# Patient Record
Sex: Female | Born: 1935 | Race: White | Hispanic: No | Marital: Married | Smoking: Never smoker
Health system: Southern US, Community
[De-identification: ages and names within clinical notes are randomized; demographics above are authoritative.]

## PROBLEM LIST (undated history)

## (undated) DIAGNOSIS — K219 Gastro-esophageal reflux disease without esophagitis: Secondary | ICD-10-CM

## (undated) DIAGNOSIS — F0390 Unspecified dementia without behavioral disturbance: Secondary | ICD-10-CM

## (undated) DIAGNOSIS — E079 Disorder of thyroid, unspecified: Secondary | ICD-10-CM

## (undated) DIAGNOSIS — I712 Thoracic aortic aneurysm, without rupture, unspecified: Secondary | ICD-10-CM

## (undated) DIAGNOSIS — I639 Cerebral infarction, unspecified: Secondary | ICD-10-CM

## (undated) DIAGNOSIS — E785 Hyperlipidemia, unspecified: Secondary | ICD-10-CM

## (undated) DIAGNOSIS — I10 Essential (primary) hypertension: Secondary | ICD-10-CM

## (undated) DIAGNOSIS — I4891 Unspecified atrial fibrillation: Secondary | ICD-10-CM

## (undated) DIAGNOSIS — I2510 Atherosclerotic heart disease of native coronary artery without angina pectoris: Secondary | ICD-10-CM

## (undated) DIAGNOSIS — M8580 Other specified disorders of bone density and structure, unspecified site: Secondary | ICD-10-CM

## (undated) DIAGNOSIS — D649 Anemia, unspecified: Secondary | ICD-10-CM

## (undated) HISTORY — DX: Gastro-esophageal reflux disease without esophagitis: K21.9

## (undated) HISTORY — PX: CHOLECYSTECTOMY: SHX55

## (undated) HISTORY — DX: Thoracic aortic aneurysm, without rupture, unspecified: I71.20

## (undated) HISTORY — DX: Cerebral infarction, unspecified: I63.9

## (undated) HISTORY — DX: Other specified disorders of bone density and structure, unspecified site: M85.80

## (undated) HISTORY — DX: Thoracic aortic aneurysm, without rupture: I71.2

## (undated) HISTORY — DX: Disorder of thyroid, unspecified: E07.9

## (undated) HISTORY — DX: Unspecified atrial fibrillation: I48.91

## (undated) HISTORY — DX: Anemia, unspecified: D64.9

## (undated) HISTORY — PX: THORACIC AORTIC ANEURYSM REPAIR: SHX799

## (undated) HISTORY — DX: Essential (primary) hypertension: I10

## (undated) HISTORY — DX: Hyperlipidemia, unspecified: E78.5

## (undated) HISTORY — DX: Atherosclerotic heart disease of native coronary artery without angina pectoris: I25.10

---
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
## 1969-08-03 HISTORY — PX: TUBAL LIGATION: SHX77

## 1977-08-03 HISTORY — PX: ELBOW SURGERY: SHX618

## 1997-10-16 ENCOUNTER — Inpatient Hospital Stay (HOSPITAL_COMMUNITY): Admission: AD | Admit: 1997-10-16 | Discharge: 1997-10-16 | Payer: Self-pay | Admitting: *Deleted

## 1997-11-16 ENCOUNTER — Other Ambulatory Visit: Admission: RE | Admit: 1997-11-16 | Discharge: 1997-11-16 | Payer: Self-pay | Admitting: *Deleted

## 1997-11-19 ENCOUNTER — Other Ambulatory Visit: Admission: RE | Admit: 1997-11-19 | Discharge: 1997-11-19 | Payer: Self-pay | Admitting: *Deleted

## 1997-12-17 ENCOUNTER — Other Ambulatory Visit: Admission: RE | Admit: 1997-12-17 | Discharge: 1997-12-17 | Payer: Self-pay | Admitting: *Deleted

## 1998-01-16 ENCOUNTER — Other Ambulatory Visit: Admission: RE | Admit: 1998-01-16 | Discharge: 1998-01-16 | Payer: Self-pay | Admitting: *Deleted

## 1998-01-29 ENCOUNTER — Other Ambulatory Visit: Admission: RE | Admit: 1998-01-29 | Discharge: 1998-01-29 | Payer: Self-pay | Admitting: *Deleted

## 1999-02-17 ENCOUNTER — Encounter: Payer: Self-pay | Admitting: *Deleted

## 1999-02-17 ENCOUNTER — Emergency Department (HOSPITAL_COMMUNITY): Admission: EM | Admit: 1999-02-17 | Discharge: 1999-02-17 | Payer: Self-pay | Admitting: Emergency Medicine

## 1999-04-16 ENCOUNTER — Other Ambulatory Visit: Admission: RE | Admit: 1999-04-16 | Discharge: 1999-04-16 | Payer: Self-pay | Admitting: *Deleted

## 2000-04-16 ENCOUNTER — Other Ambulatory Visit: Admission: RE | Admit: 2000-04-16 | Discharge: 2000-04-16 | Payer: Self-pay | Admitting: *Deleted

## 2001-10-03 ENCOUNTER — Other Ambulatory Visit: Admission: RE | Admit: 2001-10-03 | Discharge: 2001-10-03 | Payer: Self-pay | Admitting: *Deleted

## 2002-11-01 ENCOUNTER — Other Ambulatory Visit: Admission: RE | Admit: 2002-11-01 | Discharge: 2002-11-01 | Payer: Self-pay | Admitting: *Deleted

## 2003-02-23 ENCOUNTER — Ambulatory Visit (HOSPITAL_COMMUNITY): Admission: RE | Admit: 2003-02-23 | Discharge: 2003-02-23 | Payer: Self-pay | Admitting: Gastroenterology

## 2003-02-23 ENCOUNTER — Encounter (INDEPENDENT_AMBULATORY_CARE_PROVIDER_SITE_OTHER): Payer: Self-pay | Admitting: Specialist

## 2003-05-17 ENCOUNTER — Encounter: Payer: Self-pay | Admitting: Otolaryngology

## 2003-05-17 ENCOUNTER — Ambulatory Visit (HOSPITAL_COMMUNITY): Admission: RE | Admit: 2003-05-17 | Discharge: 2003-05-17 | Payer: Self-pay | Admitting: Otolaryngology

## 2003-05-30 ENCOUNTER — Ambulatory Visit (HOSPITAL_COMMUNITY): Admission: RE | Admit: 2003-05-30 | Discharge: 2003-05-30 | Payer: Self-pay | Admitting: Otolaryngology

## 2003-05-30 ENCOUNTER — Encounter (INDEPENDENT_AMBULATORY_CARE_PROVIDER_SITE_OTHER): Payer: Self-pay | Admitting: *Deleted

## 2005-03-31 ENCOUNTER — Other Ambulatory Visit: Admission: RE | Admit: 2005-03-31 | Discharge: 2005-03-31 | Payer: Self-pay | Admitting: *Deleted

## 2006-05-10 ENCOUNTER — Other Ambulatory Visit: Admission: RE | Admit: 2006-05-10 | Discharge: 2006-05-10 | Payer: Self-pay | Admitting: Obstetrics and Gynecology

## 2007-08-02 ENCOUNTER — Encounter: Payer: Self-pay | Admitting: Family Medicine

## 2008-08-04 LAB — HM COLONOSCOPY: HM Colonoscopy: NORMAL

## 2008-08-06 ENCOUNTER — Encounter: Payer: Self-pay | Admitting: Family Medicine

## 2008-08-13 ENCOUNTER — Encounter: Payer: Self-pay | Admitting: Family Medicine

## 2009-08-04 LAB — HM MAMMOGRAPHY: HM Mammogram: NORMAL

## 2009-08-07 ENCOUNTER — Encounter: Payer: Self-pay | Admitting: Family Medicine

## 2009-08-20 ENCOUNTER — Ambulatory Visit: Payer: Self-pay | Admitting: Family Medicine

## 2009-08-20 DIAGNOSIS — M899 Disorder of bone, unspecified: Secondary | ICD-10-CM | POA: Insufficient documentation

## 2009-08-20 DIAGNOSIS — E119 Type 2 diabetes mellitus without complications: Secondary | ICD-10-CM

## 2009-08-20 DIAGNOSIS — E785 Hyperlipidemia, unspecified: Secondary | ICD-10-CM

## 2009-08-20 DIAGNOSIS — M949 Disorder of cartilage, unspecified: Secondary | ICD-10-CM

## 2009-08-20 DIAGNOSIS — K219 Gastro-esophageal reflux disease without esophagitis: Secondary | ICD-10-CM | POA: Insufficient documentation

## 2009-08-20 DIAGNOSIS — E039 Hypothyroidism, unspecified: Secondary | ICD-10-CM

## 2009-08-20 DIAGNOSIS — I10 Essential (primary) hypertension: Secondary | ICD-10-CM

## 2009-08-20 LAB — CONVERTED CEMR LAB: Cholesterol, target level: 200 mg/dL

## 2009-08-21 ENCOUNTER — Telehealth: Payer: Self-pay | Admitting: Family Medicine

## 2009-08-21 ENCOUNTER — Ambulatory Visit: Payer: Self-pay | Admitting: Family Medicine

## 2009-08-21 LAB — CONVERTED CEMR LAB
AST: 26 units/L (ref 0–37)
Alkaline Phosphatase: 40 units/L (ref 39–117)
BUN: 20 mg/dL (ref 6–23)
CO2: 30 meq/L (ref 19–32)
Chloride: 104 meq/L (ref 96–112)
Creatinine, Ser: 0.9 mg/dL (ref 0.4–1.2)
Direct LDL: 103.6 mg/dL
Potassium: 4 meq/L (ref 3.5–5.1)
TSH: 1.29 microintl units/mL (ref 0.35–5.50)
Total Bilirubin: 0.8 mg/dL (ref 0.3–1.2)
Total CHOL/HDL Ratio: 6

## 2009-09-16 ENCOUNTER — Telehealth: Payer: Self-pay | Admitting: Family Medicine

## 2010-03-20 ENCOUNTER — Inpatient Hospital Stay (HOSPITAL_COMMUNITY): Admission: EM | Admit: 2010-03-20 | Discharge: 2010-03-23 | Payer: Self-pay | Admitting: Emergency Medicine

## 2010-03-20 ENCOUNTER — Encounter (INDEPENDENT_AMBULATORY_CARE_PROVIDER_SITE_OTHER): Payer: Self-pay | Admitting: Cardiovascular Disease

## 2010-03-21 ENCOUNTER — Telehealth: Payer: Self-pay | Admitting: Family Medicine

## 2010-04-17 ENCOUNTER — Encounter (HOSPITAL_COMMUNITY): Admission: RE | Admit: 2010-04-17 | Discharge: 2010-05-02 | Payer: Self-pay | Admitting: Cardiovascular Disease

## 2010-04-18 ENCOUNTER — Telehealth: Payer: Self-pay | Admitting: Family Medicine

## 2010-05-01 ENCOUNTER — Ambulatory Visit: Payer: Self-pay | Admitting: Family Medicine

## 2010-05-01 DIAGNOSIS — Z951 Presence of aortocoronary bypass graft: Secondary | ICD-10-CM

## 2010-05-01 LAB — CONVERTED CEMR LAB: Blood Glucose, Fingerstick: 166

## 2010-05-05 ENCOUNTER — Telehealth: Payer: Self-pay | Admitting: Family Medicine

## 2010-05-20 ENCOUNTER — Ambulatory Visit: Payer: Self-pay | Admitting: Surgery

## 2010-06-04 ENCOUNTER — Encounter: Payer: Self-pay | Admitting: Family Medicine

## 2010-06-11 ENCOUNTER — Encounter: Payer: Self-pay | Admitting: Family Medicine

## 2010-06-17 ENCOUNTER — Inpatient Hospital Stay (HOSPITAL_COMMUNITY)
Admission: EM | Admit: 2010-06-17 | Discharge: 2010-07-15 | Payer: Self-pay | Source: Home / Self Care | Attending: Surgery | Admitting: Surgery

## 2010-06-18 ENCOUNTER — Ambulatory Visit: Payer: Self-pay | Admitting: Surgery

## 2010-06-18 HISTORY — PX: CORONARY ARTERY BYPASS GRAFT: SHX141

## 2010-06-18 HISTORY — PX: CARDIAC CATHETERIZATION: SHX172

## 2010-06-19 ENCOUNTER — Encounter: Payer: Self-pay | Admitting: Surgery

## 2010-06-27 ENCOUNTER — Encounter: Payer: Self-pay | Admitting: Cardiothoracic Surgery

## 2010-07-04 DIAGNOSIS — F329 Major depressive disorder, single episode, unspecified: Secondary | ICD-10-CM

## 2010-07-18 ENCOUNTER — Inpatient Hospital Stay (HOSPITAL_COMMUNITY)
Admission: EM | Admit: 2010-07-18 | Discharge: 2010-08-01 | Payer: Self-pay | Source: Home / Self Care | Attending: Internal Medicine | Admitting: Internal Medicine

## 2010-07-24 ENCOUNTER — Encounter: Payer: Self-pay | Admitting: Pulmonary Disease

## 2010-07-29 ENCOUNTER — Ambulatory Visit: Payer: Self-pay | Admitting: Surgery

## 2010-08-05 ENCOUNTER — Emergency Department (HOSPITAL_COMMUNITY)
Admission: EM | Admit: 2010-08-05 | Discharge: 2010-08-05 | Payer: Self-pay | Source: Home / Self Care | Admitting: Emergency Medicine

## 2010-08-06 ENCOUNTER — Inpatient Hospital Stay (HOSPITAL_COMMUNITY)
Admission: EM | Admit: 2010-08-06 | Discharge: 2010-08-11 | Disposition: A | Discharge status: Other Acute Inpatient Hospital | Payer: Self-pay | Source: Home / Self Care | Attending: Internal Medicine | Admitting: Internal Medicine

## 2010-08-06 LAB — COMPREHENSIVE METABOLIC PANEL
ALT: 8 U/L (ref 0–35)
AST: 18 U/L (ref 0–37)
Albumin: 2.2 g/dL — ABNORMAL LOW (ref 3.5–5.2)
Alkaline Phosphatase: 79 U/L (ref 39–117)
BUN: 20 mg/dL (ref 6–23)
CO2: 25 mEq/L (ref 19–32)
Calcium: 8.5 mg/dL (ref 8.4–10.5)
Chloride: 101 mEq/L (ref 96–112)
Creatinine, Ser: 1.53 mg/dL — ABNORMAL HIGH (ref 0.4–1.2)
GFR calc Af Amer: 40 mL/min — ABNORMAL LOW (ref >60)
GFR calc non Af Amer: 33 mL/min — ABNORMAL LOW (ref >60)
Glucose, Bld: 62 mg/dL — ABNORMAL LOW (ref 70–99)
Potassium: 4.3 mEq/L (ref 3.5–5.1)
Sodium: 135 mEq/L (ref 135–145)
Total Bilirubin: 0.5 mg/dL (ref 0.3–1.2)
Total Protein: 7.1 g/dL (ref 6.0–8.3)

## 2010-08-06 LAB — DIFFERENTIAL
Basophils Absolute: 0 10*3/uL (ref 0.0–0.1)
Basophils Relative: 0 % (ref 0–1)
Eosinophils Absolute: 0.3 10*3/uL (ref 0.0–0.7)
Eosinophils Relative: 2 % (ref 0–5)
Lymphocytes Relative: 12 % (ref 12–46)
Lymphs Abs: 1.8 10*3/uL (ref 0.7–4.0)
Monocytes Absolute: 1.9 10*3/uL — ABNORMAL HIGH (ref 0.1–1.0)
Monocytes Relative: 12 % (ref 3–12)
Neutro Abs: 11.7 10*3/uL — ABNORMAL HIGH (ref 1.7–7.7)
Neutrophils Relative %: 75 % (ref 43–77)

## 2010-08-06 LAB — URINALYSIS, ROUTINE W REFLEX MICROSCOPIC
Bilirubin Urine: NEGATIVE
Hemoglobin, Urine: NEGATIVE
Ketones, ur: NEGATIVE mg/dL
Nitrite: NEGATIVE
Protein, ur: NEGATIVE mg/dL
Specific Gravity, Urine: 1.013 (ref 1.005–1.030)
Urine Glucose, Fasting: NEGATIVE mg/dL
Urobilinogen, UA: 0.2 mg/dL (ref 0.0–1.0)
pH: 5.5 (ref 5.0–8.0)

## 2010-08-06 LAB — CBC
HCT: 25.2 % — ABNORMAL LOW (ref 36.0–46.0)
HCT: 28.1 % — ABNORMAL LOW (ref 36.0–46.0)
Hemoglobin: 7.8 g/dL — ABNORMAL LOW (ref 12.0–15.0)
Hemoglobin: 9.2 g/dL — ABNORMAL LOW (ref 12.0–15.0)
MCH: 25.7 pg — ABNORMAL LOW (ref 26.0–34.0)
MCH: 27 pg (ref 26.0–34.0)
MCHC: 31 g/dL (ref 30.0–36.0)
MCHC: 32.7 g/dL (ref 30.0–36.0)
MCV: 82.9 fL (ref 78.0–100.0)
MCV: 82.4 fL (ref 78.0–100.0)
Platelets: 465 10*3/uL — ABNORMAL HIGH (ref 150–400)
Platelets: 363 10*3/uL (ref 150–400)
RBC: 3.04 MIL/uL — ABNORMAL LOW (ref 3.87–5.11)
RBC: 3.41 MIL/uL — ABNORMAL LOW (ref 3.87–5.11)
RDW: 18.7 % — ABNORMAL HIGH (ref 11.5–15.5)
RDW: 17.7 % — ABNORMAL HIGH (ref 11.5–15.5)
WBC: 15.7 10*3/uL — ABNORMAL HIGH (ref 4.0–10.5)
WBC: 10 10*3/uL (ref 4.0–10.5)

## 2010-08-06 LAB — GLUCOSE, CAPILLARY
Glucose-Capillary: 103 mg/dL — ABNORMAL HIGH (ref 70–99)
Glucose-Capillary: 43 mg/dL — CL (ref 70–99)
Glucose-Capillary: 95 mg/dL (ref 70–99)
Glucose-Capillary: 63 mg/dL — ABNORMAL LOW (ref 70–99)
Glucose-Capillary: 64 mg/dL — ABNORMAL LOW (ref 70–99)
Glucose-Capillary: 110 mg/dL — ABNORMAL HIGH (ref 70–99)
Glucose-Capillary: 69 mg/dL — ABNORMAL LOW (ref 70–99)
Glucose-Capillary: 82 mg/dL (ref 70–99)
Glucose-Capillary: 94 mg/dL (ref 70–99)
Glucose-Capillary: 104 mg/dL — ABNORMAL HIGH (ref 70–99)
Glucose-Capillary: 143 mg/dL — ABNORMAL HIGH (ref 70–99)

## 2010-08-06 LAB — ABO/RH: ABO/RH(D): A POS

## 2010-08-06 LAB — LIPASE, BLOOD: Lipase: 56 U/L (ref 11–59)

## 2010-08-06 LAB — URINE MICROSCOPIC-ADD ON

## 2010-08-07 ENCOUNTER — Ambulatory Visit (HOSPITAL_COMMUNITY): Admission: RE | Admit: 2010-08-07 | Payer: Self-pay | Source: Home / Self Care | Admitting: Internal Medicine

## 2010-08-07 LAB — COMPREHENSIVE METABOLIC PANEL
ALT: 10 U/L (ref 0–35)
AST: 16 U/L (ref 0–37)
Albumin: 2.1 g/dL — ABNORMAL LOW (ref 3.5–5.2)
Alkaline Phosphatase: 76 U/L (ref 39–117)
BUN: 23 mg/dL (ref 6–23)
CO2: 22 mEq/L (ref 19–32)
Calcium: 8.5 mg/dL (ref 8.4–10.5)
Chloride: 101 mEq/L (ref 96–112)
Creatinine, Ser: 1.59 mg/dL — ABNORMAL HIGH (ref 0.4–1.2)
GFR calc Af Amer: 39 mL/min — ABNORMAL LOW (ref >60)
GFR calc non Af Amer: 32 mL/min — ABNORMAL LOW (ref >60)
Glucose, Bld: 382 mg/dL — ABNORMAL HIGH (ref 70–99)
Potassium: 5.1 mEq/L (ref 3.5–5.1)
Sodium: 132 mEq/L — ABNORMAL LOW (ref 135–145)
Total Bilirubin: 0.7 mg/dL (ref 0.3–1.2)
Total Protein: 6.9 g/dL (ref 6.0–8.3)

## 2010-08-07 LAB — GLUCOSE, CAPILLARY
Glucose-Capillary: 322 mg/dL — ABNORMAL HIGH (ref 70–99)
Glucose-Capillary: 438 mg/dL — ABNORMAL HIGH (ref 70–99)
Glucose-Capillary: 402 mg/dL — ABNORMAL HIGH (ref 70–99)
Glucose-Capillary: 504 mg/dL — ABNORMAL HIGH (ref 70–99)
Glucose-Capillary: 533 mg/dL — ABNORMAL HIGH (ref 70–99)

## 2010-08-07 LAB — CROSSMATCH
ABO/RH(D): A POS
Antibody Screen: NEGATIVE
Unit division: 0
Unit division: 0

## 2010-08-07 LAB — CBC
HCT: 29.8 % — ABNORMAL LOW (ref 36.0–46.0)
Hemoglobin: 9.4 g/dL — ABNORMAL LOW (ref 12.0–15.0)
MCH: 26.2 pg (ref 26.0–34.0)
MCHC: 31.5 g/dL (ref 30.0–36.0)
MCV: 83 fL (ref 78.0–100.0)
Platelets: 394 10*3/uL (ref 150–400)
RBC: 3.59 MIL/uL — ABNORMAL LOW (ref 3.87–5.11)
RDW: 17.8 % — ABNORMAL HIGH (ref 11.5–15.5)
WBC: 7.7 10*3/uL (ref 4.0–10.5)

## 2010-08-07 LAB — DIFFERENTIAL
Basophils Absolute: 0 10*3/uL (ref 0.0–0.1)
Basophils Relative: 0 % (ref 0–1)
Eosinophils Absolute: 0 10*3/uL (ref 0.0–0.7)
Eosinophils Relative: 0 % (ref 0–5)
Lymphocytes Relative: 7 % — ABNORMAL LOW (ref 12–46)
Lymphs Abs: 0.5 10*3/uL — ABNORMAL LOW (ref 0.7–4.0)
Monocytes Absolute: 0.3 10*3/uL (ref 0.1–1.0)
Monocytes Relative: 4 % (ref 3–12)
Neutro Abs: 6.9 10*3/uL (ref 1.7–7.7)
Neutrophils Relative %: 90 % — ABNORMAL HIGH (ref 43–77)

## 2010-08-07 LAB — CORTISOL: Cortisol, Plasma: 59.9 ug/dL

## 2010-08-08 LAB — CBC
HCT: 29.4 % — ABNORMAL LOW (ref 36.0–46.0)
Hemoglobin: 9.4 g/dL — ABNORMAL LOW (ref 12.0–15.0)
MCH: 26.8 pg (ref 26.0–34.0)
MCHC: 32 g/dL (ref 30.0–36.0)
MCV: 83.8 fL (ref 78.0–100.0)
Platelets: 427 10*3/uL — ABNORMAL HIGH (ref 150–400)
RBC: 3.51 MIL/uL — ABNORMAL LOW (ref 3.87–5.11)
RDW: 17.4 % — ABNORMAL HIGH (ref 11.5–15.5)
WBC: 14.4 10*3/uL — ABNORMAL HIGH (ref 4.0–10.5)

## 2010-08-08 LAB — COMPREHENSIVE METABOLIC PANEL
ALT: 9 U/L (ref 0–35)
AST: 16 U/L (ref 0–37)
Albumin: 2.2 g/dL — ABNORMAL LOW (ref 3.5–5.2)
Alkaline Phosphatase: 63 U/L (ref 39–117)
BUN: 30 mg/dL — ABNORMAL HIGH (ref 6–23)
CO2: 25 mEq/L (ref 19–32)
Calcium: 9 mg/dL (ref 8.4–10.5)
Chloride: 100 mEq/L (ref 96–112)
Creatinine, Ser: 1.62 mg/dL — ABNORMAL HIGH (ref 0.4–1.2)
GFR calc Af Amer: 38 mL/min — ABNORMAL LOW (ref >60)
GFR calc non Af Amer: 31 mL/min — ABNORMAL LOW (ref >60)
Glucose, Bld: 259 mg/dL — ABNORMAL HIGH (ref 70–99)
Potassium: 5.2 mEq/L — ABNORMAL HIGH (ref 3.5–5.1)
Sodium: 133 mEq/L — ABNORMAL LOW (ref 135–145)
Total Bilirubin: 0.3 mg/dL (ref 0.3–1.2)
Total Protein: 7 g/dL (ref 6.0–8.3)

## 2010-08-08 LAB — GLUCOSE, CAPILLARY
Glucose-Capillary: 151 mg/dL — ABNORMAL HIGH (ref 70–99)
Glucose-Capillary: 173 mg/dL — ABNORMAL HIGH (ref 70–99)
Glucose-Capillary: 354 mg/dL — ABNORMAL HIGH (ref 70–99)

## 2010-08-18 LAB — BASIC METABOLIC PANEL
BUN: 43 mg/dL — ABNORMAL HIGH (ref 6–23)
BUN: 38 mg/dL — ABNORMAL HIGH (ref 6–23)
BUN: 32 mg/dL — ABNORMAL HIGH (ref 6–23)
CO2: 27 mEq/L (ref 19–32)
CO2: 28 mEq/L (ref 19–32)
CO2: 27 mEq/L (ref 19–32)
Calcium: 9 mg/dL (ref 8.4–10.5)
Calcium: 9.2 mg/dL (ref 8.4–10.5)
Calcium: 9 mg/dL (ref 8.4–10.5)
Chloride: 100 mEq/L (ref 96–112)
Chloride: 102 mEq/L (ref 96–112)
Chloride: 101 mEq/L (ref 96–112)
Creatinine, Ser: 1.95 mg/dL — ABNORMAL HIGH (ref 0.4–1.2)
Creatinine, Ser: 1.69 mg/dL — ABNORMAL HIGH (ref 0.4–1.2)
Creatinine, Ser: 1.53 mg/dL — ABNORMAL HIGH (ref 0.4–1.2)
GFR calc Af Amer: 31 mL/min — ABNORMAL LOW (ref >60)
GFR calc Af Amer: 36 mL/min — ABNORMAL LOW (ref >60)
GFR calc Af Amer: 40 mL/min — ABNORMAL LOW (ref >60)
GFR calc non Af Amer: 25 mL/min — ABNORMAL LOW (ref >60)
GFR calc non Af Amer: 30 mL/min — ABNORMAL LOW (ref >60)
GFR calc non Af Amer: 33 mL/min — ABNORMAL LOW (ref >60)
Glucose, Bld: 93 mg/dL (ref 70–99)
Glucose, Bld: 98 mg/dL (ref 70–99)
Glucose, Bld: 97 mg/dL (ref 70–99)
Potassium: 5.1 mEq/L (ref 3.5–5.1)
Potassium: 4.1 mEq/L (ref 3.5–5.1)
Potassium: 3.9 mEq/L (ref 3.5–5.1)
Sodium: 137 mEq/L (ref 135–145)
Sodium: 139 mEq/L (ref 135–145)
Sodium: 138 mEq/L (ref 135–145)

## 2010-08-18 LAB — CBC
HCT: 30.7 % — ABNORMAL LOW (ref 36.0–46.0)
HCT: 31.4 % — ABNORMAL LOW (ref 36.0–46.0)
HCT: 30.9 % — ABNORMAL LOW (ref 36.0–46.0)
Hemoglobin: 9.4 g/dL — ABNORMAL LOW (ref 12.0–15.0)
Hemoglobin: 9.8 g/dL — ABNORMAL LOW (ref 12.0–15.0)
Hemoglobin: 9.6 g/dL — ABNORMAL LOW (ref 12.0–15.0)
MCH: 25.7 pg — ABNORMAL LOW (ref 26.0–34.0)
MCH: 26.4 pg (ref 26.0–34.0)
MCH: 25.9 pg — ABNORMAL LOW (ref 26.0–34.0)
MCHC: 30.6 g/dL (ref 30.0–36.0)
MCHC: 31.2 g/dL (ref 30.0–36.0)
MCHC: 31.1 g/dL (ref 30.0–36.0)
MCV: 83.9 fL (ref 78.0–100.0)
MCV: 84.6 fL (ref 78.0–100.0)
MCV: 83.5 fL (ref 78.0–100.0)
Platelets: 429 10*3/uL — ABNORMAL HIGH (ref 150–400)
Platelets: 406 10*3/uL — ABNORMAL HIGH (ref 150–400)
Platelets: 388 10*3/uL (ref 150–400)
RBC: 3.66 MIL/uL — ABNORMAL LOW (ref 3.87–5.11)
RBC: 3.71 MIL/uL — ABNORMAL LOW (ref 3.87–5.11)
RBC: 3.7 MIL/uL — ABNORMAL LOW (ref 3.87–5.11)
RDW: 17.9 % — ABNORMAL HIGH (ref 11.5–15.5)
RDW: 17.9 % — ABNORMAL HIGH (ref 11.5–15.5)
RDW: 17.9 % — ABNORMAL HIGH (ref 11.5–15.5)
WBC: 12.2 10*3/uL — ABNORMAL HIGH (ref 4.0–10.5)
WBC: 10.7 10*3/uL — ABNORMAL HIGH (ref 4.0–10.5)
WBC: 8.2 10*3/uL (ref 4.0–10.5)

## 2010-08-18 LAB — GLUCOSE, CAPILLARY
Glucose-Capillary: 158 mg/dL — ABNORMAL HIGH (ref 70–99)
Glucose-Capillary: 92 mg/dL (ref 70–99)
Glucose-Capillary: 158 mg/dL — ABNORMAL HIGH (ref 70–99)
Glucose-Capillary: 123 mg/dL — ABNORMAL HIGH (ref 70–99)
Glucose-Capillary: 93 mg/dL (ref 70–99)
Glucose-Capillary: 170 mg/dL — ABNORMAL HIGH (ref 70–99)
Glucose-Capillary: 140 mg/dL — ABNORMAL HIGH (ref 70–99)
Glucose-Capillary: 103 mg/dL — ABNORMAL HIGH (ref 70–99)
Glucose-Capillary: 217 mg/dL — ABNORMAL HIGH (ref 70–99)

## 2010-08-18 LAB — CULTURE, BLOOD (ROUTINE X 2)
Culture  Setup Time: 201201050128
Culture  Setup Time: 201201050127
Culture: NO GROWTH
Culture: NO GROWTH

## 2010-08-18 LAB — LIPASE, BLOOD: Lipase: 81 U/L — ABNORMAL HIGH (ref 11–59)

## 2010-08-20 ENCOUNTER — Ambulatory Visit
Admission: RE | Admit: 2010-08-20 | Discharge: 2010-08-20 | Payer: Self-pay | Source: Home / Self Care | Attending: Internal Medicine | Admitting: Internal Medicine

## 2010-08-20 DIAGNOSIS — J90 Pleural effusion, not elsewhere classified: Secondary | ICD-10-CM | POA: Insufficient documentation

## 2010-08-21 ENCOUNTER — Encounter: Payer: Self-pay | Admitting: Internal Medicine

## 2010-08-22 ENCOUNTER — Encounter: Payer: Self-pay | Admitting: Family Medicine

## 2010-08-24 ENCOUNTER — Encounter: Payer: Self-pay | Admitting: Emergency Medicine

## 2010-08-24 ENCOUNTER — Encounter: Payer: Self-pay | Admitting: Surgery

## 2010-08-28 LAB — URINE CULTURE
Colony Count: NO GROWTH
Culture  Setup Time: 201201041059
Culture: NO GROWTH

## 2010-08-28 LAB — SAMPLE TO BLOOD BANK

## 2010-08-29 NOTE — H&P (Signed)
NAMELALITA, EBEL                ACCOUNT NO.:  [id number]  MEDICAL RECORD NO.:  [id number]          PATIENT TYPE:  OBV  LOCATION:  0102                         FACILITY:  [facility]  PHYSICIAN:  Massie Maroon, MD        DATE OF BIRTH:  March 03, 1936  DATE OF ADMISSION:  08/06/2010 DATE OF DISCHARGE:                             HISTORY & PHYSICAL   CARDIOLOGIST:  Dr. Nanetta Batty, [facility] and Dr. Rexanne Mano.  CHIEF COMPLAINT:  I am hypoglycemic.  HISTORY OF PRESENT ILLNESS:  This is a 75 year old female with a history of diabetes apparently was noted to be hypoglycemic 2 days ago at that point in time Amaryl was discontinued.  It was thought to be secondary to Amaryl and Avelox drug interaction by the ED.  She apparently yesterday also had hypoglycemia which prompted her to return to the ED. She is now being admitted for hypoglycemia.  The patient also noted that she had some right flank pain or discomfort as well, but denies any fever or chills, nausea, vomiting, cough, chest pain, or shortness of breath.  She was noted to have a fungal UTI and imaging is still pending to rule out pyelonephritis.  The patient will be admitted for hypoglycemia and right flank pain as stated above.  PAST MEDICAL HISTORY: 1. Right lower lobe pneumonia, right-sided exudative pleural effusion,     diarrhea, Clostridium difficile negative, CABG x3 vessel, and CAD     status post stent to the LAD. 2. Recent history of acute cholecystitis with cholecystectomy. 3. Aortic sclerosis. 4. Atrial fibrillation. 5. Diabetes type 2. 6. Hypertension. 7. History of acute renal failure due to nephrotoxic effects of     vancomycin. 8. Protein calorie malnutrition. 9. Severe deconditioning. 10.Iron deficiency anemia. 11.Hypothyroidism. 12.History of Bell's palsy. 13.History of colitis.  PAST SURGICAL HISTORY:  On February 23, 2003, EGD minimal hiatal hernia with a Schatzki's ring, on February 23, 2003, colonoscopy one polyp, inflammatory polyp, and a polypoid fragment of benign colonic mucosa with reactive lymphoid aggregate on March 30, 2010, left main normal, LAD had 95% stenosis after the first small to medium sized diagonal branch and the left circumflex had an 80% focal stenosis in the AV groove just past the second obtuse marginal branch, right coronary artery dominant with a low anterior takeoff and free of significant disease.  The patient was stented with a 2.0 x 18 mini-vision bare metal stent in the proximal LAD.  On June 18, 2010, cardiac catheterization with 95% in-stent restenosis of the LAD stent.  On June 20, 2010, CABG x3 using LIMA to LAD with SVG to diagonal branch of the LAD and a second obtuse marginal branch of the left circumflex coronary artery.  Resection and grafting of ascending aortic aneurysm using a 28 mm supracoronary Hemashield tube graft utilizing deep hypothermic circulatory arrest and on June 29, 2010, cholecystostomy.  SOCIAL HISTORY:  The patient is a retired Engineer, civil (consulting).  She does not smoke or drink.  FAMILY HISTORY:  Positive for coronary artery disease.  Her father had CABG x2.  Brother died at age 43  with a heart attack.  There is no history of aneurysm in the family.  SOCIAL HISTORY:  She has two adult children, both nurses.  The daughter is a Publishing rights manager and the son is a Engineer, civil (consulting).  ALLERGIES: 1. IODINE. 2. NEURONTIN. 3. SHELLFISH.  MEDICATIONS: 1. Enteric-coated aspirin 81 mg p.o. daily. 2. Zofran 4 mg p.o. q.6 h. p.r.n. nausea. 3. Tylenol 650 mg p.o. q.6 h. p.r.n. 4. Xanax 0.5 mg p.o. q.h.s. 5. Atorvastatin 20 mg p.o. daily. 6. Carvedilol 12.5 mg p.o. b.i.d. 7. Valsartan 160 mg p.o. daily. 8. Fish oil 1000 mg p.o. daily. 9. Folic acid 1 mg p.o. daily. 10.Furosemide 40 mg p.o. daily. 11.Potassium chloride 20 mEq p.o. daily. 12.Lexapro 5 mg p.o. daily. 13.Multivitamin one p.o. daily. 14.Sublingual nitroglycerin  0.4 mg p.o. every 5 minute p.r.n. chest     pain. 15.Nu-Iron 150 mg p.o. daily. 16.Protonix 40 mg p.o. daily. 17.Levothyroxine 112 mcg one half p.o. daily. 18.Tramadol 50 mg one to two p.o. q.4 h. p.r.n. pain. 19.Fenofibrate 145 mg p.o. daily. 20.Vitamin C 500 mg p.o. b.i.d. 21.Vitamin D 2000 international units p.o. daily.  PHYSICAL EXAMINATION:  VITAL SIGNS:  Temperature 97.7, pulse 79, blood pressure 113/68, and pulse oximetry is 95% on room air. HEENT:  Anicteric. NECK:  No JVD. HEART:  Regular rate and rhythm.  S1 and S2. LUNGS:  No crackles.  No wheezes. ABDOMEN:  Soft, nontender, and nondistended.  Positive bowel sounds. EXTREMITIES:  No cyanosis, clubbing, or edema. SKIN:  No CVA tenderness, but she has had (Dilaudid) lymph nodes.  No adenopathy. NEUROLOGIC EXAM:  Nonfocal.  LABORATORY DATA:  Lipase 56, sodium 135, potassium 4.3, BUN 20, creatinine 1.53, AST 18, and ALT 8.  WBC 15.7, hemoglobin 7.8, and platelet count 465.  Urinalysis shows WBCs too numerous to count, many yeast.  ASSESSMENT AND PLAN: 1. Urinary tract infection/? pyelonephritis:  Treated with Diflucan     200 mg IV daily.  CT scan of the abdomen and pelvis is still     pending as ordered by the emergency room. 2. Hypoglycemia:  This is probably due to drug interaction between the     Amaryl and Avelox.  The patient will be placed on a D5 one half     normal saline at 30 mL per hour and we will titrate this up or if     it is needed to prevent her from becoming hypoglycemic. 3. Anemia:  Typing and cross 2 units packed red blood cells.     Premedicate with Tylenol and Benadryl and we will transfuse each     unit over 3 hours with Lasix 20 mg IV between units. 4. Hypothyroidism.  Continue Synthroid. 5. Coronary artery disease.  Continue Coreg, Diovan, Lipitor, and     fenofibrate. 6. Depression:  Continue Lexapro.     Massie Maroon, MD     JYK/MEDQ  D:  08/06/2010  T:  08/06/2010  Job:   [id number]  cc:   Evelena Peat, M.D.  Electronically Signed by Pearson Grippe MD on 08/29/2010 11:58:51 PM

## 2010-09-04 ENCOUNTER — Ambulatory Visit (HOSPITAL_COMMUNITY)
Admission: RE | Admit: 2010-09-04 | Discharge: 2010-09-04 | Disposition: A | Discharge status: Home / Self Care | Payer: Medicare Other | Source: Ambulatory Visit | Attending: General Surgery | Admitting: General Surgery

## 2010-09-04 ENCOUNTER — Encounter (HOSPITAL_COMMUNITY): Payer: Medicare Other

## 2010-09-04 ENCOUNTER — Other Ambulatory Visit (HOSPITAL_COMMUNITY): Payer: Self-pay | Admitting: General Surgery

## 2010-09-04 DIAGNOSIS — Z951 Presence of aortocoronary bypass graft: Secondary | ICD-10-CM | POA: Insufficient documentation

## 2010-09-04 DIAGNOSIS — I517 Cardiomegaly: Secondary | ICD-10-CM | POA: Insufficient documentation

## 2010-09-04 DIAGNOSIS — K8010 Calculus of gallbladder with chronic cholecystitis without obstruction: Secondary | ICD-10-CM | POA: Insufficient documentation

## 2010-09-04 DIAGNOSIS — K819 Cholecystitis, unspecified: Secondary | ICD-10-CM

## 2010-09-04 DIAGNOSIS — Z01812 Encounter for preprocedural laboratory examination: Secondary | ICD-10-CM | POA: Insufficient documentation

## 2010-09-04 DIAGNOSIS — Z01818 Encounter for other preprocedural examination: Secondary | ICD-10-CM | POA: Insufficient documentation

## 2010-09-04 LAB — DIFFERENTIAL
Basophils Absolute: 0 10*3/uL (ref 0.0–0.1)
Eosinophils Absolute: 0.2 10*3/uL (ref 0.0–0.7)
Eosinophils Relative: 3 % (ref 0–5)
Lymphocytes Relative: 24 % (ref 12–46)
Monocytes Absolute: 0.9 10*3/uL (ref 0.1–1.0)

## 2010-09-04 LAB — COMPREHENSIVE METABOLIC PANEL
ALT: 14 U/L (ref 0–35)
AST: 21 U/L (ref 0–37)
Alkaline Phosphatase: 136 U/L — ABNORMAL HIGH (ref 39–117)
CO2: 26 mEq/L (ref 19–32)
Chloride: 99 mEq/L (ref 96–112)
GFR calc Af Amer: >60 mL/min (ref >60)
GFR calc non Af Amer: 59 mL/min — ABNORMAL LOW (ref >60)
Glucose, Bld: 184 mg/dL — ABNORMAL HIGH (ref 70–99)
Potassium: 4.6 mEq/L (ref 3.5–5.1)
Sodium: 133 mEq/L — ABNORMAL LOW (ref 135–145)
Total Bilirubin: 0.5 mg/dL (ref 0.3–1.2)

## 2010-09-04 LAB — CBC
HCT: 32.1 % — ABNORMAL LOW (ref 36.0–46.0)
MCHC: 33 g/dL (ref 30.0–36.0)
MCV: 82.7 fL (ref 78.0–100.0)
Platelets: 294 10*3/uL (ref 150–400)
RDW: 18.9 % — ABNORMAL HIGH (ref 11.5–15.5)
WBC: 6.3 10*3/uL (ref 4.0–10.5)

## 2010-09-04 LAB — URINALYSIS, ROUTINE W REFLEX MICROSCOPIC
Bilirubin Urine: NEGATIVE
Hgb urine dipstick: NEGATIVE
Nitrite: NEGATIVE
Protein, ur: NEGATIVE mg/dL
Urobilinogen, UA: 0.2 mg/dL (ref 0.0–1.0)

## 2010-09-04 NOTE — Assessment & Plan Note (Signed)
Summary: FUP PER NANCY//CCM   Vital Signs:  Patient profile:   75 year old female Menstrual status:  postmenopausal Weight:      191 pounds Temp:     98.0 degrees F oral BP sitting:   120 / 60  (left arm) Cuff size:   regular  Vitals Entered By: Sid Falcon LPN (May 01, 2010 2:38 PM) CBG Result 166   History of Present Illness: Patient seen for hospital followup. Patient was admitted in August with some chest discomfort, nausea, and vomiting. She had non-ST elevation MI with occlusion of left anterior descending. Underwent stenting of this vessel. Patient has some mild left ventricular dysfunction with ejection fraction 45%. Patient also noted to have 4.9 cm ascending thoracic aortic aneurysm with no evidence for dissection. Blood pressures controlled. A1c 7.3%. Thyroid normal range.  Patient had multiple medication changes made. These included addition of Lipitor, reduction of Cardizem from 360 to 180 mg. Addition of Plavix 75 mg daily. Discontinued Lasix and addition of HCTZ 25 mg daily. Patient also added Coreg 6.25 mg b.i.d. She is tolerating changes without difficulty.   Patient needs flu vaccine. Also no reported pneumonia vaccine several years, possibly before age 67.  Patient in cardiac rehab and that is going very well.  Has lost some weight and has made positive dietary changes.  Allergies: 1)  ! Iodine (Iodine) 2)  ! * Shell Fish 3)  Neurontin (Gabapentin)  Past History:  Past Surgical History: Last updated: 08/20/2009 Tubal ligation, 1971 Tonsillectomy, 1945 fracture left elbow 1979  Family History: Last updated: 08/20/2009 Family History Hypertension, mother Family History of Arthritis Heart disease, father Stroke, father Vanessa [place] osteoprosis, mother Alzheimers, father  Social History: Last updated: 08/20/2009 Retired Married Never Smoked Alcohol use-no Regular exercise-no  Risk Factors: Exercise: no (08/20/2009)  Risk Factors: Smoking  Status: never (08/20/2009)  Past Medical History: Diabetes GERD Hypertension Colon polyps hypothyroid CAD Non ST elev MI 8/11 with LAD stenting    Mild LV impairment EF 45% reomote hx A Fib PMH-FH-SH reviewed for relevance  Review of Systems  The patient denies anorexia, fever, chest pain, syncope, dyspnea on exertion, peripheral edema, prolonged cough, headaches, hemoptysis, abdominal pain, melena, hematochezia, severe indigestion/heartburn, and muscle weakness.    Physical Exam  General:  Well-developed,well-nourished,in no acute distress; alert,appropriate and cooperative throughout examination Neck:  No deformities, masses, or tenderness noted. Lungs:  Normal respiratory effort, chest expands symmetrically. Lungs are clear to auscultation, no crackles or wheezes. Heart:  normal rate, regular rhythm, and no gallop.   Extremities:  No clubbing, cyanosis, edema, or deformity noted with normal full range of motion of all joints.   Psych:  normally interactive, good eye contact, not anxious appearing, and not depressed appearing.     Impression & Recommendations:  Problem # 1:  CAD (ICD-414.00) pt has scheduled follow up with cardiology. The following medications were removed from the medication list:    Furosemide 40 Mg Tabs (Furosemide) ..... Once daily Her updated medication list for this problem includes:    Cardizem La 180 Mg Xr24h-tab (Diltiazem hcl coated beads) ..... One by mouth once daily    Coreg 6.25 Mg Tabs (Carvedilol) ..... One by mouth two times a day    Diovan 320 Mg Tabs (Valsartan) ..... Once daily    Hydrochlorothiazide 25 Mg Tabs (Hydrochlorothiazide) ..... One by mouth once daily    Plavix 75 Mg Tabs (Clopidogrel bisulfate)  Problem # 2:  HYPOTHYROIDISM (ICD-244.9) recent level normal in hosp. Her  updated medication list for this problem includes:    Synthroid 112 Mcg Tabs (Levothyroxine sodium) .Marland Kitchen... 1/2 tab daily  brand only per dr  burchette  Problem # 3:  DYSLIPIDEMIA (ICD-272.4) pt thinks she has scheduled appt to get f/u lipids and  she will confirm. Her updated medication list for this problem includes:    Tricor 145 Mg Tabs (Fenofibrate) ..... Once daily    Lipitor 10 Mg Tabs (Atorvastatin calcium) ..... One by mouth once daily  Problem # 4:  DIAB W/O COMP TYPE II/UNS NOT STATED UNCNTRL (ICD-250.00) Assessment: Unchanged  Her updated medication list for this problem includes:    Metformin Hcl 1000 Mg Tabs (Metformin hcl) .Marland Kitchen..Marland Kitchen Two times a day    Glimepiride 2 Mg Tabs (Glimepiride) ..... Once daily    Diovan 320 Mg Tabs (Valsartan) ..... Once daily  Problem # 5:  HYPERTENSION (ICD-401.9) excellent control today. The following medications were removed from the medication list:    Furosemide 40 Mg Tabs (Furosemide) ..... Once daily Her updated medication list for this problem includes:    Cardizem La 180 Mg Xr24h-tab (Diltiazem hcl coated beads) ..... One by mouth once daily    Coreg 6.25 Mg Tabs (Carvedilol) ..... One by mouth two times a day    Diovan 320 Mg Tabs (Valsartan) ..... Once daily    Hydrochlorothiazide 25 Mg Tabs (Hydrochlorothiazide) ..... One by mouth once daily  Complete Medication List: 1)  Sertraline Hcl 50 Mg Tabs (Sertraline hcl) .... Once daily 2)  Metformin Hcl 1000 Mg Tabs (Metformin hcl) .... Two times a day 3)  Glimepiride 2 Mg Tabs (Glimepiride) .... Once daily 4)  Tricor 145 Mg Tabs (Fenofibrate) .... Once daily 5)  Cardizem La 180 Mg Xr24h-tab (Diltiazem hcl coated beads) .... One by mouth once daily 6)  Coreg 6.25 Mg Tabs (Carvedilol) .... One by mouth two times a day 7)  Synthroid 112 Mcg Tabs (Levothyroxine sodium) .... 1/2 tab daily  brand only per dr burchette 8)  Potassium Chloride Cr 10 Meq Cr-caps (Potassium chloride) .... Once daily 9)  Diovan 320 Mg Tabs (Valsartan) .... Once daily 10)  Lipitor 10 Mg Tabs (Atorvastatin calcium) .... One by mouth once daily 11)   Hydrochlorothiazide 25 Mg Tabs (Hydrochlorothiazide) .... One by mouth once daily 12)  Plavix 75 Mg Tabs (Clopidogrel bisulfate)  Other Orders: Capillary Blood Glucose/CBG (16109) Pneumococcal Vaccine (60454) Admin 1st Vaccine (09811) Flu Vaccine 59yrs + MEDICARE PATIENTS (B1478) Administration Flu vaccine - MCR (G9562)  Patient Instructions: 1)  Take glimepiride at suppertime and and discontinue altogether if you continue to have repeated blood sugars below 65 range 2)  Make sure labs have been scheduled for lipid and hepatic panel within the next 2-3 weeks 3)  Please schedule a follow-up appointment in 3 months .      Immunizations Administered:  Pneumonia Vaccine:    Vaccine Type: Pneumovax    Site: right deltoid    Mfr: Merck    Dose: 0.5 ml    Route: IM    Given by: Sid Falcon LPN    Exp. Date: 10/20/2011    Lot #: [id number]    VIS given: 07/08/09 version given May 01, 2010.      Flu Vaccine Consent Questions     Do you have a history of severe allergic reactions to this vaccine? no    Any prior history of allergic reactions to egg and/or gelatin? no    Do you have a sensitivity to the  preservative Thimersol? no    Do you have a past history of Guillan-Barre Syndrome? no    Do you currently have an acute febrile illness? no    Have you ever had a severe reaction to latex? no    Vaccine information given and explained to patient? yes    Are you currently pregnant? no    Lot Number:[id number]   Exp Date:01/31/2011   Site Given  Left Deltoid IMlu

## 2010-09-04 NOTE — Progress Notes (Signed)
Summary: fyi  Phone Note Call from Patient Call back at Home Phone [phone]   Caller: Patient Call For: Evelena Peat MD Summary of Call: fyi pt stated liver and lipid panel has been order by another doc Initial call taken by: Heron Sabins,  May 05, 2010 10:47 AM

## 2010-09-04 NOTE — Progress Notes (Signed)
Summary: Synthroid vs Levothyroid concerns  Phone Note Call from Patient Call back at Work Phone [phone]   Caller: Patient---live call Call For: Lindsay Peat MD Reason for Call: Talk to Nurse Summary of Call: On Synthroid....has ? about about dosage. Wants [name] to return call. Initial call taken by: Warnell Forester,  September 16, 2009 10:01 AM  Follow-up for Phone Call        Pt received levothyroid from [facility], she has always received Brand Synthroid.  She is apprehensive to try the Levothyroid, not as effective, side effects, etc. Follow-up by: Sid Falcon LPN,  September 16, 2009 12:26 PM  Additional Follow-up for Phone Call Additional follow up Details #1::        OK to switch back to Synthroid. Additional Follow-up by: Lindsay Peat MD,  September 16, 2009 12:32 PM    Additional Follow-up for Phone Call Additional follow up Details #2::    Rx sent to [facility], brand only, pt informed Follow-up by: Sid Falcon LPN,  September 16, 2009 1:13 PM  New/Updated Medications: SYNTHROID 112 MCG TABS (LEVOTHYROXINE SODIUM) 1/2 tab daily  Brand only per Dr [name] Never Prescriptions: SYNTHROID 112 MCG TABS (LEVOTHYROXINE SODIUM) 1/2 tab daily  Brand only per Dr [name] Never  #45 x 3   Entered by:   Sid Falcon LPN   Authorized by:   Lindsay Peat MD   Signed by:   Sid Falcon LPN on 33/29/5188   Method used:   Electronically to        [facility]* (mail-order)             ,          Ph: [phone]       Fax: [phone]   RxID:   [id number]

## 2010-09-04 NOTE — Progress Notes (Signed)
Summary: [facility] pharmacy is [facility], all meds filled X 1 year  Phone Note Call from Patient   Caller: Patient Call For: Evelena Peat MD Summary of Call: VM to inform us her mail order for meds is [facility].  Added to pt EMR Initial call taken by: Sid Falcon LPN,  August 21, 2009 9:29 AM  Follow-up for Phone Call        Noted.  Refill all meds for 1 year. Follow-up by: Evelena Peat MD,  August 21, 2009 12:05 PM  Additional Follow-up for Phone Call Additional follow up Details #1::        Pt informed Additional Follow-up by: Sid Falcon LPN,  August 21, 2009 1:12 PM    Prescriptions: DIOVAN 320 MG TABS (VALSARTAN) once daily  #0 x 3   Entered by:   Sid Falcon LPN   Authorized by:   Evelena Peat MD   Signed by:   Sid Falcon LPN on 78/29/5621   Method used:   Electronically to        [facility]* (mail-order)             ,          Ph: [phone]       Fax: [phone]   RxID:   [id number] POTASSIUM CHLORIDE CR 10 MEQ CR-CAPS (POTASSIUM CHLORIDE) once daily  #90 x 3   Entered by:   Sid Falcon LPN   Authorized by:   Evelena Peat MD   Signed by:   Sid Falcon LPN on 47/42/5956   Method used:   Electronically to        [facility]* (mail-order)             ,          Ph: [phone]       Fax: [phone]   RxID:   [id number] SYNTHROID 112 MCG TABS (LEVOTHYROXINE SODIUM) 1/2 tab daily  #45 x 3   Entered by:   Sid Falcon LPN   Authorized by:   Evelena Peat MD   Signed by:   Sid Falcon LPN on 20/25/4270   Method used:   Electronically to        [facility]* (mail-order)             ,          Ph: [phone]       Fax: [phone]   RxID:   [id number] DIGOXIN 0.125 MG TABS (DIGOXIN) once daily  #90 x 3   Entered by:   Sid Falcon LPN   Authorized by:   Evelena Peat MD   Signed by:   Sid Falcon LPN on 09/38/1829   Method used:   Electronically to        [facility]* (mail-order)         ,          Ph: [phone]       Fax: [phone]   RxID:   [id number] FUROSEMIDE 40 MG TABS (FUROSEMIDE) once daily  #90 x 3   Entered by:   Sid Falcon LPN   Authorized by:   Evelena Peat MD   Signed by:   Sid Falcon LPN on 43/15/4008   Method used:   Electronically to        [facility]* (mail-order)             ,          Ph: [phone]  Fax: [phone]   RxID:   [id number] DILTIAZEM HCL ER BEADS 360 MG XR24H-CAP (DILTIAZEM HCL ER BEADS) once daily  #90 x 3   Entered by:   Sid Falcon LPN   Authorized by:   Evelena Peat MD   Signed by:   Sid Falcon LPN on 30/86/5784   Method used:   Electronically to        [facility]* (mail-order)             ,          Ph: [phone]       Fax: [phone]   RxID:   [id number] TRICOR 145 MG TABS (FENOFIBRATE) once daily  #90 x 3   Entered by:   Sid Falcon LPN   Authorized by:   Evelena Peat MD   Signed by:   Sid Falcon LPN on 38/75/6433   Method used:   Electronically to        [facility]* (mail-order)             ,          Ph: [phone]       Fax: [phone]   RxID:   [id number] GLIMEPIRIDE 2 MG TABS (GLIMEPIRIDE) once daily  #90 x 3   Entered by:   Sid Falcon LPN   Authorized by:   Evelena Peat MD   Signed by:   Sid Falcon LPN on 62/37/6283   Method used:   Electronically to        [facility]* (mail-order)             ,          Ph: [phone]       Fax: [phone]   RxID:   [id number] METFORMIN HCL 1000 MG TABS (METFORMIN HCL) two times a day  #180 x 3   Entered by:   Sid Falcon LPN   Authorized by:   Evelena Peat MD   Signed by:   Sid Falcon LPN on 93/71/6967   Method used:   Electronically to        [facility]* (mail-order)             ,          Ph: [phone]       Fax: [phone]   RxID:   [id number] SERTRALINE HCL 50 MG TABS (SERTRALINE HCL) once daily  #90 x 3   Entered by:   Sid Falcon LPN    Authorized by:   Evelena Peat MD   Signed by:   Sid Falcon LPN on 67/61/9509   Method used:   Electronically to        [facility]* (mail-order)             ,          Ph: [phone]       Fax: [phone]   RxID:   [id number]

## 2010-09-04 NOTE — Assessment & Plan Note (Signed)
Summary: hospital follow up per Katie//sh   Visit Type:  Hospital Follow-up for right pleura effusion Primary Provider/Referring Provider:  Dr. Doran Durand or Dr Evelena Peat - PMD, Dr. Glenna Fellows - CCS, Dr. Erlene Quan [facility], Dr Wayland Salinas - CVTS, Dr Marchelle Gearing - Pulmonary, Dr Elvera Lennox Buccini [facility]  CC:  HFU. Pt denies any breathing problems. Sh eis attendign rehab at [facility]. Marland Kitchen  History of Present Illness: August 20, 2010: This is a 75 year old never smoker who has been in and out of hospital x 3 since mid nov 2011. Initially underwent CABG 06/18/2010 but post op course was prolonged. She developed acute cholecystitis at that time and needed drain with plans for cholecystectomy later in Jan 2012. and she was admited for total 1 month and discharged 07/16/2010.  But readmitted 2 days later on 07/18/2010 for failure to thrive and nausea. Evaluation showed moderate-large sized  Rt pleural effusion. Thoracentesis 12/23 was bloody and revealed idiopathic exudate with total 1L removed (cytol reactive mesothelial cells, protein 4.3, ldh 190). This iswhen we were involved. Post thora CT 07/05/2010 suggested RLL pna as potential  etiology with residual rt effusion now largely subpulmpnic. Marland KitchenReview of serial imaging also showed that she actually developed this effusion newly in the aftermath of CABG on 06/18/2010. Therefore, this was considered as potential etiology as well  Subseuqently discharged 08/01/2010 to SNF_rehab but readdmitted 08/06/2010 - 08/11/2010 for hypoglycemia. CT abdomen 08/07/2010 still shows persistence of rt subpulmnic effusion.  Currently still in SNF-rehab. Son and she state she is doing real well. Gaining stregnth, and appetite. Walking with walker now. Feeling stronger. No confusion.Denies nausea, vomit, diarrhea, dyspnea, chest pain, wheeze. She is looking forward to her cholecystectomy. Main issue at [facility] is followup for right pleural effusion.   NOte: I personally and  indiependently reviwed images and dc summary in order to elicit the above summary  Preventive Screening-Counseling & Management  Alcohol-Tobacco     Smoking Status: never  Caffeine-Diet-Exercise     Does Patient Exercise: no  Allergies: 1)  ! Iodine (Iodine) 2)  ! * Shell Fish 3)  Neurontin (Gabapentin)  Past History:  Past medical, surgical, family and social histories (including risk factors) reviewed, and no changes noted (except as noted below).  Past Medical History: Reviewed history from 05/01/2010 and no changes required. Diabetes GERD Hypertension Colon polyps hypothyroid CAD Non ST elev MI 8/11 with LAD stenting    Mild LV impairment EF 45% reomote hx A Fib  Past Surgical History: Reviewed history from 08/20/2009 and no changes required. Tubal ligation, 1971 Tonsillectomy, 1945 fracture left elbow 1979  Past Pulmonary History:  Pulmonary History: ADMIT 08/08/2010-08/11/2010 under [facility]  1. Severe persistent hypoglycemia due to sulfonylurea - resolved.   2. Severe hyperglycemia due to prednisone prepped for a contrast study       - resolved.   3. Coronary artery disease status post coronary artery bypass       grafting, myocardial infarction, and stent to the left anterior       descending, November 2011.   4. Acute cholecystitis with cholecystostomy drain placed on June 29, 2010 - plans for cholecystectomy in late January as per Dr.       Jamse Mead notes.   5. Anasarca with ascites - the patient has refused paracentesis.   6. Recent mild right lower lobe pneumonia with a mild parapneumonic       effusion.   7.  Mild elevation lipase without signs of acute pancreatitis.   8. Chronic nausea, most likely due to combination of the acute       cholecystitis and ascites - controlled with Zofran.   9. Severe protein caloric malnutrition, discharge albumin of 2.4.   10.Anemia of chronic illness with admission hemoglobin of 7.8, status       post  transfusion of 2 units of packed red blood cells with the       discharge hemoglobin being 9.6 and without any signs of active       gastrointestinal bleeding.   11.Chronic kidney disease stage 3 of unclear etiology, query diabetic       nephropathy versus related to medications - discharge creatinine       for the patient being at 1.5.   12.Hypertension.   13.Hypervolemic hyponatremia on admission, resolved with diuresis.   14.Acute-on-chronic diastolic congestive heart failure and volume       overload, improved by the time of the discharge with weight going       down from 90 kg to 84 kg.   15.History of paroxysmal atrial fibrillation.   16.Hypothyroidism.      Pulmonary History-continued: DISCHARGE DIAGNOSES for ADMIT 07/18/2010 - 08/01/2010  1. Right lower lobe pneumonia.   2. Right-sided exudative pleural effusion.   3. Diarrhea, resolved; C. difficile negative.   4. Recent coronary artery bypass graft x3.   5. Thoracic aortic aneurysm repair with graft.   6. Recent history of acute cholecystitis with cholecystectomy tube.   7. Grade 2 diastolic dysfunction.   8. Sclerosis of aortic valve with aortic murmur.   9. Diabetes.   10.Hypertension.   11.Atrial fibrillation.   12.Acute renal failure due to nephrotoxic effects of vancomycin.   13.Protein calorie malnutrition.   14.Severe deconditioning.   15.Iron deficiency anemia.   16.Hypothyroidism.   17.History of Bell's palsy.   18.History of colitis.   DISCHARGE DIAGNOSIS 06/17/2010 - 07/16/2010  - s/p CABG 06/18/2010  - complicated post op course a) post op A fib, b) anemia needing PRBC, c) confusion, d) acute cholecystitis - treated with drain, e) deconditioning, f) depression  Family History: Reviewed history from 08/20/2009 and no changes required. Family History Hypertension, mother Family History of Arthritis Heart disease, father Stroke, father Vanessa Parshall osteoprosis, mother Alzheimers, father  Social  History: Reviewed history from 08/20/2009 and no changes required. 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, son is a Charity fundraiser Married Never Smoked Alcohol use-no Regular exercise-no Pt is living at [facility], she is doing PT/OT at [facility]  Review of Systems  The patient denies shortness of breath with activity, shortness of breath at rest, productive cough, non-productive cough, coughing up blood, chest pain, irregular heartbeats, acid heartburn, indigestion, loss of appetite, weight change, abdominal pain, difficulty swallowing, sore throat, tooth/dental problems, headaches, nasal congestion/difficulty breathing through nose, sneezing, itching, ear ache, anxiety, depression, hand/feet swelling, joint stiffness or pain, rash, change in color of mucus, and fever.    Vital Signs:  Patient profile:   75 year old female Menstrual status:  postmenopausal Height:      67.5 inches Weight:      179.50 pounds BMI:     27.80 O2 Sat:      95 % on Room air Temp:     97.4 degrees F oral Pulse rate:   73 / minute BP sitting:   142 / 76  (right arm) Cuff size:   regular  Vitals Entered By: Victorino Dike  Yancey Flemings CMA (August 20, 2010 2:08 PM)  O2 Flow:  Room air  Physical Exam  General:  looks incredibly better than when I saw her in hospitaal 3 weeks ago. Almost could not recognize her.  Head:  normocephalic and atraumatic Eyes:  PERRLA/EOM intact; conjunctiva and sclera clear Ears:  TMs intact and clear with normal canals Nose:  no deformity, discharge, inflammation, or lesions Mouth:  no deformity or lesions Neck:  no masses, thyromegaly, or abnormal cervical nodes Chest Wall:  scar of cabg + Lungs:  clear bilaterally to auscultation and percussion no evidence clinically of right pleural effusion except in theright  base there is some dullness Heart:  regular rate and rhythm, S1, S2 without murmurs, rubs, gallops, or clicks Abdomen:  soft rt upper quadrant drain + normal bowel sounds  Msk:  no  deformity or scoliosis noted with normal posture Pulses:  pulses normal Extremities:  no clubbing, cyanosis, edema, or deformity noted Neurologic:  CN II-XII grossly intact with normal reflexes, coordination, muscle strength and tone Skin:  intact without lesions or rashes Cervical Nodes:  no significant adenopathy Axillary Nodes:  no significant adenopathy Psych:  alert and cooperative; normal mood and affect; normal attention span and concentration   MISC. Report  Procedure date:  08/12/2010  Findings:      creay 1.39gm% Bili 0.3mg %  CXR  Procedure date:  08/20/2010  Findings:      small-moderate Rt pleural effusion. Better since 07/24/2010 but similar to early jan 2012  Comments:      independently reviewed  Impression & Recommendations:  Problem # 1:  PLEURAL EFFUSION, RIGHT (ICD-511.9) Assessment Unchanged As discussed in HPI etiologyfor this persisttent exudate (persistent since mid nov 2011 following CABG) is RLL pna, rt cholecystitis (doubt) or CABG (likely). Due to multiple admits, deconditioning, multiple procedure she DOES NOT want to go through repeat diagnostic thoracentesis or medical thoracoscopy at this point in time. She prefers serial monitoring. I am comfortable iwt this approach because tehe odds of malignancy are low. ROV 6-8 weeks with CXR Orders: T-2 View CXR (71020TC) Est. Patient Level IV (36644)  Problem # 2:  PRE-OPERATIVE RESPIRATORY EXAMINATION (ICD-V72.82) Assessment: New  She wishes to know her pulmnary risk for cholecystectomy.   Given her improvement in nutrition and functional status I would rate her current risk as low-moderate. Positive risk factors are recent prolonged illness, nutrition status (although improving), functional status (although imprving) and renal inssuffiency and recent cardiac issus. Howeve,r I do not see any contraindication. I will forward this note to Dr Johna Sheriff her surgeon  Orders: Est. Patient Level IV  [phone])  Patient Instructions: 1)  glad you are better  2)  the right pleural effusion is present in smaller amount 3)  I am ok to monitor this as opposed to draining fluid now 4)  OK from my perspective to have gall bladder surgery 5)  I will see you in 6-8 weeks 6)  Call usor come sooner if you are sick in hospital or any new problems 7)  Good luck 8)  cxr at followup

## 2010-09-04 NOTE — Letter (Signed)
Summary: Mammogram Results Letter/[facility]  Mammogram Results Letter/[facility]   Imported By: Maryln Gottron 08/22/2009 12:28:25  _____________________________________________________________________  External Attachment:    Type:   Image     Comment:   External Document

## 2010-09-04 NOTE — Assessment & Plan Note (Signed)
Summary: to be est at [facility]/med review/njr   Vital Signs:  Patient profile:   75 year old female Menstrual status:  postmenopausal Height:      67.50 inches Weight:      193 pounds BMI:     29.89 Temp:     98.4 degrees F oral Pulse rate:   96 / minute Pulse rhythm:   regular Resp:     12 per minute BP sitting:   142 / 80  (left arm) Cuff size:   regular  Vitals Entered By: Sid Falcon LPN (August 20, 2009 12:04 PM)  Nutrition Counseling: Patient's BMI is greater than 25 and therefore counseled on weight management options.  History of Present Illness: Here to establish.  Multiple medical problems including:  TYPE 2 DIABETES.  No recent A1C.  Regular eye exams.  CBGs stable and controlled per patient.  No log of readings today.  No hx retinopathy, nepropathy, or signif neuropathy.  GERD.  Symptoms stable. No dysphagia.  HYPERTENSION.  Compliant with meds.  HYPOTHYROID.  Compliant with med.  No symptoms of hypothyroid.  DYSLIPIDEMIA.  No hx CAD.  Recent mammogram normal.  Repeat colonoscopy last year per patient.  Hypertension History:      She denies headache, chest pain, palpitations, dyspnea with exertion, orthopnea, PND, peripheral edema, visual symptoms, neurologic problems, syncope, and side effects from treatment.  She notes no problems with any antihypertensive medication side effects.        Positive major cardiovascular risk factors include female age 58 years old or older, diabetes, hyperlipidemia, and hypertension.  Negative major cardiovascular risk factors include negative family history for ischemic heart disease and non-tobacco-user status.        Further assessment for target organ damage reveals no history of ASHD, cardiac end-organ damage (CHF/LVH), stroke/TIA, peripheral vascular disease, renal insufficiency, or hypertensive retinopathy.    Lipid Management History:      Positive NCEP/ATP III risk factors include female age 65 years old or older,  diabetes, and hypertension.  Negative NCEP/ATP III risk factors include no history of early menopause without estrogen hormone replacement, no family history for ischemic heart disease, non-tobacco-user status, no ASHD (atherosclerotic heart disease), no prior stroke/TIA, no peripheral vascular disease, and no history of aortic aneurysm.      Preventive Screening-Counseling & Management  Alcohol-Tobacco     Smoking Status: never  Caffeine-Diet-Exercise     Does Patient Exercise: no  Allergies (verified): 1)  ! Iodine (Iodine) 2)  ! * Shell Fish 3)  Neurontin (Gabapentin)  Past History:  Past Medical History: Diabetes GERD Hypertension Colon polyps hypothyroid Hay fever allergies Heart murmur Heart arrhythmia  Past Surgical History: Tubal ligation, 1971 Tonsillectomy, 1945 fracture left elbow 1979  Family History: Family History Hypertension, mother Family History of Arthritis Heart disease, father Stroke, father Serere osteoprosis, mother Alzheimers, father  Social History: Retired Married Never Smoked Alcohol use-no Regular exercise-no Smoking Status:  never Does Patient Exercise:  no  Review of Systems  The patient denies anorexia, fever, weight loss, weight gain, vision loss, decreased hearing, chest pain, syncope, dyspnea on exertion, peripheral edema, prolonged cough, headaches, hemoptysis, abdominal pain, melena, hematochezia, severe indigestion/heartburn, hematuria, incontinence, muscle weakness, suspicious skin lesions, depression, enlarged lymph nodes, and breast masses.    Physical Exam  General:  Well-developed,well-nourished,in no acute distress; alert,appropriate and cooperative throughout examination Ears:  External ear exam shows no significant lesions or deformities.  Otoscopic examination reveals clear canals, tympanic membranes are intact bilaterally without  bulging, retraction, inflammation or discharge. Hearing is grossly normal  bilaterally. Mouth:  Oral mucosa and oropharynx without lesions or exudates.  Teeth in good repair. Neck:  No deformities, masses, or tenderness noted. Lungs:  Normal respiratory effort, chest expands symmetrically. Lungs are clear to auscultation, no crackles or wheezes. Heart:  normal rate and regular rhythm.   Extremities:  no edema.  Diabetes Management Exam:    Foot Exam (with socks and/or shoes not present):       Sensory-Pinprick/Light touch:          Left medial foot (L-4): normal          Left dorsal foot (L-5): normal          Left lateral foot (S-1): normal          Right medial foot (L-4): normal          Right dorsal foot (L-5): normal          Right lateral foot (S-1): normal       Sensory-Monofilament:          Left foot: normal          Right foot: normal       Inspection:          Left foot: normal          Right foot: normal       Nails:          Left foot: normal          Right foot: normal    Eye Exam:       Eye Exam done elsewhere   Impression & Recommendations:  Problem # 1:  HYPERTENSION (ICD-401.9)  Her updated medication list for this problem includes:    Diltiazem Hcl Er Beads 360 Mg Xr24h-cap (Diltiazem hcl er beads) ..... Once daily    Furosemide 40 Mg Tabs (Furosemide) ..... Once daily    Diovan 320 Mg Tabs (Valsartan) ..... Once daily  Problem # 2:  HYPOTHYROIDISM (ICD-244.9)  Her updated medication list for this problem includes:    Synthroid 112 Mcg Tabs (Levothyroxine sodium) .Marland Kitchen... 1/2 tab daily  Problem # 3:  DYSLIPIDEMIA (ICD-272.4)  Her updated medication list for this problem includes:    Tricor 145 Mg Tabs (Fenofibrate) ..... Once daily  Problem # 4:  DIAB W/O COMP TYPE II/UNS NOT STATED UNCNTRL (ICD-250.00)  Her updated medication list for this problem includes:    Metformin Hcl 1000 Mg Tabs (Metformin hcl) .Marland Kitchen..Marland Kitchen Two times a day    Glimepiride 2 Mg Tabs (Glimepiride) ..... Once daily    Diovan 320 Mg Tabs (Valsartan) .....  Once daily  Problem # 5:  GERD (ICD-530.81)  Her updated medication list for this problem includes:    Ranitidine Hcl 75 Mg Tabs (Ranitidine hcl) ..... Once daily as needed  Problem # 6:  OSTEOPENIA (ICD-733.90)  Complete Medication List: 1)  Sertraline Hcl 50 Mg Tabs (Sertraline hcl) .... Once daily 2)  Metformin Hcl 1000 Mg Tabs (Metformin hcl) .... Two times a day 3)  Glimepiride 2 Mg Tabs (Glimepiride) .... Once daily 4)  Tricor 145 Mg Tabs (Fenofibrate) .... Once daily 5)  Diltiazem Hcl Er Beads 360 Mg Xr24h-cap (Diltiazem hcl er beads) .... Once daily 6)  Furosemide 40 Mg Tabs (Furosemide) .... Once daily 7)  Digoxin 0.125 Mg Tabs (Digoxin) .... Once daily 8)  Synthroid 112 Mcg Tabs (Levothyroxine sodium) .... 1/2 tab daily 9)  Potassium Chloride Cr 10 Meq Cr-caps (  Potassium chloride) .... Once daily 10)  Ranitidine Hcl 75 Mg Tabs (Ranitidine hcl) .... Once daily as needed 11)  Diovan 320 Mg Tabs (Valsartan) .... Once daily  Hypertension Assessment/Plan:      The patient's hypertensive risk group is category C: Target organ damage and/or diabetes.  Today's blood pressure is 142/80.    Lipid Assessment/Plan:      Based on NCEP/ATP III, the patient's risk factor category is "history of diabetes".  The patient's lipid goals are as follows: Total cholesterol goal is 200; LDL cholesterol goal is 100; HDL cholesterol goal is 40; Triglyceride goal is 150.    Patient Instructions: 1)  Schedule the following labs: 2)  Lipid panel  272.4 3)  Hepatic panel  272.4 4)  TSH  244.9 5)  BMP  401.9 6)  A1C  250.00 7)  Vit D level  733.90 8)  Check your blood sugars regularly. If your readings are usually above:  or below 70 you should contact our office.  9)  It is important that your diabetic A1c level is checked every 3 months.  10)  See your eye doctor yearly to check for diabetic eye damage. 11)  Check your feet each night  for sore areas, calluses or signs of infection.    Preventive Care Screening  Mammogram:    Date:  08/03/2009    Results:  normal   Colonoscopy:    Date:  08/03/2008    Results:  normal

## 2010-09-04 NOTE — Progress Notes (Signed)
Summary: vaccine recommendations  Phone Note Call from Patient   Caller: daughter,karen highfill,[phone] Summary of Call: Need your recommendations about updating 1) pneumonia vaccine, not sure when she had it & new recommendations from [facility] for the higher valent that covers more strains, 2) Tdap to get pertussis part - she doesn't remember, and needs flu shot.  Descending aortic aneurysm that she is in cardiac rehab for.  Dr. Allyson Sabal wants Dr. [name] Never to handle vaccines.  No symptoms of anything at this time.   Initial call taken by: Rudy Jew, RN,  April 18, 2010 12:27 PM  Follow-up for Phone Call        I recommend she get flu vaccine, Pneumovax repeat, and Tdap. Follow-up by: Evelena Peat MD,  April 18, 2010 12:47 PM  Additional Follow-up for Phone Call Additional follow up Details #1::        Notified daughter and they will call back for appt. Additional Follow-up by: Lynann Beaver CMA,  April 18, 2010 3:42 PM

## 2010-09-04 NOTE — Consult Note (Signed)
Summary: [place]  [place]   Imported By: Sherian Rein 07/31/2010 10:28:26  _____________________________________________________________________  External Attachment:    Type:   Image     Comment:   External Document

## 2010-09-04 NOTE — Progress Notes (Signed)
Summary: Pt had a heart attack yesterday. Admitted to [name]  Phone Note Call from Patient Call back at [phone] Lindsay Nguyen   Caller: Daughter - Clydie Braun Summary of Call: Pts daughter called and said that her mother had a heart attack yesterday and is in [facility] Room # 2915. She is ICU and is going to Cardiac Cath Lab today. Dr. Allyson Sabal with [facility], was attending physcian.   Pt is needing to get current med list sent to hospital, per hospital req.      Initial call taken by: Lucy Antigua,  March 21, 2010 10:03 AM  Follow-up for Phone Call        We need to make sure hospital has current med list. Follow-up by: Evelena Peat MD,  March 21, 2010 1:05 PM  Additional Follow-up for Phone Call Additional follow up Details #1::        Meds faxed to [phone], daughter informed Additional Follow-up by: Sid Falcon LPN,  March 21, 2010 2:16 PM

## 2010-09-08 ENCOUNTER — Other Ambulatory Visit: Payer: Self-pay | Admitting: General Surgery

## 2010-09-08 ENCOUNTER — Ambulatory Visit (HOSPITAL_COMMUNITY)
Admission: RE | Admit: 2010-09-08 | Discharge: 2010-09-09 | Disposition: A | Discharge status: Home / Self Care | Payer: Medicare Other | Source: Ambulatory Visit | Attending: General Surgery | Admitting: General Surgery

## 2010-09-08 ENCOUNTER — Ambulatory Visit (HOSPITAL_COMMUNITY): Payer: Medicare Other

## 2010-09-08 DIAGNOSIS — Z8673 Personal history of transient ischemic attack (TIA), and cerebral infarction without residual deficits: Secondary | ICD-10-CM | POA: Insufficient documentation

## 2010-09-08 DIAGNOSIS — K8010 Calculus of gallbladder with chronic cholecystitis without obstruction: Secondary | ICD-10-CM | POA: Insufficient documentation

## 2010-09-08 DIAGNOSIS — Z951 Presence of aortocoronary bypass graft: Secondary | ICD-10-CM | POA: Insufficient documentation

## 2010-09-08 DIAGNOSIS — E119 Type 2 diabetes mellitus without complications: Secondary | ICD-10-CM | POA: Insufficient documentation

## 2010-09-08 DIAGNOSIS — I2510 Atherosclerotic heart disease of native coronary artery without angina pectoris: Secondary | ICD-10-CM | POA: Insufficient documentation

## 2010-09-08 LAB — GLUCOSE, CAPILLARY: Glucose-Capillary: 146 mg/dL — ABNORMAL HIGH (ref 70–99)

## 2010-09-09 LAB — GLUCOSE, CAPILLARY
Glucose-Capillary: 149 mg/dL — ABNORMAL HIGH (ref 70–99)
Glucose-Capillary: 151 mg/dL — ABNORMAL HIGH (ref 70–99)

## 2010-09-10 NOTE — Letter (Signed)
[facility]: [facility]   Imported By: Maryln Gottron 09/04/2010 14:53:59  _____________________________________________________________________  External Attachment:    Type:   Image     Comment:   External Document

## 2010-09-22 ENCOUNTER — Other Ambulatory Visit: Payer: Self-pay | Admitting: Surgery

## 2010-09-22 DIAGNOSIS — I2510 Atherosclerotic heart disease of native coronary artery without angina pectoris: Secondary | ICD-10-CM

## 2010-09-23 ENCOUNTER — Ambulatory Visit
Admission: RE | Admit: 2010-09-23 | Discharge: 2010-09-23 | Disposition: A | Discharge status: Home / Self Care | Payer: Medicare Other | Source: Ambulatory Visit | Attending: Surgery | Admitting: Surgery

## 2010-09-23 ENCOUNTER — Encounter (INDEPENDENT_AMBULATORY_CARE_PROVIDER_SITE_OTHER): Payer: Medicare Other | Admitting: Surgery

## 2010-09-23 DIAGNOSIS — I2510 Atherosclerotic heart disease of native coronary artery without angina pectoris: Secondary | ICD-10-CM

## 2010-09-23 DIAGNOSIS — I712 Thoracic aortic aneurysm, without rupture: Secondary | ICD-10-CM

## 2010-09-24 NOTE — Assessment & Plan Note (Signed)
OFFICE VISIT  Nguyen, Lindsay P DOB:  1936-05-27                                        September 23, 2010 CHART #:  [id number]  The patient returned to my office today for initial postoperative followup status post coronary artery bypass graft surgery x3 and resection grafting of an ascending aortic aneurysm with a supracoronary Hemashield tube graft on June 18, 2010.  Her postoperative course was complicated by preoperative debilitation and development of acute cholecystitis and biliary pancreatitis postoperatively.  She required insertion of a percutaneous cholecystostomy tube for this and had some recurrent episodes of pancreatitis after diet was restarted.  This resulted in a prolonged postoperative course with few weeks of parenteral nutrition.  She gradually improved enough that she could be discharged to a skilled nursing facility but then subsequently required readmission for pneumonia.  She gradually recovered from all of this and underwent laparoscopic cholecystectomy by Dr. Johna Sheriff earlier this month.  She said she is now feeling fairly well.  Her appetite is slowly improving, but she said food still does not taste like she remembers it. She has been putting on a little bit of weight.  She has been walking without any chest pain or shortness of breath and feels much better from a cardiac standpoint.  Her only complaint now is of some abdominal bloating at times.  PHYSICAL EXAMINATION:  VITAL SIGNS:  Today, blood pressure 170/86, pulse is 88 and regular, respiratory rate is 20 and unlabored.  Oxygen saturation on room air is 96%.  GENERAL:  She looks much brighter than she did during her hospitalization.  She is conversant and smiling. CARDIAC:  Regular rate and rhythm with normal S1 and S2.  There is no murmur, rub, or gallop.  LUNGS:  Clear.  CHEST:  Incision is well healed and the sternum is stable.  ABDOMEN:  Active bowel sounds.  Abdomen  is soft and nontender.  The cholecystectomy incisions are healing well. EXTREMITIES:  There is mild bilateral ankle edema.  She is wearing TED stockings.  Her medication list was reviewed and no changes were made.  Follow up chest x-ray today shows a small residual right pleural effusion with basilar atelectasis.  This HAS decreased in size compared to chest x-ray in early February.  IMPRESSION:  Overall, I think the patient is finally making a good recovery following her long complicated course.  I told her and her family that I would expect it to take her several more months to recover from this.  I told her she can return to driving a car which she feels comfortable with that and was free to increase her activity.  There is really no restriction on her activity or lifting at this time.  She will continue to follow up with Dr. Allyson Sabal as well as Dr. [name] Never for medical care and will contact me if she develops any problems with her chest incision.  Evelene Croon, M.D. Electronically Signed  BB/MEDQ  D:  09/23/2010  T:  09/24/2010  Job:  [id number]  cc:   Nanetta Batty, M.D. Evelena Peat, M.D.

## 2010-09-29 NOTE — Op Note (Signed)
NAMESHAKEVIA, SARRIS                ACCOUNT NO.:  [id number]  MEDICAL RECORD NO.:  [id number]           PATIENT TYPE:  O  LOCATION:  4738                         FACILITY:  [facility]  PHYSICIAN:  Sharlet Salina T. Taryn Nave, M.D.DATE OF BIRTH:  1936-01-22  DATE OF PROCEDURE:  09/08/2010 DATE OF DISCHARGE:                              OPERATIVE REPORT   PREOPERATIVE DIAGNOSIS:  Cholelithiasis, cholecystitis.  POSTOPERATIVE DIAGNOSIS:  Cholelithiasis, cholecystitis.  SURGICAL PROCEDURES:  Laparoscopic cholecystectomy with intraoperative cholangiogram.  SURGEON:  Sharlet Salina T. Zarea Diesing, MD  ASSISTANT:  Anselm Pancoast. Zachery Dakins, MD  ANESTHESIA:  General.  BRIEF HISTORY:  Baudelia Schroepfer is a 75 year old female who is postoperative from coronary bypass grafting and proximal aortic replacement developed acute cholecystitis in association with gallstones.  She had percutaneous drainage at that time which was about 8 weeks ago.  She has progressed nicely.  She has had some intermittent mild pancreatitis felt to passing small stones.  We now recommended proceeding with laparoscopic cholecystectomy with cholangiogram.  Ashby Dawes of the procedure, indications, risks of bleeding, infection, bile leak, bile duct injury, anesthetic complications, possible need for open procedure were discussed and understood.  She is now brought to the operating room for this procedure.  DESCRIPTION OF OPERATION:  The patient was brought to the operating room, placed in supine position on the operating table and general endotracheal anesthesia was induced.  The abdomen was widely sterilely prepped and draped.  PAS were in place.  Correct patient and procedure were verified.  She had received preoperative IV antibiotics.  Local anesthesia was used to infiltrate the trocar sites.  A 1-cm incision was made at the umbilicus.  Dissection was carried down to the midline fascia which was incised for 1 cm.  The peritoneum was entered  under direct vision.  Through a mattress suture of 0 Vicryl, the Hasson trocar was placed and pneumoperitoneum established.  Under direct vision, an 11- mm trocar was placed in subxiphoid.  There were omental adhesions up in the right upper quadrant to the catheter insertion site.  A single 5-mm port was placed laterally in the right upper quadrant for retraction. The omentum was then dissected away from the anterior abdominal wall exposing the catheter.  The catheter was then cut outside the skin and then was retracted from the gallbladder intact and removed.  A second 5- mm trocar was then placed, the fundus elevated over the liver, and the infundibulum exposed and retracted inferolaterally.  The gallbladder was thickened and somewhat edematous, but there was no severe inflammation or fibrosis and no severe adhesions.  Peritoneum anterior and posterior to Calot triangle was incised and fibrofatty tissue was stripped off the neck of the gallbladder toward the porta hepatis.  The Calot triangle in the distal gallbladder was thoroughly dissected.  The cystic artery was clearly identified at Calot triangle coursing up on the gallbladder wall and it was divided between two proximal and one distal clip.  The distal gallbladder and cystic duct were further dissected.  The cystic duct- gallbladder junction dissected 360 degrees and the cystic duct dissected out over about a centimeter.  When the  anatomy was clear, the cystic duct was clipped at the gallbladder junction and operative cholangiogram obtained through the cystic duct.  This showed good filling with normal common bile duct and intrahepatic ducts with free flow into the duodenum and no filling defects.  Following this, cholangiocath was removed and the cystic duct was triply clipped proximally and divided.  The gallbladder was then dissected free from its bed using hook cautery, placed in an Endocatch bag and removed through the  umbilicus.  The right upper quadrant was thoroughly irrigated and hemostasis assured.  There was no evidence of any bile leak from the percutaneous site in the liver.  There was no bleeding.  No trocar injury.  Following this, all trocars were removed and CO2 evacuated.  The mattress suture was secured to the umbilicus and two additional simple sutures placed.  Skin incisions were closed with subcuticular Monocryl and Dermabond.  The drain site was dressed with Neosporin and sterile gauze.  Sponge, needle, and instrument counts correct.  The patient taken to recovery in good condition.     Lorne Skeens. Bryony Kaman, M.D.     Tory Emerald  D:  09/08/2010  T:  09/08/2010  Job:  [id number]  Electronically Signed by Glenna Fellows M.D. on 09/29/2010 03:12:30 PM

## 2010-10-01 ENCOUNTER — Ambulatory Visit (INDEPENDENT_AMBULATORY_CARE_PROVIDER_SITE_OTHER): Payer: Medicare Other | Admitting: Internal Medicine

## 2010-10-01 ENCOUNTER — Encounter: Payer: Self-pay | Admitting: Internal Medicine

## 2010-10-01 ENCOUNTER — Telehealth: Payer: Self-pay | Admitting: *Deleted

## 2010-10-01 DIAGNOSIS — J90 Pleural effusion, not elsewhere classified: Secondary | ICD-10-CM

## 2010-10-01 NOTE — Telephone Encounter (Signed)
Needs appt for a certain day.

## 2010-10-03 ENCOUNTER — Encounter: Payer: Self-pay | Admitting: Family Medicine

## 2010-10-03 ENCOUNTER — Ambulatory Visit (INDEPENDENT_AMBULATORY_CARE_PROVIDER_SITE_OTHER): Payer: Medicare Other | Admitting: Family Medicine

## 2010-10-03 DIAGNOSIS — E119 Type 2 diabetes mellitus without complications: Secondary | ICD-10-CM

## 2010-10-03 DIAGNOSIS — I10 Essential (primary) hypertension: Secondary | ICD-10-CM

## 2010-10-03 DIAGNOSIS — I2510 Atherosclerotic heart disease of native coronary artery without angina pectoris: Secondary | ICD-10-CM

## 2010-10-03 DIAGNOSIS — D649 Anemia, unspecified: Secondary | ICD-10-CM

## 2010-10-03 LAB — CBC WITH DIFFERENTIAL/PLATELET
Eosinophils Relative: 4.3 % (ref 0.0–5.0)
HCT: 35.3 % — ABNORMAL LOW (ref 36.0–46.0)
Lymphs Abs: 1.9 10*3/uL (ref 0.7–4.0)
MCV: 86 fl (ref 78.0–100.0)
Monocytes Absolute: 0.7 10*3/uL (ref 0.1–1.0)
Platelets: 250 10*3/uL (ref 150.0–400.0)
WBC: 8.3 10*3/uL (ref 4.5–10.5)

## 2010-10-03 LAB — BASIC METABOLIC PANEL
BUN: 21 mg/dL (ref 6–23)
CO2: 29 mEq/L (ref 19–32)
Chloride: 100 mEq/L (ref 96–112)
Creatinine, Ser: 0.8 mg/dL (ref 0.4–1.2)
Glucose, Bld: 180 mg/dL — ABNORMAL HIGH (ref 70–99)

## 2010-10-03 MED ORDER — AMLODIPINE BESYLATE 5 MG PO TABS: 5.0000 mg | ORAL_TABLET | Freq: Every day | ORAL | Status: DC

## 2010-10-03 NOTE — Patient Instructions (Signed)
Follow up immediately for any recurrent diarrhea, abdominal pain, or fever.

## 2010-10-03 NOTE — Progress Notes (Signed)
  Subjective:    Patient ID: Lindsay Nguyen, female    DOB: 12/24/1935, 75 y.o.   MRN: [id number]  HPI  Patient seen for followup. Complicated past medical history with multiple hospital admissions since last fall. She underwent bypass graft June 18, 2010. Prolonged hospital course. Developed acute cholecystitis requiring drainage with plans for later cholecystectomy. Also developed large right pleural effusion and right lower lobe pneumonia. Significant failure to thrive and nausea. Patient also had recurrent pancreatitis during this time period and, in all , she had 3 different admissions and skilled nursing facility rehabilitation. Eventually discharged home and has been gaining strength since then.   Type 2 diabetes. At one point hypoglycemic and Amaryl discontinued. Metformin reduce from 2000 mg daily 500 mg daily. Recent blood sugars fasting around 180. No symptoms of hyperglycemia.  Hypertension recently poor control with consistent systolic 160s diastolics 70s. Patient currently on Coreg,  Lasix, and Diovan   Review of Systems  Constitutional: Positive for fatigue. Negative for fever and chills.  HENT: Negative for congestion, sore throat and trouble swallowing.   Eyes: Negative for visual disturbance.  Respiratory: Negative for cough and wheezing.   Cardiovascular: Negative for chest pain, palpitations and leg swelling.  Gastrointestinal: Negative for abdominal distention.  Genitourinary: Negative for dysuria and hematuria.  Neurological: Negative for seizures, syncope and headaches.  Psychiatric/Behavioral: Negative for dysphoric mood.       Objective:   Physical Exam   Patient is alert and nontoxic in appearance Oropharynx is moist and clear PERRL Neck supple no adenopathy Chest slightly diminished breath sounds at right base compared to left otherwise clear Heart regular rhythm and rate  Extremities support hose in place.  No significant pitting edema appreciated SKIN  no rash       Assessment & Plan:   #1 type 2 diabetes poorly controlled by recent home readings #2 hypertension suboptimally controlled #3 history of CAD with recent bypass  #4 hypothyroidism  #5 dyslipidemia  #6 history of acute cholecystitis   Plan check labs of CBC , basic metabolic panel , and hemoglobin A1c. Titrate metformin if her creatinine is stable. And amlodipine 5 mg daily and review possible side effects. Reassess blood pressure 3 weeks

## 2010-10-05 ENCOUNTER — Encounter: Payer: Self-pay | Admitting: Family Medicine

## 2010-10-06 ENCOUNTER — Telehealth: Payer: Self-pay | Admitting: *Deleted

## 2010-10-06 ENCOUNTER — Encounter (HOSPITAL_COMMUNITY): Payer: Medicare Other | Attending: Cardiovascular Disease

## 2010-10-06 ENCOUNTER — Other Ambulatory Visit: Payer: Self-pay

## 2010-10-06 DIAGNOSIS — I712 Thoracic aortic aneurysm, without rupture, unspecified: Secondary | ICD-10-CM | POA: Insufficient documentation

## 2010-10-06 DIAGNOSIS — I2510 Atherosclerotic heart disease of native coronary artery without angina pectoris: Secondary | ICD-10-CM | POA: Insufficient documentation

## 2010-10-06 DIAGNOSIS — Z951 Presence of aortocoronary bypass graft: Secondary | ICD-10-CM | POA: Insufficient documentation

## 2010-10-06 DIAGNOSIS — Z9861 Coronary angioplasty status: Secondary | ICD-10-CM | POA: Insufficient documentation

## 2010-10-06 DIAGNOSIS — Z5189 Encounter for other specified aftercare: Secondary | ICD-10-CM | POA: Insufficient documentation

## 2010-10-06 DIAGNOSIS — I10 Essential (primary) hypertension: Secondary | ICD-10-CM | POA: Insufficient documentation

## 2010-10-06 DIAGNOSIS — Z91013 Allergy to seafood: Secondary | ICD-10-CM | POA: Insufficient documentation

## 2010-10-06 DIAGNOSIS — E785 Hyperlipidemia, unspecified: Secondary | ICD-10-CM | POA: Insufficient documentation

## 2010-10-06 DIAGNOSIS — E119 Type 2 diabetes mellitus without complications: Secondary | ICD-10-CM | POA: Insufficient documentation

## 2010-10-06 DIAGNOSIS — E039 Hypothyroidism, unspecified: Secondary | ICD-10-CM | POA: Insufficient documentation

## 2010-10-06 DIAGNOSIS — I4891 Unspecified atrial fibrillation: Secondary | ICD-10-CM | POA: Insufficient documentation

## 2010-10-06 DIAGNOSIS — I252 Old myocardial infarction: Secondary | ICD-10-CM | POA: Insufficient documentation

## 2010-10-06 DIAGNOSIS — Z7902 Long term (current) use of antithrombotics/antiplatelets: Secondary | ICD-10-CM | POA: Insufficient documentation

## 2010-10-06 LAB — GLUCOSE, CAPILLARY: Glucose-Capillary: 354 mg/dL — ABNORMAL HIGH (ref 70–99)

## 2010-10-06 NOTE — Progress Notes (Signed)
Quick Note:  Barbie Haggis called requesting labs, she is a designated person to receive medical info. She was informed Metformin increase to BID, repeat A1C in 3 months on personally identified VM ______

## 2010-10-06 NOTE — Telephone Encounter (Signed)
Pt has been using her husbands handicap card in car.  This weekend they parked in a handicap parking area with out card in sight, so they got a ticket. Joycelyn Man, daughter in law asking for pt personal handicap card and a letter explaining she was handicapped at the time of event, date it in February sometime should be fine.  Clydie Braun will pick-up letter and handicap card

## 2010-10-08 ENCOUNTER — Other Ambulatory Visit: Payer: Self-pay

## 2010-10-08 ENCOUNTER — Encounter (HOSPITAL_COMMUNITY): Payer: Medicare Other

## 2010-10-09 NOTE — Assessment & Plan Note (Signed)
Summary: 6-8 week return//[facility]hh   Visit Type:  Follow-up Primary Provider/Referring Provider:  Dr. Doran Durand or Dr Evelena Peat - PMD, Dr. Glenna Fellows - CCS, Dr. Erlene Quan [facility], Dr Wayland Salinas - CVTS, Dr Marchelle Gearing - Pulmonary, Dr Elvera Lennox Buccini [facility]  CC:  Pt here for 6 week follow-up.  History of Present Illness: August 20, 2010: This is a 75 year old never smoker who has been in and out of hospital x 3 since mid nov 2011. Initially underwent CABG 06/18/2010 but post op course was prolonged. She developed acute cholecystitis at that time and needed drain with plans for cholecystectomy later in Jan 2012. and she was admited for total 1 month and discharged 07/16/2010.  But readmitted 2 days later on 07/18/2010 for failure to thrive and nausea. Evaluation showed moderate-large sized  Rt pleural effusion. Thoracentesis 12/23 was bloody and revealed idiopathic exudate with total 1L removed (cytol reactive mesothelial cells, protein 4.3, ldh 190). This iswhen we were involved. Post thora CT 07/05/2010 suggested RLL pna as potential  etiology with residual rt effusion now largely subpulmpnic. Marland KitchenReview of serial imaging also showed that she actually developed this effusion newly in the aftermath of CABG on 06/18/2010. Therefore, this was considered as potential etiology as well  Subseuqently discharged 08/01/2010 to SNF_rehab but readdmitted 08/06/2010 - 08/11/2010 for hypoglycemia. CT abdomen 08/07/2010 still shows persistence of rt subpulmnic effusion.  Currently still in SNF-rehab. Son and she state she is doing real well. Gaining stregnth, and appetite. Walking with walker now. Feeling stronger. No confusion.Denies nausea, vomit, diarrhea, dyspnea, chest pain, wheeze. She is looking forward to her cholecystectomy. Main issue at [facility] is followup for right pleural effusion.  REC: OK FOR CHOLE, ROV 6 WEEKS WITH CXR  October 01, 2010: followup Rt pleural effusion. IN interim, had uneventful  cholecystectomy on 09/08/2010. She is now home. Going to stat cardiac rehab. ECOG has imprvoed to 1-2. Getting slowly fit. Doing ADLs. Looking at driving againg. Saw Dr. Laneta Simmers on 2/21 and cleard from followup. CXR 09/23/2010 shows persistence / small improvement of small rt pleural effusion. She denies dyspnea, cough, fever, sputum, weight loss, edema though.    Preventive Screening-Counseling & Management  Alcohol-Tobacco     Smoking Status: never  Caffeine-Diet-Exercise     Does Patient Exercise: no  Current Medications (verified): 1)  Lexapro 5 Mg Tabs (Escitalopram Oxalate) .... Take 1 Tablet By Mouth Once A Day 2)  Metformin Hcl 1000 Mg Tabs (Metformin Hcl) .... Two Times A Day 3)  Cardizem La 180 Mg Xr24h-Tab (Diltiazem Hcl Coated Beads) .... One By Mouth Once Daily 4)  Coreg 12.5 Mg Tabs (Carvedilol) .... Take 1 Tablet By Mouth Two Times A Day 5)  Synthroid 112 Mcg Tabs (Levothyroxine Sodium) .... 1/2 Tab Daily  Brand Only Per Dr [name] Never 6)  Klor-Con M20 20 Meq Cr-Tabs (Potassium Chloride Crys Cr) .... Take 1 Tablet By Mouth Once A Day 7)  Diovan 320 Mg Tabs (Valsartan) .... Once Daily 8)  Crestor 10 Mg Tabs (Rosuvastatin Calcium) .... Take 1 Tablet By Mouth Once A Day 9)  Furosemide 40 Mg Tabs (Furosemide) .... Take 1 Tablet By Mouth Once A Day 10)  Aspir-Low 81 Mg Tbec (Aspirin) .... Take 1 Tablet By Mouth Once A Day 11)  Nu-Iron 150 Mg Caps (Polysaccharide Iron Complex) .... Take 1 Tablet By Mouth Once A Day 12)  Nexium 20 Mg Cpdr (Esomeprazole Magnesium) .... Take 1 Tablet By Mouth Once A Day 13)  Vitamin D3 2000 Unit Caps (Cholecalciferol) .... Take 1 Tablet By Mouth Once A Day 14)  Tramadol Hcl 50 Mg Tabs (Tramadol Hcl) .... One By Mouth Every 6 Hours As Needed 15)  Folic Acid 1 Mg Tabs (Folic Acid) .... Take 1 Tablet By Mouth Once A Day 16)  Xanax 0.5 Mg Tabs (Alprazolam) .... Take 1 Tab By Mouth At Bedtime 17)  Vicodin 5-500 Mg Tabs (Hydrocodone-Acetaminophen) .... One By  Mouth Every 6 Hours As Needed  Allergies (verified): 1)  ! Iodine (Iodine) 2)  ! * Shell Fish 3)  Neurontin (Gabapentin)  Past History:  Past medical, surgical, family and social histories (including risk factors) reviewed, and no changes noted (except as noted below).  Past Medical History: Reviewed history from 05/01/2010 and no changes required. Diabetes GERD Hypertension Colon polyps hypothyroid CAD Non ST elev MI 8/11 with LAD stenting    Mild LV impairment EF 45% reomote hx A Fib  Past Surgical History: Reviewed history from 08/20/2009 and no changes required. Tubal ligation, 1971 Tonsillectomy, 1945 fracture left elbow 1979  Past Pulmonary History:  Pulmonary History: ADMIT 08/08/2010-08/11/2010 under [facility]  1. Severe persistent hypoglycemia due to sulfonylurea - resolved.   2. Severe hyperglycemia due to prednisone prepped for a contrast study       - resolved.   3. Coronary artery disease status post coronary artery bypass       grafting, myocardial infarction, and stent to the left anterior       descending, November 2011.   4. Acute cholecystitis with cholecystostomy drain placed on June 29, 2010 - plans for cholecystectomy in late January as per Dr.       Jamse Mead notes.   5. Anasarca with ascites - the patient has refused paracentesis.   6. Recent mild right lower lobe pneumonia with a mild parapneumonic       effusion.   7. Mild elevation lipase without signs of acute pancreatitis.   8. Chronic nausea, most likely due to combination of the acute       cholecystitis and ascites - controlled with Zofran.   9. Severe protein caloric malnutrition, discharge albumin of 2.4.   10.Anemia of chronic illness with admission hemoglobin of 7.8, status       post transfusion of 2 units of packed red blood cells with the       discharge hemoglobin being 9.6 and without any signs of active       gastrointestinal bleeding.   11.Chronic kidney disease stage 3  of unclear etiology, query diabetic       nephropathy versus related to medications - discharge creatinine       for the patient being at 1.5.   12.Hypertension.   13.Hypervolemic hyponatremia on admission, resolved with diuresis.   14.Acute-on-chronic diastolic congestive heart failure and volume       overload, improved by the time of the discharge with weight going       down from 90 kg to 84 kg.   15.History of paroxysmal atrial fibrillation.   16.Hypothyroidism.      Pulmonary History-continued: DISCHARGE DIAGNOSES for ADMIT 07/18/2010 - 08/01/2010  1. Right lower lobe pneumonia.   2. Right-sided exudative pleural effusion.   3. Diarrhea, resolved; C. difficile negative.   4. Recent coronary artery bypass graft x3.   5. Thoracic aortic aneurysm repair with graft.   6. Recent history of acute cholecystitis with cholecystectomy tube.   7. Grade  2 diastolic dysfunction.   8. Sclerosis of aortic valve with aortic murmur.   9. Diabetes.   10.Hypertension.   11.Atrial fibrillation.   12.Acute renal failure due to nephrotoxic effects of vancomycin.   13.Protein calorie malnutrition.   14.Severe deconditioning.   15.Iron deficiency anemia.   16.Hypothyroidism.   17.History of Bell's palsy.   18.History of colitis.   DISCHARGE DIAGNOSIS 06/17/2010 - 07/16/2010  - s/p CABG 06/18/2010  - complicated post op course a) post op A fib, b) anemia needing PRBC, c) confusion, d) acute cholecystitis - treated with drain, e) deconditioning, f) depression  Family History: Reviewed history from 08/20/2009 and no changes required. Family History Hypertension, mother Family History of Arthritis Heart disease, father Stroke, father Vanessa  osteoprosis, mother Alzheimers, father  Social History: Reviewed history from 08/20/2010 and no changes required. 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, son is a Charity fundraiser Married Never Smoked Alcohol use-no Regular exercise-no Pt is living at [facility], she is  doing PT/OT at [facility]  Review of Systems  The patient denies anorexia, fever, weight loss, weight gain, vision loss, decreased hearing, hoarseness, chest pain, syncope, dyspnea on exertion, peripheral edema, prolonged cough, headaches, hemoptysis, abdominal pain, melena, hematochezia, severe indigestion/heartburn, hematuria, incontinence, genital sores, muscle weakness, suspicious skin lesions, transient blindness, difficulty walking, depression, unusual weight change, abnormal bleeding, enlarged lymph nodes, angioedema, breast masses, and testicular masses.    Vital Signs:  Patient profile:   75 year old female Menstrual status:  postmenopausal Height:      67.5 inches Weight:      176 pounds BMI:     27.26 O2 Sat:      97 % on Room air Temp:     97.3 degrees F oral Pulse rate:   69 / minute BP sitting:   152 / 78  (right arm) Cuff size:   regular  Vitals Entered By: Carron Curie CMA (October 01, 2010 2:42 PM)  O2 Flow:  Room air CC: Pt here for 6 week follow-up Comments Medications reviewed with patient Yancey Flemings CMA  October 01, 2010 2:42 PM Daytime phone number verified with patient.    Physical Exam  General:  normal appearance and healthy appearing.   Head:  normocephalic and atraumatic Eyes:  PERRLA/EOM intact; conjunctiva and sclera clear Ears:  TMs intact and clear with normal canals Nose:  no deformity, discharge, inflammation, or lesions Mouth:  no deformity or lesions Neck:  no masses, thyromegaly, or abnormal cervical nodes Chest Wall:  scar of cabg + Lungs:  clear bilaterally to auscultation and percussion no evidence clinically of right pleural effusion except in theright  base there is some dullness Heart:  regular rate and rhythm, S1, S2 without murmurs, rubs, gallops, or clicks Abdomen:  soft normal bowel sounds   Msk:  no deformity or scoliosis noted with normal posture Pulses:  pulses normal Extremities:  no clubbing, cyanosis, edema, or  deformity noted Neurologic:  CN II-XII grossly intact with normal reflexes, coordination, muscle strength and tone Skin:  intact without lesions or rashes Cervical Nodes:  no significant adenopathy Axillary Nodes:  no significant adenopathy Psych:  alert and cooperative; normal mood and affect; normal attention span and concentration   CXR  Procedure date:  09/23/2010  Findings:      small rt pleural effusion  Comments:      independently reviewed  Impression & Recommendations:  Problem # 1:  PLEURAL EFFUSION, RIGHT (ICD-511.9) Assessment Unchanged  this small residual effusion persists. SUspect  it is post CABG. Doubt malignancy. Very reassuring overall. Explained that likely will resolve over next 9 months and if it does not still wont impair ADLs but occ can turn malignant or get secondary infection. She prefers conservative approach. Will see her in 9 months with cxr.   Orders: Est. Patient Level III (36144)  Medications Added to Medication List This Visit: 1)  Lexapro 5 Mg Tabs (Escitalopram oxalate) .... Take 1 tablet by mouth once a day 2)  Coreg 12.5 Mg Tabs (Carvedilol) .... Take 1 tablet by mouth two times a day 3)  Klor-con M20 20 Meq Cr-tabs (Potassium chloride crys cr) .... Take 1 tablet by mouth once a day 4)  Crestor 10 Mg Tabs (Rosuvastatin calcium) .... Take 1 tablet by mouth once a day 5)  Furosemide 40 Mg Tabs (Furosemide) .... Take 1 tablet by mouth once a day 6)  Aspir-low 81 Mg Tbec (Aspirin) .... Take 1 tablet by mouth once a day 7)  Nu-iron 150 Mg Caps (Polysaccharide iron complex) .... Take 1 tablet by mouth once a day 8)  Nexium 20 Mg Cpdr (Esomeprazole magnesium) .... Take 1 tablet by mouth once a day 9)  Vitamin D3 2000 Unit Caps (Cholecalciferol) .... Take 1 tablet by mouth once a day 10)  Tramadol Hcl 50 Mg Tabs (Tramadol hcl) .... One by mouth every 6 hours as needed 11)  Folic Acid 1 Mg Tabs (Folic acid) .... Take 1 tablet by mouth once a day 12)   Xanax 0.5 Mg Tabs (Alprazolam) .... Take 1 tab by mouth at bedtime 13)  Vicodin 5-500 Mg Tabs (Hydrocodone-acetaminophen) .... One by mouth every 6 hours as needed  Patient Instructions: 1)  glad you are doing better 2)  return in 9 months or come sooner if there are problems 3)  focus on getting fit and enjoying life

## 2010-10-09 NOTE — Letter (Signed)
Summary: [facility]   Imported By: Sherian Rein 10/02/2010 09:09:22  _____________________________________________________________________  External Attachment:    Type:   Image     Comment:   External Document

## 2010-10-10 ENCOUNTER — Other Ambulatory Visit: Payer: Self-pay

## 2010-10-10 ENCOUNTER — Encounter (HOSPITAL_COMMUNITY): Payer: Medicare Other

## 2010-10-10 LAB — GLUCOSE, CAPILLARY: Glucose-Capillary: 186 mg/dL — ABNORMAL HIGH (ref 70–99)

## 2010-10-13 ENCOUNTER — Other Ambulatory Visit: Payer: Self-pay | Admitting: Family Medicine

## 2010-10-13 ENCOUNTER — Other Ambulatory Visit: Payer: Self-pay

## 2010-10-13 ENCOUNTER — Encounter (HOSPITAL_COMMUNITY): Payer: Medicare Other

## 2010-10-13 ENCOUNTER — Ambulatory Visit (HOSPITAL_COMMUNITY): Payer: Medicare Other

## 2010-10-13 LAB — GLUCOSE, SEROUS FLUID: Glucose, Fluid: 130 mg/dL

## 2010-10-13 LAB — GLUCOSE, CAPILLARY
Glucose-Capillary: 105 mg/dL — ABNORMAL HIGH (ref 70–99)
Glucose-Capillary: 111 mg/dL — ABNORMAL HIGH (ref 70–99)
Glucose-Capillary: 112 mg/dL — ABNORMAL HIGH (ref 70–99)
Glucose-Capillary: 114 mg/dL — ABNORMAL HIGH (ref 70–99)
Glucose-Capillary: 114 mg/dL — ABNORMAL HIGH (ref 70–99)
Glucose-Capillary: 116 mg/dL — ABNORMAL HIGH (ref 70–99)
Glucose-Capillary: 121 mg/dL — ABNORMAL HIGH (ref 70–99)
Glucose-Capillary: 122 mg/dL — ABNORMAL HIGH (ref 70–99)
Glucose-Capillary: 123 mg/dL — ABNORMAL HIGH (ref 70–99)
Glucose-Capillary: 124 mg/dL — ABNORMAL HIGH (ref 70–99)
Glucose-Capillary: 125 mg/dL — ABNORMAL HIGH (ref 70–99)
Glucose-Capillary: 126 mg/dL — ABNORMAL HIGH (ref 70–99)
Glucose-Capillary: 127 mg/dL — ABNORMAL HIGH (ref 70–99)
Glucose-Capillary: 130 mg/dL — ABNORMAL HIGH (ref 70–99)
Glucose-Capillary: 131 mg/dL — ABNORMAL HIGH (ref 70–99)
Glucose-Capillary: 132 mg/dL — ABNORMAL HIGH (ref 70–99)
Glucose-Capillary: 132 mg/dL — ABNORMAL HIGH (ref 70–99)
Glucose-Capillary: 134 mg/dL — ABNORMAL HIGH (ref 70–99)
Glucose-Capillary: 136 mg/dL — ABNORMAL HIGH (ref 70–99)
Glucose-Capillary: 139 mg/dL — ABNORMAL HIGH (ref 70–99)
Glucose-Capillary: 140 mg/dL — ABNORMAL HIGH (ref 70–99)
Glucose-Capillary: 147 mg/dL — ABNORMAL HIGH (ref 70–99)
Glucose-Capillary: 159 mg/dL — ABNORMAL HIGH (ref 70–99)
Glucose-Capillary: 160 mg/dL — ABNORMAL HIGH (ref 70–99)
Glucose-Capillary: 175 mg/dL — ABNORMAL HIGH (ref 70–99)
Glucose-Capillary: 185 mg/dL — ABNORMAL HIGH (ref 70–99)
Glucose-Capillary: 75 mg/dL (ref 70–99)

## 2010-10-13 LAB — CBC
HCT: 26.2 % — ABNORMAL LOW (ref 36.0–46.0)
HCT: 26.3 % — ABNORMAL LOW (ref 36.0–46.0)
HCT: 26.7 % — ABNORMAL LOW (ref 36.0–46.0)
HCT: 26.7 % — ABNORMAL LOW (ref 36.0–46.0)
HCT: 26.8 % — ABNORMAL LOW (ref 36.0–46.0)
HCT: 27.8 % — ABNORMAL LOW (ref 36.0–46.0)
HCT: 27.9 % — ABNORMAL LOW (ref 36.0–46.0)
HCT: 28.9 % — ABNORMAL LOW (ref 36.0–46.0)
Hemoglobin: 8.3 g/dL — ABNORMAL LOW (ref 12.0–15.0)
Hemoglobin: 8.3 g/dL — ABNORMAL LOW (ref 12.0–15.0)
Hemoglobin: 8.4 g/dL — ABNORMAL LOW (ref 12.0–15.0)
Hemoglobin: 8.5 g/dL — ABNORMAL LOW (ref 12.0–15.0)
Hemoglobin: 8.7 g/dL — ABNORMAL LOW (ref 12.0–15.0)
MCH: 26.4 pg (ref 26.0–34.0)
MCH: 26.9 pg (ref 26.0–34.0)
MCH: 27.1 pg (ref 26.0–34.0)
MCH: 27.5 pg (ref 26.0–34.0)
MCHC: 30.8 g/dL (ref 30.0–36.0)
MCHC: 30.9 g/dL (ref 30.0–36.0)
MCHC: 31 g/dL (ref 30.0–36.0)
MCHC: 31.3 g/dL (ref 30.0–36.0)
MCHC: 31.6 g/dL (ref 30.0–36.0)
MCHC: 31.7 g/dL (ref 30.0–36.0)
MCHC: 31.7 g/dL (ref 30.0–36.0)
MCHC: 32.6 g/dL (ref 30.0–36.0)
MCV: 82.5 fL (ref 78.0–100.0)
MCV: 84 fL (ref 78.0–100.0)
MCV: 85.5 fL (ref 78.0–100.0)
MCV: 86.1 fL (ref 78.0–100.0)
MCV: 86.3 fL (ref 78.0–100.0)
MCV: 86.5 fL (ref 78.0–100.0)
MCV: 87 fL (ref 78.0–100.0)
Platelets: 360 10*3/uL (ref 150–400)
Platelets: 432 10*3/uL — ABNORMAL HIGH (ref 150–400)
Platelets: 443 10*3/uL — ABNORMAL HIGH (ref 150–400)
Platelets: 445 10*3/uL — ABNORMAL HIGH (ref 150–400)
Platelets: 481 10*3/uL — ABNORMAL HIGH (ref 150–400)
Platelets: 483 10*3/uL — ABNORMAL HIGH (ref 150–400)
Platelets: 484 10*3/uL — ABNORMAL HIGH (ref 150–400)
RBC: 2.99 MIL/uL — ABNORMAL LOW (ref 3.87–5.11)
RBC: 3.12 MIL/uL — ABNORMAL LOW (ref 3.87–5.11)
RBC: 3.16 MIL/uL — ABNORMAL LOW (ref 3.87–5.11)
RBC: 3.19 MIL/uL — ABNORMAL LOW (ref 3.87–5.11)
RBC: 3.32 MIL/uL — ABNORMAL LOW (ref 3.87–5.11)
RBC: 3.33 MIL/uL — ABNORMAL LOW (ref 3.87–5.11)
RBC: 3.37 MIL/uL — ABNORMAL LOW (ref 3.87–5.11)
RDW: 17 % — ABNORMAL HIGH (ref 11.5–15.5)
RDW: 17.1 % — ABNORMAL HIGH (ref 11.5–15.5)
RDW: 17.3 % — ABNORMAL HIGH (ref 11.5–15.5)
RDW: 17.5 % — ABNORMAL HIGH (ref 11.5–15.5)
RDW: 17.6 % — ABNORMAL HIGH (ref 11.5–15.5)
RDW: 17.7 % — ABNORMAL HIGH (ref 11.5–15.5)
RDW: 17.7 % — ABNORMAL HIGH (ref 11.5–15.5)
RDW: 17.9 % — ABNORMAL HIGH (ref 11.5–15.5)
RDW: 18.1 % — ABNORMAL HIGH (ref 11.5–15.5)
WBC: 10.4 10*3/uL (ref 4.0–10.5)
WBC: 10.7 10*3/uL — ABNORMAL HIGH (ref 4.0–10.5)
WBC: 6.9 10*3/uL (ref 4.0–10.5)
WBC: 6.9 10*3/uL (ref 4.0–10.5)
WBC: 7.1 10*3/uL (ref 4.0–10.5)
WBC: 7.9 10*3/uL (ref 4.0–10.5)

## 2010-10-13 LAB — COMPREHENSIVE METABOLIC PANEL
ALT: 10 U/L (ref 0–35)
ALT: 10 U/L (ref 0–35)
ALT: 12 U/L (ref 0–35)
ALT: 8 U/L (ref 0–35)
ALT: 9 U/L (ref 0–35)
AST: 11 U/L (ref 0–37)
AST: 13 U/L (ref 0–37)
AST: 14 U/L (ref 0–37)
AST: 18 U/L (ref 0–37)
Albumin: 1.9 g/dL — ABNORMAL LOW (ref 3.5–5.2)
Albumin: 2 g/dL — ABNORMAL LOW (ref 3.5–5.2)
Albumin: 2.1 g/dL — ABNORMAL LOW (ref 3.5–5.2)
Albumin: 2.1 g/dL — ABNORMAL LOW (ref 3.5–5.2)
Alkaline Phosphatase: 103 U/L (ref 39–117)
Alkaline Phosphatase: 124 U/L — ABNORMAL HIGH (ref 39–117)
Alkaline Phosphatase: 87 U/L (ref 39–117)
BUN: 10 mg/dL (ref 6–23)
BUN: 13 mg/dL (ref 6–23)
CO2: 23 mEq/L (ref 19–32)
Calcium: 8.6 mg/dL (ref 8.4–10.5)
Calcium: 8.8 mg/dL (ref 8.4–10.5)
Chloride: 103 mEq/L (ref 96–112)
Chloride: 106 mEq/L (ref 96–112)
Chloride: 107 mEq/L (ref 96–112)
GFR calc Af Amer: 36 mL/min — ABNORMAL LOW (ref >60)
GFR calc Af Amer: 38 mL/min — ABNORMAL LOW (ref >60)
GFR calc Af Amer: 57 mL/min — ABNORMAL LOW (ref >60)
GFR calc Af Amer: >60 mL/min (ref >60)
GFR calc non Af Amer: 30 mL/min — ABNORMAL LOW (ref >60)
Glucose, Bld: 116 mg/dL — ABNORMAL HIGH (ref 70–99)
Glucose, Bld: 135 mg/dL — ABNORMAL HIGH (ref 70–99)
Potassium: 3.9 mEq/L (ref 3.5–5.1)
Potassium: 3.9 mEq/L (ref 3.5–5.1)
Potassium: 4 mEq/L (ref 3.5–5.1)
Potassium: 4.1 mEq/L (ref 3.5–5.1)
Sodium: 135 mEq/L (ref 135–145)
Sodium: 136 mEq/L (ref 135–145)
Sodium: 137 mEq/L (ref 135–145)
Sodium: 138 mEq/L (ref 135–145)
Sodium: 138 mEq/L (ref 135–145)
Total Bilirubin: 0.5 mg/dL (ref 0.3–1.2)
Total Bilirubin: 0.5 mg/dL (ref 0.3–1.2)
Total Protein: 6.7 g/dL (ref 6.0–8.3)
Total Protein: 6.8 g/dL (ref 6.0–8.3)
Total Protein: 7 g/dL (ref 6.0–8.3)
Total Protein: 7 g/dL (ref 6.0–8.3)

## 2010-10-13 LAB — HEMOGLOBIN A1C: Hgb A1c MFr Bld: 6.1 % — ABNORMAL HIGH (ref <5.7)

## 2010-10-13 LAB — DIFFERENTIAL
Basophils Absolute: 0 10*3/uL (ref 0.0–0.1)
Basophils Absolute: 0 10*3/uL (ref 0.0–0.1)
Basophils Absolute: 0.1 10*3/uL (ref 0.0–0.1)
Basophils Relative: 0 % (ref 0–1)
Basophils Relative: 0 % (ref 0–1)
Basophils Relative: 0 % (ref 0–1)
Basophils Relative: 1 % (ref 0–1)
Eosinophils Absolute: 0.4 10*3/uL (ref 0.0–0.7)
Eosinophils Absolute: 0.4 10*3/uL (ref 0.0–0.7)
Eosinophils Absolute: 0.4 10*3/uL (ref 0.0–0.7)
Eosinophils Relative: 0 % (ref 0–5)
Eosinophils Relative: 6 % — ABNORMAL HIGH (ref 0–5)
Eosinophils Relative: 6 % — ABNORMAL HIGH (ref 0–5)
Lymphocytes Relative: 11 % — ABNORMAL LOW (ref 12–46)
Lymphocytes Relative: 16 % (ref 12–46)
Lymphocytes Relative: 9 % — ABNORMAL LOW (ref 12–46)
Lymphs Abs: 0.9 10*3/uL (ref 0.7–4.0)
Lymphs Abs: 1.3 10*3/uL (ref 0.7–4.0)
Monocytes Absolute: 0.9 10*3/uL (ref 0.1–1.0)
Monocytes Absolute: 1 10*3/uL (ref 0.1–1.0)
Monocytes Absolute: 1.2 10*3/uL — ABNORMAL HIGH (ref 0.1–1.0)
Monocytes Absolute: 1.3 10*3/uL — ABNORMAL HIGH (ref 0.1–1.0)
Monocytes Absolute: 1.3 10*3/uL — ABNORMAL HIGH (ref 0.1–1.0)
Monocytes Relative: 10 % (ref 3–12)
Monocytes Relative: 12 % (ref 3–12)
Monocytes Relative: 16 % — ABNORMAL HIGH (ref 3–12)
Monocytes Relative: 17 % — ABNORMAL HIGH (ref 3–12)
Monocytes Relative: 19 % — ABNORMAL HIGH (ref 3–12)
Neutro Abs: 4.2 10*3/uL (ref 1.7–7.7)
Neutro Abs: 4.9 10*3/uL (ref 1.7–7.7)
Neutro Abs: 4.9 10*3/uL (ref 1.7–7.7)
Neutro Abs: 5.2 10*3/uL (ref 1.7–7.7)
Neutrophils Relative %: 66 % (ref 43–77)
Neutrophils Relative %: 67 % (ref 43–77)

## 2010-10-13 LAB — BRAIN NATRIURETIC PEPTIDE: Pro B Natriuretic peptide (BNP): 431 pg/mL — ABNORMAL HIGH (ref 0.0–100.0)

## 2010-10-13 LAB — MRSA PCR SCREENING: MRSA by PCR: NEGATIVE

## 2010-10-13 LAB — CULTURE, BLOOD (ROUTINE X 2)
Culture  Setup Time: 201112170338
Culture: NO GROWTH
Culture: NO GROWTH

## 2010-10-13 LAB — BASIC METABOLIC PANEL
BUN: 10 mg/dL (ref 6–23)
BUN: 14 mg/dL (ref 6–23)
BUN: 14 mg/dL (ref 6–23)
BUN: 15 mg/dL (ref 6–23)
BUN: 16 mg/dL (ref 6–23)
CO2: 21 mEq/L (ref 19–32)
CO2: 22 mEq/L (ref 19–32)
CO2: 22 mEq/L (ref 19–32)
CO2: 23 mEq/L (ref 19–32)
CO2: 23 mEq/L (ref 19–32)
Calcium: 8.2 mg/dL — ABNORMAL LOW (ref 8.4–10.5)
Calcium: 8.4 mg/dL (ref 8.4–10.5)
Calcium: 8.5 mg/dL (ref 8.4–10.5)
Calcium: 8.6 mg/dL (ref 8.4–10.5)
Chloride: 100 mEq/L (ref 96–112)
Chloride: 105 mEq/L (ref 96–112)
Chloride: 106 mEq/L (ref 96–112)
Chloride: 107 mEq/L (ref 96–112)
Creatinine, Ser: 0.6 mg/dL (ref 0.4–1.2)
Creatinine, Ser: 1.5 mg/dL — ABNORMAL HIGH (ref 0.4–1.2)
Creatinine, Ser: 1.76 mg/dL — ABNORMAL HIGH (ref 0.4–1.2)
GFR calc Af Amer: 34 mL/min — ABNORMAL LOW (ref >60)
GFR calc Af Amer: 38 mL/min — ABNORMAL LOW (ref >60)
GFR calc Af Amer: 41 mL/min — ABNORMAL LOW (ref >60)
GFR calc Af Amer: >60 mL/min (ref >60)
GFR calc non Af Amer: 28 mL/min — ABNORMAL LOW (ref >60)
GFR calc non Af Amer: 31 mL/min — ABNORMAL LOW (ref >60)
GFR calc non Af Amer: 31 mL/min — ABNORMAL LOW (ref >60)
GFR calc non Af Amer: >60 mL/min (ref >60)
Glucose, Bld: 100 mg/dL — ABNORMAL HIGH (ref 70–99)
Glucose, Bld: 141 mg/dL — ABNORMAL HIGH (ref 70–99)
Glucose, Bld: 144 mg/dL — ABNORMAL HIGH (ref 70–99)
Glucose, Bld: 163 mg/dL — ABNORMAL HIGH (ref 70–99)
Glucose, Bld: 194 mg/dL — ABNORMAL HIGH (ref 70–99)
Glucose, Bld: 72 mg/dL (ref 70–99)
Potassium: 3.5 mEq/L (ref 3.5–5.1)
Potassium: 4 mEq/L (ref 3.5–5.1)
Potassium: 4.2 mEq/L (ref 3.5–5.1)
Sodium: 131 mEq/L — ABNORMAL LOW (ref 135–145)
Sodium: 135 mEq/L (ref 135–145)
Sodium: 137 mEq/L (ref 135–145)

## 2010-10-13 LAB — URINE CULTURE
Culture  Setup Time: 201112170056
Culture: NO GROWTH

## 2010-10-13 LAB — ALBUMIN, FLUID (OTHER): Albumin, Fluid: 1.7 g/dL

## 2010-10-13 LAB — VITAMIN B12: Vitamin B-12: 210 pg/mL — ABNORMAL LOW (ref 211–911)

## 2010-10-13 LAB — QUANTIFERON TB GOLD ASSAY (BLOOD)
Quantiferon Nil Value: 0.02 IU/mL
TB Antigen Minus Nil Value: 0 IU/mL

## 2010-10-13 LAB — LACTATE DEHYDROGENASE, ISOENZYMES
LD1/LD2 Ratio: 0.69
LDH 4: 8 % (ref 3–12)
LDH Isoenzymes, Total: 148 U/L (ref 120–250)

## 2010-10-13 LAB — BODY FLUID CELL COUNT WITH DIFFERENTIAL: Eos, Fluid: 2 %

## 2010-10-13 LAB — PROTEIN, TOTAL: Total Protein: 6.9 g/dL (ref 6.0–8.3)

## 2010-10-13 LAB — IRON AND TIBC

## 2010-10-13 LAB — HEPATIC FUNCTION PANEL
AST: 17 U/L (ref 0–37)
Albumin: 2.1 g/dL — ABNORMAL LOW (ref 3.5–5.2)
Alkaline Phosphatase: 78 U/L (ref 39–117)
Bilirubin, Direct: 0.1 mg/dL (ref 0.0–0.3)
Indirect Bilirubin: 0.5 mg/dL (ref 0.3–0.9)
Total Bilirubin: 0.3 mg/dL (ref 0.3–1.2)
Total Protein: 7.1 g/dL (ref 6.0–8.3)

## 2010-10-13 LAB — CLOSTRIDIUM DIFFICILE BY PCR: Toxigenic C. Difficile by PCR: NEGATIVE

## 2010-10-13 LAB — BODY FLUID CULTURE

## 2010-10-13 LAB — URINALYSIS, ROUTINE W REFLEX MICROSCOPIC
Bilirubin Urine: NEGATIVE
Nitrite: NEGATIVE
Specific Gravity, Urine: 1.023 (ref 1.005–1.030)
pH: 5.5 (ref 5.0–8.0)

## 2010-10-13 LAB — LACTIC ACID, PLASMA: Lactic Acid, Venous: 0.8 mmol/L (ref 0.5–2.2)

## 2010-10-13 LAB — LACTATE DEHYDROGENASE, PLEURAL OR PERITONEAL FLUID

## 2010-10-13 LAB — PATHOLOGIST SMEAR REVIEW

## 2010-10-13 LAB — PROCALCITONIN: Procalcitonin: <0.1 ng/mL

## 2010-10-13 LAB — AMYLASE: Amylase: 68 U/L (ref 0–105)

## 2010-10-13 LAB — URINE MICROSCOPIC-ADD ON

## 2010-10-13 LAB — LIPASE, BLOOD: Lipase: 59 U/L (ref 11–59)

## 2010-10-13 LAB — FOLATE: Folate: >20 ng/mL

## 2010-10-13 LAB — MAGNESIUM: Magnesium: 1.6 mg/dL (ref 1.5–2.5)

## 2010-10-13 NOTE — Telephone Encounter (Signed)
Note written for patient.

## 2010-10-13 NOTE — Telephone Encounter (Signed)
Per Dr [name] Never, OK to write letter to [facility] for pt to have a letter, daughter will pick-up

## 2010-10-14 LAB — BASIC METABOLIC PANEL
BUN: 14 mg/dL (ref 6–23)
BUN: 17 mg/dL (ref 6–23)
BUN: 19 mg/dL (ref 6–23)
BUN: 20 mg/dL (ref 6–23)
BUN: 20 mg/dL (ref 6–23)
BUN: 22 mg/dL (ref 6–23)
BUN: 27 mg/dL — ABNORMAL HIGH (ref 6–23)
BUN: 33 mg/dL — ABNORMAL HIGH (ref 6–23)
BUN: 36 mg/dL — ABNORMAL HIGH (ref 6–23)
BUN: 39 mg/dL — ABNORMAL HIGH (ref 6–23)
BUN: 40 mg/dL — ABNORMAL HIGH (ref 6–23)
CO2: 28 mEq/L (ref 19–32)
CO2: 23 mEq/L (ref 19–32)
CO2: 24 mEq/L (ref 19–32)
CO2: 24 mEq/L (ref 19–32)
CO2: 25 mEq/L (ref 19–32)
CO2: 26 mEq/L (ref 19–32)
CO2: 26 mEq/L (ref 19–32)
CO2: 27 mEq/L (ref 19–32)
CO2: 28 mEq/L (ref 19–32)
CO2: 30 mEq/L (ref 19–32)
Calcium: 8.7 mg/dL (ref 8.4–10.5)
Calcium: 8.7 mg/dL (ref 8.4–10.5)
Calcium: 7.6 mg/dL — ABNORMAL LOW (ref 8.4–10.5)
Calcium: 7.6 mg/dL — ABNORMAL LOW (ref 8.4–10.5)
Calcium: 7.7 mg/dL — ABNORMAL LOW (ref 8.4–10.5)
Calcium: 8 mg/dL — ABNORMAL LOW (ref 8.4–10.5)
Calcium: 8 mg/dL — ABNORMAL LOW (ref 8.4–10.5)
Calcium: 8.1 mg/dL — ABNORMAL LOW (ref 8.4–10.5)
Calcium: 8.6 mg/dL (ref 8.4–10.5)
Calcium: 8.6 mg/dL (ref 8.4–10.5)
Calcium: 8.7 mg/dL (ref 8.4–10.5)
Calcium: 9.4 mg/dL (ref 8.4–10.5)
Chloride: 101 mEq/L (ref 96–112)
Chloride: 103 mEq/L (ref 96–112)
Chloride: 103 mEq/L (ref 96–112)
Chloride: 110 mEq/L (ref 96–112)
Chloride: 98 mEq/L (ref 96–112)
Creatinine, Ser: 0.68 mg/dL (ref 0.4–1.2)
Creatinine, Ser: 0.66 mg/dL (ref 0.4–1.2)
Creatinine, Ser: 0.74 mg/dL (ref 0.4–1.2)
Creatinine, Ser: 0.82 mg/dL (ref 0.4–1.2)
Creatinine, Ser: 0.9 mg/dL (ref 0.4–1.2)
Creatinine, Ser: 0.93 mg/dL (ref 0.4–1.2)
Creatinine, Ser: 1.11 mg/dL (ref 0.4–1.2)
Creatinine, Ser: 1.15 mg/dL (ref 0.4–1.2)
Creatinine, Ser: 1.22 mg/dL — ABNORMAL HIGH (ref 0.4–1.2)
Creatinine, Ser: 1.53 mg/dL — ABNORMAL HIGH (ref 0.4–1.2)
GFR calc Af Amer: 50 mL/min — ABNORMAL LOW (ref >60)
GFR calc Af Amer: 56 mL/min — ABNORMAL LOW (ref >60)
GFR calc Af Amer: 58 mL/min — ABNORMAL LOW (ref >60)
GFR calc Af Amer: >60 mL/min (ref >60)
GFR calc Af Amer: >60 mL/min (ref >60)
GFR calc Af Amer: >60 mL/min (ref >60)
GFR calc Af Amer: >60 mL/min (ref >60)
GFR calc Af Amer: >60 mL/min (ref >60)
GFR calc non Af Amer: >60 mL/min (ref >60)
GFR calc non Af Amer: >60 mL/min (ref >60)
GFR calc non Af Amer: 33 mL/min — ABNORMAL LOW (ref >60)
GFR calc non Af Amer: 41 mL/min — ABNORMAL LOW (ref >60)
GFR calc non Af Amer: 43 mL/min — ABNORMAL LOW (ref >60)
GFR calc non Af Amer: 46 mL/min — ABNORMAL LOW (ref >60)
GFR calc non Af Amer: 48 mL/min — ABNORMAL LOW (ref >60)
GFR calc non Af Amer: >60 mL/min (ref >60)
GFR calc non Af Amer: >60 mL/min (ref >60)
GFR calc non Af Amer: >60 mL/min (ref >60)
GFR calc non Af Amer: >60 mL/min (ref >60)
GFR calc non Af Amer: >60 mL/min (ref >60)
GFR calc non Af Amer: >60 mL/min (ref >60)
Glucose, Bld: 53 mg/dL — ABNORMAL LOW (ref 70–99)
Glucose, Bld: 103 mg/dL — ABNORMAL HIGH (ref 70–99)
Glucose, Bld: 105 mg/dL — ABNORMAL HIGH (ref 70–99)
Glucose, Bld: 130 mg/dL — ABNORMAL HIGH (ref 70–99)
Glucose, Bld: 134 mg/dL — ABNORMAL HIGH (ref 70–99)
Glucose, Bld: 169 mg/dL — ABNORMAL HIGH (ref 70–99)
Glucose, Bld: 178 mg/dL — ABNORMAL HIGH (ref 70–99)
Glucose, Bld: 395 mg/dL — ABNORMAL HIGH (ref 70–99)
Glucose, Bld: 74 mg/dL (ref 70–99)
Glucose, Bld: 78 mg/dL (ref 70–99)
Glucose, Bld: 98 mg/dL (ref 70–99)
Potassium: 3.7 mEq/L (ref 3.5–5.1)
Potassium: 3.7 mEq/L (ref 3.5–5.1)
Potassium: 3.9 mEq/L (ref 3.5–5.1)
Potassium: 4 mEq/L (ref 3.5–5.1)
Potassium: 4.2 mEq/L (ref 3.5–5.1)
Potassium: 4.3 mEq/L (ref 3.5–5.1)
Potassium: 4.3 mEq/L (ref 3.5–5.1)
Potassium: 4.4 mEq/L (ref 3.5–5.1)
Potassium: 4.4 mEq/L (ref 3.5–5.1)
Sodium: 136 mEq/L (ref 135–145)
Sodium: 130 mEq/L — ABNORMAL LOW (ref 135–145)
Sodium: 134 mEq/L — ABNORMAL LOW (ref 135–145)
Sodium: 135 mEq/L (ref 135–145)
Sodium: 136 mEq/L (ref 135–145)
Sodium: 137 mEq/L (ref 135–145)
Sodium: 137 mEq/L (ref 135–145)
Sodium: 137 mEq/L (ref 135–145)
Sodium: 137 mEq/L (ref 135–145)
Sodium: 138 mEq/L (ref 135–145)
Sodium: 138 mEq/L (ref 135–145)

## 2010-10-14 LAB — CBC
HCT: 24 % — ABNORMAL LOW (ref 36.0–46.0)
HCT: 24.2 % — ABNORMAL LOW (ref 36.0–46.0)
HCT: 26.6 % — ABNORMAL LOW (ref 36.0–46.0)
HCT: 27.2 % — ABNORMAL LOW (ref 36.0–46.0)
HCT: 22.2 % — ABNORMAL LOW (ref 36.0–46.0)
HCT: 23.7 % — ABNORMAL LOW (ref 36.0–46.0)
HCT: 23.7 % — ABNORMAL LOW (ref 36.0–46.0)
HCT: 23.9 % — ABNORMAL LOW (ref 36.0–46.0)
HCT: 24 % — ABNORMAL LOW (ref 36.0–46.0)
HCT: 24 % — ABNORMAL LOW (ref 36.0–46.0)
HCT: 24.3 % — ABNORMAL LOW (ref 36.0–46.0)
HCT: 25.7 % — ABNORMAL LOW (ref 36.0–46.0)
HCT: 25.8 % — ABNORMAL LOW (ref 36.0–46.0)
HCT: 27.9 % — ABNORMAL LOW (ref 36.0–46.0)
HCT: 36.8 % (ref 36.0–46.0)
Hemoglobin: 7.4 g/dL — ABNORMAL LOW (ref 12.0–15.0)
Hemoglobin: 7.7 g/dL — ABNORMAL LOW (ref 12.0–15.0)
Hemoglobin: 7.8 g/dL — ABNORMAL LOW (ref 12.0–15.0)
Hemoglobin: 8.4 g/dL — ABNORMAL LOW (ref 12.0–15.0)
Hemoglobin: 8.4 g/dL — ABNORMAL LOW (ref 12.0–15.0)
Hemoglobin: 8.6 g/dL — ABNORMAL LOW (ref 12.0–15.0)
Hemoglobin: 12.6 g/dL (ref 12.0–15.0)
Hemoglobin: 13.1 g/dL (ref 12.0–15.0)
Hemoglobin: 7.2 g/dL — ABNORMAL LOW (ref 12.0–15.0)
Hemoglobin: 7.9 g/dL — ABNORMAL LOW (ref 12.0–15.0)
Hemoglobin: 8 g/dL — ABNORMAL LOW (ref 12.0–15.0)
Hemoglobin: 8 g/dL — ABNORMAL LOW (ref 12.0–15.0)
Hemoglobin: 8.1 g/dL — ABNORMAL LOW (ref 12.0–15.0)
Hemoglobin: 8.1 g/dL — ABNORMAL LOW (ref 12.0–15.0)
Hemoglobin: 8.1 g/dL — ABNORMAL LOW (ref 12.0–15.0)
Hemoglobin: 8.3 g/dL — ABNORMAL LOW (ref 12.0–15.0)
Hemoglobin: 8.4 g/dL — ABNORMAL LOW (ref 12.0–15.0)
Hemoglobin: 8.5 g/dL — ABNORMAL LOW (ref 12.0–15.0)
Hemoglobin: 9.2 g/dL — ABNORMAL LOW (ref 12.0–15.0)
Hemoglobin: 9.7 g/dL — ABNORMAL LOW (ref 12.0–15.0)
MCH: 27.9 pg (ref 26.0–34.0)
MCH: 27.9 pg (ref 26.0–34.0)
MCH: 28 pg (ref 26.0–34.0)
MCH: 29.1 pg (ref 26.0–34.0)
MCH: 29.3 pg (ref 26.0–34.0)
MCH: 29.6 pg (ref 26.0–34.0)
MCH: 30.2 pg (ref 26.0–34.0)
MCH: 30.4 pg (ref 26.0–34.0)
MCH: 30.4 pg (ref 26.0–34.0)
MCH: 30.4 pg (ref 26.0–34.0)
MCH: 30.5 pg (ref 26.0–34.0)
MCH: 30.7 pg (ref 26.0–34.0)
MCH: 31.2 pg (ref 26.0–34.0)
MCH: 31.4 pg (ref 26.0–34.0)
MCH: 31.4 pg (ref 26.0–34.0)
MCH: 31.7 pg (ref 26.0–34.0)
MCH: 31.8 pg (ref 26.0–34.0)
MCH: 32 pg (ref 26.0–34.0)
MCH: 32.2 pg (ref 26.0–34.0)
MCH: 32.6 pg (ref 26.0–34.0)
MCHC: 31.8 g/dL (ref 30.0–36.0)
MCHC: 31.9 g/dL (ref 30.0–36.0)
MCHC: 32.1 g/dL (ref 30.0–36.0)
MCHC: 32.1 g/dL (ref 30.0–36.0)
MCHC: 32.2 g/dL (ref 30.0–36.0)
MCHC: 32.4 g/dL (ref 30.0–36.0)
MCHC: 32.7 g/dL (ref 30.0–36.0)
MCHC: 33.3 g/dL (ref 30.0–36.0)
MCHC: 33.6 g/dL (ref 30.0–36.0)
MCHC: 33.8 g/dL (ref 30.0–36.0)
MCHC: 33.8 g/dL (ref 30.0–36.0)
MCHC: 33.9 g/dL (ref 30.0–36.0)
MCHC: 33.9 g/dL (ref 30.0–36.0)
MCHC: 33.9 g/dL (ref 30.0–36.0)
MCHC: 34.3 g/dL (ref 30.0–36.0)
MCHC: 34.8 g/dL (ref 30.0–36.0)
MCV: 86.6 fL (ref 78.0–100.0)
MCV: 87.5 fL (ref 78.0–100.0)
MCV: 87.7 fL (ref 78.0–100.0)
MCV: 91.7 fL (ref 78.0–100.0)
MCV: 92.3 fL (ref 78.0–100.0)
MCV: 92.1 fL (ref 78.0–100.0)
MCV: 92.1 fL (ref 78.0–100.0)
MCV: 92.2 fL (ref 78.0–100.0)
MCV: 92.3 fL (ref 78.0–100.0)
MCV: 92.9 fL (ref 78.0–100.0)
MCV: 93.1 fL (ref 78.0–100.0)
MCV: 93.7 fL (ref 78.0–100.0)
MCV: 94.8 fL (ref 78.0–100.0)
MCV: 94.9 fL (ref 78.0–100.0)
Platelets: 373 10*3/uL (ref 150–400)
Platelets: 431 10*3/uL — ABNORMAL HIGH (ref 150–400)
Platelets: 445 10*3/uL — ABNORMAL HIGH (ref 150–400)
Platelets: 455 10*3/uL — ABNORMAL HIGH (ref 150–400)
Platelets: 463 10*3/uL — ABNORMAL HIGH (ref 150–400)
Platelets: 501 10*3/uL — ABNORMAL HIGH (ref 150–400)
Platelets: 129 10*3/uL — ABNORMAL LOW (ref 150–400)
Platelets: 145 10*3/uL — ABNORMAL LOW (ref 150–400)
Platelets: 168 10*3/uL (ref 150–400)
Platelets: 213 10*3/uL (ref 150–400)
Platelets: 239 10*3/uL (ref 150–400)
Platelets: 253 10*3/uL (ref 150–400)
Platelets: 257 10*3/uL (ref 150–400)
Platelets: 372 10*3/uL (ref 150–400)
Platelets: 388 10*3/uL (ref 150–400)
Platelets: 411 10*3/uL — ABNORMAL HIGH (ref 150–400)
Platelets: 415 10*3/uL — ABNORMAL HIGH (ref 150–400)
Platelets: 501 10*3/uL — ABNORMAL HIGH (ref 150–400)
RBC: 2.66 MIL/uL — ABNORMAL LOW (ref 3.87–5.11)
RBC: 2.72 MIL/uL — ABNORMAL LOW (ref 3.87–5.11)
RBC: 2.76 MIL/uL — ABNORMAL LOW (ref 3.87–5.11)
RBC: 2.89 MIL/uL — ABNORMAL LOW (ref 3.87–5.11)
RBC: 2.37 MIL/uL — ABNORMAL LOW (ref 3.87–5.11)
RBC: 2.55 MIL/uL — ABNORMAL LOW (ref 3.87–5.11)
RBC: 2.55 MIL/uL — ABNORMAL LOW (ref 3.87–5.11)
RBC: 2.57 MIL/uL — ABNORMAL LOW (ref 3.87–5.11)
RBC: 2.6 MIL/uL — ABNORMAL LOW (ref 3.87–5.11)
RBC: 2.63 MIL/uL — ABNORMAL LOW (ref 3.87–5.11)
RBC: 2.72 MIL/uL — ABNORMAL LOW (ref 3.87–5.11)
RBC: 2.76 MIL/uL — ABNORMAL LOW (ref 3.87–5.11)
RBC: 2.87 MIL/uL — ABNORMAL LOW (ref 3.87–5.11)
RBC: 2.9 MIL/uL — ABNORMAL LOW (ref 3.87–5.11)
RBC: 3.06 MIL/uL — ABNORMAL LOW (ref 3.87–5.11)
RBC: 3.87 MIL/uL (ref 3.87–5.11)
RBC: 4.07 MIL/uL (ref 3.87–5.11)
RDW: 16.2 % — ABNORMAL HIGH (ref 11.5–15.5)
RDW: 16.2 % — ABNORMAL HIGH (ref 11.5–15.5)
RDW: 11.7 % (ref 11.5–15.5)
RDW: 11.8 % (ref 11.5–15.5)
RDW: 11.8 % (ref 11.5–15.5)
RDW: 13.4 % (ref 11.5–15.5)
RDW: 14.3 % (ref 11.5–15.5)
RDW: 14.5 % (ref 11.5–15.5)
RDW: 14.8 % (ref 11.5–15.5)
RDW: 14.9 % (ref 11.5–15.5)
RDW: 15 % (ref 11.5–15.5)
RDW: 15 % (ref 11.5–15.5)
RDW: 15.1 % (ref 11.5–15.5)
RDW: 15.2 % (ref 11.5–15.5)
RDW: 15.3 % (ref 11.5–15.5)
RDW: 15.8 % — ABNORMAL HIGH (ref 11.5–15.5)
WBC: 11.2 10*3/uL — ABNORMAL HIGH (ref 4.0–10.5)
WBC: 8.5 10*3/uL (ref 4.0–10.5)
WBC: 9.5 10*3/uL (ref 4.0–10.5)
WBC: 9.7 10*3/uL (ref 4.0–10.5)
WBC: 10.5 10*3/uL (ref 4.0–10.5)
WBC: 10.6 10*3/uL — ABNORMAL HIGH (ref 4.0–10.5)
WBC: 11.3 10*3/uL — ABNORMAL HIGH (ref 4.0–10.5)
WBC: 12.7 10*3/uL — ABNORMAL HIGH (ref 4.0–10.5)
WBC: 14.4 10*3/uL — ABNORMAL HIGH (ref 4.0–10.5)
WBC: 16.2 10*3/uL — ABNORMAL HIGH (ref 4.0–10.5)
WBC: 17.5 10*3/uL — ABNORMAL HIGH (ref 4.0–10.5)
WBC: 17.6 10*3/uL — ABNORMAL HIGH (ref 4.0–10.5)
WBC: 17.7 10*3/uL — ABNORMAL HIGH (ref 4.0–10.5)
WBC: 20.5 10*3/uL — ABNORMAL HIGH (ref 4.0–10.5)
WBC: 21.6 10*3/uL — ABNORMAL HIGH (ref 4.0–10.5)
WBC: 23 10*3/uL — ABNORMAL HIGH (ref 4.0–10.5)

## 2010-10-14 LAB — GLUCOSE, CAPILLARY
Glucose-Capillary: 108 mg/dL — ABNORMAL HIGH (ref 70–99)
Glucose-Capillary: 110 mg/dL — ABNORMAL HIGH (ref 70–99)
Glucose-Capillary: 116 mg/dL — ABNORMAL HIGH (ref 70–99)
Glucose-Capillary: 118 mg/dL — ABNORMAL HIGH (ref 70–99)
Glucose-Capillary: 126 mg/dL — ABNORMAL HIGH (ref 70–99)
Glucose-Capillary: 127 mg/dL — ABNORMAL HIGH (ref 70–99)
Glucose-Capillary: 137 mg/dL — ABNORMAL HIGH (ref 70–99)
Glucose-Capillary: 138 mg/dL — ABNORMAL HIGH (ref 70–99)
Glucose-Capillary: 138 mg/dL — ABNORMAL HIGH (ref 70–99)
Glucose-Capillary: 144 mg/dL — ABNORMAL HIGH (ref 70–99)
Glucose-Capillary: 147 mg/dL — ABNORMAL HIGH (ref 70–99)
Glucose-Capillary: 150 mg/dL — ABNORMAL HIGH (ref 70–99)
Glucose-Capillary: 151 mg/dL — ABNORMAL HIGH (ref 70–99)
Glucose-Capillary: 155 mg/dL — ABNORMAL HIGH (ref 70–99)
Glucose-Capillary: 157 mg/dL — ABNORMAL HIGH (ref 70–99)
Glucose-Capillary: 157 mg/dL — ABNORMAL HIGH (ref 70–99)
Glucose-Capillary: 166 mg/dL — ABNORMAL HIGH (ref 70–99)
Glucose-Capillary: 167 mg/dL — ABNORMAL HIGH (ref 70–99)
Glucose-Capillary: 171 mg/dL — ABNORMAL HIGH (ref 70–99)
Glucose-Capillary: 174 mg/dL — ABNORMAL HIGH (ref 70–99)
Glucose-Capillary: 176 mg/dL — ABNORMAL HIGH (ref 70–99)
Glucose-Capillary: 178 mg/dL — ABNORMAL HIGH (ref 70–99)
Glucose-Capillary: 178 mg/dL — ABNORMAL HIGH (ref 70–99)
Glucose-Capillary: 195 mg/dL — ABNORMAL HIGH (ref 70–99)
Glucose-Capillary: 264 mg/dL — ABNORMAL HIGH (ref 70–99)
Glucose-Capillary: 71 mg/dL (ref 70–99)
Glucose-Capillary: 100 mg/dL — ABNORMAL HIGH (ref 70–99)
Glucose-Capillary: 101 mg/dL — ABNORMAL HIGH (ref 70–99)
Glucose-Capillary: 104 mg/dL — ABNORMAL HIGH (ref 70–99)
Glucose-Capillary: 105 mg/dL — ABNORMAL HIGH (ref 70–99)
Glucose-Capillary: 105 mg/dL — ABNORMAL HIGH (ref 70–99)
Glucose-Capillary: 109 mg/dL — ABNORMAL HIGH (ref 70–99)
Glucose-Capillary: 109 mg/dL — ABNORMAL HIGH (ref 70–99)
Glucose-Capillary: 111 mg/dL — ABNORMAL HIGH (ref 70–99)
Glucose-Capillary: 112 mg/dL — ABNORMAL HIGH (ref 70–99)
Glucose-Capillary: 116 mg/dL — ABNORMAL HIGH (ref 70–99)
Glucose-Capillary: 117 mg/dL — ABNORMAL HIGH (ref 70–99)
Glucose-Capillary: 118 mg/dL — ABNORMAL HIGH (ref 70–99)
Glucose-Capillary: 123 mg/dL — ABNORMAL HIGH (ref 70–99)
Glucose-Capillary: 124 mg/dL — ABNORMAL HIGH (ref 70–99)
Glucose-Capillary: 131 mg/dL — ABNORMAL HIGH (ref 70–99)
Glucose-Capillary: 138 mg/dL — ABNORMAL HIGH (ref 70–99)
Glucose-Capillary: 139 mg/dL — ABNORMAL HIGH (ref 70–99)
Glucose-Capillary: 140 mg/dL — ABNORMAL HIGH (ref 70–99)
Glucose-Capillary: 141 mg/dL — ABNORMAL HIGH (ref 70–99)
Glucose-Capillary: 144 mg/dL — ABNORMAL HIGH (ref 70–99)
Glucose-Capillary: 144 mg/dL — ABNORMAL HIGH (ref 70–99)
Glucose-Capillary: 147 mg/dL — ABNORMAL HIGH (ref 70–99)
Glucose-Capillary: 155 mg/dL — ABNORMAL HIGH (ref 70–99)
Glucose-Capillary: 161 mg/dL — ABNORMAL HIGH (ref 70–99)
Glucose-Capillary: 161 mg/dL — ABNORMAL HIGH (ref 70–99)
Glucose-Capillary: 168 mg/dL — ABNORMAL HIGH (ref 70–99)
Glucose-Capillary: 175 mg/dL — ABNORMAL HIGH (ref 70–99)
Glucose-Capillary: 178 mg/dL — ABNORMAL HIGH (ref 70–99)
Glucose-Capillary: 182 mg/dL — ABNORMAL HIGH (ref 70–99)
Glucose-Capillary: 186 mg/dL — ABNORMAL HIGH (ref 70–99)
Glucose-Capillary: 194 mg/dL — ABNORMAL HIGH (ref 70–99)
Glucose-Capillary: 209 mg/dL — ABNORMAL HIGH (ref 70–99)
Glucose-Capillary: 214 mg/dL — ABNORMAL HIGH (ref 70–99)
Glucose-Capillary: 218 mg/dL — ABNORMAL HIGH (ref 70–99)
Glucose-Capillary: 219 mg/dL — ABNORMAL HIGH (ref 70–99)
Glucose-Capillary: 290 mg/dL — ABNORMAL HIGH (ref 70–99)
Glucose-Capillary: 328 mg/dL — ABNORMAL HIGH (ref 70–99)
Glucose-Capillary: 328 mg/dL — ABNORMAL HIGH (ref 70–99)
Glucose-Capillary: 328 mg/dL — ABNORMAL HIGH (ref 70–99)
Glucose-Capillary: 372 mg/dL — ABNORMAL HIGH (ref 70–99)
Glucose-Capillary: 377 mg/dL — ABNORMAL HIGH (ref 70–99)
Glucose-Capillary: 67 mg/dL — ABNORMAL LOW (ref 70–99)
Glucose-Capillary: 74 mg/dL (ref 70–99)
Glucose-Capillary: 74 mg/dL (ref 70–99)
Glucose-Capillary: 77 mg/dL (ref 70–99)
Glucose-Capillary: 79 mg/dL (ref 70–99)
Glucose-Capillary: 85 mg/dL (ref 70–99)
Glucose-Capillary: 86 mg/dL (ref 70–99)
Glucose-Capillary: 86 mg/dL (ref 70–99)
Glucose-Capillary: 88 mg/dL (ref 70–99)
Glucose-Capillary: 93 mg/dL (ref 70–99)
Glucose-Capillary: 97 mg/dL (ref 70–99)
Glucose-Capillary: 99 mg/dL (ref 70–99)

## 2010-10-14 LAB — POCT I-STAT 4, (NA,K, GLUC, HGB,HCT)
Glucose, Bld: 180 mg/dL — ABNORMAL HIGH (ref 70–99)
Glucose, Bld: 288 mg/dL — ABNORMAL HIGH (ref 70–99)
HCT: 26 % — ABNORMAL LOW (ref 36.0–46.0)
HCT: 34 % — ABNORMAL LOW (ref 36.0–46.0)
Hemoglobin: 12.6 g/dL (ref 12.0–15.0)
Hemoglobin: 8.8 g/dL — ABNORMAL LOW (ref 12.0–15.0)
Hemoglobin: 9.2 g/dL — ABNORMAL LOW (ref 12.0–15.0)
Potassium: 2.8 mEq/L — ABNORMAL LOW (ref 3.5–5.1)
Potassium: 3.4 mEq/L — ABNORMAL LOW (ref 3.5–5.1)
Potassium: 3.7 mEq/L (ref 3.5–5.1)
Potassium: 3.8 mEq/L (ref 3.5–5.1)
Potassium: 4 mEq/L (ref 3.5–5.1)
Sodium: 138 mEq/L (ref 135–145)
Sodium: 138 mEq/L (ref 135–145)
Sodium: 138 mEq/L (ref 135–145)
Sodium: 141 mEq/L (ref 135–145)

## 2010-10-14 LAB — CROSSMATCH
ABO/RH(D): A POS
ABO/RH(D): A POS
Antibody Screen: NEGATIVE
Unit division: 0
Unit division: 0

## 2010-10-14 LAB — DIFFERENTIAL
Basophils Absolute: 0 10*3/uL (ref 0.0–0.1)
Basophils Absolute: 0 10*3/uL (ref 0.0–0.1)
Basophils Absolute: 0 10*3/uL (ref 0.0–0.1)
Basophils Relative: 0 % (ref 0–1)
Basophils Relative: 0 % (ref 0–1)
Basophils Relative: 1 % (ref 0–1)
Eosinophils Absolute: 0.2 10*3/uL (ref 0.0–0.7)
Eosinophils Absolute: 0.2 10*3/uL (ref 0.0–0.7)
Eosinophils Relative: 2 % (ref 0–5)
Eosinophils Relative: 3 % (ref 0–5)
Lymphocytes Relative: 12 % (ref 12–46)
Lymphocytes Relative: 9 % — ABNORMAL LOW (ref 12–46)
Monocytes Absolute: 1.4 10*3/uL — ABNORMAL HIGH (ref 0.1–1.0)
Monocytes Absolute: 1.7 10*3/uL — ABNORMAL HIGH (ref 0.1–1.0)
Monocytes Absolute: 1.2 10*3/uL — ABNORMAL HIGH (ref 0.1–1.0)
Monocytes Relative: 17 % — ABNORMAL HIGH (ref 3–12)
Neutro Abs: 6.4 10*3/uL (ref 1.7–7.7)
Neutro Abs: 6.5 10*3/uL (ref 1.7–7.7)
Neutro Abs: 9.6 10*3/uL — ABNORMAL HIGH (ref 1.7–7.7)
Neutrophils Relative %: 68 % (ref 43–77)
Neutrophils Relative %: 61 % (ref 43–77)

## 2010-10-14 LAB — HEPATIC FUNCTION PANEL
AST: 16 U/L (ref 0–37)
Albumin: 2.6 g/dL — ABNORMAL LOW (ref 3.5–5.2)
Alkaline Phosphatase: 71 U/L (ref 39–117)
Bilirubin, Direct: 0.2 mg/dL (ref 0.0–0.3)
Total Bilirubin: 0.6 mg/dL (ref 0.3–1.2)

## 2010-10-14 LAB — COMPREHENSIVE METABOLIC PANEL
ALT: 11 U/L (ref 0–35)
ALT: 13 U/L (ref 0–35)
ALT: <1 U/L (ref 0–35)
ALT: 22 U/L (ref 0–35)
ALT: 25 U/L (ref 0–35)
ALT: 48 U/L — ABNORMAL HIGH (ref 0–35)
AST: 13 U/L (ref 0–37)
AST: 14 U/L (ref 0–37)
AST: 22 U/L (ref 0–37)
AST: 24 U/L (ref 0–37)
AST: 35 U/L (ref 0–37)
Albumin: 2.2 g/dL — ABNORMAL LOW (ref 3.5–5.2)
Albumin: 2.2 g/dL — ABNORMAL LOW (ref 3.5–5.2)
Albumin: 2.5 g/dL — ABNORMAL LOW (ref 3.5–5.2)
Albumin: 2.5 g/dL — ABNORMAL LOW (ref 3.5–5.2)
Albumin: 2.6 g/dL — ABNORMAL LOW (ref 3.5–5.2)
Albumin: 3 g/dL — ABNORMAL LOW (ref 3.5–5.2)
Alkaline Phosphatase: 58 U/L (ref 39–117)
Alkaline Phosphatase: 66 U/L (ref 39–117)
Alkaline Phosphatase: 67 U/L (ref 39–117)
Alkaline Phosphatase: 64 U/L (ref 39–117)
Alkaline Phosphatase: 82 U/L (ref 39–117)
BUN: 13 mg/dL (ref 6–23)
BUN: 20 mg/dL (ref 6–23)
BUN: 22 mg/dL (ref 6–23)
BUN: 17 mg/dL (ref 6–23)
BUN: 18 mg/dL (ref 6–23)
BUN: 19 mg/dL (ref 6–23)
CO2: 27 mEq/L (ref 19–32)
CO2: 28 mEq/L (ref 19–32)
CO2: 25 mEq/L (ref 19–32)
CO2: 25 mEq/L (ref 19–32)
CO2: 27 mEq/L (ref 19–32)
Calcium: 8.2 mg/dL — ABNORMAL LOW (ref 8.4–10.5)
Calcium: 8.9 mg/dL (ref 8.4–10.5)
Chloride: 102 mEq/L (ref 96–112)
Chloride: 98 mEq/L (ref 96–112)
Chloride: 99 mEq/L (ref 96–112)
Chloride: 99 mEq/L (ref 96–112)
Chloride: 102 mEq/L (ref 96–112)
Chloride: 102 mEq/L (ref 96–112)
Chloride: 103 mEq/L (ref 96–112)
Chloride: 98 mEq/L (ref 96–112)
Creatinine, Ser: 0.56 mg/dL (ref 0.4–1.2)
Creatinine, Ser: 0.71 mg/dL (ref 0.4–1.2)
Creatinine, Ser: 0.71 mg/dL (ref 0.4–1.2)
Creatinine, Ser: 0.78 mg/dL (ref 0.4–1.2)
GFR calc Af Amer: >60 mL/min (ref >60)
GFR calc Af Amer: >60 mL/min (ref >60)
GFR calc Af Amer: >60 mL/min (ref >60)
GFR calc Af Amer: >60 mL/min (ref >60)
GFR calc Af Amer: >60 mL/min (ref >60)
GFR calc non Af Amer: >60 mL/min (ref >60)
GFR calc non Af Amer: >60 mL/min (ref >60)
GFR calc non Af Amer: >60 mL/min (ref >60)
GFR calc non Af Amer: >60 mL/min (ref >60)
GFR calc non Af Amer: >60 mL/min (ref >60)
Glucose, Bld: 107 mg/dL — ABNORMAL HIGH (ref 70–99)
Glucose, Bld: 128 mg/dL — ABNORMAL HIGH (ref 70–99)
Glucose, Bld: 146 mg/dL — ABNORMAL HIGH (ref 70–99)
Glucose, Bld: 155 mg/dL — ABNORMAL HIGH (ref 70–99)
Glucose, Bld: 147 mg/dL — ABNORMAL HIGH (ref 70–99)
Glucose, Bld: 214 mg/dL — ABNORMAL HIGH (ref 70–99)
Glucose, Bld: 95 mg/dL (ref 70–99)
Potassium: 3.6 mEq/L (ref 3.5–5.1)
Potassium: 3.7 mEq/L (ref 3.5–5.1)
Potassium: 3.7 mEq/L (ref 3.5–5.1)
Potassium: 4.2 mEq/L (ref 3.5–5.1)
Potassium: 4.5 mEq/L (ref 3.5–5.1)
Sodium: 133 mEq/L — ABNORMAL LOW (ref 135–145)
Sodium: 135 mEq/L (ref 135–145)
Sodium: 137 mEq/L (ref 135–145)
Sodium: 133 mEq/L — ABNORMAL LOW (ref 135–145)
Sodium: 135 mEq/L (ref 135–145)
Sodium: 139 mEq/L (ref 135–145)
Total Bilirubin: 0.5 mg/dL (ref 0.3–1.2)
Total Bilirubin: 0.5 mg/dL (ref 0.3–1.2)
Total Bilirubin: 0.5 mg/dL (ref 0.3–1.2)
Total Bilirubin: 0.6 mg/dL (ref 0.3–1.2)
Total Bilirubin: 0.7 mg/dL (ref 0.3–1.2)
Total Bilirubin: 0.4 mg/dL (ref 0.3–1.2)
Total Bilirubin: 0.5 mg/dL (ref 0.3–1.2)
Total Bilirubin: 0.5 mg/dL (ref 0.3–1.2)
Total Bilirubin: 0.7 mg/dL (ref 0.3–1.2)
Total Protein: 6.3 g/dL (ref 6.0–8.3)
Total Protein: 6.6 g/dL (ref 6.0–8.3)
Total Protein: 5.9 g/dL — ABNORMAL LOW (ref 6.0–8.3)
Total Protein: 6 g/dL (ref 6.0–8.3)
Total Protein: 6.5 g/dL (ref 6.0–8.3)

## 2010-10-14 LAB — PREPARE PLATELETS: Unit division: 0

## 2010-10-14 LAB — POCT I-STAT 3, ART BLOOD GAS (G3+)
Acid-base deficit: 1 mmol/L (ref 0.0–2.0)
Acid-base deficit: 2 mmol/L (ref 0.0–2.0)
Bicarbonate: 23 mEq/L (ref 20.0–24.0)
O2 Saturation: 100 %
Patient temperature: 37
TCO2: 23 mmol/L (ref 0–100)
TCO2: 26 mmol/L (ref 0–100)
pCO2 arterial: 33.7 mmHg — ABNORMAL LOW (ref 35.0–45.0)
pH, Arterial: 7.34 — ABNORMAL LOW (ref 7.350–7.400)
pH, Arterial: 7.414 — ABNORMAL HIGH (ref 7.350–7.400)
pO2, Arterial: 364 mmHg — ABNORMAL HIGH (ref 80.0–100.0)

## 2010-10-14 LAB — URINALYSIS, ROUTINE W REFLEX MICROSCOPIC
Bilirubin Urine: NEGATIVE
Glucose, UA: NEGATIVE mg/dL
Hgb urine dipstick: NEGATIVE
Ketones, ur: NEGATIVE mg/dL
Nitrite: NEGATIVE
Protein, ur: NEGATIVE mg/dL
Specific Gravity, Urine: 1.021 (ref 1.005–1.030)
Urobilinogen, UA: 1 mg/dL (ref 0.0–1.0)
pH: 7 (ref 5.0–8.0)

## 2010-10-14 LAB — URINE CULTURE
Colony Count: 15000
Colony Count: NO GROWTH
Culture  Setup Time: 201111251053
Culture  Setup Time: 201111280011
Culture: NO GROWTH
Special Requests: NEGATIVE

## 2010-10-14 LAB — PROTIME-INR
INR: 1.22 (ref 0.00–1.49)
INR: 1.11 (ref 0.00–1.49)
INR: 1.56 — ABNORMAL HIGH (ref 0.00–1.49)
Prothrombin Time: 15.6 seconds — ABNORMAL HIGH (ref 11.6–15.2)
Prothrombin Time: 13.9 seconds (ref 11.6–15.2)
Prothrombin Time: 18.9 seconds — ABNORMAL HIGH (ref 11.6–15.2)

## 2010-10-14 LAB — POCT I-STAT, CHEM 8
BUN: 24 mg/dL — ABNORMAL HIGH (ref 6–23)
Calcium, Ion: 1.07 mmol/L — ABNORMAL LOW (ref 1.12–1.32)
Chloride: 106 mEq/L (ref 96–112)
Creatinine, Ser: 1 mg/dL (ref 0.4–1.2)
TCO2: 20 mmol/L (ref 0–100)

## 2010-10-14 LAB — CHOLESTEROL, TOTAL
Cholesterol: 72 mg/dL (ref 0–200)
Cholesterol: 85 mg/dL (ref 0–200)

## 2010-10-14 LAB — AMYLASE
Amylase: 177 U/L — ABNORMAL HIGH (ref 0–105)
Amylase: 203 U/L — ABNORMAL HIGH (ref 0–105)
Amylase: 227 U/L — ABNORMAL HIGH (ref 0–105)
Amylase: 126 U/L — ABNORMAL HIGH (ref 0–105)
Amylase: 135 U/L — ABNORMAL HIGH (ref 0–105)

## 2010-10-14 LAB — URINE MICROSCOPIC-ADD ON

## 2010-10-14 LAB — ANAEROBIC CULTURE: Gram Stain: NONE SEEN

## 2010-10-14 LAB — LIPID PANEL
Cholesterol: 120 mg/dL (ref 0–200)
HDL: 34 mg/dL — ABNORMAL LOW (ref >39)
LDL Cholesterol: 66 mg/dL (ref 0–99)
Total CHOL/HDL Ratio: 3.5 RATIO

## 2010-10-14 LAB — HEMOGLOBIN A1C: Hgb A1c MFr Bld: 6.1 % — ABNORMAL HIGH (ref <5.7)

## 2010-10-14 LAB — CARDIAC PANEL(CRET KIN+CKTOT+MB+TROPI)
CK, MB: 0.7 ng/mL (ref 0.3–4.0)
Relative Index: INVALID (ref 0.0–2.5)
Total CK: 40 U/L (ref 7–177)
Total CK: 46 U/L (ref 7–177)
Troponin I: 0.03 ng/mL (ref 0.00–0.06)

## 2010-10-14 LAB — PHOSPHORUS
Phosphorus: 3.3 mg/dL (ref 2.3–4.6)
Phosphorus: 3.4 mg/dL (ref 2.3–4.6)
Phosphorus: 3.4 mg/dL (ref 2.3–4.6)

## 2010-10-14 LAB — BODY FLUID CULTURE
Culture: NO GROWTH
Gram Stain: NONE SEEN

## 2010-10-14 LAB — LIPASE, BLOOD
Lipase: 109 U/L — ABNORMAL HIGH (ref 11–59)
Lipase: 116 U/L — ABNORMAL HIGH (ref 11–59)
Lipase: 65 U/L — ABNORMAL HIGH (ref 11–59)
Lipase: 90 U/L — ABNORMAL HIGH (ref 11–59)

## 2010-10-14 LAB — PREPARE CRYOPRECIPITATE

## 2010-10-14 LAB — ABO/RH: ABO/RH(D): A POS

## 2010-10-14 LAB — TRIGLYCERIDES
Triglycerides: 69 mg/dL (ref <150)
Triglycerides: 122 mg/dL (ref <150)

## 2010-10-14 LAB — MAGNESIUM
Magnesium: 1.8 mg/dL (ref 1.5–2.5)
Magnesium: 1.9 mg/dL (ref 1.5–2.5)
Magnesium: 1.9 mg/dL (ref 1.5–2.5)
Magnesium: 1.8 mg/dL (ref 1.5–2.5)
Magnesium: 1.8 mg/dL (ref 1.5–2.5)
Magnesium: 1.9 mg/dL (ref 1.5–2.5)

## 2010-10-14 LAB — PREALBUMIN
Prealbumin: 13.1 mg/dL — ABNORMAL LOW (ref 18.0–45.0)
Prealbumin: 13.5 mg/dL — ABNORMAL LOW (ref 18.0–45.0)
Prealbumin: 9.8 mg/dL — ABNORMAL LOW (ref 18.0–45.0)

## 2010-10-14 LAB — PLATELET INHIBITION P2Y12: P2Y12 % Inhibition: 19 %

## 2010-10-14 LAB — PLATELET COUNT: Platelets: 135 10*3/uL — ABNORMAL LOW (ref 150–400)

## 2010-10-14 LAB — BRAIN NATRIURETIC PEPTIDE: Pro B Natriuretic peptide (BNP): 486 pg/mL — ABNORMAL HIGH (ref 0.0–100.0)

## 2010-10-14 LAB — D-DIMER, QUANTITATIVE: D-Dimer, Quant: 3.96 ug/mL-FEU — ABNORMAL HIGH (ref 0.00–0.48)

## 2010-10-14 LAB — HEMOGLOBIN AND HEMATOCRIT, BLOOD: HCT: 27.1 % — ABNORMAL LOW (ref 36.0–46.0)

## 2010-10-14 LAB — CK TOTAL AND CKMB (NOT AT ARMC): Relative Index: INVALID (ref 0.0–2.5)

## 2010-10-14 LAB — APTT: aPTT: 35 seconds (ref 24–37)

## 2010-10-14 LAB — CULTURE, ROUTINE-ABSCESS

## 2010-10-14 LAB — CREATININE, SERUM: GFR calc Af Amer: >60 mL/min (ref >60)

## 2010-10-15 ENCOUNTER — Other Ambulatory Visit: Payer: Self-pay

## 2010-10-15 ENCOUNTER — Encounter (HOSPITAL_COMMUNITY): Payer: Medicare Other

## 2010-10-15 ENCOUNTER — Ambulatory Visit (HOSPITAL_COMMUNITY): Payer: Medicare Other

## 2010-10-15 LAB — GLUCOSE, CAPILLARY
Glucose-Capillary: 214 mg/dL — ABNORMAL HIGH (ref 70–99)
Glucose-Capillary: 160 mg/dL — ABNORMAL HIGH (ref 70–99)

## 2010-10-16 ENCOUNTER — Other Ambulatory Visit: Payer: Self-pay | Admitting: Surgery

## 2010-10-16 DIAGNOSIS — I712 Thoracic aortic aneurysm, without rupture: Secondary | ICD-10-CM

## 2010-10-16 LAB — GLUCOSE, CAPILLARY
Glucose-Capillary: 141 mg/dL — ABNORMAL HIGH (ref 70–99)
Glucose-Capillary: 151 mg/dL — ABNORMAL HIGH (ref 70–99)
Glucose-Capillary: 61 mg/dL — ABNORMAL LOW (ref 70–99)
Glucose-Capillary: 80 mg/dL (ref 70–99)

## 2010-10-17 ENCOUNTER — Ambulatory Visit (HOSPITAL_COMMUNITY): Payer: Medicare Other

## 2010-10-17 ENCOUNTER — Encounter (HOSPITAL_COMMUNITY): Payer: Medicare Other

## 2010-10-17 LAB — DIFFERENTIAL
Basophils Absolute: 0 10*3/uL (ref 0.0–0.1)
Basophils Relative: 0 % (ref 0–1)
Lymphocytes Relative: 22 % (ref 12–46)
Monocytes Absolute: 1.1 10*3/uL — ABNORMAL HIGH (ref 0.1–1.0)
Monocytes Relative: 10 % (ref 3–12)
Neutro Abs: 8 10*3/uL — ABNORMAL HIGH (ref 1.7–7.7)
Neutrophils Relative %: 67 % (ref 43–77)

## 2010-10-17 LAB — CBC
HCT: 36.1 % (ref 36.0–46.0)
HCT: 37.5 % (ref 36.0–46.0)
HCT: 40.5 % (ref 36.0–46.0)
Hemoglobin: 12.7 g/dL (ref 12.0–15.0)
Hemoglobin: 14.8 g/dL (ref 12.0–15.0)
MCH: 33 pg (ref 26.0–34.0)
MCHC: 34.7 g/dL (ref 30.0–36.0)
MCHC: 35.2 g/dL (ref 30.0–36.0)
MCHC: 36.5 g/dL — ABNORMAL HIGH (ref 30.0–36.0)
MCV: 93.8 fL (ref 78.0–100.0)
MCV: 94.5 fL (ref 78.0–100.0)
Platelets: 209 10*3/uL (ref 150–400)
Platelets: 214 10*3/uL (ref 150–400)
Platelets: 217 10*3/uL (ref 150–400)
RBC: 3.85 MIL/uL — ABNORMAL LOW (ref 3.87–5.11)
RDW: 11.8 % (ref 11.5–15.5)
RDW: 11.9 % (ref 11.5–15.5)
RDW: 12 % (ref 11.5–15.5)
RDW: 12.1 % (ref 11.5–15.5)
WBC: 11.8 10*3/uL — ABNORMAL HIGH (ref 4.0–10.5)
WBC: 11.9 10*3/uL — ABNORMAL HIGH (ref 4.0–10.5)
WBC: 7.2 10*3/uL (ref 4.0–10.5)
WBC: 9.7 10*3/uL (ref 4.0–10.5)

## 2010-10-17 LAB — BASIC METABOLIC PANEL WITH GFR
BUN: 20 mg/dL (ref 6–23)
BUN: 22 mg/dL (ref 6–23)
CO2: 23 meq/L (ref 19–32)
CO2: 26 meq/L (ref 19–32)
Calcium: 8.8 mg/dL (ref 8.4–10.5)
Calcium: 9.1 mg/dL (ref 8.4–10.5)
Chloride: 102 meq/L (ref 96–112)
Chloride: 103 meq/L (ref 96–112)
Creatinine, Ser: 0.8 mg/dL (ref 0.4–1.2)
Creatinine, Ser: 0.89 mg/dL (ref 0.4–1.2)
GFR calc non Af Amer: >60 mL/min
GFR calc non Af Amer: >60 mL/min
Glucose, Bld: 279 mg/dL — ABNORMAL HIGH (ref 70–99)
Glucose, Bld: 367 mg/dL — ABNORMAL HIGH (ref 70–99)
Potassium: 3.9 meq/L (ref 3.5–5.1)
Potassium: 4 meq/L (ref 3.5–5.1)
Sodium: 137 meq/L (ref 135–145)
Sodium: 138 meq/L (ref 135–145)

## 2010-10-17 LAB — CK TOTAL AND CKMB (NOT AT ARMC)
CK, MB: 11.8 ng/mL (ref 0.3–4.0)
CK, MB: 12.3 ng/mL (ref 0.3–4.0)
Relative Index: 7.7 — ABNORMAL HIGH (ref 0.0–2.5)
Total CK: 147 U/L (ref 7–177)

## 2010-10-17 LAB — CARDIAC PANEL(CRET KIN+CKTOT+MB+TROPI)
CK, MB: 2.2 ng/mL (ref 0.3–4.0)
CK, MB: 2.5 ng/mL (ref 0.3–4.0)
CK, MB: 6.5 ng/mL (ref 0.3–4.0)
CK, MB: 8.9 ng/mL (ref 0.3–4.0)
Relative Index: 2.1 (ref 0.0–2.5)
Relative Index: 6.1 — ABNORMAL HIGH (ref 0.0–2.5)
Relative Index: INVALID (ref 0.0–2.5)
Total CK: 105 U/L (ref 7–177)
Total CK: 124 U/L (ref 7–177)
Total CK: 146 U/L (ref 7–177)
Total CK: 93 U/L (ref 7–177)
Troponin I: 3.35 ng/mL (ref 0.00–0.06)
Troponin I: 3.95 ng/mL (ref 0.00–0.06)

## 2010-10-17 LAB — GLUCOSE, CAPILLARY
Glucose-Capillary: 171 mg/dL — ABNORMAL HIGH (ref 70–99)
Glucose-Capillary: 193 mg/dL — ABNORMAL HIGH (ref 70–99)
Glucose-Capillary: 307 mg/dL — ABNORMAL HIGH (ref 70–99)
Glucose-Capillary: 359 mg/dL — ABNORMAL HIGH (ref 70–99)
Glucose-Capillary: 406 mg/dL — ABNORMAL HIGH (ref 70–99)
Glucose-Capillary: 413 mg/dL — ABNORMAL HIGH (ref 70–99)

## 2010-10-17 LAB — COMPREHENSIVE METABOLIC PANEL
ALT: 22 U/L (ref 0–35)
Alkaline Phosphatase: 42 U/L (ref 39–117)
CO2: 27 mEq/L (ref 19–32)
Calcium: 8.8 mg/dL (ref 8.4–10.5)
GFR calc non Af Amer: >60 mL/min (ref >60)
Glucose, Bld: 151 mg/dL — ABNORMAL HIGH (ref 70–99)
Sodium: 140 mEq/L (ref 135–145)

## 2010-10-17 LAB — PROTIME-INR: INR: 1.04 (ref 0.00–1.49)

## 2010-10-17 LAB — BASIC METABOLIC PANEL
Calcium: 9.1 mg/dL (ref 8.4–10.5)
GFR calc Af Amer: >60 mL/min (ref >60)
GFR calc non Af Amer: >60 mL/min (ref >60)
Glucose, Bld: 213 mg/dL — ABNORMAL HIGH (ref 70–99)
Potassium: 3.7 mEq/L (ref 3.5–5.1)
Sodium: 135 mEq/L (ref 135–145)

## 2010-10-17 LAB — RENAL FUNCTION PANEL
BUN: 13 mg/dL (ref 6–23)
CO2: 25 mEq/L (ref 19–32)
Chloride: 104 mEq/L (ref 96–112)
Creatinine, Ser: 0.72 mg/dL (ref 0.4–1.2)
Glucose, Bld: 174 mg/dL — ABNORMAL HIGH (ref 70–99)
Potassium: 3.8 mEq/L (ref 3.5–5.1)

## 2010-10-17 LAB — LIPID PANEL
Cholesterol: 159 mg/dL (ref 0–200)
Cholesterol: 163 mg/dL (ref 0–200)
HDL: 30 mg/dL — ABNORMAL LOW (ref >39)
LDL Cholesterol: 92 mg/dL (ref 0–99)
Triglycerides: 179 mg/dL — ABNORMAL HIGH (ref <150)
VLDL: 36 mg/dL (ref 0–40)

## 2010-10-17 LAB — POCT CARDIAC MARKERS: Troponin i, poc: 1.14 ng/mL (ref 0.00–0.09)

## 2010-10-17 LAB — HEMOGLOBIN A1C: Hgb A1c MFr Bld: 7.3 % — ABNORMAL HIGH (ref <5.7)

## 2010-10-17 LAB — MRSA PCR SCREENING: MRSA by PCR: NEGATIVE

## 2010-10-17 LAB — HEPARIN LEVEL (UNFRACTIONATED)

## 2010-10-20 ENCOUNTER — Other Ambulatory Visit: Payer: Self-pay

## 2010-10-20 ENCOUNTER — Ambulatory Visit (HOSPITAL_COMMUNITY): Payer: Medicare Other

## 2010-10-20 ENCOUNTER — Encounter (HOSPITAL_COMMUNITY): Payer: Medicare Other

## 2010-10-20 LAB — GLUCOSE, CAPILLARY
Glucose-Capillary: 175 mg/dL — ABNORMAL HIGH (ref 70–99)
Glucose-Capillary: 138 mg/dL — ABNORMAL HIGH (ref 70–99)

## 2010-10-22 ENCOUNTER — Ambulatory Visit (HOSPITAL_COMMUNITY): Payer: Medicare Other

## 2010-10-22 ENCOUNTER — Encounter (HOSPITAL_COMMUNITY): Payer: Medicare Other

## 2010-10-24 ENCOUNTER — Ambulatory Visit (INDEPENDENT_AMBULATORY_CARE_PROVIDER_SITE_OTHER): Payer: Medicare Other | Admitting: Family Medicine

## 2010-10-24 ENCOUNTER — Encounter (HOSPITAL_COMMUNITY): Payer: Medicare Other

## 2010-10-24 ENCOUNTER — Ambulatory Visit (HOSPITAL_COMMUNITY): Payer: Medicare Other

## 2010-10-24 ENCOUNTER — Encounter: Payer: Self-pay | Admitting: Family Medicine

## 2010-10-24 DIAGNOSIS — E119 Type 2 diabetes mellitus without complications: Secondary | ICD-10-CM

## 2010-10-24 DIAGNOSIS — E785 Hyperlipidemia, unspecified: Secondary | ICD-10-CM

## 2010-10-24 DIAGNOSIS — M791 Myalgia, unspecified site: Secondary | ICD-10-CM

## 2010-10-24 DIAGNOSIS — IMO0001 Reserved for inherently not codable concepts without codable children: Secondary | ICD-10-CM

## 2010-10-24 DIAGNOSIS — I10 Essential (primary) hypertension: Secondary | ICD-10-CM

## 2010-10-24 MED ORDER — POLYSACCHARIDE IRON COMPLEX 150 MG PO CAPS: 150.0000 mg | ORAL_CAPSULE | Freq: Every day | ORAL | Status: DC

## 2010-10-24 MED ORDER — ESCITALOPRAM OXALATE 5 MG PO TABS: 5.0000 mg | ORAL_TABLET | Freq: Every day | ORAL | Status: DC

## 2010-10-24 MED ORDER — METFORMIN HCL 1000 MG PO TABS: ORAL_TABLET | ORAL | Status: DC

## 2010-10-24 MED ORDER — ROSUVASTATIN CALCIUM 10 MG PO TABS: 10.0000 mg | ORAL_TABLET | Freq: Every day | ORAL | Status: DC

## 2010-10-24 MED ORDER — CARVEDILOL 12.5 MG PO TABS: 12.5000 mg | ORAL_TABLET | Freq: Two times a day (BID) | ORAL | Status: DC

## 2010-10-24 MED ORDER — LEVOTHYROXINE SODIUM 112 MCG PO TABS: ORAL_TABLET | ORAL | Status: DC

## 2010-10-24 MED ORDER — VALSARTAN 160 MG PO TABS: 160.0000 mg | ORAL_TABLET | Freq: Every day | ORAL | Status: DC

## 2010-10-24 MED ORDER — AMLODIPINE BESYLATE 5 MG PO TABS: 5.0000 mg | ORAL_TABLET | Freq: Every day | ORAL | Status: DC

## 2010-10-24 MED ORDER — FUROSEMIDE 40 MG PO TABS: 40.0000 mg | ORAL_TABLET | Freq: Every day | ORAL | Status: DC

## 2010-10-24 MED ORDER — ESOMEPRAZOLE MAGNESIUM 20 MG PO CPDR: 20.0000 mg | DELAYED_RELEASE_CAPSULE | Freq: Every day | ORAL | Status: DC

## 2010-10-24 MED ORDER — ALPRAZOLAM 0.5 MG PO TABS: 0.5000 mg | ORAL_TABLET | Freq: Every evening | ORAL | Status: DC | PRN

## 2010-10-24 MED ORDER — POTASSIUM CHLORIDE CRYS ER 20 MEQ PO TBCR: 20.0000 meq | EXTENDED_RELEASE_TABLET | Freq: Every day | ORAL | Status: DC

## 2010-10-24 NOTE — Patient Instructions (Signed)
Increase metformin to 1000 mg in morning and 500 mg at night. Consider reducing Crestor to one three times weekly for the next month to see if muscle aches decrease.

## 2010-10-26 ENCOUNTER — Encounter: Payer: Self-pay | Admitting: Family Medicine

## 2010-10-26 DIAGNOSIS — M791 Myalgia, unspecified site: Secondary | ICD-10-CM | POA: Insufficient documentation

## 2010-10-26 NOTE — Progress Notes (Signed)
  Subjective:    Patient ID: Lindsay Nguyen, female    DOB: November 03, 1935, 75 y.o.   MRN: [id number]  HPI Patient here for medial follow up.  Multiple recent admissions and she continues to recover.  Type 2 diabetes.  Recent A1C up at 7.3%.  Recent fasting glucose up around 160-180 range.  No symptoms of hyperglycemia.  She is currently on metformin 500 mg po bid.  Hypertension poorly controlled.  Added amlodipine 5 mg last visit. No headaches or edema.  BP is improved some by home readings.  No orthostasis.  Compliant with all meds.  Recent myalgias, esp legs.  ?since starting Crestor.  No arthralgia.  Fatigue is actually improving somewhat.  No headaches.  No known history of impaired intolerance to statins.   Review of Systems  Constitutional: Positive for fatigue. Negative for fever and chills.  Respiratory: Negative for cough and wheezing.   Cardiovascular: Negative for chest pain, palpitations and leg swelling.  Gastrointestinal: Negative for nausea, vomiting, abdominal pain and abdominal distention.  Genitourinary: Negative for dysuria.  Musculoskeletal: Negative for gait problem.  Neurological: Negative for dizziness, syncope and headaches.  Psychiatric/Behavioral: Negative for confusion and dysphoric mood.       Objective:   Physical Exam  Constitutional: She is oriented to person, place, and time. She appears well-developed and well-nourished. No distress.  HENT:  Head: Normocephalic and atraumatic.  Right Ear: External ear normal.  Left Ear: External ear normal.  Mouth/Throat: No oropharyngeal exudate.  Eyes: EOM are normal. Pupils are equal, round, and reactive to light. Left eye exhibits no discharge.  Neck: Normal range of motion. Neck supple. No thyromegaly present.  Cardiovascular: Normal rate, regular rhythm and normal heart sounds.  Exam reveals no gallop.   Pulmonary/Chest: Effort normal and breath sounds normal. She has no wheezes. She has no rales.    Musculoskeletal: She exhibits no edema.  Lymphadenopathy:    She has no cervical adenopathy.  Neurological: She is alert and oriented to person, place, and time. No cranial nerve deficit.  Skin: No rash noted.  Psychiatric: She has a normal mood and affect.          Assessment & Plan:  #1  Type 2 diabetes.  Poor control.  Increase metformin to 1000 mg AM and 500 mg pm and A1C in 3 months. #2  Hypertension improved. #3  Anemia improved by recent labs. #4  Myalgias-?statin related. She will consider reducing her Crestor to 3 times weekly to see if any impact on myalgias and be in touch if none.

## 2010-10-27 ENCOUNTER — Encounter (HOSPITAL_COMMUNITY): Payer: Medicare Other

## 2010-10-27 ENCOUNTER — Other Ambulatory Visit: Payer: Self-pay | Admitting: Cardiovascular Disease

## 2010-10-27 ENCOUNTER — Other Ambulatory Visit: Payer: Self-pay | Admitting: *Deleted

## 2010-10-27 ENCOUNTER — Ambulatory Visit (HOSPITAL_COMMUNITY): Payer: Medicare Other

## 2010-10-27 NOTE — Telephone Encounter (Signed)
Pt requested all her meds be printed, signed and she will send to [facility].  12 med Rx given to pt per OV last week with Dr [name] Never

## 2010-10-29 ENCOUNTER — Other Ambulatory Visit: Payer: Self-pay | Admitting: Cardiovascular Disease

## 2010-10-29 ENCOUNTER — Encounter (HOSPITAL_COMMUNITY): Payer: Medicare Other

## 2010-10-29 ENCOUNTER — Ambulatory Visit (HOSPITAL_COMMUNITY): Payer: Medicare Other

## 2010-10-29 LAB — GLUCOSE, CAPILLARY: Glucose-Capillary: 129 mg/dL — ABNORMAL HIGH (ref 70–99)

## 2010-10-31 ENCOUNTER — Other Ambulatory Visit: Payer: Self-pay | Admitting: Cardiovascular Disease

## 2010-10-31 ENCOUNTER — Ambulatory Visit (HOSPITAL_COMMUNITY): Payer: Medicare Other

## 2010-10-31 ENCOUNTER — Encounter (HOSPITAL_COMMUNITY): Payer: Medicare Other

## 2010-11-03 ENCOUNTER — Other Ambulatory Visit: Payer: Self-pay | Admitting: Cardiovascular Disease

## 2010-11-03 ENCOUNTER — Encounter (HOSPITAL_COMMUNITY): Payer: Medicare Other | Attending: Cardiovascular Disease

## 2010-11-03 ENCOUNTER — Ambulatory Visit (HOSPITAL_COMMUNITY): Payer: Medicare Other

## 2010-11-03 DIAGNOSIS — I712 Thoracic aortic aneurysm, without rupture, unspecified: Secondary | ICD-10-CM | POA: Insufficient documentation

## 2010-11-03 DIAGNOSIS — I2510 Atherosclerotic heart disease of native coronary artery without angina pectoris: Secondary | ICD-10-CM | POA: Insufficient documentation

## 2010-11-03 DIAGNOSIS — Z951 Presence of aortocoronary bypass graft: Secondary | ICD-10-CM | POA: Insufficient documentation

## 2010-11-03 DIAGNOSIS — E039 Hypothyroidism, unspecified: Secondary | ICD-10-CM | POA: Insufficient documentation

## 2010-11-03 DIAGNOSIS — E119 Type 2 diabetes mellitus without complications: Secondary | ICD-10-CM | POA: Insufficient documentation

## 2010-11-03 DIAGNOSIS — I252 Old myocardial infarction: Secondary | ICD-10-CM | POA: Insufficient documentation

## 2010-11-03 DIAGNOSIS — Z91013 Allergy to seafood: Secondary | ICD-10-CM | POA: Insufficient documentation

## 2010-11-03 DIAGNOSIS — I10 Essential (primary) hypertension: Secondary | ICD-10-CM | POA: Insufficient documentation

## 2010-11-03 DIAGNOSIS — Z5189 Encounter for other specified aftercare: Secondary | ICD-10-CM | POA: Insufficient documentation

## 2010-11-03 DIAGNOSIS — Z7902 Long term (current) use of antithrombotics/antiplatelets: Secondary | ICD-10-CM | POA: Insufficient documentation

## 2010-11-03 DIAGNOSIS — E785 Hyperlipidemia, unspecified: Secondary | ICD-10-CM | POA: Insufficient documentation

## 2010-11-03 DIAGNOSIS — I4891 Unspecified atrial fibrillation: Secondary | ICD-10-CM | POA: Insufficient documentation

## 2010-11-03 DIAGNOSIS — Z9861 Coronary angioplasty status: Secondary | ICD-10-CM | POA: Insufficient documentation

## 2010-11-05 ENCOUNTER — Ambulatory Visit (HOSPITAL_COMMUNITY): Payer: Medicare Other

## 2010-11-05 ENCOUNTER — Other Ambulatory Visit: Payer: Self-pay | Admitting: Cardiovascular Disease

## 2010-11-05 ENCOUNTER — Encounter (HOSPITAL_COMMUNITY): Payer: Medicare Other

## 2010-11-07 ENCOUNTER — Encounter (HOSPITAL_COMMUNITY): Payer: Medicare Other

## 2010-11-07 ENCOUNTER — Ambulatory Visit (HOSPITAL_COMMUNITY): Payer: Medicare Other

## 2010-11-10 ENCOUNTER — Ambulatory Visit (HOSPITAL_COMMUNITY): Payer: Medicare Other

## 2010-11-10 ENCOUNTER — Encounter (HOSPITAL_COMMUNITY): Payer: Medicare Other

## 2010-11-12 ENCOUNTER — Ambulatory Visit (HOSPITAL_COMMUNITY): Payer: Medicare Other

## 2010-11-12 ENCOUNTER — Encounter (HOSPITAL_COMMUNITY): Payer: Medicare Other

## 2010-11-12 ENCOUNTER — Telehealth: Payer: Self-pay | Admitting: *Deleted

## 2010-11-12 NOTE — Telephone Encounter (Signed)
Fax received from pt [facility] [place], "Pt sent off an Rx for Alprazolam to [facility].  They [facility] they cannot fill Xanax so we need RX refills so we can order for pt.  I called pt to see if [facility] returned the original Rx, she was told they could not return the Rx because other prescriptions were on the same sheet.    Requesting permission to send new Rx to local pharmacy.

## 2010-11-12 NOTE — Telephone Encounter (Signed)
OK to fill at local pharmacy.

## 2010-11-13 MED ORDER — ALPRAZOLAM 0.5 MG PO TABS: 0.5000 mg | ORAL_TABLET | Freq: Every evening | ORAL | Status: DC | PRN

## 2010-11-13 NOTE — Telephone Encounter (Signed)
Rx called in 

## 2010-11-14 ENCOUNTER — Ambulatory Visit (HOSPITAL_COMMUNITY): Payer: Medicare Other

## 2010-11-14 ENCOUNTER — Encounter (HOSPITAL_COMMUNITY): Payer: Medicare Other

## 2010-11-16 ENCOUNTER — Other Ambulatory Visit: Payer: Medicare Other

## 2010-11-17 ENCOUNTER — Encounter (HOSPITAL_COMMUNITY): Payer: Medicare Other

## 2010-11-17 ENCOUNTER — Ambulatory Visit (HOSPITAL_COMMUNITY): Payer: Medicare Other

## 2010-11-17 ENCOUNTER — Encounter: Payer: Self-pay | Admitting: Family Medicine

## 2010-11-18 ENCOUNTER — Other Ambulatory Visit: Payer: Medicare Other

## 2010-11-18 ENCOUNTER — Encounter: Payer: Medicare Other | Admitting: Surgery

## 2010-11-19 ENCOUNTER — Ambulatory Visit (HOSPITAL_COMMUNITY): Payer: Medicare Other

## 2010-11-19 ENCOUNTER — Encounter (HOSPITAL_COMMUNITY): Payer: Medicare Other

## 2010-11-21 ENCOUNTER — Encounter (HOSPITAL_COMMUNITY): Payer: Medicare Other

## 2010-11-21 ENCOUNTER — Ambulatory Visit (HOSPITAL_COMMUNITY): Payer: Medicare Other

## 2010-11-21 ENCOUNTER — Encounter: Payer: Self-pay | Admitting: Family Medicine

## 2010-11-24 ENCOUNTER — Encounter (HOSPITAL_COMMUNITY): Payer: Medicare Other

## 2010-11-24 ENCOUNTER — Ambulatory Visit (HOSPITAL_COMMUNITY): Payer: Medicare Other

## 2010-11-26 ENCOUNTER — Encounter (HOSPITAL_COMMUNITY): Payer: Medicare Other

## 2010-11-26 ENCOUNTER — Ambulatory Visit (HOSPITAL_COMMUNITY): Payer: Medicare Other

## 2010-11-28 ENCOUNTER — Encounter (HOSPITAL_COMMUNITY): Payer: Medicare Other

## 2010-11-28 ENCOUNTER — Ambulatory Visit (HOSPITAL_COMMUNITY): Payer: Medicare Other

## 2010-12-01 ENCOUNTER — Ambulatory Visit (HOSPITAL_COMMUNITY): Payer: Medicare Other

## 2010-12-01 ENCOUNTER — Encounter (HOSPITAL_COMMUNITY): Payer: Medicare Other

## 2010-12-03 ENCOUNTER — Ambulatory Visit (HOSPITAL_COMMUNITY): Payer: Medicare Other

## 2010-12-03 ENCOUNTER — Encounter (HOSPITAL_COMMUNITY): Payer: Medicare Other | Attending: Cardiovascular Disease

## 2010-12-03 DIAGNOSIS — I4891 Unspecified atrial fibrillation: Secondary | ICD-10-CM | POA: Insufficient documentation

## 2010-12-03 DIAGNOSIS — Z91013 Allergy to seafood: Secondary | ICD-10-CM | POA: Insufficient documentation

## 2010-12-03 DIAGNOSIS — I712 Thoracic aortic aneurysm, without rupture, unspecified: Secondary | ICD-10-CM | POA: Insufficient documentation

## 2010-12-03 DIAGNOSIS — E039 Hypothyroidism, unspecified: Secondary | ICD-10-CM | POA: Insufficient documentation

## 2010-12-03 DIAGNOSIS — Z5189 Encounter for other specified aftercare: Secondary | ICD-10-CM | POA: Insufficient documentation

## 2010-12-03 DIAGNOSIS — I2510 Atherosclerotic heart disease of native coronary artery without angina pectoris: Secondary | ICD-10-CM | POA: Insufficient documentation

## 2010-12-03 DIAGNOSIS — Z951 Presence of aortocoronary bypass graft: Secondary | ICD-10-CM | POA: Insufficient documentation

## 2010-12-03 DIAGNOSIS — E119 Type 2 diabetes mellitus without complications: Secondary | ICD-10-CM | POA: Insufficient documentation

## 2010-12-03 DIAGNOSIS — E785 Hyperlipidemia, unspecified: Secondary | ICD-10-CM | POA: Insufficient documentation

## 2010-12-03 DIAGNOSIS — I10 Essential (primary) hypertension: Secondary | ICD-10-CM | POA: Insufficient documentation

## 2010-12-03 DIAGNOSIS — I252 Old myocardial infarction: Secondary | ICD-10-CM | POA: Insufficient documentation

## 2010-12-03 DIAGNOSIS — Z9861 Coronary angioplasty status: Secondary | ICD-10-CM | POA: Insufficient documentation

## 2010-12-03 DIAGNOSIS — Z7902 Long term (current) use of antithrombotics/antiplatelets: Secondary | ICD-10-CM | POA: Insufficient documentation

## 2010-12-04 ENCOUNTER — Encounter: Payer: Self-pay | Admitting: Family Medicine

## 2010-12-05 ENCOUNTER — Ambulatory Visit (HOSPITAL_COMMUNITY): Payer: Medicare Other

## 2010-12-05 ENCOUNTER — Encounter (HOSPITAL_COMMUNITY): Payer: Medicare Other

## 2010-12-08 ENCOUNTER — Ambulatory Visit (HOSPITAL_COMMUNITY): Payer: Medicare Other

## 2010-12-08 ENCOUNTER — Encounter (HOSPITAL_COMMUNITY): Payer: Medicare Other

## 2010-12-09 ENCOUNTER — Telehealth: Payer: Self-pay | Admitting: Family Medicine

## 2010-12-09 LAB — HM MAMMOGRAPHY

## 2010-12-09 NOTE — Telephone Encounter (Signed)
Pt needs order sent to breast center [address] for screening mammogram and bone density test dx osteopenia

## 2010-12-09 NOTE — Telephone Encounter (Signed)
Order signed for DEXA 

## 2010-12-10 ENCOUNTER — Encounter: Payer: Self-pay | Admitting: Family Medicine

## 2010-12-10 ENCOUNTER — Ambulatory Visit (HOSPITAL_COMMUNITY): Payer: Medicare Other

## 2010-12-10 ENCOUNTER — Encounter (HOSPITAL_COMMUNITY): Payer: Medicare Other

## 2010-12-11 ENCOUNTER — Encounter: Payer: Self-pay | Admitting: Family Medicine

## 2010-12-12 ENCOUNTER — Ambulatory Visit (HOSPITAL_COMMUNITY): Payer: Medicare Other

## 2010-12-12 ENCOUNTER — Encounter (HOSPITAL_COMMUNITY): Payer: Medicare Other

## 2010-12-15 ENCOUNTER — Encounter (HOSPITAL_COMMUNITY): Payer: Medicare Other

## 2010-12-15 ENCOUNTER — Ambulatory Visit (HOSPITAL_COMMUNITY): Payer: Medicare Other

## 2010-12-15 ENCOUNTER — Other Ambulatory Visit: Payer: Self-pay | Admitting: Family Medicine

## 2010-12-16 NOTE — Consult Note (Signed)
NEW PATIENT CONSULTATION   Lindsay Nguyen  DOB:  Feb 03, 1936                                        May 20, 2010  CHART #:  [id number]   REASON FOR CONSULTATION:  A 4.9-cm ascending aortic aneurysm.   CLINICAL HISTORY:  I was asked by Dr. Allyson Nguyen to evaluate Lindsay Nguyen for an  ascending aortic aneurysm.  She is a 75 year old woman with a history of  adult-onset diabetes, hypertension, and dyslipidemia who presented with  an anterolateral myocardial infarction on March 21, 2010.  Catheterization showed a 95% proximal LAD stenosis after a small first  diagonal branch.  She had diffuse disease in her mid and distal LAD with  about 50% stenosis.  There is also about 80% stenosis in the AV groove  portion of the left circumflex and no significant right coronary  disease.  Ejection fraction was 45% with anterior apical akinesis.  Her  aortic root was dilated.  She had a bare-metal stent placed in the  proximal LAD, decreasing the stenosis there to 0%.  She had an  uncomplicated postprocedure course.  She was discharged on Plavix.  She  subsequently underwent a CT angiogram of the chest, which showed a  fusiform ascending aortic aneurysm beginning at the level of the aortic  root with a maximum diameter of 4.9 centimeters.  His taper back down to  normal caliber at the level of the proximal aortic arch.  The descending  thoracic aorta appeared to be of normal caliber.  Her abdominal aorta  and renal arteries were noted to be normal at the time of  catheterization.   REVIEW OF SYSTEMS:  GENERAL:  She denies any fever or chills.  She has  had no recent weight changes.  EYES:  Negative.  ENT:  Negative.  ENDOCRINE:  She denies hypothyroidism.  She does have adult-onset  diabetes.  CARDIAC:  She denies any chest pain or pressure.  She denies having any  dyspnea.  She has had no PND or orthopnea.  She does have a history of  atrial fibrillation in the past.  RESPIRATORY:  She denies sputum production.  She has had nonproductive  cough.  GI:  She did have some nausea, vomiting and abdominal pain for a couple  days prior to presenting with her myocardial infarction and believes  that this may be an anginal equivalent.  She has had no new symptoms  since her percutaneous intervention.  She denies diarrhea and  constipation.  GU:  She denies dysuria and hematuria.  MUSCULOSKELETAL:  She denies arthralgias and myalgias.  VASCULAR:  She has had no claudication or phlebitis.  NEUROLOGIC:  She has had some headaches.  She denies dizziness and  syncope.  She has never had TIA or stroke.  PSYCHIATRIC:  Negative.  HEMATOLOGIC:  Negative.   ALLERGIES:  Iodine and Neurontin.   MEDICATIONS:  Lipitor 10 mg daily, TriCor 145 mg daily, Coreg 12.5 mg  b.i.d., Cardizem 180 mg daily, Plavix 75 mg daily, sublingual  nitroglycerin Nguyen.r.n., hydrochlorothiazide 25 mg daily, Diovan 320 mg  daily, Synthroid 112 mcg one-half daily, aspirin 81 mg 2 daily,  metformin 1000 mg b.i.d., glimepiride 2 mg daily, Zoloft 50 mg daily,  vitamin C 5 mg daily, vitamin D3 2000 units daily, Coenzyme Q10 100 100  mg daily, garlic 1000 mg  daily, fish oil 1000 mg b.i.d., multivitamin  daily, potassium chloride 10 mEq daily.   PAST MEDICAL HISTORY:  Significant for type 2 diabetes, dyslipidemia,  hypertension.  She has remote history of atrial fibrillation.  She has a  history of coronary artery disease status post non-ST-segment elevation  MI and stenting of her LAD.  She has a history of thyroid goiter.  She  has a history of Bell palsy.  She has a history of osteopenia.   FAMILY HISTORY:  Positive for cardiac disease.  Her father had coronary  artery disease and had 2 bypass surgeries.  Her brother died around 50  years old with coronary artery disease.  There is no history of aneurysm  disease or Marfan disease.   SOCIAL HISTORY:  She is married to her second husband who has  Alzheimer  disease.  She has 2 adult children who were here with her today, both  are nurses.  She is a retired Engineer, civil (consulting).  She has never smoked.  Denies  alcohol use.   PHYSICAL EXAMINATION:  Vital Signs:  Blood pressure is 128/68, pulse is  62 and regular, respiratory rate is 18 unlabored.  Oxygen saturation on  room air is 91%.  General:  She is a well-developed white female in no  distress.  HEENT:  Normocephalic and atraumatic.  Pupils are equal and  reactive to light and accommodation.  Extraocular muscles are intact.  Oropharynx is clear.  Neck:  Normal carotid pulses bilaterally.  There  are no bruits.  There is no adenopathy.  There is palpable thyroid  goiter more prominent on the right side.  Cardiac:  Regular rate and  rhythm with a grade 1/6 systolic murmur over aorta.  There is no  diastolic murmur.  Lungs:  Clear.  Abdomen:  Active bowel sounds.  Abdomen is soft, mildly obese and nontender.  There are no palpable  masses or organomegaly.  Extremities:  No peripheral edema.  Pedal  pulses are palpable bilaterally.  Skin is warm and dry.  Neurologic:  Alert and oriented x3.  Motor and sensory exams grossly normal.   IMPRESSION:  The patient has a 4.9-cm fusiform ascending aortic aneurysm  beginning at the level of aortic root extending up to the proximal arch.  It is unclear how long this has been present.  She has undergone recent  stenting of the proximal LAD for high-grade stenosis after a non-ST-  segment elevation MI.  I recommended that we follow up her aneurysm with  a MR angiogram in 6 months to reassess the size.  Given her significant  allergy to iodinated contrast, I think an MRI would be the best [place] to  follow this long-term.  I discuss the treatment of aneurysmal disease  with her and her children and told them that I would not recommend  surgical treatment unless, we saw progressive enlargement to 5.5 cm.  I  told them that her risk of rupture or dissection is  very low at the  current size, but is not 0, and I have recommended tight blood pressure  control, trying to maintain a blood pressure of 120/70 or less with use  of a beta-blocker.  I also told her that I thought it was fine for her  to resume cardiac rehab and there was no contraindication to exercise as  long as she is not performing any heavy lifting.  She is anxious to  return to physical activity.  Her children have said that  they may be  interested in having her treated at another institution.  I told them  that I would be happy to follow her here if they would like, and would  be happy to refer her somewhere else if they desire.  All of their  questions have been answered.  Dr. Allyson Nguyen is apparently planning on doing  an echocardiogram in November to assess her aortic valve.  She does have  slight murmur and certainly could have a bicuspid aortic valve.  I will  plan to see her back in 6 months and we will review the MR angiogram  with them at that time.   Evelene Croon, M.D.  Electronically Signed   BB/MEDQ  D:  05/20/2010  T:  05/21/2010  Job:  [id number]

## 2010-12-17 ENCOUNTER — Encounter (HOSPITAL_COMMUNITY): Payer: Medicare Other

## 2010-12-17 ENCOUNTER — Ambulatory Visit (HOSPITAL_COMMUNITY): Payer: Medicare Other

## 2010-12-19 ENCOUNTER — Encounter (HOSPITAL_COMMUNITY): Payer: Medicare Other

## 2010-12-19 ENCOUNTER — Telehealth: Payer: Self-pay | Admitting: *Deleted

## 2010-12-19 ENCOUNTER — Ambulatory Visit (HOSPITAL_COMMUNITY): Payer: Medicare Other

## 2010-12-19 NOTE — Op Note (Signed)
   NAME:  Lindsay Nguyen, Lindsay Nguyen                          ACCOUNT NO.:  [id number]   MEDICAL RECORD NO.:  [id number]                   PATIENT TYPE:  AMB   LOCATION:  ENDO                                 FACILITY:  [facility]   PHYSICIAN:  Bernette Redbird, M.D.                DATE OF BIRTH:  1936/04/27   DATE OF PROCEDURE:  02/23/2003  DATE OF DISCHARGE:                                 OPERATIVE REPORT   PROCEDURE:  Colonoscopy.   INDICATIONS:  Screening for colon cancer in a 75 year old female.   FINDINGS:  Diminutive polyp removed.   PROCEDURE:  The nature, purpose, and risks of the procedure had been  discussed with the patient, who provided written consent.  Sedation was  fentanyl 40 mcg and Versed 4 mg IV without arrhythmias or desaturation.   The Olympus adult video colonoscope was advanced easily to the terminal  ileum, which had a normal appearance, and pull-back was then performed.   A short distance from the cecum was a 2 mm erythematous sessile polyp  removed by cold biopsy technique.  No other polyps were seen during this  exam.  The quality of the prep was excellent, and it was felt that all areas  were well-seen.  There was no evidence of cancer, colitis, vascular  malformations, or diverticulosis, and retroflexion in the rectum as well as  reinspection in the rectosigmoid was unremarkable.  The patient tolerated  the procedure well, and there were no apparent complications.   IMPRESSION:  Solitary diminutive polyp in the proximal colon, otherwise  normal exam.   PLAN:  Await pathology results.                                               Bernette Redbird, M.D.    RB/MEDQ  D:  02/23/2003  T:  02/23/2003  Job:  [id number]   cc:   Evelena Peat, M.D.  [address]  [place]  [place] [address]  Fax: [phone]

## 2010-12-19 NOTE — Telephone Encounter (Signed)
Pt informed we have report mild osteopenia on VM

## 2010-12-19 NOTE — Op Note (Signed)
   NAME:  Lindsay Nguyen, Lindsay Nguyen                          ACCOUNT NO.:  [id number]   MEDICAL RECORD NO.:  [id number]                   PATIENT TYPE:  AMB   LOCATION:  ENDO                                 FACILITY:  [facility]   PHYSICIAN:  Bernette Redbird, M.D.                DATE OF BIRTH:  02/22/1936   DATE OF PROCEDURE:  02/23/2003  DATE OF DISCHARGE:                                 OPERATIVE REPORT   PROCEDURE:  Upper endoscopy.   INDICATIONS FOR PROCEDURE:  Hoarseness in a 75 year old female whose ENT  evaluation showed possible reflux laryngitis.   FINDINGS:  No overt markers of gastroesophageal reflux disease.   DESCRIPTION OF PROCEDURE:  The nature, purpose and risks of the procedure  have been reviewed with the patient and he provided written consent.  Sedation was Fentanyl 60 micrograms and Versed 6 mg IV without arrhythmias  or desaturation.   The Olympus video endoscope was passed under direct vision. The vocal cords  at this time looked virtually normal. There were perhaps some subtle  manifestations of inflammation such as some mild thickening of the posterior  commissure, but nothing really impressive.   The esophagus was readily entered and had normal mucosa without evidence of  reflux esophagitis, Barrett's esophagus, varices, infection or neoplasia.  There was a minimal 1 to 2-cm hiatal hernia which seemed to reduce  spontaneously during the course of the procedure as well as a very minimal  esophageal mucosal ring at the squamocolumnar junction.   The stomach  was unremarkable without any evidence of excessive residual nor  any evidence of gastritis, erosions, ulcers, polyps or masses, including a  retroflex view of the proximal stomach  which did appear to show a small  hiatal hernia from the inferior perspective with a diaphragmatic hiatus  measuring about 2 cm. The pylorus, duodenal bulb and 2nd duodenum looked  normal.   The scope was then removed from the patient. He  tolerated  the procedure  well without apparent complications.   IMPRESSION:  Essentially normal examination. Minimal hiatal hernia with  Schatzki's ring.    PLAN:  For now continue Nexium to see how it works for her hoarseness  symptoms. Proceed to screening colonoscopy.                                               Bernette Redbird, M.D.    RB/MEDQ  D:  02/23/2003  T:  02/23/2003  Job:  [id number]   cc:   Evelena Peat, M.D.  [address]  [place]  [place] [address]  Fax: [phone]

## 2010-12-22 ENCOUNTER — Ambulatory Visit (HOSPITAL_COMMUNITY): Payer: Medicare Other

## 2010-12-22 ENCOUNTER — Encounter (HOSPITAL_COMMUNITY): Payer: Medicare Other

## 2010-12-22 ENCOUNTER — Encounter: Payer: Self-pay | Admitting: Family Medicine

## 2010-12-24 ENCOUNTER — Ambulatory Visit (HOSPITAL_COMMUNITY): Payer: Medicare Other

## 2010-12-24 ENCOUNTER — Encounter (HOSPITAL_COMMUNITY): Payer: Medicare Other

## 2010-12-25 ENCOUNTER — Encounter: Payer: Self-pay | Admitting: Family Medicine

## 2010-12-26 ENCOUNTER — Ambulatory Visit (HOSPITAL_COMMUNITY): Payer: Medicare Other

## 2010-12-26 ENCOUNTER — Encounter (HOSPITAL_COMMUNITY): Payer: Medicare Other

## 2010-12-29 ENCOUNTER — Encounter (HOSPITAL_COMMUNITY): Payer: Medicare Other

## 2010-12-29 ENCOUNTER — Ambulatory Visit (HOSPITAL_COMMUNITY): Payer: Medicare Other

## 2010-12-31 ENCOUNTER — Ambulatory Visit (HOSPITAL_COMMUNITY): Payer: Medicare Other

## 2010-12-31 ENCOUNTER — Encounter (HOSPITAL_COMMUNITY): Payer: Medicare Other

## 2011-01-02 ENCOUNTER — Encounter (HOSPITAL_COMMUNITY): Payer: Medicare Other | Attending: Cardiovascular Disease

## 2011-01-02 ENCOUNTER — Ambulatory Visit (HOSPITAL_COMMUNITY): Payer: Medicare Other

## 2011-01-02 DIAGNOSIS — I2510 Atherosclerotic heart disease of native coronary artery without angina pectoris: Secondary | ICD-10-CM | POA: Insufficient documentation

## 2011-01-02 DIAGNOSIS — Z5189 Encounter for other specified aftercare: Secondary | ICD-10-CM | POA: Insufficient documentation

## 2011-01-02 DIAGNOSIS — E039 Hypothyroidism, unspecified: Secondary | ICD-10-CM | POA: Insufficient documentation

## 2011-01-02 DIAGNOSIS — Z7902 Long term (current) use of antithrombotics/antiplatelets: Secondary | ICD-10-CM | POA: Insufficient documentation

## 2011-01-02 DIAGNOSIS — I712 Thoracic aortic aneurysm, without rupture, unspecified: Secondary | ICD-10-CM | POA: Insufficient documentation

## 2011-01-02 DIAGNOSIS — I10 Essential (primary) hypertension: Secondary | ICD-10-CM | POA: Insufficient documentation

## 2011-01-02 DIAGNOSIS — E119 Type 2 diabetes mellitus without complications: Secondary | ICD-10-CM | POA: Insufficient documentation

## 2011-01-02 DIAGNOSIS — E785 Hyperlipidemia, unspecified: Secondary | ICD-10-CM | POA: Insufficient documentation

## 2011-01-02 DIAGNOSIS — I252 Old myocardial infarction: Secondary | ICD-10-CM | POA: Insufficient documentation

## 2011-01-02 DIAGNOSIS — Z9861 Coronary angioplasty status: Secondary | ICD-10-CM | POA: Insufficient documentation

## 2011-01-02 DIAGNOSIS — Z951 Presence of aortocoronary bypass graft: Secondary | ICD-10-CM | POA: Insufficient documentation

## 2011-01-02 DIAGNOSIS — Z91013 Allergy to seafood: Secondary | ICD-10-CM | POA: Insufficient documentation

## 2011-01-02 DIAGNOSIS — I4891 Unspecified atrial fibrillation: Secondary | ICD-10-CM | POA: Insufficient documentation

## 2011-01-05 ENCOUNTER — Ambulatory Visit (HOSPITAL_COMMUNITY): Payer: Medicare Other

## 2011-01-05 ENCOUNTER — Encounter (HOSPITAL_COMMUNITY): Payer: Medicare Other

## 2011-01-07 ENCOUNTER — Ambulatory Visit (HOSPITAL_COMMUNITY): Payer: Medicare Other

## 2011-01-07 ENCOUNTER — Encounter (HOSPITAL_COMMUNITY): Payer: Medicare Other

## 2011-01-09 ENCOUNTER — Encounter (HOSPITAL_COMMUNITY): Payer: Medicare Other

## 2011-01-09 ENCOUNTER — Ambulatory Visit (HOSPITAL_COMMUNITY): Payer: Medicare Other

## 2011-01-12 ENCOUNTER — Ambulatory Visit (HOSPITAL_COMMUNITY): Payer: Medicare Other

## 2011-01-14 ENCOUNTER — Ambulatory Visit (HOSPITAL_COMMUNITY): Payer: Medicare Other

## 2011-01-16 ENCOUNTER — Ambulatory Visit (HOSPITAL_COMMUNITY): Payer: Medicare Other

## 2011-01-19 ENCOUNTER — Encounter: Payer: Self-pay | Admitting: Family Medicine

## 2011-01-19 ENCOUNTER — Ambulatory Visit (INDEPENDENT_AMBULATORY_CARE_PROVIDER_SITE_OTHER): Payer: Medicare Other | Admitting: Family Medicine

## 2011-01-19 VITALS — BP 130/78 | Temp 98.3°F | Wt 184.0 lb

## 2011-01-19 DIAGNOSIS — E119 Type 2 diabetes mellitus without complications: Secondary | ICD-10-CM

## 2011-01-19 DIAGNOSIS — I10 Essential (primary) hypertension: Secondary | ICD-10-CM

## 2011-01-19 DIAGNOSIS — M949 Disorder of cartilage, unspecified: Secondary | ICD-10-CM

## 2011-01-19 DIAGNOSIS — N390 Urinary tract infection, site not specified: Secondary | ICD-10-CM

## 2011-01-19 LAB — HEMOGLOBIN A1C: Hgb A1c MFr Bld: 7.8 % — ABNORMAL HIGH (ref 4.6–6.5)

## 2011-01-19 MED ORDER — NITROFURANTOIN MONOHYD MACRO 100 MG PO CAPS: 100.0000 mg | ORAL_CAPSULE | Freq: Two times a day (BID) | ORAL | Status: DC

## 2011-01-19 MED ORDER — METFORMIN HCL 1000 MG PO TABS: 1000.0000 mg | ORAL_TABLET | Freq: Two times a day (BID) | ORAL | Status: DC

## 2011-01-19 NOTE — Progress Notes (Signed)
  Subjective:    Patient ID: Lindsay Nguyen, female    DOB: 07/28/1936, 75 y.o.   MRN: [id number]  HPI Patient has chronic problems including hypothyroidism, CAD, type 2 diabetes, hypertension, GERD, and osteopenia. Refer to prior notes. Multiple hospitalizations last fall. She has progressed nicely with  cardiopulmonary rehabilitation and feels much stronger. Getting around better and has improved appetite.  Type 2 diabetes. Fasting blood sugars run 100 130. No symptoms of hyperglycemia. Last A1c 7.3%. Currently metformin 1000 mg morning 500 mg in evening.  Recent treatment for possible UTI out-of-town. Urine culture grew out Escherichia coli. Sensitive to nitrofurantoin. Patient given only 3 days medication. Has seen improvement but not total resolution. No fever or chills.  Patient had myalgias with Crestor. Transition to Lipitor with resolution. Lipids assessed through cardiologist Patient denies recent chest pain  Recent DEXA scan results reviewed with patient. Osteopenia. Takes calcium and vitamin D.  No recent fall.  Review of Systems  Constitutional: Negative for fever, chills, appetite change and unexpected weight change.  Eyes: Negative for visual disturbance.  Cardiovascular: Negative for chest pain, palpitations and leg swelling.  Gastrointestinal: Negative for abdominal pain and blood in stool.  Genitourinary: Positive for dysuria. Negative for flank pain.  Musculoskeletal: Negative for myalgias.  Neurological: Negative for dizziness, syncope and headaches.  Hematological: Does not bruise/bleed easily.  Psychiatric/Behavioral: Negative for dysphoric mood.       Objective:   Physical Exam  Constitutional: She is oriented to person, place, and time. She appears well-developed and well-nourished. No distress.  HENT:  Right Ear: External ear normal.  Left Ear: External ear normal.  Mouth/Throat: Oropharynx is clear and moist.  Neck: Neck supple.  Cardiovascular: Normal  rate and regular rhythm.   Pulmonary/Chest: Effort normal and breath sounds normal. No respiratory distress. She has no wheezes. She has no rales.  Musculoskeletal: She exhibits no edema.  Lymphadenopathy:    She has no cervical adenopathy.  Neurological: She is alert and oriented to person, place, and time.          Assessment & Plan:  #1 type 2 diabetes. Reassess A1c.  Titrate metformin if indicated. #2 recent UTI. Expand treatment with nitrofurantoin to 7 days. #3 osteopenia. Continue calcium and vitamin D along with weight-bearing exercise. Repeat DEXA in 2 years

## 2011-01-19 NOTE — Progress Notes (Signed)
Quick Note:  Pt informed on home VM ______ 

## 2011-01-22 ENCOUNTER — Ambulatory Visit: Payer: Medicare Other | Admitting: Family Medicine

## 2011-01-22 ENCOUNTER — Encounter: Payer: Self-pay | Admitting: Family Medicine

## 2011-01-26 ENCOUNTER — Telehealth: Payer: Self-pay | Admitting: Family Medicine

## 2011-01-26 NOTE — Telephone Encounter (Signed)
I called pt, informed we have not heard from [facility] electronically of phone call, not sure how we can help???  Pt informed and we will wait to hear from [facility] of her if she receives any more information

## 2011-01-26 NOTE — Telephone Encounter (Signed)
Pt called and said that [facility] told pt that Metformin could not be filled until they speak with Dr [name] Never. Would not say why. Pt was told that if they didn't talk to doctor by Monday 01/26/11 that the order for script would be cancelled.

## 2011-02-02 ENCOUNTER — Telehealth: Payer: Self-pay | Admitting: Family Medicine

## 2011-02-02 MED ORDER — METFORMIN HCL 1000 MG PO TABS: 1000.0000 mg | ORAL_TABLET | Freq: Two times a day (BID) | ORAL | Status: DC

## 2011-02-02 NOTE — Telephone Encounter (Signed)
Rx for Metformin resent, we have not sent member ID because we do not have that information, she will need to provide that information

## 2011-02-02 NOTE — Telephone Encounter (Signed)
Pt called and said that [facility] would not fill pts Metformin script with pt address info. Pt is req that Dr [name] Never resend the script for metFORMIN (GLUCOPHAGE) 1000 MG tablet to [facility] mail order and to be sure to include pts address info, mem id, and dob.

## 2011-02-03 ENCOUNTER — Other Ambulatory Visit: Payer: Self-pay | Admitting: *Deleted

## 2011-04-04 ENCOUNTER — Encounter: Payer: Self-pay | Admitting: Family Medicine

## 2011-04-04 ENCOUNTER — Ambulatory Visit (INDEPENDENT_AMBULATORY_CARE_PROVIDER_SITE_OTHER): Payer: Medicare Other | Admitting: Family Medicine

## 2011-04-04 VITALS — BP 110/68 | HR 73 | Temp 98.6°F | Wt 186.0 lb

## 2011-04-04 DIAGNOSIS — R0600 Dyspnea, unspecified: Secondary | ICD-10-CM

## 2011-04-04 DIAGNOSIS — R0609 Other forms of dyspnea: Secondary | ICD-10-CM

## 2011-04-04 DIAGNOSIS — J180 Bronchopneumonia, unspecified organism: Secondary | ICD-10-CM | POA: Insufficient documentation

## 2011-04-04 MED ORDER — ALBUTEROL SULFATE (2.5 MG/3ML) 0.083% IN NEBU: 2.5000 mg | INHALATION_SOLUTION | Freq: Four times a day (QID) | RESPIRATORY_TRACT | Status: DC | PRN

## 2011-04-04 MED ORDER — PREDNISONE 20 MG PO TABS: ORAL_TABLET | ORAL | Status: DC

## 2011-04-04 MED ORDER — ALBUTEROL SULFATE (2.5 MG/3ML) 0.083% IN NEBU: 2.5000 mg | INHALATION_SOLUTION | RESPIRATORY_TRACT | Status: AC

## 2011-04-04 MED ORDER — CHLORPHENIRAMINE-HYDROCODONE 8-10 MG/5ML PO LQCR: 5.0000 mL | Freq: Two times a day (BID) | ORAL | Status: DC | PRN

## 2011-04-04 MED ORDER — LEVOFLOXACIN 500 MG PO TABS: 500.0000 mg | ORAL_TABLET | Freq: Every day | ORAL | Status: AC

## 2011-04-06 NOTE — Assessment & Plan Note (Addendum)
She felt improved with albuterol 2.5mg  neb in office today. Will start levaquin 500mg  qd, prednisone taper x 15d (60-40-20), home alb nebs q4h prn, tussionex prn.

## 2011-04-06 NOTE — Progress Notes (Signed)
OFFICE VISIT  04/06/2011   CC:  Chief Complaint  Patient presents with  . Wheezing  . Cough     HPI:    Patient is a 75 y.o. Caucasian female who presents for cough. Onset about 1 wk ago, nasal congestion, PND, some mild HA, scratchy throat, mild cough. Since then most of the upper resps sx's have improved, but cough is worsening, feels subjective fever.  Cough is nonproductive.  No SOB. Feeling very tired. Family member had albuterol nebs at home and she has been doing these a few times a day last couple of days and it does help. She has an MDI at home as well.  Past Medical History  Diagnosis Date  . Anemia   . Diabetes mellitus   . Hyperlipidemia   . Hypertension   . Thyroid disease     hypothyroid  . GERD (gastroesophageal reflux disease)   . Osteopenia     Past Surgical History  Procedure Date  . Tubal ligation 1971  . Tonsillectomy 1945  . Elbow surgery 1979    fracture left    Outpatient Prescriptions Prior to Visit  Medication Sig Dispense Refill  . ALPRAZolam (XANAX) 0.5 MG tablet Take 1 tablet (0.5 mg total) by mouth at bedtime as needed.  90 tablet  1  . amLODipine (NORVASC) 5 MG tablet Take 1 tablet (5 mg total) by mouth daily.  90 tablet  3  . Ascorbic Acid (VITAMIN C) 1000 MG tablet Take 1,000 mg by mouth daily.        Marland Kitchen aspirin 81 MG tablet Take 81 mg by mouth daily.        Marland Kitchen atorvastatin (LIPITOR) 20 MG tablet       . carvedilol (COREG) 12.5 MG tablet Take 1 tablet (12.5 mg total) by mouth 2 (two) times daily with a meal.  180 tablet  3  . Cholecalciferol (VITAMIN D3) 2000 UNITS TABS Take by mouth daily.        Marland Kitchen escitalopram (LEXAPRO) 5 MG tablet Take 1 tablet (5 mg total) by mouth daily.  90 tablet  3  . esomeprazole (NEXIUM) 20 MG capsule Take 1 capsule (20 mg total) by mouth daily before breakfast.  90 capsule  3  . folic acid (FOLVITE) 1 MG tablet Take 1 mg by mouth daily.        . furosemide (LASIX) 40 MG tablet TAKE 1 TABLET DAILY  90 tablet   2  . HYDROcodone-acetaminophen (VICODIN) 5-500 MG per tablet Take 1 tablet by mouth every 6 (six) hours as needed.        . iron polysaccharides (FERREX 150) 150 MG capsule Take 1 capsule (150 mg total) by mouth daily.  90 capsule  3  . levothyroxine (SYNTHROID, LEVOTHROID) 112 MCG tablet 1/2 tab daily, BRAND only per Dr [name] Never  45 tablet  3  . metFORMIN (GLUCOPHAGE) 1000 MG tablet Take 1,000 mg by mouth 2 (two) times daily with a meal.        . nitrofurantoin, macrocrystal-monohydrate, (MACROBID) 100 MG capsule Take 1 capsule (100 mg total) by mouth 2 (two) times daily.  8 capsule  0  . nitroGLYCERIN (NITROSTAT) 0.4 MG SL tablet Place 0.4 mg under the tongue every 5 (five) minutes as needed.        . potassium chloride SA (K-DUR,KLOR-CON) 20 MEQ tablet Take 1 tablet (20 mEq total) by mouth daily.  90 tablet  3  . rosuvastatin (CRESTOR) 10 MG tablet Take 1 tablet (  10 mg total) by mouth daily.  90 tablet  3  . valsartan (DIOVAN) 160 MG tablet Take 1 tablet (160 mg total) by mouth daily.  90 tablet  3    Allergies  Allergen Reactions  . Gabapentin     REACTION: hives  . Iodine     REACTION: throat swelling    ROS As per HPI  PE: Blood pressure 110/68, pulse 73, temperature 98.6 F (37 C), temperature source Oral, weight 186 lb (84.369 kg), SpO2 90.00%. VS: noted--normal. Gen: alert, NAD, NONTOXIC APPEARING. HEENT: eyes without injection, drainage, or swelling.  Ears: EACs clear, TMs with normal light reflex and landmarks.  Nose: Clear rhinorrhea, with some dried, crusty exudate adherent to mildly injected mucosa.  No purulent d/c.  No paranasal sinus TTP.  No facial swelling.  Throat and mouth without focal lesion.  No pharyngial swelling, erythema, or exudate.   Neck: supple, no LAD.   LUNGS: diffuse mild exp rhonchorous breath sounds, with insp crackles in left posterior lung field from base to half Kolker up, with nonlabored resps.   CV: RRR, no m/r/g. EXT: no c/c/e SKIN: no  rash    LABS:  none  IMPRESSION AND PLAN:  Bronchopneumonia She felt improved with albuterol 2.5mg  neb in office today. Will start levaquin 500mg  qd, prednisone taper x 15d (60-40-20), home alb nebs q4h prn, tussionex prn.      FOLLOW UP: Return in about 5 days (around 04/09/2011) for f/u bronchopneumonia.

## 2011-04-23 ENCOUNTER — Ambulatory Visit: Payer: Medicare Other | Admitting: Family Medicine

## 2011-05-01 ENCOUNTER — Other Ambulatory Visit: Payer: Self-pay | Admitting: *Deleted

## 2011-05-01 NOTE — Telephone Encounter (Signed)
Refill request Alprazolam 0.5, last filled 11-13-10, #90 with 1 refill.  Last OV 04/23/11

## 2011-05-01 NOTE — Telephone Encounter (Signed)
I spoke with pharmacist, pt has changed to another provider, they will correct their information

## 2011-05-01 NOTE — Telephone Encounter (Signed)
My understanding is that she changed practices. Has she establish with new primary care yet? If so, will need to get refilled through them.

## 2011-05-04 ENCOUNTER — Other Ambulatory Visit: Payer: Self-pay | Admitting: *Deleted

## 2011-05-04 NOTE — Telephone Encounter (Signed)
Faxed request received from pharmacy for refill.  Last seen by Dr. Milinda Cave at Saturday [facility].  Dr. [name] Never patient.

## 2011-05-04 NOTE — Telephone Encounter (Signed)
Received request last week. Did she transfer care?  She has signed request to transfer (by my recollection).  If not, may refill times 3.

## 2011-05-05 ENCOUNTER — Telehealth: Payer: Self-pay | Admitting: *Deleted

## 2011-05-05 NOTE — Telephone Encounter (Signed)
Left another message at pt home to clarify if she has transferred her PCP care

## 2011-05-05 NOTE — Telephone Encounter (Signed)
VM from Pt daughter called to confirm that her mother is seeing a physician in [place] (where her daughter moved).  She plans on still seeing Dr [name] Never when she is in town as she is going between both cities half and half.  The Aprazolam was filled by provider in [place].  I refused the refill request from [facility]. FYI

## 2011-05-07 NOTE — Telephone Encounter (Signed)
Daughter called back and informed us the other physician in [place] filled med.

## 2011-05-18 ENCOUNTER — Ambulatory Visit (INDEPENDENT_AMBULATORY_CARE_PROVIDER_SITE_OTHER)
Admission: RE | Admit: 2011-05-18 | Discharge: 2011-05-18 | Disposition: A | Discharge status: Home / Self Care | Payer: Medicare Other | Source: Ambulatory Visit | Attending: Internal Medicine | Admitting: Internal Medicine

## 2011-05-18 ENCOUNTER — Ambulatory Visit (INDEPENDENT_AMBULATORY_CARE_PROVIDER_SITE_OTHER): Payer: Medicare Other | Admitting: Internal Medicine

## 2011-05-18 ENCOUNTER — Encounter: Payer: Self-pay | Admitting: Internal Medicine

## 2011-05-18 VITALS — BP 110/72 | HR 74 | Temp 98.2°F | Ht 69.0 in | Wt 191.8 lb

## 2011-05-18 DIAGNOSIS — J90 Pleural effusion, not elsewhere classified: Secondary | ICD-10-CM

## 2011-05-18 DIAGNOSIS — J180 Bronchopneumonia, unspecified organism: Secondary | ICD-10-CM

## 2011-05-18 NOTE — Patient Instructions (Signed)
Please have cxr today If this looks normal, no further need for followup

## 2011-05-18 NOTE — Progress Notes (Signed)
Subjective:    Patient ID: Lindsay Nguyen, female    DOB: 05/14/1936, 75 y.o.   MRN: [id number]  HPI August 20, 2010: This is a 75 year old never smoker who has been in and out of hospital x 3 since mid nov 2011. Initially underwent CABG 06/18/2010 but post op course was prolonged. She developed acute cholecystitis at that time and needed drain with plans for cholecystectomy later in Jan 2012. and she was admited for total 1 month and discharged 07/16/2010.  But readmitted 2 days later on 07/18/2010 for failure to thrive and nausea. Evaluation showed moderate-large sized  Rt pleural effusion. Thoracentesis 12/23 was bloody and revealed idiopathic exudate with total 1L removed (cytol reactive mesothelial cells, protein 4.3, ldh 190). This iswhen we were involved. Post thora CT 07/05/2010 suggested RLL pna as potential  etiology with residual rt effusion now largely subpulmpnic. Marland KitchenReview of serial imaging also showed that she actually developed this effusion newly in the aftermath of CABG on 06/18/2010. Therefore, this was considered as potential etiology as well  Subseuqently discharged 08/01/2010 to SNF[facility]hab but readdmitted 08/06/2010 - 08/11/2010 for hypoglycemia. CT abdomen 08/07/2010 still shows persistence of rt subpulmnic effusion.  Currently still in SNF-rehab. Son and she state she is doing real well. Gaining stregnth, and appetite. Walking with walker now. Feeling stronger. No confusion.Denies nausea, vomit, diarrhea, dyspnea, chest pain, wheeze. She is looking forward to her cholecystectomy. Main issue at [facility] is followup for right pleural effusion.  REC: OK FOR CHOLE, ROV 6 WEEKS WITH CXR  October 01, 2010: followup Rt pleural effusion. IN interim, had uneventful cholecystectomy on 09/08/2010. She is now home. Going to stat cardiac rehab. ECOG has imprvoed to 1-2. Getting slowly fit. Doing ADLs. Looking at driving again. Saw Dr. Laneta Simmers on 2/21 and cleard from followup. CXR 09/23/2010 shows  persistence / small improvement of small rt pleural effusion. She denies dyspnea, cough, fever, sputum, weight loss, edema though.   REC: 1. Reassure 2. ROV 9 months with CXR  OV 05/18/11:  Followup for residual right pleural effusion (see above). Presents with son Kathlene November (ex RN for Dr Maple Hudson). Overall feels well. Perturbed she had "pneumonia x 2" back in July 2012. In May 2012 took 5 day nitrofurantoin for UTI. Then in early July 2012 flew to [place] with sister. 2-3 weeks later both her and sister developed URI, cough, sputum. CXR at [facility], [place] 02/24/11 was reportedly called as pneumonia (ct scan copy looks normal to me). Repeat cxr 7/31 is also clear. Took 1 course abx; did not get better. Then at fu given pred burst and 2nd abx. Spirometry at PMD office; reportedly normal. After that well. Has some associated edema around that time but resolved upoin dc of coreg and norvasc. No hemoptysis. No chest pain. Currently well and active. Of note, the right pleural effusion that was noted in feb 2012 appears resolved in July cxr done at [facility]. Updated: CXR today after she left: show resolution of pleural effusion  Social: Now moved to [place], [place] to be with dtr. New PMD is Dr Allena Katz in [place].  Fam hx: no change Past med: reviewed no change other than as in HPI   Review of Systems  Constitutional: Negative for fever and unexpected weight change.  HENT: Negative for ear pain, nosebleeds, congestion, sore throat, rhinorrhea, sneezing, trouble swallowing, dental problem, postnasal drip and sinus pressure.   Eyes: Negative for redness and itching.  Respiratory: Negative for cough, chest tightness, shortness of breath and  wheezing.   Cardiovascular: Negative for palpitations and leg swelling.  Gastrointestinal: Negative for nausea and vomiting.  Genitourinary: Negative for dysuria.  Musculoskeletal: Negative for joint swelling.  Skin: Negative for rash.  Neurological: Negative for headaches.    Hematological: Does not bruise/bleed easily.  Psychiatric/Behavioral: Negative for dysphoric mood. The patient is not nervous/anxious.        Objective:   Physical Exam  Vitals reviewed. Constitutional: She is oriented to person, place, and time. She appears well-developed and well-nourished. No distress.       overweight  HENT:  Head: Normocephalic and atraumatic.  Right Ear: External ear normal.  Left Ear: External ear normal.  Mouth/Throat: Oropharynx is clear and moist. No oropharyngeal exudate.  Eyes: Conjunctivae and EOM are normal. Pupils are equal, round, and reactive to light. Right eye exhibits no discharge. Left eye exhibits no discharge. No scleral icterus.  Neck: Normal range of motion. Neck supple. No JVD present. No tracheal deviation present. No thyromegaly present.  Cardiovascular: Normal rate, regular rhythm and intact distal pulses.  Exam reveals no gallop and no friction rub.   Murmur heard. Pulmonary/Chest: Effort normal and breath sounds normal. No respiratory distress. She has no wheezes. She has no rales. She exhibits no tenderness.  Abdominal: Soft. Bowel sounds are normal. She exhibits no distension and no mass. There is no tenderness. There is no rebound and no guarding.  Musculoskeletal: Normal range of motion. She exhibits no edema and no tenderness.  Lymphadenopathy:    She has no cervical adenopathy.  Neurological: She is alert and oriented to person, place, and time. She has normal reflexes. No cranial nerve deficit. She exhibits normal muscle tone. Coordination normal.  Skin: Skin is warm and dry. No rash noted. She is not diaphoretic. No erythema. No pallor.  Psychiatric: She has a normal mood and affect. Her behavior is normal. Judgment and thought content normal.         Assessment & Plan:

## 2011-05-18 NOTE — Assessment & Plan Note (Addendum)
Currently no evidence. Today CXR is clear. In retrospect, pneumonia was likely was result of travel and not nitrafurantoin  Plan reassure

## 2011-06-01 ENCOUNTER — Telehealth: Payer: Self-pay | Admitting: Internal Medicine

## 2011-06-01 IMAGING — CR DG CHEST 2V
2 series · 2 of 2 positions shown · non-contrast
Comparison: One-view chest x-ray yesterday post thoracentesis.  Two-
view chest x-ray earlier yesterday, 07/22/2010, and 06/27/2010.

CLINICAL DATA: Follow up right pleural effusion post thoracentesis.

CHEST - 2 VIEW 07/25/2010:

[w chest pa]
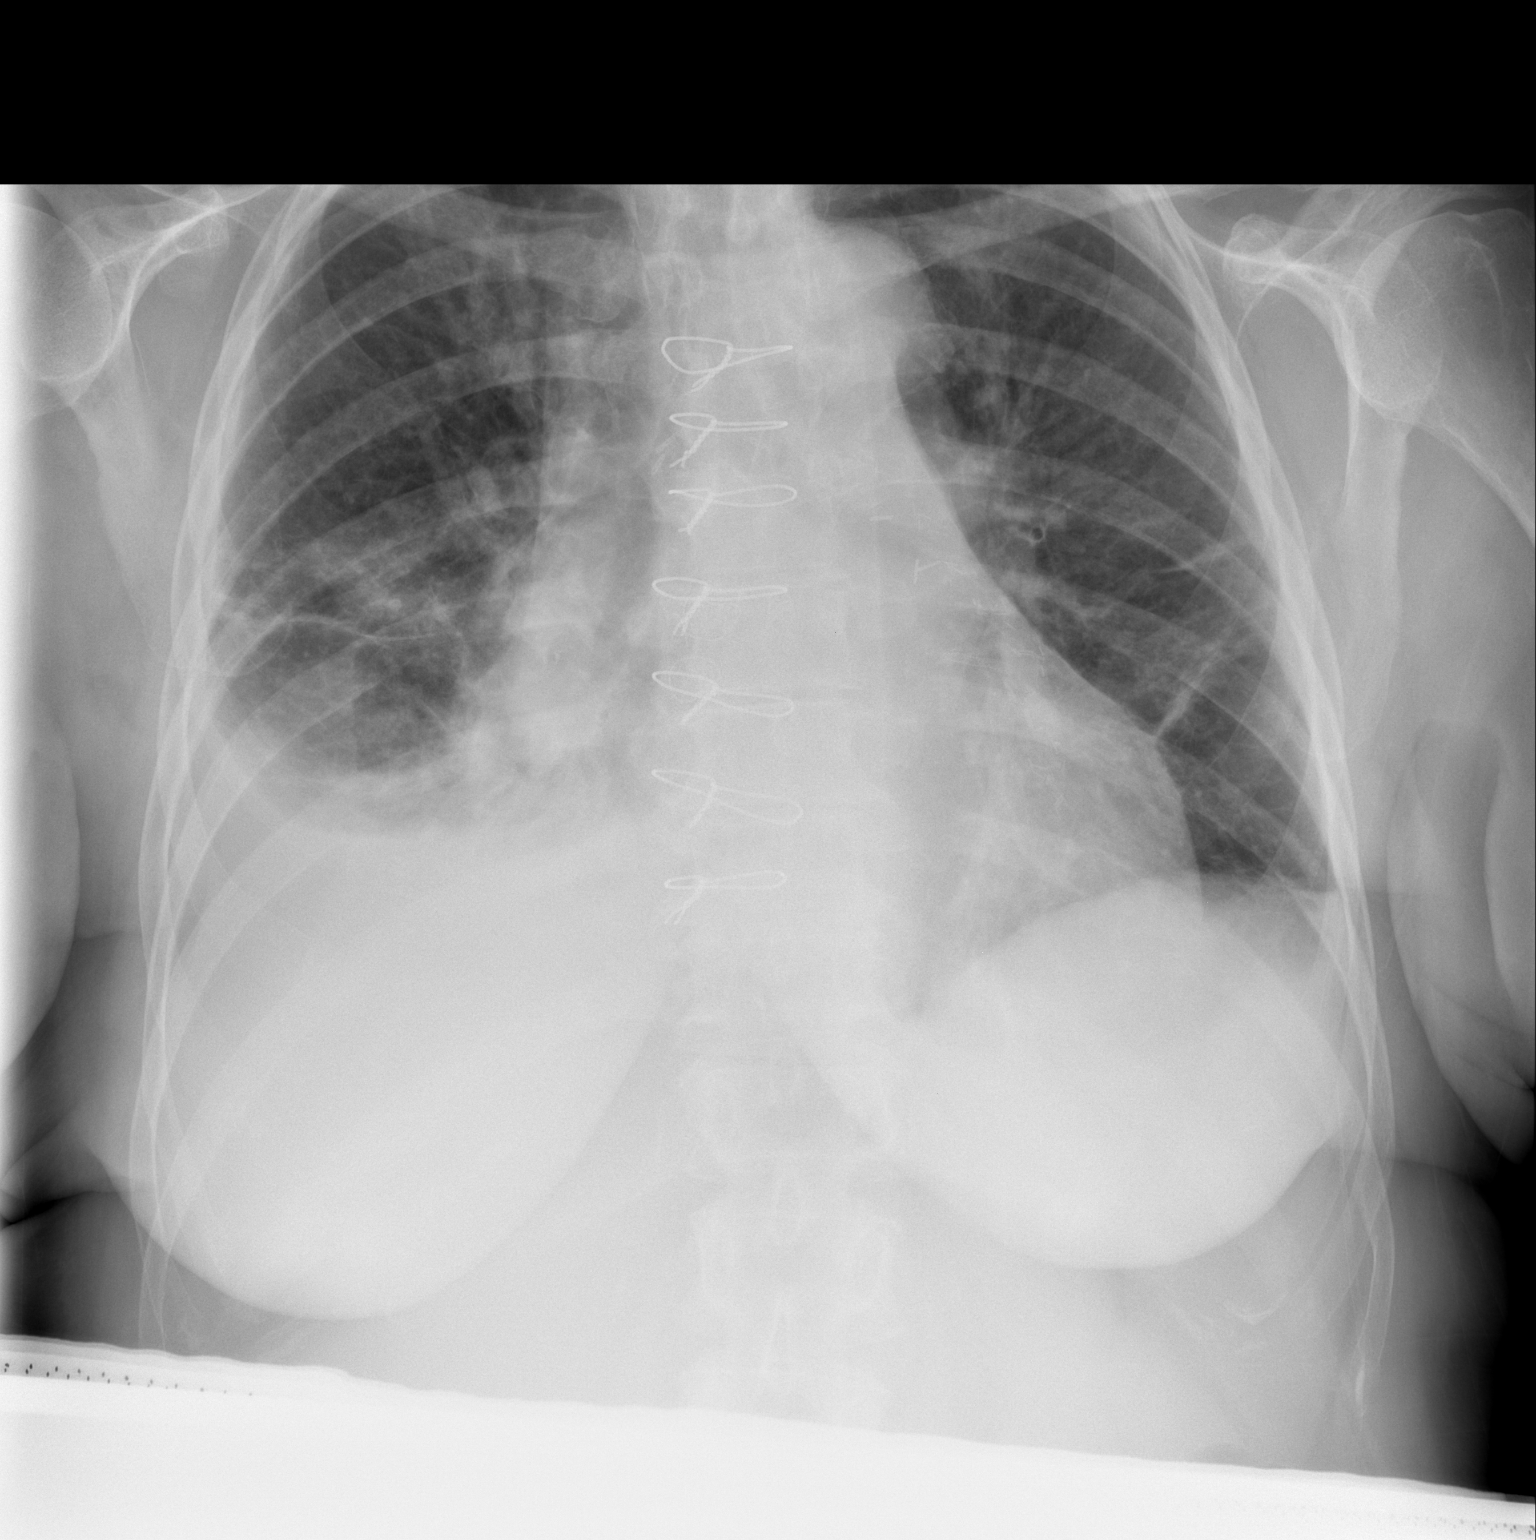

[w chest lat]
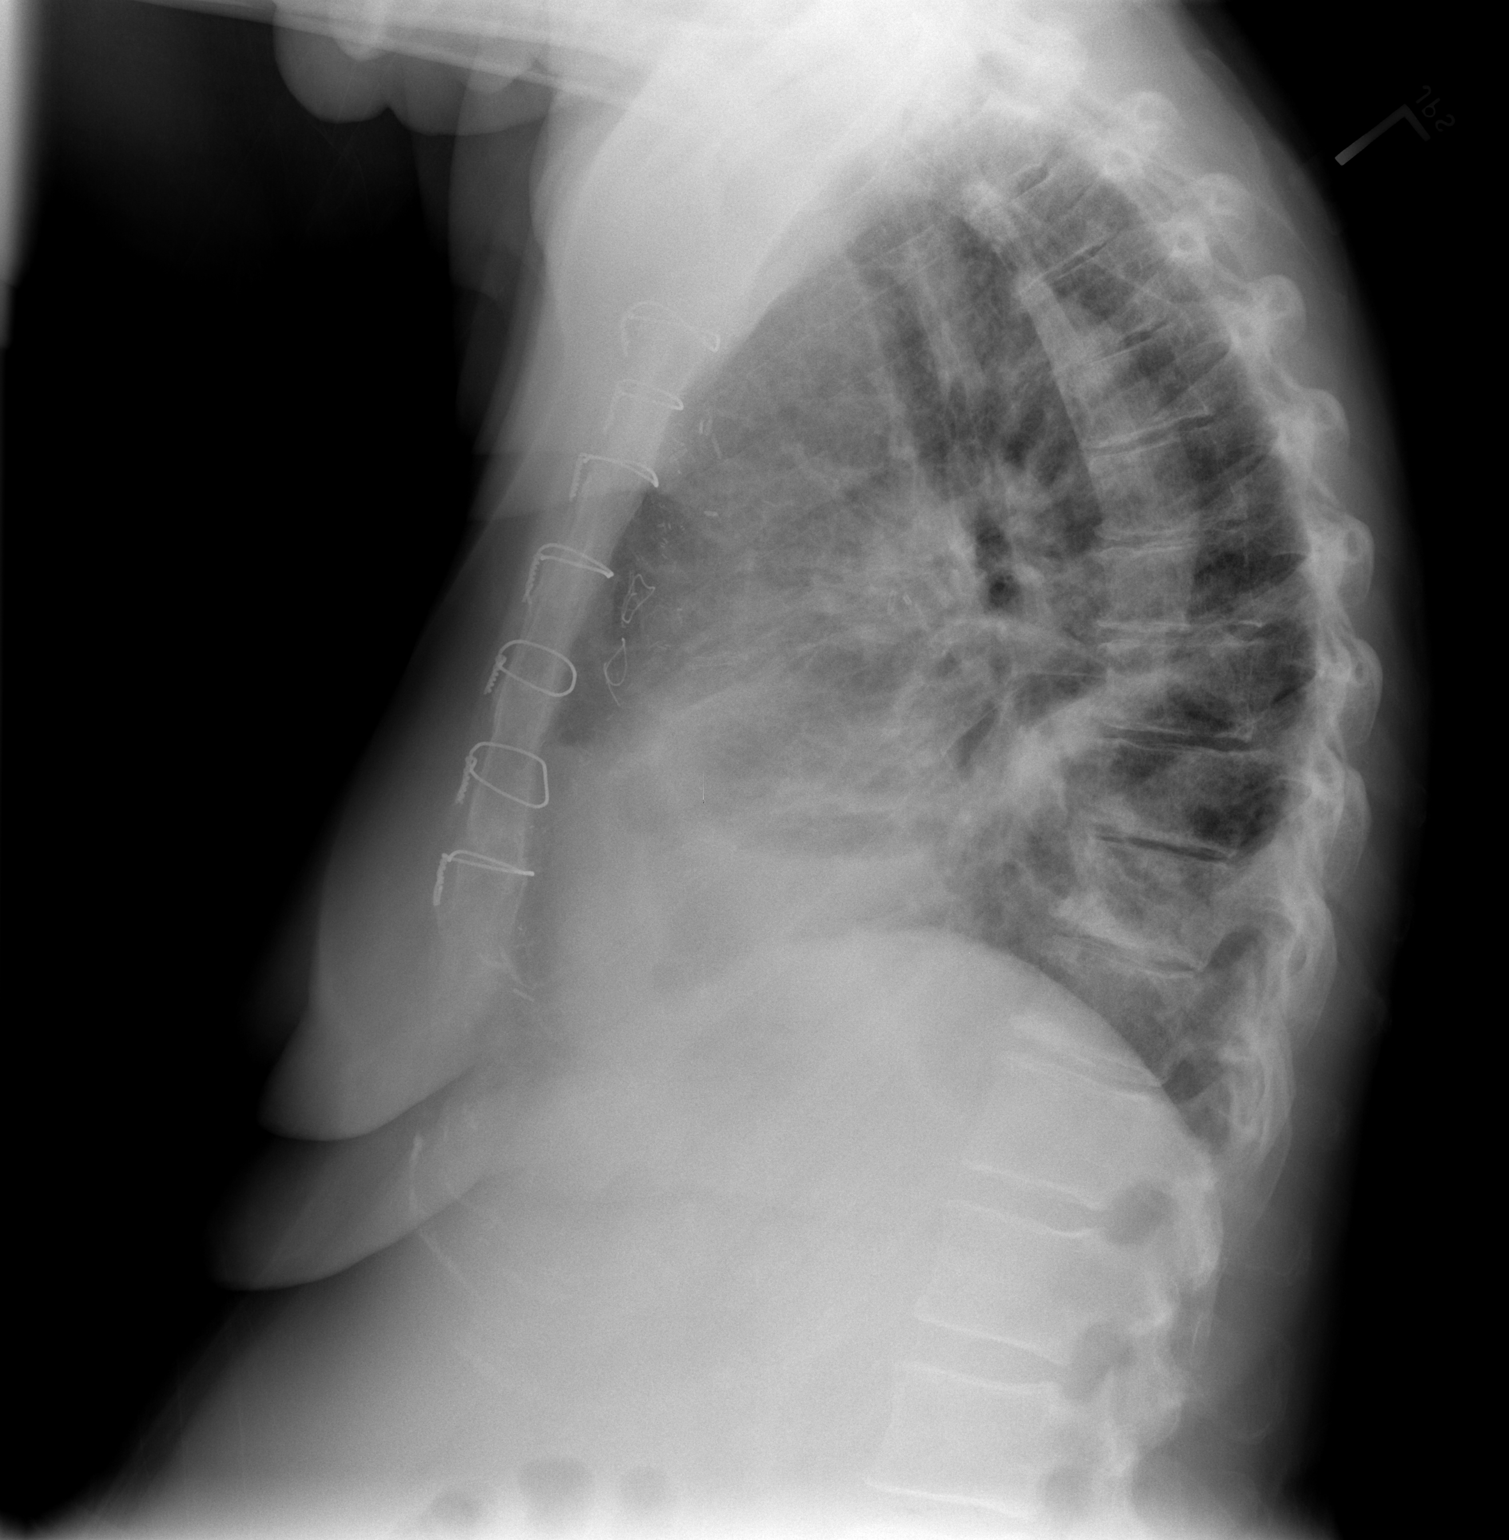

[2 of 2 positions shown; findings below may reference images not displayed]

FINDINGS: Prior sternotomy for CABG.  Cardiac silhouette moderately
enlarged but stable.  Mild pulmonary venous hypertension without
overt edema.  Stable moderately large right pleural effusion and
associated consolidation in the right lower lobe.  Areas of linear
atelectasis in the left upper lobe, lingula, and inferior right
upper lobe, unchanged.  No new pulmonary parenchymal abnormalities.
No visible left pleural effusion.  Thoracic aorta tortuous
atherosclerotic, unchanged.  Hilar and mediastinal contours
otherwise unremarkable.  Degenerative changes involving the
thoracic spine.
IMPRESSION: Stable moderately large right pleural effusion and associated dense
passive atelectasis and/or pneumonia in the right lower lobe.
Stable linear atelectasis throughout both lungs.  Cardiomegaly and
mild  pulmonary venous hypertension without overt pulmonary edema.
No new abnormalities.

## 2011-06-01 IMAGING — CT CT CHEST W/O CM
2 of 3 series · 15 of 36 positions shown, 18 images · non-contrast
Comparison: Chest radiographs from the same day.  Chest CTA
06/28/2010.

CLINICAL DATA: 74-year-old female with pleural effusion.  History
of renal failure, pancreatitis, aortic repair.

CT CHEST WITHOUT CONTRAST
TECHNIQUE: Multidetector CT imaging of the chest was performed
following the standard protocol without IV contrast.

[Series 2: routine chest 5.0 st · axial · 0.63mm/px · z∈[-253,+37]mm · 12 of 68 slices shown, 15 images]
[im 5/68  mediastinal]
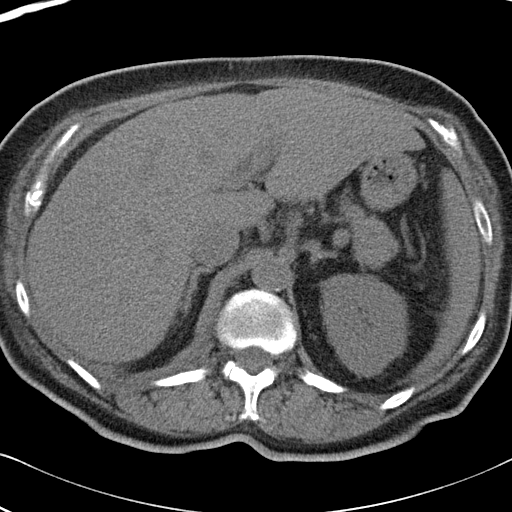
[im 5/68  lung]
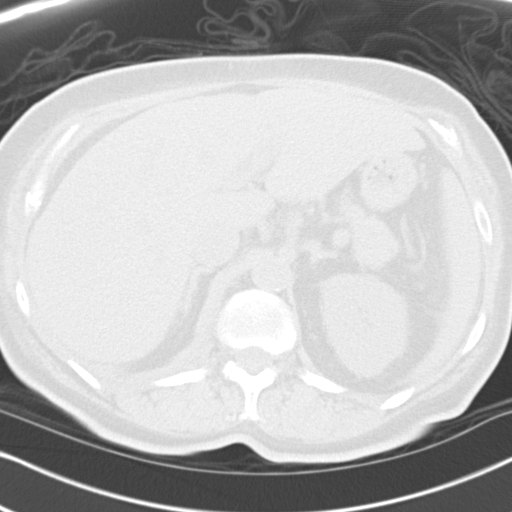
[im 10/68  lung]
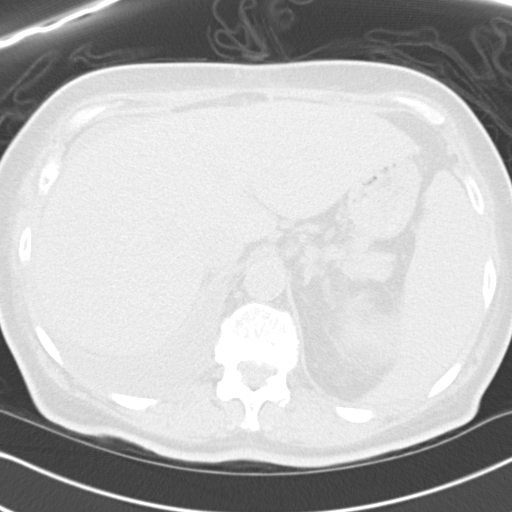
[im 15/68  lung]
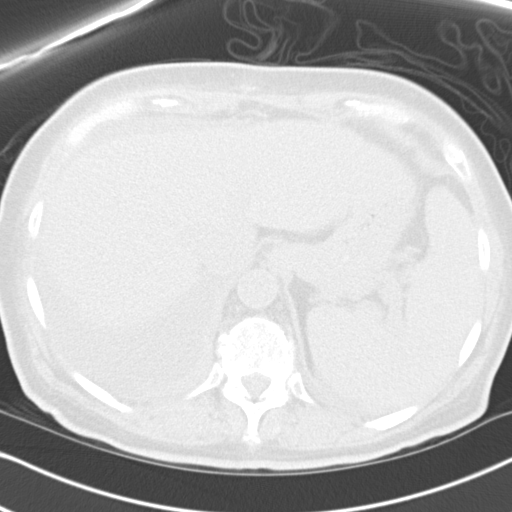
[im 20/68  lung]
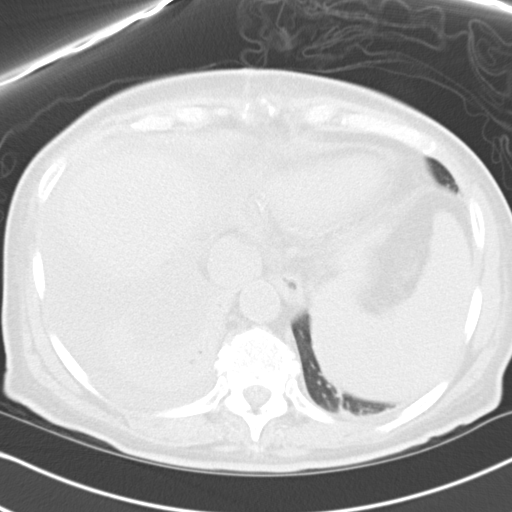
[im 25/68  mediastinal]
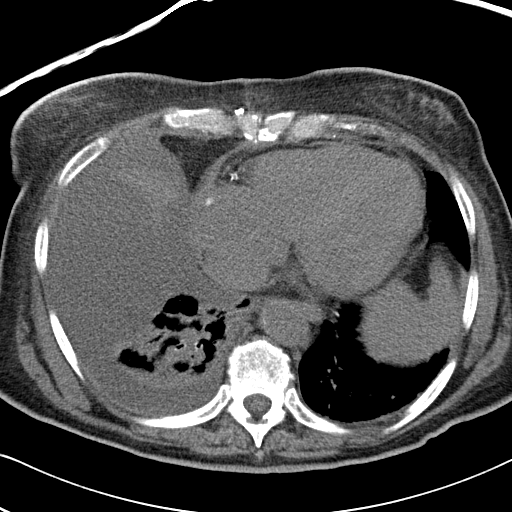
[im 25/68  lung]
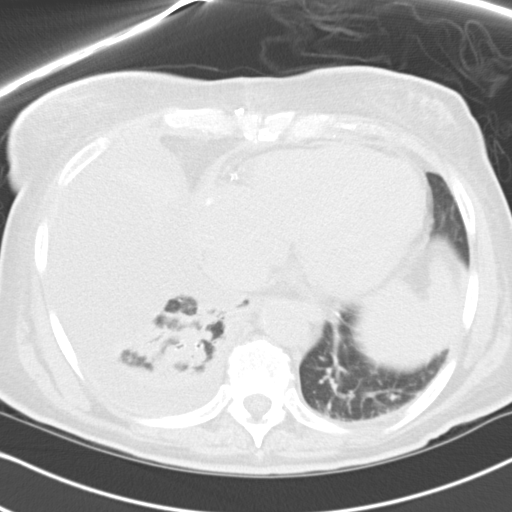
[im 30/68  lung]
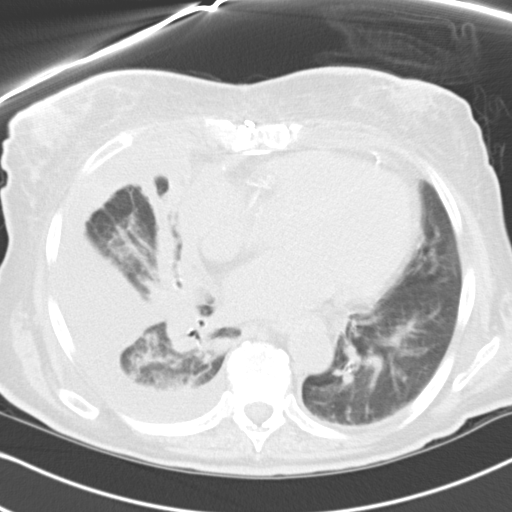
[im 38/68  lung]
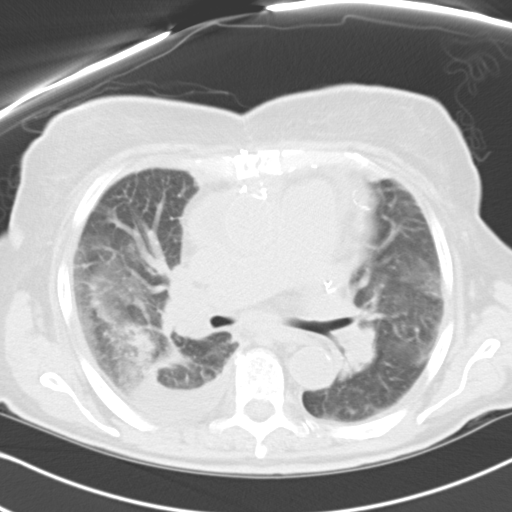
[im 43/68  lung]
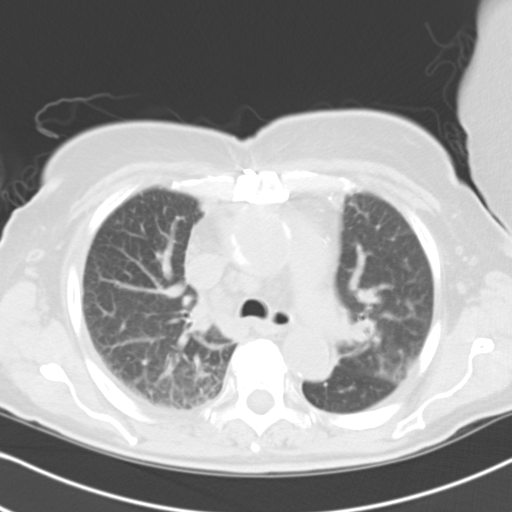
[im 48/68  mediastinal]
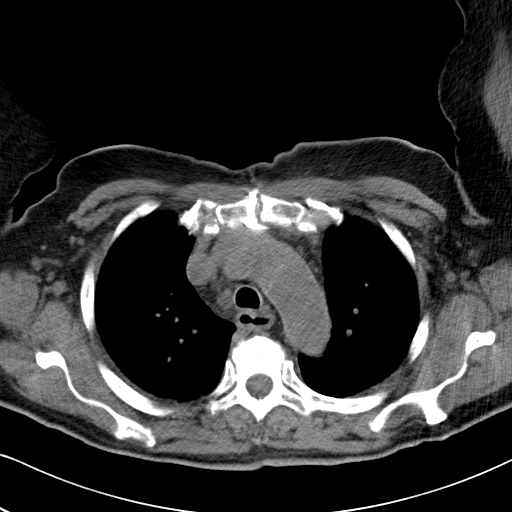
[im 48/68  lung]
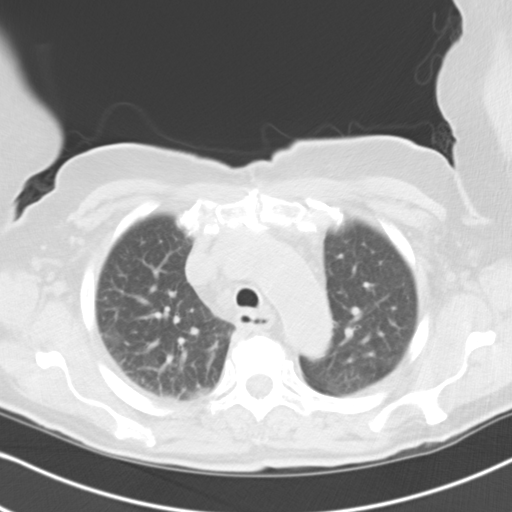
[im 53/68  lung]
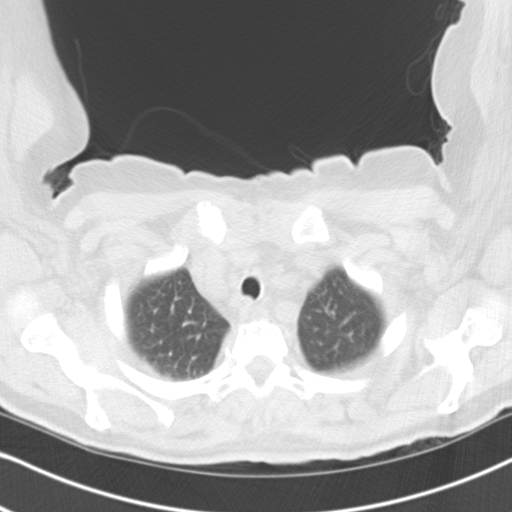
[im 58/68  lung]
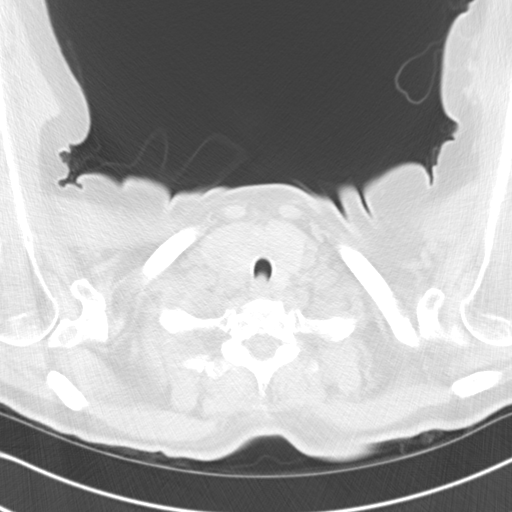
[im 63/68  lung]
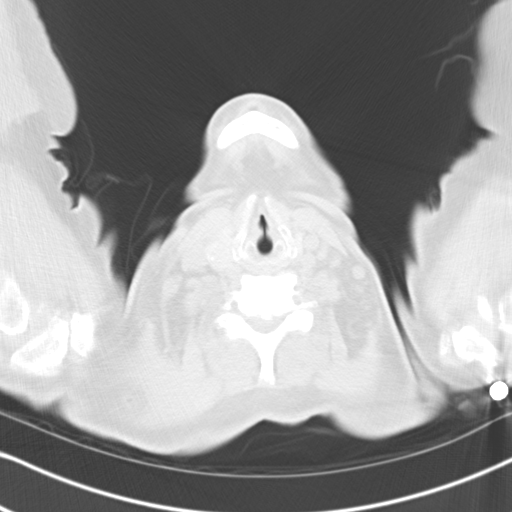

[Series 603: coronals · coronal · 0.66mm/px · 3 of 103 slices shown]
[im 21/103  lung]
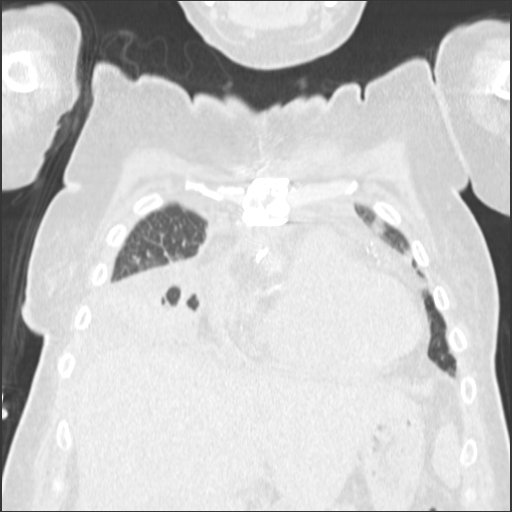
[im 41/103  lung]
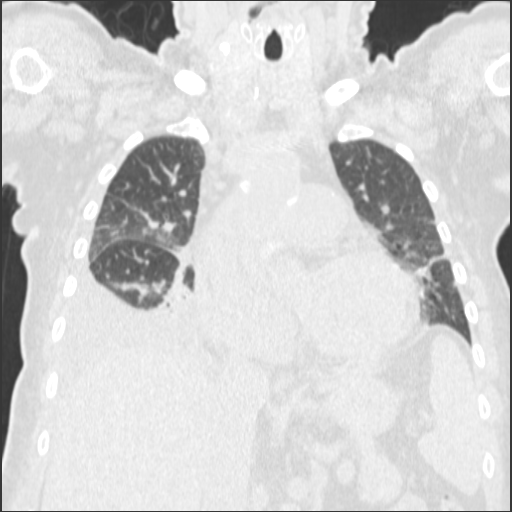
[im 62/103  lung]
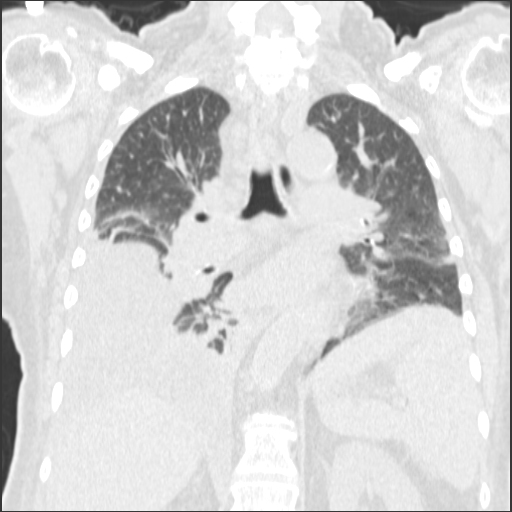

[15 of 36 positions shown; findings below may reference images not displayed]

FINDINGS: Moderate volume of pleural fluid on the right is largely
subpulmonic, this was more posteriorly located on the comparison
CTA.  Densitometry suggests transudate.

Patchy and nodular peribronchovascular opacity in the right lower
lobe and middle lobe.  Interval regression of small layering left
effusion, now only trace residual.  Interval improved left lung
ventilation.

Cardiomegaly.  Sequelae median sternotomy and ascending aorta
repair.  Persistent small fluid collection along the right lateral
base of the aorta (series 2 image 33) anterior to the SVC.  No IV
contrast utilized today.  No significant pericardial effusion.

Mildly increased size of precarinal and right paratracheal lymph
nodes, now up to 10 mm in short axis (previously 7 mm).  Stable
thyromegaly with bilateral calcified and low-density thyroid
nodules.

Stable visualized upper abdominal viscera.  There is mild
dilatation of the hepatic IVC and hepatic veins.

Surgical margins of the median sternotomy show interval born bone
resorption (series 3 image 19 and elsewhere).  No definite
retrosternal fluid collection.  No superficial parasternal
inflammatory changes.

Stable thoracic spine.  No other acute osseous abnormality.
IMPRESSION: 1.  Moderate sized right pleural effusion but now largely
subpulmonic.  Increased right lower lobe streaky and nodular
opacity could reflect acute on chronic atelectasis, but pulmonary
infection is not excluded.
2.  Interval bone resorption along the osteotomy margins of the
median sternotomy, but no other strong imaging findings to suggest
a sternal/wound infection. Clinical correlation recommended.
3.  Mildly increased mediastinal lymph nodes up to 10 mm in short
axis.  Otherwise stable postoperative appearance of the mediastinum
on this noncontrast exam.

## 2011-06-01 NOTE — Telephone Encounter (Signed)
Let her know that cxr shows effusion resolved. No pneumonia. No further fu needed   I spoke with patient about results and she verbalized understanding and had no questions

## 2011-06-01 NOTE — Assessment & Plan Note (Signed)
Looks like clinically and based on outside summer cxr it has resolved (rt efufison). Will check cxr here and if resolution persists, dc from followup

## 2011-06-04 IMAGING — CR DG CHEST 2V
2 series · 2 of 2 positions shown · non-contrast
Comparison: 07/25/2010

CLINICAL DATA: Right pleural effusion

CHEST - 2 VIEW

[w chest pa]
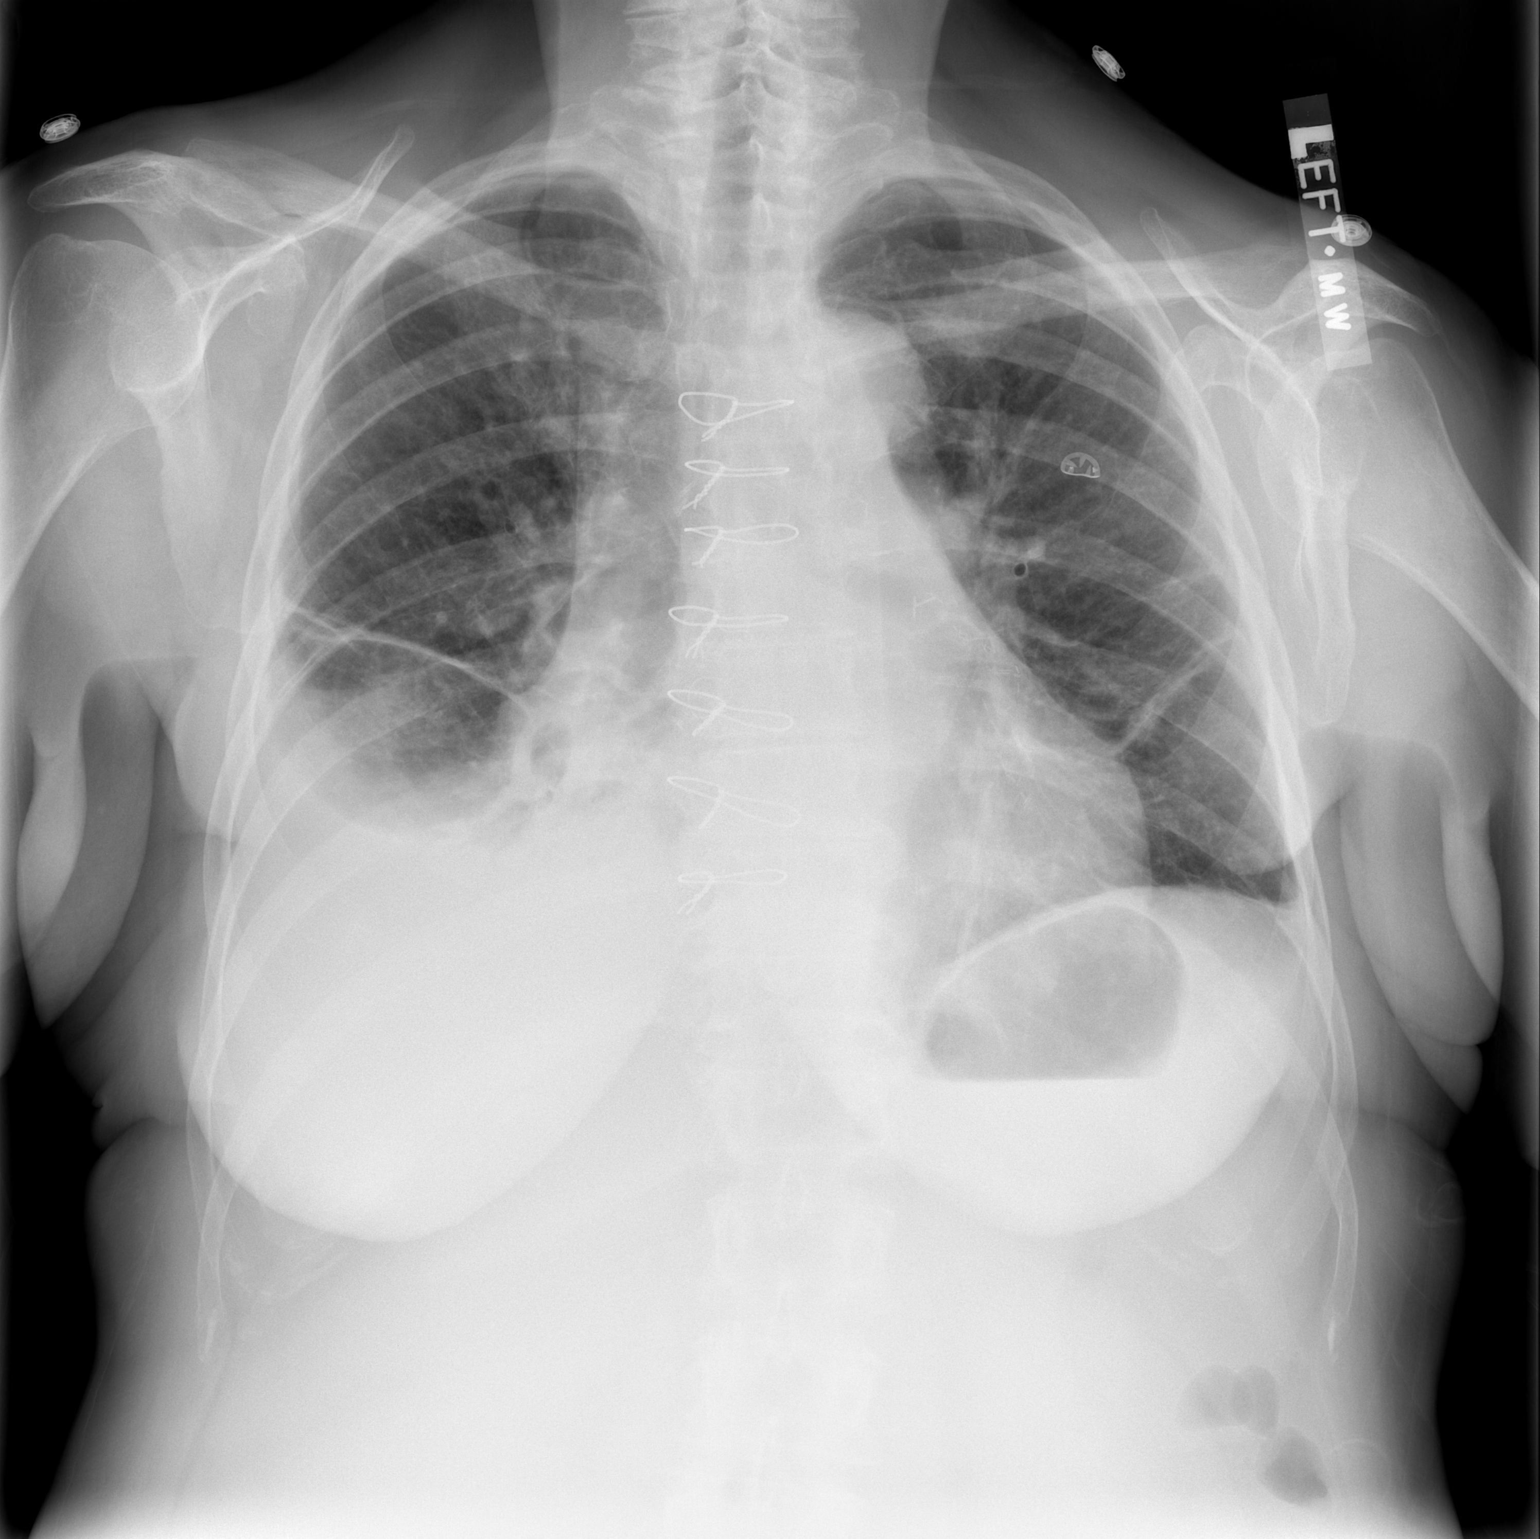

[w chest lat]
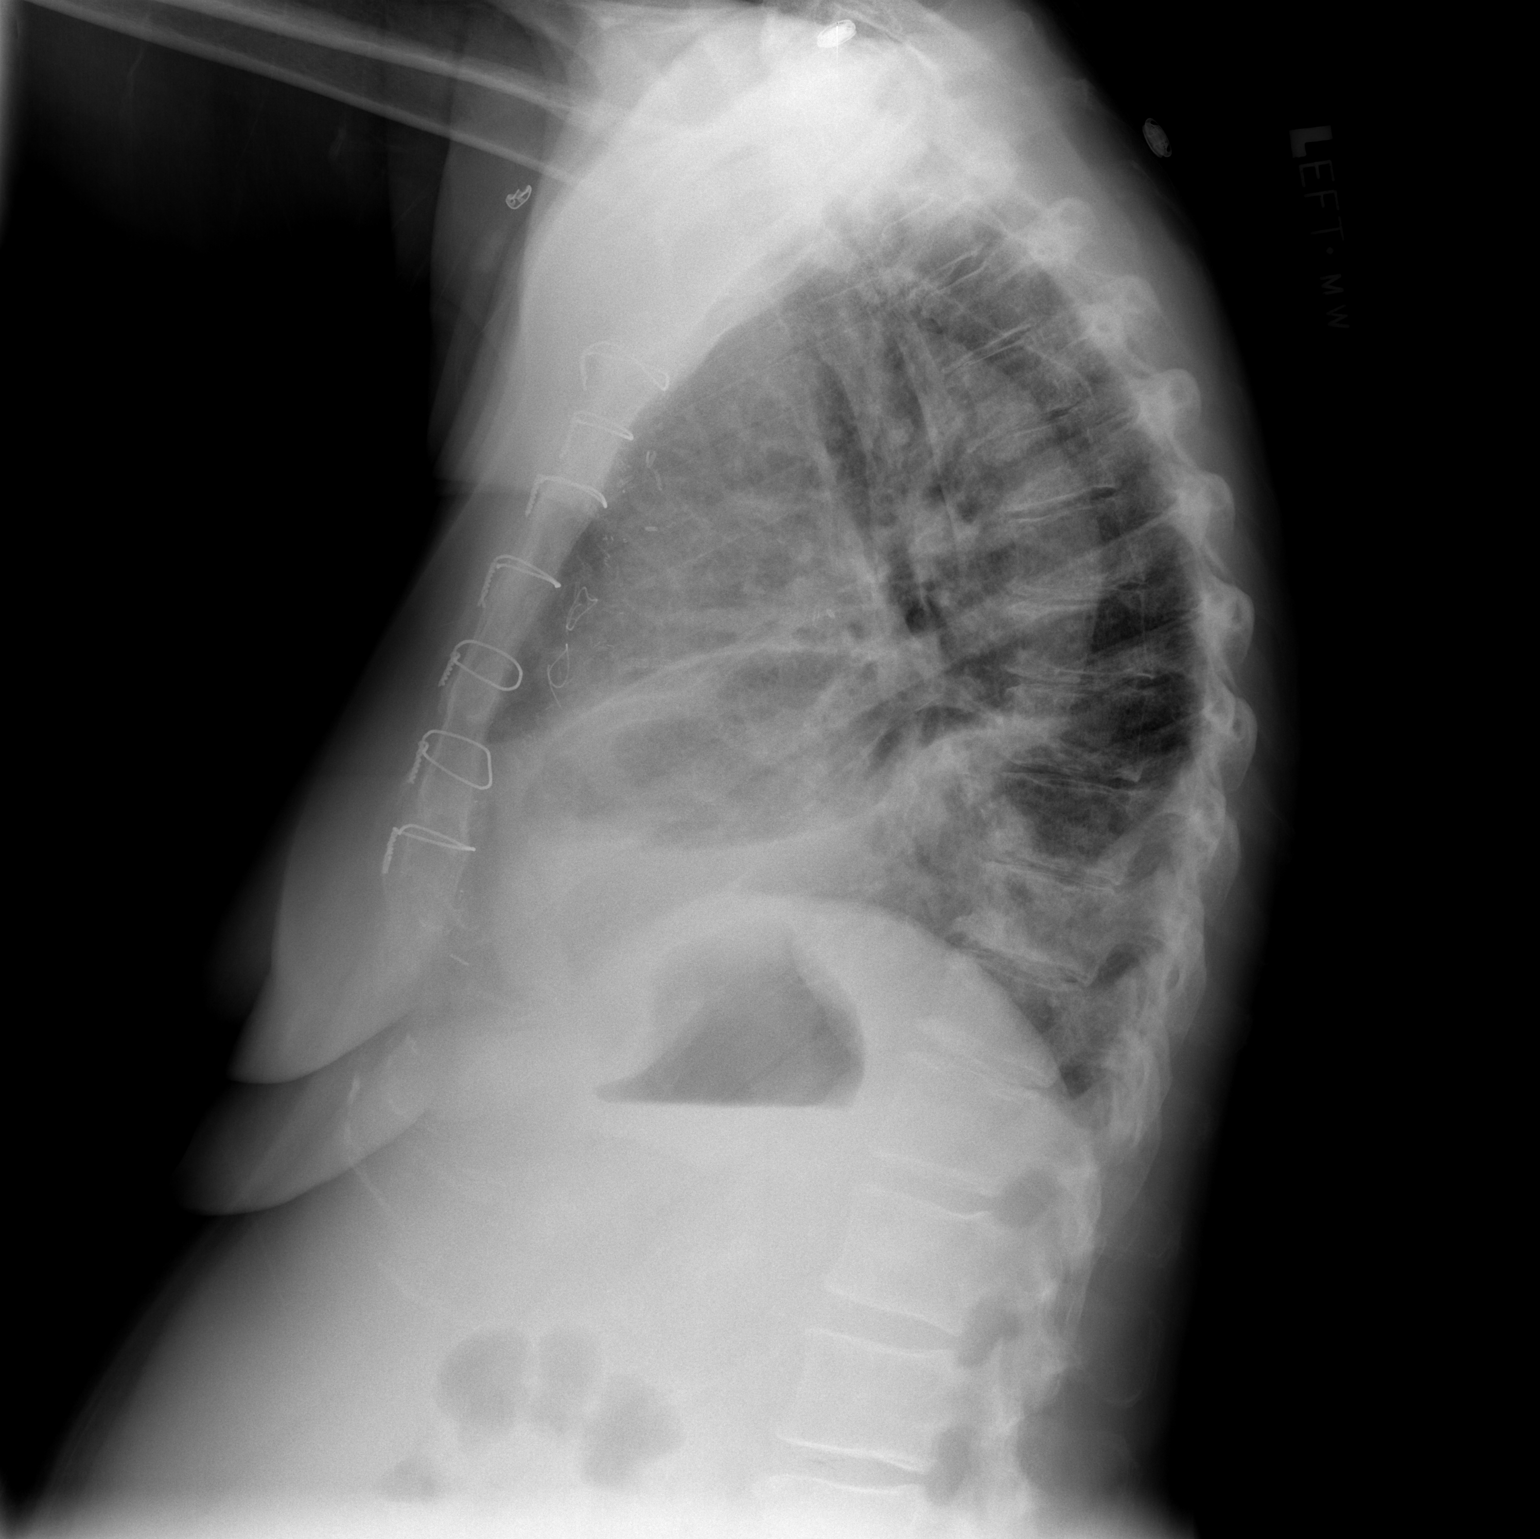

[2 of 2 positions shown; findings below may reference images not displayed]

FINDINGS: Persistent right effusion with right middle lower lobe
atelectasis / consolidation.  Left midlung atelectasis as well.
Heart is enlarged.  Previous coronary bypass changes noted.  No
pneumothorax.  Stable exam.
IMPRESSION: Stable right effusion with right middle lower lobe atelectasis /
consolidation.
Persistent left midlung atelectasis.

## 2013-01-06 ENCOUNTER — Ambulatory Visit: Payer: Medicare Other | Admitting: Cardiology

## 2013-01-20 ENCOUNTER — Encounter: Payer: Self-pay | Admitting: Cardiovascular Disease

## 2013-01-20 ENCOUNTER — Encounter: Payer: Self-pay | Admitting: Cardiology

## 2013-01-20 ENCOUNTER — Ambulatory Visit (INDEPENDENT_AMBULATORY_CARE_PROVIDER_SITE_OTHER): Payer: Medicare Other | Admitting: Cardiology

## 2013-01-20 VITALS — BP 124/72 | HR 58 | Ht 68.5 in | Wt 195.0 lb

## 2013-01-20 DIAGNOSIS — I25810 Atherosclerosis of coronary artery bypass graft(s) without angina pectoris: Secondary | ICD-10-CM

## 2013-01-20 DIAGNOSIS — I2510 Atherosclerotic heart disease of native coronary artery without angina pectoris: Secondary | ICD-10-CM

## 2013-01-20 DIAGNOSIS — I10 Essential (primary) hypertension: Secondary | ICD-10-CM

## 2013-01-20 DIAGNOSIS — I739 Peripheral vascular disease, unspecified: Secondary | ICD-10-CM

## 2013-01-20 DIAGNOSIS — J40 Bronchitis, not specified as acute or chronic: Secondary | ICD-10-CM

## 2013-01-20 NOTE — Progress Notes (Signed)
01/20/2013 Lindsay Nguyen   12-18-1935  [id number]  Primary Physicia Pcp Not In System Primary Cardiologist: Dr Allyson Sabal  HPI:  77 y/o with a history of LAD PCI 8/11. She had early ISR and also progression 11/11. She was noted to have a dilated thoracic aorta as well. She ultimatly had had CABG X 3 with LIMA-LAD, SVG-Dx, SVG-OM with thoracic aorta grafting. She had a long post op course but ultimatly recovered. Echo and Myoview in April 2012 looked good. She is here now for 6 month follow up. She denies angina or unusual SOB. She says she has not had syncope or near syncope. She says her energy level is generally good. She has had recent bronchitis and was told she may have a component of "reactive airway" disease. Her EKG shows her to be bradycardiac with first degree AVB but she seems to be asymptomatic.   Current Outpatient Prescriptions  Medication Sig Dispense Refill  . Ascorbic Acid (VITAMIN C) 1000 MG tablet Take 1,000 mg by mouth daily.        Marland Kitchen aspirin 81 MG tablet Take 81 mg by mouth daily.        Marland Kitchen atorvastatin (LIPITOR) 20 MG tablet Take 10 mg by mouth daily.       . carvedilol (COREG) 12.5 MG tablet Take 1.5 tabs in the morning, and 1 tab in PM       . Cholecalciferol (VITAMIN D3) 2000 UNITS TABS Take by mouth daily.        Marland Kitchen escitalopram (LEXAPRO) 5 MG tablet Take 10 mg by mouth daily.      . folic acid (FOLVITE) 1 MG tablet Take 1 mg by mouth daily.        . furosemide (LASIX) 40 MG tablet TAKE 1 TABLET DAILY  90 tablet  2  . iron polysaccharides (FERREX 150) 150 MG capsule Take 1 capsule (150 mg total) by mouth daily.  90 capsule  3  . levalbuterol (XOPENEX HFA) 45 MCG/ACT inhaler Inhale 1-2 puffs into the lungs every 4 (four) hours as needed.        Marland Kitchen levothyroxine (SYNTHROID, LEVOTHROID) 112 MCG tablet 1/2 tab daily, BRAND only per Dr [name] Never  45 tablet  3  . metFORMIN (GLUCOPHAGE) 1000 MG tablet Take 1,000 mg by mouth 2 (two) times daily with a meal.        . montelukast  (SINGULAIR) 10 MG tablet Take 10 mg by mouth at bedtime.      . nitroGLYCERIN (NITROSTAT) 0.4 MG SL tablet Place 0.4 mg under the tongue every 5 (five) minutes as needed.        . saxagliptin HCl (ONGLYZA) 5 MG TABS tablet Take 5 mg by mouth daily.        . valsartan (DIOVAN) 160 MG tablet Take 1 tablet (160 mg total) by mouth daily.  90 tablet  3  . ALPRAZolam (XANAX) 0.5 MG tablet Take 1 tablet (0.5 mg total) by mouth at bedtime as needed.  90 tablet  1  . amLODipine (NORVASC) 5 MG tablet Take 2.5 mg by mouth daily.        . potassium chloride SA (K-DUR,KLOR-CON) 20 MEQ tablet Take 1 tablet (20 mEq total) by mouth daily.  90 tablet  3   No current facility-administered medications for this visit.    Allergies  Allergen Reactions  . Iodine Swelling    Throat  . Contrast Media (Iodinated Diagnostic Agents)   . Gabapentin Hives  . Shellfish Allergy  History   Social History  . Marital Status: Married    Spouse Name: N/A    Number of Children: N/A  . Years of Education: N/A   Occupational History  . Not on file.   Social History Main Topics  . Smoking status: Never Smoker   . Smokeless tobacco: Not on file  . Alcohol Use: Not on file  . Drug Use: Not on file  . Sexually Active: Not on file   Other Topics Concern  . Not on file   Social History Narrative  . No narrative on file     Review of Systems: General: negative for chills, fever, night sweats or weight changes.  Cardiovascular: negative for chest pain, dyspnea on exertion, edema, orthopnea, palpitations, paroxysmal nocturnal dyspnea or shortness of breath Dermatological: negative for rash Respiratory: negative for cough or wheezing Urologic: negative for hematuria Abdominal: negative for nausea, vomiting, diarrhea, bright red blood per rectum, melena, or hematemesis Neurologic: negative for visual changes, syncope, or dizziness All other systems reviewed and are otherwise negative except as noted  above.    Blood pressure 124/72, pulse 58, height 5' 8.5" (1.74 m), weight 195 lb (88.451 kg).  General appearance: alert, cooperative and no distress Neck: no carotid bruit and no JVD Lungs: clear to auscultation bilaterally Heart: regular rate and rhythm and 2/6 systolic murmur LSB Extremities: no edema  EKG  EKG: normal EKG, normal sinus rhythm, unchanged from previous tracings.  ASSESSMENT AND PLAN:   CAD- LAD PCI 11/11 with early ISR- S/P CABG X3 with thoracic AO repair 11/11. Myoview low risk 4/12 No angina  HYPERTENSION controlled  PVD (peripheral vascular disease)- repair of thoracic Aorta 11/11- Echo WNL 4/12 No unusual SOB  Bronchitis Primary following, she was told she some degree of "reactive airway" disease by PFTs.   PLAN  I spoke with the pt and her family. I suggested they call back if she developers any signs of ZCHF or of syncope or symptomatic bradycardia. If she has any wheezing or worsening respiratory issues we may need to change her Coreg to a more selective beta blocker. I have requested labs from her primary care. She will see Dr Allyson Sabal in 6 months.   Lindsay Nguyen KPA-C 01/20/2013 2:59 PM

## 2013-01-20 NOTE — Assessment & Plan Note (Signed)
No unusual SOB

## 2013-01-20 NOTE — Assessment & Plan Note (Signed)
controlled 

## 2013-01-20 NOTE — Assessment & Plan Note (Signed)
Primary following, she was told she some degree of "reactive airway" disease by PFTs.

## 2013-01-20 NOTE — Assessment & Plan Note (Signed)
No angina 

## 2013-01-20 NOTE — Patient Instructions (Signed)
Same medications for now. If bronchitis doesn't clear or you start to develop more severe symptoms call back. See Dr Allyson Sabal in 6 months.  Corine Shelter PA-C 01/20/2013 2:50 PM

## 2013-07-12 ENCOUNTER — Ambulatory Visit: Payer: Medicare Other | Admitting: Cardiovascular Disease

## 2013-07-12 ENCOUNTER — Ambulatory Visit (INDEPENDENT_AMBULATORY_CARE_PROVIDER_SITE_OTHER): Payer: Medicare Other | Admitting: Cardiovascular Disease

## 2013-07-12 ENCOUNTER — Encounter: Payer: Self-pay | Admitting: Cardiovascular Disease

## 2013-07-12 VITALS — BP 154/62 | HR 70 | Ht 68.5 in | Wt 205.0 lb

## 2013-07-12 DIAGNOSIS — I2510 Atherosclerotic heart disease of native coronary artery without angina pectoris: Secondary | ICD-10-CM

## 2013-07-12 DIAGNOSIS — E785 Hyperlipidemia, unspecified: Secondary | ICD-10-CM

## 2013-07-12 DIAGNOSIS — I10 Essential (primary) hypertension: Secondary | ICD-10-CM

## 2013-07-12 NOTE — Progress Notes (Signed)
07/12/2013 Koleen Nimrod P Crisman   1936/01/15  [id number]  Primary Physician Pcp Not In System Primary Cardiologist: Runell Gess MD Roseanne Reno   HPI: The patient returns today for 22-month followup. She is a pleasant, 77 year old, mildly overweight, married Caucasian female, mother of 2, grandmother to 2 grandchildren accompanied by her daughter today who is a PA. I last saw her 6 months ago. Her risk factors include family history, hypertension, hyperlipidemia and noninsulin-requiring diabetes. She had a non-ST-segment-elevation myocardial infarction March 21, 2010, with peak troponin of 4. I brought her to the cath lab the following day and stented her proximal LAD with a bare-metal stent. She did have nonobstructive disease beyond the stent as well as a diagonal branch, AV groove circumflex with an EF of 45% and anteroapical akinesia. Because of a generous thoracic aorta on cath, I performed a CT angiogram on her revealing her thoracic aorta which measured 4.9 cm. She was seen by Dr. Evelene Croon for evaluation of this at my request. Myoview performed June 11, 2010, showed apical and inferior ischemia as well as anterior ischemia. I noted her June 17, 2010, with rapid onset dyspnea relieved with sublingual nitroglycerin. The patient ruled out for myocardial infarction and was heparinized. I ultimately cath'd her November 16 revealing aggressive "in-stent restenosis" within the proximal LAD stent, as well as in her diagonal branch and AV groove circumflex.   She ultimately underwent coronary artery bypass grafting by Dr. Evelene Croon with LIMA to her LAD, a vein to a diagonal branch, as well as obtuse marginal branch with resection and grafting of her thoracic aortic aneurysm using a 20-mm supracoronary Hemashield 2 graft utilizing deep hypothermic circulatory arrest with resuspension of her native aortic valve and reimplantation of her native coronary arteries. Her postop course  was complicated and prolonged, lasting 4 weeks. She did have atrial fibrillation, pneumonia, cholecystitis status post cholecystectomy, as well as pleural effusions. She had a percutaneous drain of her gallbladder and ultimately cholecystectomy by Dr. Johna Sheriff September 18, 2010. She recuperated nicely. She had a Myoview performed November 04, 2010, which was nonischemic and a 2D echo that showed normal LV systolic function with a normal functioning aortic valve.  She saw Franky Macho to a [facility] in the office 6 months ago and has been doing well since here she is totally asymptomatic. Her primary care physician Dr. Allena Katz follows her lipid profile.    Current Outpatient Prescriptions  Medication Sig Dispense Refill  . amLODipine (NORVASC) 5 MG tablet Take 2.5 mg by mouth daily.        . Ascorbic Acid (VITAMIN C) 1000 MG tablet Take 1,000 mg by mouth daily.        Marland Kitchen aspirin 81 MG tablet Take 81 mg by mouth daily.        Marland Kitchen atorvastatin (LIPITOR) 20 MG tablet Take 10 mg by mouth daily.       . carvedilol (COREG) 12.5 MG tablet Take 1.5 tabs in the morning, and 1 tab in PM       . Cholecalciferol (VITAMIN D3) 2000 UNITS TABS Take by mouth daily.        Marland Kitchen escitalopram (LEXAPRO) 5 MG tablet Take 10 mg by mouth daily.      . furosemide (LASIX) 40 MG tablet TAKE 1 TABLET DAILY  90 tablet  2  . levalbuterol (XOPENEX HFA) 45 MCG/ACT inhaler Inhale 1-2 puffs into the lungs every 4 (four) hours as needed.        Marland Kitchen  levothyroxine (SYNTHROID, LEVOTHROID) 112 MCG tablet 1/2 tab daily, BRAND only per Dr [name] Never  45 tablet  3  . metFORMIN (GLUCOPHAGE) 1000 MG tablet Take 1,000 mg by mouth 2 (two) times daily with a meal.        . montelukast (SINGULAIR) 10 MG tablet Take 10 mg by mouth at bedtime.      . nitroGLYCERIN (NITROSTAT) 0.4 MG SL tablet Place 0.4 mg under the tongue every 5 (five) minutes as needed.        . saxagliptin HCl (ONGLYZA) 5 MG TABS tablet Take 5 mg by mouth daily.        . traZODone (DESYREL) 100 MG  tablet Take 100 mg by mouth at bedtime.      . valsartan (DIOVAN) 160 MG tablet Take 1 tablet (160 mg total) by mouth daily.  90 tablet  3  . folic acid (FOLVITE) 1 MG tablet Take 1 mg by mouth daily.         No current facility-administered medications for this visit.    Allergies  Allergen Reactions  . Iodine Swelling    Throat  . Contrast Media [Iodinated Diagnostic Agents]   . Gabapentin Hives  . Shellfish Allergy     History   Social History  . Marital Status: Married    Spouse Name: N/A    Number of Children: N/A  . Years of Education: N/A   Occupational History  . Not on file.   Social History Main Topics  . Smoking status: Never Smoker   . Smokeless tobacco: Not on file  . Alcohol Use: Not on file  . Drug Use: Not on file  . Sexual Activity: Not on file   Other Topics Concern  . Not on file   Social History Narrative  . No narrative on file     Review of Systems: General: negative for chills, fever, night sweats or weight changes.  Cardiovascular: negative for chest pain, dyspnea on exertion, edema, orthopnea, palpitations, paroxysmal nocturnal dyspnea or shortness of breath Dermatological: negative for rash Respiratory: negative for cough or wheezing Urologic: negative for hematuria Abdominal: negative for nausea, vomiting, diarrhea, bright red blood per rectum, melena, or hematemesis Neurologic: negative for visual changes, syncope, or dizziness All other systems reviewed and are otherwise negative except as noted above.    Blood pressure 154/62, pulse 70, height 5' 8.5" (1.74 m), weight 205 lb (92.987 kg).  General appearance: alert and no distress Neck: no adenopathy, no carotid bruit, no JVD, supple, symmetrical, trachea midline and thyroid not enlarged, symmetric, no tenderness/mass/nodules Lungs: clear to auscultation bilaterally Heart: soft outflow tract murmur Extremities: extremities normal, atraumatic, no cyanosis or edema  EKG normal  sinus rhythm at 70 with a nonspecific IVCD  ASSESSMENT AND PLAN:   CAD- LAD PCI 08/11 with early ISR- S/P CABG X3 with thoracic AO repair 11/11. Myoview low risk 4/12 Status post stenting of her LAD in the past for known thoracic aortic aneurysm. She ultimately underwent coronary artery bypass grafting Dr. Rexanne Mano with a LIMA to LAD, vein to diagonal branch, obtuse marginal branch as well as resection and grafting of her thoracic aortic aneurysm. She had resuspension of her native aorta itself with reimplantation of her coronary arteries.her last Myoview performed 11/04/10 was nonischemic. She denies chest pain or shortness of breath.  DYSLIPIDEMIA On statin drugs followed by her PCP  HYPERTENSION Well-controlled on current medications      Runell Gess MD [facility], [facility] 07/12/2013 1:59 PM

## 2013-07-12 NOTE — Patient Instructions (Signed)
Dr Allyson Sabal has requested that you have a lexiscan myoview in April/May 2015. For further information please visit [email or web address] Please follow instruction sheet, as given.  Dr. Allyson Sabal wants you to follow-up in 6 months with Corine Shelter, PA-C.              12 months with Dr Allyson Sabal. You will receive a reminder letter in the mail two months in advance. If you don't receive a letter, please call our office to schedule the follow-up appointment.

## 2013-07-12 NOTE — Assessment & Plan Note (Signed)
Status post stenting of her LAD in the past for known thoracic aortic aneurysm. She ultimately underwent coronary artery bypass grafting Dr. Rexanne Mano with a LIMA to LAD, vein to diagonal branch, obtuse marginal branch as well as resection and grafting of her thoracic aortic aneurysm. She had resuspension of her native aorta itself with reimplantation of her coronary arteries.her last Myoview performed 11/04/10 was nonischemic. She denies chest pain or shortness of breath.

## 2013-07-12 NOTE — Assessment & Plan Note (Signed)
Well-controlled on current medications 

## 2013-07-12 NOTE — Assessment & Plan Note (Signed)
On statin drugs followed by her PCP

## 2013-07-13 ENCOUNTER — Encounter: Payer: Self-pay | Admitting: Cardiovascular Disease

## 2013-10-02 ENCOUNTER — Telehealth: Payer: Self-pay | Admitting: Hematology and Oncology

## 2013-10-02 NOTE — Telephone Encounter (Signed)
S/W PT IT REF TO NP APPT.ON 10/12/13@12 :00 REFERRING DR PATEL DX-HEMOCHROMATOSIS MAILED NP PACKET

## 2013-10-03 ENCOUNTER — Telehealth: Payer: Self-pay | Admitting: Hematology and Oncology

## 2013-10-03 NOTE — Telephone Encounter (Signed)
C/D 10/03/13 for appt. 10/12/13

## 2013-10-12 ENCOUNTER — Ambulatory Visit: Payer: Medicare Other

## 2013-10-12 ENCOUNTER — Encounter: Payer: Self-pay | Admitting: Hematology and Oncology

## 2013-10-12 ENCOUNTER — Ambulatory Visit (HOSPITAL_BASED_OUTPATIENT_CLINIC_OR_DEPARTMENT_OTHER): Payer: Medicare Other | Admitting: Hematology and Oncology

## 2013-10-12 DIAGNOSIS — E119 Type 2 diabetes mellitus without complications: Secondary | ICD-10-CM

## 2013-10-12 NOTE — Progress Notes (Signed)
Checked in new pt with no financial concerns. °

## 2013-10-12 NOTE — Progress Notes (Signed)
[facility] CONSULT NOTE  Patient Care Team: Pcp Not In System as PCP - General Edward Jolly, MD as Consulting Physician (General Surgery) [name], MD as Consulting Physician (Cardiology) Gaye Pollack, MD as Consulting Physician (Cardiothoracic Surgery) Cleotis Nipper, MD as Consulting Physician (Gastroenterology) Brand Males, MD as Consulting Physician (Pulmonary Disease) Joyice Faster, MD as Attending Physician Heath Lark, MD as Consulting Physician (Hematology and Oncology)  CHIEF COMPLAINTS/PURPOSE OF CONSULTATION:  Hemochromatosis, homozygous for C282Y  HISTORY OF PRESENTING ILLNESS:  Lindsay Nguyen 78 y.o. female is here because of recent diagnosis of hemochromatosis. According to the patient, his brother recently was found to have iron overload with ferritin over 1000. The patient was subsequently tested and was found to have to copies of C282Y mutation. Iron studies revealed a normal ferritin of 47.5 and CBC showed hemoglobin of 13.7 and liver function tests were within normal limits.  MEDICAL HISTORY:  Past Medical History  Diagnosis Date  . Anemia   . Diabetes mellitus   . Hyperlipidemia   . Hypertension   . Thyroid disease     hypothyroid  . GERD (gastroesophageal reflux disease)   . Osteopenia   . Pain in limb     LEA VENOUS DUPLEX, 06/27/2010 - no evidence of DVT  . Non-STEMI (non-ST elevated myocardial infarction)     NUCLEAR STRESS TEST, 11/04/2010 - normal, no ischemia  . CAD (coronary artery disease)     2D ECHO, 11/04/2010 - EF >55%, mild-moderate mitral regurgitation, moderate tricuspid regurgitation  . Thoracic aortic aneurysm     status post resection and grafting    SURGICAL HISTORY: Past Surgical History  Procedure Laterality Date  . Tubal ligation  1971  . Tonsillectomy  1945  . Elbow surgery  1979    fracture left  . Cardiac catheterization  06/18/2010    Recommended CABG  . Coronary artery bypass graft   06/18/2010    LIMA-LAD, vein-diagonal branch, vein-obtuse marginal branch, and resectioning and grafting of throacic aortic aneurysm  . Cholecystectomy      SOCIAL HISTORY: History   Social History  . Marital Status: Married    Spouse Name: N/A    Number of Children: N/A  . Years of Education: N/A   Occupational History  . Not on file.   Social History Main Topics  . Smoking status: Never Smoker   . Smokeless tobacco: Never Used  . Alcohol Use: No  . Drug Use: No  . Sexual Activity: Not on file   Other Topics Concern  . Not on file   Social History Narrative  . No narrative on file    FAMILY HISTORY: Family History  Problem Relation Age of Onset  . Hypertension Mother   . Osteoporosis Mother   . Heart disease Father   . Stroke Father   . Alzheimer's disease Father     ALLERGIES:  is allergic to iodine; contrast media; gabapentin; and shellfish allergy.  MEDICATIONS:  Current Outpatient Prescriptions  Medication Sig Dispense Refill  . alendronate (FOSAMAX) 70 MG tablet Take 70 mg by mouth once a week. Take with a full glass of water on an empty stomach.      Marland Kitchen amLODipine (NORVASC) 2.5 MG tablet Take 2.5 mg by mouth daily.      Marland Kitchen aspirin 325 MG tablet Take 325 mg by mouth daily.      Marland Kitchen atorvastatin (LIPITOR) 20 MG tablet Take 10 mg by mouth daily.       Marland Kitchen  budesonide (PULMICORT) 180 MCG/ACT inhaler Inhale 2 puffs into the lungs 2 (two) times daily.      . carvedilol (COREG) 12.5 MG tablet Take 1.5 tabs in the morning, and 1 tab in PM       . Cholecalciferol (VITAMIN D3) 2000 UNITS TABS Take by mouth daily.        Marland Kitchen escitalopram (LEXAPRO) 5 MG tablet Take 10 mg by mouth daily.      . furosemide (LASIX) 40 MG tablet TAKE 1 TABLET DAILY  90 tablet  2  . levalbuterol (XOPENEX HFA) 45 MCG/ACT inhaler Inhale 1-2 puffs into the lungs every 4 (four) hours as needed.        Marland Kitchen levothyroxine (SYNTHROID, LEVOTHROID) 112 MCG tablet 1/2 tab daily, BRAND only per Dr palel       . metFORMIN (GLUCOPHAGE) 1000 MG tablet Take 1,000 mg by mouth 2 (two) times daily with a meal.        . naproxen sodium (ANAPROX) 220 MG tablet Take 220 mg by mouth 2 (two) times daily with a meal.      . nitroGLYCERIN (NITROSTAT) 0.4 MG SL tablet Place 0.4 mg under the tongue every 5 (five) minutes as needed.        . saxagliptin HCl (ONGLYZA) 5 MG TABS tablet Take 5 mg by mouth daily.        . traZODone (DESYREL) 100 MG tablet Take 100 mg by mouth at bedtime.      . valsartan (DIOVAN) 160 MG tablet Take 1 tablet (160 mg total) by mouth daily.  90 tablet  3  . amLODipine (NORVASC) 5 MG tablet Take 2.5 mg by mouth daily.        . montelukast (SINGULAIR) 10 MG tablet Take 10 mg by mouth at bedtime.       No current facility-administered medications for this visit.    REVIEW OF SYSTEMS:   Constitutional: Denies fevers, chills or abnormal night sweats Eyes: Denies blurriness of vision, double vision or watery eyes Ears, nose, mouth, throat, and face: Denies mucositis or sore throat Respiratory: Denies cough, dyspnea or wheezes Cardiovascular: Denies palpitation, chest discomfort or lower extremity swelling Gastrointestinal:  Denies nausea, heartburn or change in bowel habits Skin: Denies abnormal skin rashes Lymphatics: Denies new lymphadenopathy or easy bruising Neurological:Denies numbness, tingling or new weaknesses Behavioral/Psych: Mood is stable, no new changes  All other systems were reviewed with the patient and are negative.  PHYSICAL EXAMINATION: ECOG PERFORMANCE STATUS: 0 - Asymptomatic  Filed Vitals:   10/12/13 1218  BP: 123/59  Pulse: 70  Temp: 98.2 F (36.8 C)  Resp: 18   Filed Weights   10/12/13 1218  Weight: 204 lb 12.8 oz (92.897 kg)    GENERAL:alert, no distress and comfortable SKIN: skin color, texture, turgor are normal, no rashes or significant lesions EYES: normal, conjunctiva are pink and non-injected, sclera clear OROPHARYNX:no exudate, no erythema  and lips, buccal mucosa, and tongue normal  NECK: supple, thyroid normal size, non-tender, without nodularity LYMPH:  no palpable lymphadenopathy in the cervical, axillary or inguinal LUNGS: clear to auscultation and percussion with normal breathing effort HEART: regular rate & rhythm and no murmurs and no lower extremity edema ABDOMEN:abdomen soft, non-tender and normal bowel sounds Musculoskeletal:no cyanosis of digits and no clubbing  PSYCH: alert & oriented x 3 with fluent speech NEURO: no focal motor/sensory deficits  LABORATORY DATA:  I have reviewed the data as listed Lab Results  Component Value Date   WBC  8.3 10/03/2010   HGB 12.0 10/03/2010   HCT 35.3* 10/03/2010   MCV 86.0 10/03/2010   PLT 250.0 10/03/2010   Lab Results  Component Value Date   NA 138 10/03/2010   K 4.4 10/03/2010   CL 100 10/03/2010   CO2 29 10/03/2010   ASSESSMENT & PLAN:  #1 homozygous for hemachromatosis #2 diabetes The patient has no evidence of iron overload. 3 years ago, the patient was very sick requiring major surgery and blood transfusions. I am wondering whether she may have iron overload in the past that predispose her to get diabetes.  Then she had significant major surgery and anemia requiring blood transfusion and that may have depleted her iron store. In any case, the patient has no evidence of iron overload now.  I recommend a yearly ferritin level to be done by her primary care physician. I do not recommend phlebotomy unless her ferritin level is greater than 500 or there is presence of organ damage such as liver dysfunction. All her children would be carriers. I do not advocate a screening for hemachromatosis in her children as it would not impact that lifestyle. Most carriers would not manifest syndrome of iron overload. I have not make a return appointment for the patient to come back.    All questions were answered. The patient knows to call the clinic with any problems, questions or  concerns. I spent 40 minutes counseling the patient face to face. The total time spent in the appointment was 55 minutes and more than 50% was on counseling.     Madoline Bhatt, MD 10/12/2013 1:04 PM

## 2013-12-19 ENCOUNTER — Other Ambulatory Visit (HOSPITAL_COMMUNITY): Payer: Self-pay | Admitting: *Deleted

## 2013-12-20 ENCOUNTER — Other Ambulatory Visit (HOSPITAL_COMMUNITY): Payer: Self-pay | Admitting: Internal Medicine

## 2013-12-20 DIAGNOSIS — I10 Essential (primary) hypertension: Secondary | ICD-10-CM

## 2013-12-20 DIAGNOSIS — E119 Type 2 diabetes mellitus without complications: Secondary | ICD-10-CM

## 2013-12-20 DIAGNOSIS — R809 Proteinuria, unspecified: Secondary | ICD-10-CM

## 2013-12-26 ENCOUNTER — Other Ambulatory Visit: Payer: Self-pay

## 2013-12-26 MED ORDER — CARVEDILOL 12.5 MG PO TABS: 18.7500 mg | ORAL_TABLET | Freq: Two times a day (BID) | ORAL | Status: DC

## 2013-12-26 NOTE — Telephone Encounter (Signed)
Rx was sent to pharmacy electronically. 

## 2013-12-27 ENCOUNTER — Telehealth: Payer: Self-pay | Admitting: Cardiovascular Disease

## 2013-12-27 MED ORDER — CARVEDILOL 12.5 MG PO TABS: ORAL_TABLET | ORAL | Status: DC

## 2013-12-27 NOTE — Telephone Encounter (Signed)
Daughter would like for you to call her.Pt is suppose to have a stress test before her next office visit.Pt now have bronchitis and daughter says she need to discuss some things with you.

## 2013-12-27 NOTE — Telephone Encounter (Signed)
Daughter, Santiago Glad (NP) called.  Mother was seen at [facility] yesterday for Bronchitis.  CXR showed cardiomegaly.  Started on Prednisone doses pak and Tessalon Perles and told to start zithromax if congestion does not clear up.  Santiago Glad wants to know should her mother still have the Nuc study (which hasn't been scheduled yet) and/or echo and should she take zithromax since it can cause QT longation.  Will send message to [place], [place] and get his advise.

## 2013-12-27 NOTE — Telephone Encounter (Signed)
Daughter would like for Her st you to call her. Pt is suppose to have a stress test before her next appt. Her stress test have not been scheduled. Pt now have bronchitis,the daughter wants to talk to you about that and some other things please.

## 2013-12-27 NOTE — Addendum Note (Signed)
Addended by: Diana Eves on: 12/27/2013 11:37 AM   Modules accepted: Orders

## 2013-12-29 ENCOUNTER — Ambulatory Visit (HOSPITAL_COMMUNITY)
Admission: RE | Admit: 2013-12-29 | Discharge: 2013-12-29 | Disposition: A | Discharge status: Home / Self Care | Payer: Medicare Other | Source: Ambulatory Visit | Attending: Internal Medicine | Admitting: Internal Medicine

## 2013-12-29 DIAGNOSIS — I10 Essential (primary) hypertension: Secondary | ICD-10-CM

## 2013-12-29 DIAGNOSIS — E119 Type 2 diabetes mellitus without complications: Secondary | ICD-10-CM

## 2013-12-29 DIAGNOSIS — R809 Proteinuria, unspecified: Secondary | ICD-10-CM

## 2013-12-31 NOTE — Telephone Encounter (Signed)
OK to take Z-Pack. Bronchitis shouldn't interfere with stress test  JJB

## 2014-01-01 NOTE — Telephone Encounter (Signed)
Left message on daughter's, Santiago Glad, voicemail informing her of information per Dr. Gwenlyn Found.

## 2014-01-02 ENCOUNTER — Telehealth: Payer: Self-pay | Admitting: Cardiovascular Disease

## 2014-01-02 MED ORDER — CARVEDILOL 12.5 MG PO TABS: ORAL_TABLET | ORAL | Status: DC

## 2014-01-02 NOTE — Telephone Encounter (Signed)
E -SCRIBED  PRESCRIPTION TO [facility]  Called and spoke Maudie Mercury cancelled medication order to [facility]   Patient is aware.

## 2014-01-02 NOTE — Telephone Encounter (Signed)
Said coreg was never called in to her pharmacy--[facility]  Please call

## 2014-01-11 ENCOUNTER — Encounter: Payer: Self-pay | Admitting: Cardiology

## 2014-01-11 ENCOUNTER — Ambulatory Visit (INDEPENDENT_AMBULATORY_CARE_PROVIDER_SITE_OTHER): Payer: Medicare Other | Admitting: Cardiology

## 2014-01-11 VITALS — BP 122/68 | HR 69 | Ht 69.0 in | Wt 201.0 lb

## 2014-01-11 DIAGNOSIS — I517 Cardiomegaly: Secondary | ICD-10-CM

## 2014-01-11 DIAGNOSIS — I10 Essential (primary) hypertension: Secondary | ICD-10-CM

## 2014-01-11 DIAGNOSIS — I429 Cardiomyopathy, unspecified: Secondary | ICD-10-CM

## 2014-01-11 DIAGNOSIS — I2510 Atherosclerotic heart disease of native coronary artery without angina pectoris: Secondary | ICD-10-CM

## 2014-01-11 DIAGNOSIS — I428 Other cardiomyopathies: Secondary | ICD-10-CM

## 2014-01-11 DIAGNOSIS — J40 Bronchitis, not specified as acute or chronic: Secondary | ICD-10-CM

## 2014-01-11 NOTE — Assessment & Plan Note (Signed)
April 2015- Rx'd with steroids

## 2014-01-11 NOTE — Assessment & Plan Note (Signed)
Noted on CXR at Urgent Care in April 2015

## 2014-01-11 NOTE — Patient Instructions (Signed)
1. Have your chest xray done as soon as you can if not today  2.We will schedule you for an echo today  3.Your physician recommends that you schedule a follow-up appointment in: 6 months with Dr. Gwenlyn Found

## 2014-01-11 NOTE — Assessment & Plan Note (Signed)
Controlled.  

## 2014-01-11 NOTE — Progress Notes (Signed)
01/11/2014 Lindsay Nguyen   10-21-1935  [id number]  Primary Physicia PATEL, Lindsay Bill, MD Primary Cardiologist: Dr Gwenlyn Found  HPI:  78 y/o followed by Dr Gwenlyn Found. Her son Lindsay Nguyen was an Therapist, sports at [employer] and her daughter is an NP.  a history of LAD PCI 8/11. She had early ISR and also progression 11/11. She was noted to have a dilated thoracic aorta as well. She ultimatly had had CABG X 3 with LIMA-LAD, SVG-Dx, SVG-OM with thoracic aorta grafting. She had a long post op course but ultimatly recovered. Echo and Myoview in April 2012 looked good. She is here now for 6 month follow up. She denies angina or unusual SOB. She says she has not had syncope or near syncope. She has had recent bronchitis and was told she may have a component of "reactive airway" disease. A CXR done at [facility] apparently showed "cardiomegaly". Her children wanted to know if this needed further evaluation. Her EKG shows her to be bradycardiac with first degree AVB but she seems to be asymptomatic. She does not have wheezing or unusual SOB.     Current Outpatient Prescriptions  Medication Sig Dispense Refill  . alendronate (FOSAMAX) 70 MG tablet Take 70 mg by mouth once a week. Take with a full glass of water on an empty stomach.      Marland Kitchen amLODipine (NORVASC) 2.5 MG tablet Take 2.5 mg by mouth daily.      Marland Kitchen aspirin 325 MG tablet Take 325 mg by mouth daily.      Marland Kitchen atorvastatin (LIPITOR) 20 MG tablet Take 10 mg by mouth daily.       . budesonide (PULMICORT) 180 MCG/ACT inhaler Inhale 2 puffs into the lungs 2 (two) times daily.      . carvedilol (COREG) 12.5 MG tablet Take 1.5 tablets (18.75 mg total) by mouth in the morning and take 1 tablet (12.5 mg total) by mouth in the evening.  135 tablet  2  . Cholecalciferol (VITAMIN D3) 2000 UNITS TABS Take by mouth daily.        Marland Kitchen escitalopram (LEXAPRO) 5 MG tablet Take 10 mg by mouth daily.      . furosemide (LASIX) 40 MG tablet TAKE 1 TABLET DAILY  90 tablet  2  . levalbuterol  (XOPENEX HFA) 45 MCG/ACT inhaler Inhale 1-2 puffs into the lungs every 4 (four) hours as needed.        Marland Kitchen levothyroxine (SYNTHROID, LEVOTHROID) 112 MCG tablet 1/2 tab daily, BRAND only per Dr palel      . metFORMIN (GLUCOPHAGE) 1000 MG tablet Take 1,000 mg by mouth 2 (two) times daily with a meal.        . montelukast (SINGULAIR) 10 MG tablet Take 10 mg by mouth at bedtime.      . naproxen sodium (ANAPROX) 220 MG tablet Take 220 mg by mouth 2 (two) times daily with a meal.      . nitroGLYCERIN (NITROSTAT) 0.4 MG SL tablet Place 0.4 mg under the tongue every 5 (five) minutes as needed.        . saxagliptin HCl (ONGLYZA) 5 MG TABS tablet Take 5 mg by mouth daily.        . traZODone (DESYREL) 100 MG tablet Take 100 mg by mouth at bedtime.      . valsartan (DIOVAN) 160 MG tablet Take 1 tablet (160 mg total) by mouth daily.  90 tablet  3  . amLODipine (NORVASC) 5 MG tablet Take 2.5  mg by mouth daily.         No current facility-administered medications for this visit.    Allergies  Allergen Reactions  . Iodine Swelling    Throat  . Contrast Media [Iodinated Diagnostic Agents]   . Gabapentin Hives  . Shellfish Allergy     History   Social History  . Marital Status: Married    Spouse Name: N/A    Number of Children: N/A  . Years of Education: N/A   Occupational History  . Not on file.   Social History Main Topics  . Smoking status: Never Smoker   . Smokeless tobacco: Never Used  . Alcohol Use: No  . Drug Use: No  . Sexual Activity: Not on file   Other Topics Concern  . Not on file   Social History Narrative  . No narrative on file     Review of Systems: General: negative for chills, fever, night sweats or weight changes.  Cardiovascular: negative for chest pain, dyspnea on exertion, edema, orthopnea, palpitations, paroxysmal nocturnal dyspnea or shortness of breath Dermatological: negative for rash Respiratory: negative for cough or wheezing Urologic: negative for  hematuria Abdominal: negative for nausea, vomiting, diarrhea, bright red blood per rectum, melena, or hematemesis Neurologic: negative for visual changes, syncope, or dizziness All other systems reviewed and are otherwise negative except as noted above.    Blood pressure 122/68, pulse 69, height 5\' 9"  (1.753 m), weight 201 lb (91.173 kg).  General appearance: alert, cooperative and no distress Neck: no carotid bruit and no JVD Lungs: clear to auscultation bilaterally Heart: regular rate and rhythm Extremities: no edema, 2+ pulses on Rt, diminnished on Lt  EKG NSR, !st degree AVB, LVH  ASSESSMENT AND PLAN:   Cardiomyopathy- noted on recent CXR Noted on CXR at Urgent Care in April 2015  Bronchitis April 2015- Rx'd with steroids  CAD- LAD PCI 08/11 with early ISR- S/P CABG X3 with thoracic AO repair 11/11. Myoview low risk 4/12 .  HYPERTENSION Controlled   PLAN  I ordered an echo and CXR. She has no CHF symptoms but cardiomegaly by CXR should be followed up. She'll follow up with Dr Gwenlyn Found in 6 months or sooner if CXR and echo indicate. We refilled her Coreg, in the future she would like a 6 month supply. I called her son Lindsay Nguyen who I know from his days at [employer].   [name] KPA-C 01/11/2014 2:17 PM

## 2014-01-24 ENCOUNTER — Ambulatory Visit
Admission: RE | Admit: 2014-01-24 | Discharge: 2014-01-24 | Disposition: A | Discharge status: Home / Self Care | Payer: Medicare Other | Source: Ambulatory Visit | Attending: Cardiology | Admitting: Cardiology

## 2014-01-24 DIAGNOSIS — I517 Cardiomegaly: Secondary | ICD-10-CM

## 2014-01-25 ENCOUNTER — Ambulatory Visit (HOSPITAL_COMMUNITY): Payer: Medicare Other

## 2014-02-01 ENCOUNTER — Ambulatory Visit (HOSPITAL_COMMUNITY)
Admission: RE | Admit: 2014-02-01 | Discharge: 2014-02-01 | Disposition: A | Discharge status: Home / Self Care | Payer: Medicare Other | Source: Ambulatory Visit | Attending: Cardiology | Admitting: Cardiology

## 2014-02-01 DIAGNOSIS — I517 Cardiomegaly: Secondary | ICD-10-CM | POA: Insufficient documentation

## 2014-02-01 DIAGNOSIS — I369 Nonrheumatic tricuspid valve disorder, unspecified: Secondary | ICD-10-CM

## 2014-02-01 NOTE — Progress Notes (Signed)
2D Echocardiogram Complete.  02/01/2014   Francelia Mclaren, RDCS

## 2014-04-04 ENCOUNTER — Telehealth: Payer: Self-pay | Admitting: Cardiovascular Disease

## 2014-04-04 MED ORDER — AMOXICILLIN 500 MG PO CAPS: ORAL_CAPSULE | ORAL | Status: DC

## 2014-04-04 NOTE — Telephone Encounter (Signed)
Per Dr. Gwenlyn Found - patient should have SBE prophylaxis prior to dental work. Amoxicillin 2,000mg  sent to [facility] in [place]. Patient aware.

## 2014-04-04 NOTE — Telephone Encounter (Signed)
Pt called in stating that she will be going to the dentist to have a cap repaired and she would like to know if she needs to take any prophylactic antibiotics. Please call  Thanks

## 2014-06-11 ENCOUNTER — Telehealth: Payer: Self-pay | Admitting: Cardiovascular Disease

## 2014-06-11 MED ORDER — CARVEDILOL 12.5 MG PO TABS: ORAL_TABLET | ORAL | Status: DC

## 2014-06-11 NOTE — Telephone Encounter (Signed)
Pt need a new prescription for her Coreg 12.5 mg #90 and refill. Please call or send to [facility].

## 2014-06-11 NOTE — Telephone Encounter (Signed)
Rx refill sent to patient pharmacy   

## 2014-08-06 DIAGNOSIS — J209 Acute bronchitis, unspecified: Secondary | ICD-10-CM | POA: Diagnosis not present

## 2014-08-06 DIAGNOSIS — E780 Pure hypercholesterolemia: Secondary | ICD-10-CM | POA: Diagnosis not present

## 2014-08-06 DIAGNOSIS — Z1389 Encounter for screening for other disorder: Secondary | ICD-10-CM | POA: Diagnosis not present

## 2014-08-06 DIAGNOSIS — I10 Essential (primary) hypertension: Secondary | ICD-10-CM | POA: Diagnosis not present

## 2014-08-06 DIAGNOSIS — Z136 Encounter for screening for cardiovascular disorders: Secondary | ICD-10-CM | POA: Diagnosis not present

## 2014-08-06 DIAGNOSIS — E139 Other specified diabetes mellitus without complications: Secondary | ICD-10-CM | POA: Diagnosis not present

## 2014-08-07 ENCOUNTER — Ambulatory Visit (INDEPENDENT_AMBULATORY_CARE_PROVIDER_SITE_OTHER): Payer: Medicare Other | Admitting: Cardiovascular Disease

## 2014-08-07 ENCOUNTER — Encounter: Payer: Self-pay | Admitting: Cardiovascular Disease

## 2014-08-07 VITALS — BP 126/70 | HR 59 | Ht 69.0 in | Wt 202.6 lb

## 2014-08-07 DIAGNOSIS — I10 Essential (primary) hypertension: Secondary | ICD-10-CM

## 2014-08-07 DIAGNOSIS — I2510 Atherosclerotic heart disease of native coronary artery without angina pectoris: Secondary | ICD-10-CM | POA: Diagnosis not present

## 2014-08-07 NOTE — Progress Notes (Signed)
08/07/2014 Lindsay Nguyen   01-09-1936  [id number]  Primary Physician Nguyen, Lindsay Bill, MD Primary Cardiologist: Lindsay Harp MD Lindsay Nguyen   HPI:  The patient returns today for 65-month followup. She is a pleasant, 79 year old, mildly overweight, married Caucasian female, mother of 2, grandmother to 2 grandchildren accompanied by her son..  Her risk factors include family history, hypertension, hyperlipidemia and noninsulin-requiring diabetes. She had a non-ST-segment-elevation myocardial infarction March 21, 2010, with peak troponin of 4. I brought her to the cath lab the following day and stented her proximal LAD with a bare-metal stent. She did have nonobstructive disease beyond the stent as well as a diagonal branch, AV groove circumflex with an EF of 45% and anteroapical akinesia. Because of a generous thoracic aorta on cath, I performed a CT angiogram on her revealing her thoracic aorta which measured 4.9 cm. She was seen by Dr. Gilford Nguyen for evaluation of this at my request. Myoview performed June 11, 2010, showed apical and inferior ischemia as well as anterior ischemia. I noted her June 17, 2010, with rapid onset dyspnea relieved with sublingual nitroglycerin. The patient ruled out for myocardial infarction and was heparinized. I ultimately cath'd her November 16 revealing aggressive "in-stent restenosis" within the proximal LAD stent, as well as in her diagonal branch and AV groove circumflex.   She ultimately underwent coronary artery bypass grafting by Dr. Gilford Nguyen with LIMA to her LAD, a vein to a diagonal branch, as well as obtuse marginal branch with resection and grafting of her thoracic aortic aneurysm using a 20-mm supracoronary Hemashield 2 graft utilizing deep hypothermic circulatory arrest with resuspension of her native aortic valve and reimplantation of her native coronary arteries. Her postop course was complicated and prolonged, lasting 4 weeks.  She did have atrial fibrillation, pneumonia, cholecystitis status post cholecystectomy, as well as pleural effusions. She had a percutaneous drain of her gallbladder and ultimately cholecystectomy by Dr. Excell Nguyen September 18, 2010. She recuperated nicely. She had a Myoview performed November 04, 2010, which was nonischemic and a 2D echo that showed normal LV systolic function with a normal functioning aortic valve.  Since I saw her in the office one year ago she's remained stable. She did see Lindsay Nguyen [facility] in the office 6 months ago. She had a 2-D echocardiogram performed 02/01/14 which was entirely normal. Her only complaints are of fatigue..   Current Outpatient Prescriptions  Medication Sig Dispense Refill  . alendronate (FOSAMAX) 70 MG tablet Take 70 mg by mouth once a week. Take with a full glass of water on an empty stomach.    Marland Kitchen amLODipine (NORVASC) 2.5 MG tablet Take 2.5 mg by mouth daily.    Marland Kitchen aspirin 325 MG tablet Take 325 mg by mouth daily.    Marland Kitchen atorvastatin (LIPITOR) 20 MG tablet Take 10 mg by mouth daily.     . budesonide (PULMICORT) 180 MCG/ACT inhaler Inhale 2 puffs into the lungs 2 (two) times daily.    . carvedilol (COREG) 12.5 MG tablet Take 1.5 tablets (18.75 mg total) by mouth in the morning and take 1 tablet (12.5 mg total) by mouth in the evening. 135 tablet 1  . Cholecalciferol (VITAMIN D3) 2000 UNITS TABS Take by mouth daily.      . ciprofloxacin (CIPRO) 500 MG tablet   0  . escitalopram (LEXAPRO) 5 MG tablet Take 10 mg by mouth daily.    . furosemide (LASIX) 40 MG tablet TAKE 1 TABLET DAILY 90  tablet 2  . levalbuterol (XOPENEX HFA) 45 MCG/ACT inhaler Inhale 1-2 puffs into the lungs every 4 (four) hours as needed.      Marland Kitchen levothyroxine (SYNTHROID, LEVOTHROID) 112 MCG tablet 1/2 tab daily, BRAND only per Dr palel    . metFORMIN (GLUCOPHAGE) 1000 MG tablet Take 1,000 mg by mouth 2 (two) times daily with a meal.      . montelukast (SINGULAIR) 10 MG tablet Take 10 mg by mouth at  bedtime.    . naproxen sodium (ANAPROX) 220 MG tablet Take 220 mg by mouth 2 (two) times daily with a meal.    . nitroGLYCERIN (NITROSTAT) 0.4 MG SL tablet Place 0.4 mg under the tongue every 5 (five) minutes as needed.      Marland Kitchen omeprazole (PRILOSEC) 20 MG capsule Take 20 mg by mouth 3 (three) times a week.    . saxagliptin HCl (ONGLYZA) 5 MG TABS tablet Take 5 mg by mouth daily.      . traZODone (DESYREL) 100 MG tablet Take 100 mg by mouth at bedtime.    . valsartan (DIOVAN) 160 MG tablet Take 1 tablet (160 mg total) by mouth daily. 90 tablet 3  . amLODipine (NORVASC) 5 MG tablet Take 2.5 mg by mouth daily.       No current facility-administered medications for this visit.    Allergies  Allergen Reactions  . Iodine Swelling    Throat  . Contrast Media [Iodinated Diagnostic Agents]   . Gabapentin Hives  . Shellfish Allergy     History   Social History  . Marital Status: Married    Spouse Name: N/A    Number of Children: N/A  . Years of Education: N/A   Occupational History  . Not on file.   Social History Main Topics  . Smoking status: Never Smoker   . Smokeless tobacco: Never Used  . Alcohol Use: No  . Drug Use: No  . Sexual Activity: Not on file   Other Topics Concern  . Not on file   Social History Narrative     Review of Systems: General: negative for chills, fever, night sweats or weight changes.  Cardiovascular: negative for chest pain, dyspnea on exertion, edema, orthopnea, palpitations, paroxysmal nocturnal dyspnea or shortness of breath Dermatological: negative for rash Respiratory: negative for cough or wheezing Urologic: negative for hematuria Abdominal: negative for nausea, vomiting, diarrhea, bright red blood per rectum, melena, or hematemesis Neurologic: negative for visual changes, syncope, or dizziness All other systems reviewed and are otherwise negative except as noted above.    Blood pressure 126/70, pulse 59, height 5\' 9"  (1.753 m), weight  202 lb 9.6 oz (91.899 kg).  General appearance: alert and no distress Neck: no adenopathy, no carotid bruit, no JVD, supple, symmetrical, trachea midline and thyroid not enlarged, symmetric, no tenderness/mass/nodules Lungs: clear to auscultation bilaterally Heart: soft outflow tract murmur Extremities: extremities normal, atraumatic, no cyanosis or edema  EKG sinus bradycardia at 59 with left anterior fascicular block and LVH with QRS widening. I personally reviewed this EKG  ASSESSMENT AND PLAN:   Essential hypertension History of hypertension with blood pressure measured today at 126/70. She is on carvedilol in addition to Diovan and amlodipine. Continue current meds at current dosing  DYSLIPIDEMIA History of hyperlipidemia on atorvastatin 20 mg a day followed by her PCP  Coronary atherosclerosis History of CAD status post stenting of her proximal LAD with a bare metal stent. She did have a generous thoracic aorta and a CT  and gram subsequently revealed her to have a 4.9 cm thoracic aortic aneurysm. She ultimately underwent coronary artery bypass grafting by Dr. Arvid Right with a LIMA to LAD and a vein to diagonal branch as well as obtuse marginal branch with resection and grafting of her thoracic aortic aneurysm using a 20 mm supra coronary hemi-shield graft E utilizing deep hypothermic circulatory arrest with resuspension of her native aortic valve and reimplantation of her native coronary arteries. Her postoperative course was prolonged and complicated by paroxysmal [name] fibrillation, pneumonia and cholecystitis as well as pleural effusions. She had a Myoview stress test performed 11/04/10 which was nonischemic and normal 2-D echo. She denies chest pain or shortness of breath.      Lindsay Harp MD FACP,FACC,FAHA, [facility] 08/07/2014 1:38 PM

## 2014-08-07 NOTE — Assessment & Plan Note (Signed)
History of hyperlipidemia on atorvastatin 20 mg a day followed by her PCP 

## 2014-08-07 NOTE — Patient Instructions (Signed)
Your physician wants you to follow-up in 1 year with Dr. Berry. You will receive a reminder letter in the mail 2 months in advance. If you do not receive a letter, please call our office to schedule the follow-up appointment.  

## 2014-08-07 NOTE — Assessment & Plan Note (Signed)
History of CAD status post stenting of her proximal LAD with a bare metal stent. She did have a generous thoracic aorta and a CT and gram subsequently revealed her to have a 4.9 cm thoracic aortic aneurysm. She ultimately underwent coronary artery bypass grafting by Dr. Arvid Right with a LIMA to LAD and a vein to diagonal branch as well as obtuse marginal branch with resection and grafting of her thoracic aortic aneurysm using a 20 mm supra coronary hemi-shield graft E utilizing deep hypothermic circulatory arrest with resuspension of her native aortic valve and reimplantation of her native coronary arteries. Her postoperative course was prolonged and complicated by paroxysmal [name] fibrillation, pneumonia and cholecystitis as well as pleural effusions. She had a Myoview stress test performed 11/04/10 which was nonischemic and normal 2-D echo. She denies chest pain or shortness of breath.

## 2014-08-07 NOTE — Assessment & Plan Note (Signed)
History of hypertension with blood pressure measured today at 126/70. She is on carvedilol in addition to Diovan and amlodipine. Continue current meds at current dosing

## 2014-08-21 DIAGNOSIS — J45909 Unspecified asthma, uncomplicated: Secondary | ICD-10-CM | POA: Diagnosis not present

## 2014-08-21 DIAGNOSIS — Z1589 Genetic susceptibility to other disease: Secondary | ICD-10-CM | POA: Diagnosis not present

## 2014-08-21 DIAGNOSIS — E089 Diabetes mellitus due to underlying condition without complications: Secondary | ICD-10-CM | POA: Diagnosis not present

## 2014-08-21 DIAGNOSIS — E119 Type 2 diabetes mellitus without complications: Secondary | ICD-10-CM | POA: Diagnosis not present

## 2014-09-27 ENCOUNTER — Telehealth: Payer: Self-pay | Admitting: Cardiovascular Disease

## 2014-09-27 MED ORDER — CARVEDILOL 12.5 MG PO TABS: ORAL_TABLET | ORAL | Status: DC

## 2014-09-27 NOTE — Telephone Encounter (Signed)
°  1. Which medications need to be refilled? Carvedilol  2. Which pharmacy is medication to be sent to?[phone]  3. Do they need a 30 day or 90 day supply? 90 and refills 4. Would they like a call back once the medication has been sent to the pharmacy? yes

## 2014-09-27 NOTE — Telephone Encounter (Signed)
Refill submitted to patient's preferred pharmacy. Informed patient. Pt voiced understanding, no other stated concerns at this time.  

## 2014-11-27 DIAGNOSIS — E780 Pure hypercholesterolemia: Secondary | ICD-10-CM | POA: Diagnosis not present

## 2014-11-27 DIAGNOSIS — I10 Essential (primary) hypertension: Secondary | ICD-10-CM | POA: Diagnosis not present

## 2014-11-27 DIAGNOSIS — F419 Anxiety disorder, unspecified: Secondary | ICD-10-CM | POA: Diagnosis not present

## 2014-11-27 DIAGNOSIS — E139 Other specified diabetes mellitus without complications: Secondary | ICD-10-CM | POA: Diagnosis not present

## 2014-11-27 DIAGNOSIS — G4700 Insomnia, unspecified: Secondary | ICD-10-CM | POA: Diagnosis not present

## 2015-01-04 DIAGNOSIS — N390 Urinary tract infection, site not specified: Secondary | ICD-10-CM | POA: Diagnosis not present

## 2015-01-04 DIAGNOSIS — R399 Unspecified symptoms and signs involving the genitourinary system: Secondary | ICD-10-CM | POA: Diagnosis not present

## 2015-01-04 DIAGNOSIS — R319 Hematuria, unspecified: Secondary | ICD-10-CM | POA: Diagnosis not present

## 2015-01-04 DIAGNOSIS — R8299 Other abnormal findings in urine: Secondary | ICD-10-CM | POA: Diagnosis not present

## 2015-01-08 DIAGNOSIS — D225 Melanocytic nevi of trunk: Secondary | ICD-10-CM | POA: Diagnosis not present

## 2015-01-08 DIAGNOSIS — L859 Epidermal thickening, unspecified: Secondary | ICD-10-CM | POA: Diagnosis not present

## 2015-01-08 DIAGNOSIS — L814 Other melanin hyperpigmentation: Secondary | ICD-10-CM | POA: Diagnosis not present

## 2015-01-08 DIAGNOSIS — L821 Other seborrheic keratosis: Secondary | ICD-10-CM | POA: Diagnosis not present

## 2015-01-08 DIAGNOSIS — L720 Epidermal cyst: Secondary | ICD-10-CM | POA: Diagnosis not present

## 2015-01-08 DIAGNOSIS — L570 Actinic keratosis: Secondary | ICD-10-CM | POA: Diagnosis not present

## 2015-04-16 DIAGNOSIS — J45909 Unspecified asthma, uncomplicated: Secondary | ICD-10-CM | POA: Diagnosis not present

## 2015-04-16 DIAGNOSIS — F419 Anxiety disorder, unspecified: Secondary | ICD-10-CM | POA: Diagnosis not present

## 2015-04-16 DIAGNOSIS — J309 Allergic rhinitis, unspecified: Secondary | ICD-10-CM | POA: Diagnosis not present

## 2015-04-16 DIAGNOSIS — E119 Type 2 diabetes mellitus without complications: Secondary | ICD-10-CM | POA: Diagnosis not present

## 2015-04-16 DIAGNOSIS — E780 Pure hypercholesterolemia: Secondary | ICD-10-CM | POA: Diagnosis not present

## 2015-04-16 DIAGNOSIS — I10 Essential (primary) hypertension: Secondary | ICD-10-CM | POA: Diagnosis not present

## 2015-04-16 DIAGNOSIS — H6121 Impacted cerumen, right ear: Secondary | ICD-10-CM | POA: Diagnosis not present

## 2015-04-16 DIAGNOSIS — E039 Hypothyroidism, unspecified: Secondary | ICD-10-CM | POA: Diagnosis not present

## 2015-04-23 DIAGNOSIS — M899 Disorder of bone, unspecified: Secondary | ICD-10-CM | POA: Diagnosis not present

## 2015-04-23 DIAGNOSIS — Z1231 Encounter for screening mammogram for malignant neoplasm of breast: Secondary | ICD-10-CM | POA: Diagnosis not present

## 2015-07-25 ENCOUNTER — Telehealth: Payer: Self-pay | Admitting: Cardiovascular Disease

## 2015-07-25 NOTE — Telephone Encounter (Signed)
Spoke with pt dtr, she reports the pt has increased fatigue and SOB. Spoke with pt, she does admit to feeling more tired. She denies chest pain, SOB or heart racing. She does say she has some dizziness when stranding after bending and turning her head. She has an appt 08-21-15 with dr berry but requested a sooner appt. Follow up scheduled

## 2015-07-25 NOTE — Telephone Encounter (Signed)
Karen(daughter) is calling because Mrs. Adamson is feeling more fatique and some shortness of breath . Please call   Thanks

## 2015-08-07 ENCOUNTER — Ambulatory Visit (INDEPENDENT_AMBULATORY_CARE_PROVIDER_SITE_OTHER): Payer: Medicare Other | Admitting: Cardiovascular Disease

## 2015-08-07 ENCOUNTER — Ambulatory Visit: Payer: Medicare Other

## 2015-08-07 ENCOUNTER — Encounter: Payer: Self-pay | Admitting: Cardiovascular Disease

## 2015-08-07 VITALS — BP 142/84 | HR 75 | Ht 68.0 in | Wt 204.0 lb

## 2015-08-07 DIAGNOSIS — I10 Essential (primary) hypertension: Secondary | ICD-10-CM

## 2015-08-07 DIAGNOSIS — I481 Persistent atrial fibrillation: Secondary | ICD-10-CM | POA: Diagnosis not present

## 2015-08-07 DIAGNOSIS — I4891 Unspecified atrial fibrillation: Secondary | ICD-10-CM

## 2015-08-07 DIAGNOSIS — I4819 Other persistent atrial fibrillation: Secondary | ICD-10-CM

## 2015-08-07 DIAGNOSIS — I482 Chronic atrial fibrillation, unspecified: Secondary | ICD-10-CM | POA: Insufficient documentation

## 2015-08-07 LAB — BASIC METABOLIC PANEL
BUN: 18 mg/dL (ref 7–25)
CHLORIDE: 98 mmol/L (ref 98–110)
CO2: 28 mmol/L (ref 20–31)
Calcium: 9.7 mg/dL (ref 8.6–10.4)
Creat: 1.01 mg/dL — ABNORMAL HIGH (ref 0.60–0.93)
GLUCOSE: 235 mg/dL — AB (ref 65–99)
POTASSIUM: 4.8 mmol/L (ref 3.5–5.3)
SODIUM: 137 mmol/L (ref 135–146)

## 2015-08-07 MED ORDER — ASPIRIN EC 81 MG PO TBEC: 81.0000 mg | DELAYED_RELEASE_TABLET | Freq: Every day | ORAL | Status: AC

## 2015-08-07 NOTE — Patient Instructions (Signed)
Medication Instructions:  Your physician has recommended you make the following change in your medication:  1) DECREASE Aspirin to 81 mg by mouth ONCE daily   Labwork: Your physician recommends that you return for lab work in: [facility] - as soon as possible The lab can be Nguyen on the FIRST FLOOR of out building in [address]   Testing/Procedures: Your physician has requested that you have an echocardiogram. Echocardiography is a painless test that uses sound waves to create images of your heart. It provides your doctor with information about the size and shape of your heart and how well your heart's chambers and valves are working. This procedure takes approximately one hour. There are no restrictions for this procedure.  Your Doctor has ordered you to wear a heart monitor. You will wear this for 30 days.   TIPS -  REMINDERS 1. The sensor is the lanyard that is worn around your neck every day - this is powered by a battery that needs to be changed every day 2. The monitor is the device that allows you to record symptoms - this will need to be charged daily 3. The sensor & monitor need to be within 100 feet of each other at all times 4. The sensor connects to the electrodes (stickers) - these should be changed every 24-48 hours (you do not have to remove them when you bathe, just make sure they are dry when you connect it back to the sensor 5. If you need more supplies (electrodes, batteries), please call the 1-800 # on the back of the pamphlet and [employer] will mail you more supplies 6. If your skin becomes sensitive, please try the sample pack of sensitive skin electrodes (the white packet in your silver box) and call [employer] to have them mail you more of these type of electrodes 7. When you are finish wearing the monitor, please place all supplies back in the silver box, place the silver box in the pre-packaged UPS bag and drop off at [employer] or call them so they can come pick it up   Cardiac  Event Monitoring A cardiac event monitor is a small recording device used to help detect abnormal heart rhythms (arrhythmias). The monitor is used to record heart rhythm when noticeable symptoms such as the following occur:  Fast heartbeats (palpitations), such as heart racing or fluttering.  Dizziness.  Fainting or light-headedness.  Unexplained weakness. The monitor is wired to two electrodes placed on your chest. Electrodes are flat, sticky disks that attach to your skin. The monitor can be worn for up to 30 days. You will wear the monitor at all times, except when bathing.  HOW TO USE YOUR CARDIAC EVENT MONITOR A technician will prepare your chest for the electrode placement. The technician will show you how to place the electrodes, how to work the monitor, and how to replace the batteries. Take time to practice using the monitor before you leave the office. Make sure you understand how to send the information from the monitor to your health care provider. This requires a telephone with a landline, not a cell phone. You need to:  Wear your monitor at all times, except when you are in water:  Do not get the monitor wet.  Take the monitor off when bathing. Do not swim or use a hot tub with it on.  Keep your skin clean. Do not put body lotion or moisturizer on your chest.  Change the electrodes daily or any time they  stop sticking to your skin. You might need to use tape to keep them on.  It is possible that your skin under the electrodes could become irritated. To keep this from happening, try to put the electrodes in slightly different places on your chest. However, they must remain in the area under your left breast and in the upper right section of your chest.  Make sure the monitor is safely clipped to your clothing or in a location close to your body that your health care provider recommends.  Press the button to record when you feel symptoms of heart trouble, such as dizziness,  weakness, light-headedness, palpitations, thumping, shortness of breath, unexplained weakness, or a fluttering or racing heart. The monitor is always on and records what happened slightly before you pressed the button, so do not worry about being too late to get good information.  Keep a diary of your activities, such as walking, doing chores, and taking medicine. It is especially important to note what you were doing when you pushed the button to record your symptoms. This will help your health care provider determine what might be contributing to your symptoms. The information stored in your monitor will be reviewed by your health care provider alongside your diary entries.  Send the recorded information as recommended by your health care provider. It is important to understand that it will take some time for your health care provider to process the results.  Change the batteries as recommended by your health care provider. SEEK IMMEDIATE MEDICAL CARE IF:   You have chest pain.  You have extreme difficulty breathing or shortness of breath.  You develop a very fast heartbeat that persists.  You develop dizziness that does not go away.  You faint or constantly feel you are about to faint. Document Released: 04/28/2008 Document Revised: 12/04/2013 Document Reviewed: 01/16/2013 [facility] Patient Information 2015 [place], [place]. This information is not intended to replace advice given to you by your health care provider. Make sure you discuss any questions you have with your health care provider.    Follow-Up: Your physician recommends that you schedule a follow-up appointment THIS WEEK - with Lindsay Nguyen - discuss starting Eliquis  Your physician recommends that you schedule a follow-up appointment in: 6 weeks with Lindsay Nguyen   Any Other Special Instructions Will Be Listed Below (If Applicable).     If you need a refill on your cardiac medications before your next appointment, please call  your pharmacy.

## 2015-08-07 NOTE — Assessment & Plan Note (Signed)
Miscellaneous complain of excessive fatigue and lethargy over the last 6 weeks. Her EKG shows atrial fibrillation with a controlled ventricular response of 75 and left anterior fascicular block and incomplete left bundle-branch block.The CHA2DSVASC2 score is  7 . I'm going to get a 2-D echo, start her on Eliquis oral anticoagulation. I am going to schedule her to undergo outpatient DC cardioversion in 4-6 weeks. In addition, I'm going to get a third event monitor to make sure that this is not paroxysmal.

## 2015-08-07 NOTE — Assessment & Plan Note (Signed)
History of hypertension blood pressure measured at 142/84. She is on amlodipine, carvedilol and Diovan. Continue current meds at current dosing

## 2015-08-07 NOTE — Assessment & Plan Note (Signed)
History of hyperlipidemia on statin therapy followed by her PCP. 

## 2015-08-07 NOTE — Assessment & Plan Note (Signed)
History of CAD status post coronary artery bypass grafting by Dr. Cyndia Bent with a LIMA to LAD, vein to diagonal branch and obtuse marginal branch at the time of dissection and grafting of her thoracic aortic aneurysm. Her native coronary arteries were reimplanted. Her postoperative course is competent. Prolonged lasting 4 weeks. She did have postoperative [name]. She denies chest pain or shortness of breath.

## 2015-08-07 NOTE — Progress Notes (Signed)
08/07/2015 Lindsay Nguyen   July 09, 1936  [id number]  Primary Physician PATEL, Regis Bill, MD Primary Cardiologist: [name] MD [name]   HPI:  The patient returns today for 20-month followup. She is a pleasant, 80 year old, mildly overweight, married Caucasian female, mother of 2, grandmother to 2 grandchildren accompanied by her son and daughter.. Her risk factors include family history, hypertension, hyperlipidemia and noninsulin-requiring diabetes. She had a non-ST-segment-elevation myocardial infarction March 21, 2010, with peak troponin of 4. I brought her to the cath lab the following day and stented her proximal LAD with a bare-metal stent. She did have nonobstructive disease beyond the stent as well as a diagonal branch, AV groove circumflex with an EF of 45% and anteroapical akinesia. Because of a generous thoracic aorta on cath, I performed a CT angiogram on her revealing her thoracic aorta which measured 4.9 cm. She was seen by Dr. Gilford Raid for evaluation of this at my request. Myoview performed June 11, 2010, showed apical and inferior ischemia as well as anterior ischemia. I noted her June 17, 2010, with rapid onset dyspnea relieved with sublingual nitroglycerin. The patient ruled out for myocardial infarction and was heparinized. I ultimately cath'd her November 16 revealing aggressive "in-stent restenosis" within the proximal LAD stent, as well as in her diagonal branch and AV groove circumflex.   She ultimately underwent coronary artery bypass grafting by Dr. Gilford Raid with LIMA to her LAD, a vein to a diagonal branch, as well as obtuse marginal branch with resection and grafting of her thoracic aortic aneurysm using a 20-mm supracoronary Hemashield 2 graft utilizing deep hypothermic circulatory arrest with resuspension of her native aortic valve and reimplantation of her native coronary arteries. Her postop course was complicated and prolonged,  lasting 4 weeks. She did have atrial fibrillation, pneumonia, cholecystitis status post cholecystectomy, as well as pleural effusions. She had a percutaneous drain of her gallbladder and ultimately cholecystectomy by Dr. Excell Seltzer September 18, 2010. She recuperated nicely. She had a Myoview performed November 04, 2010, which was nonischemic and a 2D echo that showed normal LV systolic function with a normal functioning aortic valve.  Since I saw her in the office one year ago she's remained stable.  She had a 2-D echocardiogram performed 02/01/14 which was entirely normal.  She complains of increased lethargy and fatigue. Her EKG shows atrial fibrillation with a controlled ventricular response.   Current Outpatient Prescriptions  Medication Sig Dispense Refill  . alendronate (FOSAMAX) 70 MG tablet Take 70 mg by mouth once a week. Take with a full glass of water on an empty stomach.    Marland Kitchen amLODipine (NORVASC) 2.5 MG tablet Take 2.5 mg by mouth daily.    Marland Kitchen atorvastatin (LIPITOR) 20 MG tablet Take 10 mg by mouth daily.     . budesonide (PULMICORT) 180 MCG/ACT inhaler Inhale 2 puffs into the lungs 2 (two) times daily.    . carvedilol (COREG) 12.5 MG tablet Take 1.5 tablets (18.75 mg total) by mouth in the morning and take 1 tablet (12.5 mg total) by mouth in the evening. 225 tablet 3  . Cholecalciferol (VITAMIN D3) 2000 UNITS TABS Take by mouth daily.      Marland Kitchen escitalopram (LEXAPRO) 5 MG tablet Take 10 mg by mouth daily.    . furosemide (LASIX) 40 MG tablet TAKE 1 TABLET DAILY 90 tablet 2  . levalbuterol (XOPENEX HFA) 45 MCG/ACT inhaler Inhale 1-2 puffs into the lungs every 4 (four) hours as needed.      Marland Kitchen  levothyroxine (SYNTHROID, LEVOTHROID) 112 MCG tablet 1/2 tab daily, BRAND only per Dr palel    . metFORMIN (GLUCOPHAGE) 1000 MG tablet Take 1,000 mg by mouth 2 (two) times daily with a meal.      . montelukast (SINGULAIR) 10 MG tablet Take 10 mg by mouth at bedtime.    . naproxen sodium (ANAPROX) 220 MG  tablet Take 220 mg by mouth 2 (two) times daily with a meal.    . nitroGLYCERIN (NITROSTAT) 0.4 MG SL tablet Place 0.4 mg under the tongue every 5 (five) minutes as needed.      . saxagliptin HCl (ONGLYZA) 5 MG TABS tablet Take 5 mg by mouth daily.      . traZODone (DESYREL) 100 MG tablet Take 100 mg by mouth at bedtime.    . valsartan (DIOVAN) 160 MG tablet Take 1 tablet (160 mg total) by mouth daily. 90 tablet 3  . amLODipine (NORVASC) 5 MG tablet Take 2.5 mg by mouth daily.      Marland Kitchen aspirin EC 81 MG tablet Take 1 tablet (81 mg total) by mouth daily. 90 tablet 3   No current facility-administered medications for this visit.    Allergies  Allergen Reactions  . Iodine Swelling    Throat  . Contrast Media [Iodinated Diagnostic Agents]   . Gabapentin Hives  . Shellfish Allergy     Social History   Social History  . Marital Status: Married    Spouse Name: N/A  . Number of Children: N/A  . Years of Education: N/A   Occupational History  . Not on file.   Social History Main Topics  . Smoking status: Never Smoker   . Smokeless tobacco: Never Used  . Alcohol Use: No  . Drug Use: No  . Sexual Activity: Not on file   Other Topics Concern  . Not on file   Social History Narrative     Review of Systems: General: negative for chills, fever, night sweats or weight changes.  Cardiovascular: negative for chest pain, dyspnea on exertion, edema, orthopnea, palpitations, paroxysmal nocturnal dyspnea or shortness of breath Dermatological: negative for rash Respiratory: negative for cough or wheezing Urologic: negative for hematuria Abdominal: negative for nausea, vomiting, diarrhea, bright red blood per rectum, melena, or hematemesis Neurologic: negative for visual changes, syncope, or dizziness All other systems reviewed and are otherwise negative except as noted above.    Blood pressure 142/84, pulse 75, height 5\' 8"  (1.727 m), weight 204 lb (92.534 kg).  General appearance:  alert and no distress Neck: no adenopathy, no carotid bruit, no JVD, supple, symmetrical, trachea midline and thyroid not enlarged, symmetric, no tenderness/mass/nodules Lungs: clear to auscultation bilaterally Heart: irregularly irregular rhythm Extremities: extremities normal, atraumatic, no cyanosis or edema  EKG atrial fibrillation with a controlled ventricular response of 75, incomplete left bundle-branch block with left anterior fascicular block. I personally reviewed this EKG  ASSESSMENT AND PLAN:   PVD (peripheral vascular disease)- repair of thoracic Aorta 11/11- Echo WNL 4/12 Status post repair of thoracic aortic aneurysm November 2011.  Essential hypertension History of hypertension blood pressure measured at 142/84. She is on amlodipine, carvedilol and Diovan. Continue current meds at current dosing  DYSLIPIDEMIA History of hyperlipidemia on statin therapy followed by her PCP  Coronary atherosclerosis History of CAD status post coronary artery bypass grafting by Dr. Cyndia Bent with a LIMA to LAD, vein to diagonal branch and obtuse marginal branch at the time of dissection and grafting of her thoracic aortic aneurysm. Her  native coronary arteries were reimplanted. Her postoperative course is competent. Prolonged lasting 4 weeks. She did have postoperative [name]. She denies chest pain or shortness of breath.  Atrial fibrillation (HCC) Miscellaneous complain of excessive fatigue and lethargy over the last 6 weeks. Her EKG shows atrial fibrillation with a controlled ventricular response of 75 and left anterior fascicular block and incomplete left bundle-branch block.The CHA2DSVASC2 score is  7 . I'm going to get a 2-D echo, start her on Eliquis oral anticoagulation. I am going to schedule her to undergo outpatient DC cardioversion in 4-6 weeks. In addition, I'm going to get a third event monitor to make sure that this is not paroxysmal.      [name] MD [facility],  [facility] 08/07/2015 4:19 PM

## 2015-08-07 NOTE — Assessment & Plan Note (Signed)
Status post repair of thoracic aortic aneurysm November 2011.

## 2015-08-08 ENCOUNTER — Ambulatory Visit (INDEPENDENT_AMBULATORY_CARE_PROVIDER_SITE_OTHER): Payer: Medicare Other | Admitting: Pharmacist Clinician (PhC)/ Clinical Pharmacy Specialist

## 2015-08-08 DIAGNOSIS — I4891 Unspecified atrial fibrillation: Secondary | ICD-10-CM

## 2015-08-08 MED ORDER — APIXABAN 5 MG PO TABS
5.0000 mg | ORAL_TABLET | Freq: Two times a day (BID) | ORAL | Status: DC
Start: 2015-08-08 — End: 2016-05-21

## 2015-08-08 NOTE — Progress Notes (Signed)
Patient is here today with adult son and daughter.  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  Discussed the AF diagnosis and need for anticoagulation based on her CHADS2-VASc score of 9.  (She had previous lacunar stroke according to daughter, not sure when, but no complications - this is not in her chart).  Reviewed the options available including warfarin and various NOACs.  Daughter familiar with all of these.  Agreed on conversation to start patient on Eliquis 5 mg bid (Scr 1.01, Wt 92 kg, Age 80).  She is currently wearing a monitor then depending on results will possibly need DCCV.  Explained the need to be anticoagulated for at least a month prior to DCCV as well as after.   Gave patient 2 weeks of samples and card good for 30 day supply at [place].  Suggested that they might hang onto card until she hits donut hole on insurance later in the year, to help pay for Eliquis at that time.  Reviewed safety information, bleeding risks and answered all questions from all family members.

## 2015-08-15 ENCOUNTER — Ambulatory Visit (HOSPITAL_COMMUNITY): Payer: Medicare Other | Attending: Cardiovascular Disease

## 2015-08-15 ENCOUNTER — Other Ambulatory Visit: Payer: Self-pay

## 2015-08-15 DIAGNOSIS — I071 Rheumatic tricuspid insufficiency: Secondary | ICD-10-CM | POA: Diagnosis not present

## 2015-08-15 DIAGNOSIS — I517 Cardiomegaly: Secondary | ICD-10-CM | POA: Insufficient documentation

## 2015-08-15 DIAGNOSIS — Z8249 Family history of ischemic heart disease and other diseases of the circulatory system: Secondary | ICD-10-CM | POA: Insufficient documentation

## 2015-08-15 DIAGNOSIS — Z951 Presence of aortocoronary bypass graft: Secondary | ICD-10-CM | POA: Diagnosis not present

## 2015-08-15 DIAGNOSIS — I059 Rheumatic mitral valve disease, unspecified: Secondary | ICD-10-CM | POA: Diagnosis not present

## 2015-08-15 DIAGNOSIS — E119 Type 2 diabetes mellitus without complications: Secondary | ICD-10-CM | POA: Diagnosis not present

## 2015-08-15 DIAGNOSIS — I340 Nonrheumatic mitral (valve) insufficiency: Secondary | ICD-10-CM | POA: Diagnosis not present

## 2015-08-15 DIAGNOSIS — I4891 Unspecified atrial fibrillation: Secondary | ICD-10-CM

## 2015-08-15 DIAGNOSIS — E785 Hyperlipidemia, unspecified: Secondary | ICD-10-CM | POA: Insufficient documentation

## 2015-08-15 DIAGNOSIS — I10 Essential (primary) hypertension: Secondary | ICD-10-CM | POA: Diagnosis not present

## 2015-08-21 ENCOUNTER — Telehealth: Payer: Self-pay | Admitting: Cardiovascular Disease

## 2015-08-21 ENCOUNTER — Ambulatory Visit: Payer: Medicare Other | Admitting: Cardiovascular Disease

## 2015-08-21 DIAGNOSIS — E119 Type 2 diabetes mellitus without complications: Secondary | ICD-10-CM | POA: Diagnosis not present

## 2015-08-21 DIAGNOSIS — Z6831 Body mass index (BMI) 31.0-31.9, adult: Secondary | ICD-10-CM | POA: Diagnosis not present

## 2015-08-21 DIAGNOSIS — R5383 Other fatigue: Secondary | ICD-10-CM | POA: Diagnosis not present

## 2015-08-21 DIAGNOSIS — E7800 Pure hypercholesterolemia, unspecified: Secondary | ICD-10-CM | POA: Diagnosis not present

## 2015-08-21 DIAGNOSIS — Z1371 Encounter for nonprocreative screening for genetic disease carrier status: Secondary | ICD-10-CM | POA: Diagnosis not present

## 2015-08-21 DIAGNOSIS — G4700 Insomnia, unspecified: Secondary | ICD-10-CM | POA: Diagnosis not present

## 2015-08-21 DIAGNOSIS — Z1389 Encounter for screening for other disorder: Secondary | ICD-10-CM | POA: Diagnosis not present

## 2015-08-21 DIAGNOSIS — F419 Anxiety disorder, unspecified: Secondary | ICD-10-CM | POA: Diagnosis not present

## 2015-08-21 DIAGNOSIS — Z23 Encounter for immunization: Secondary | ICD-10-CM | POA: Diagnosis not present

## 2015-08-21 DIAGNOSIS — I10 Essential (primary) hypertension: Secondary | ICD-10-CM | POA: Diagnosis not present

## 2015-08-21 NOTE — Telephone Encounter (Signed)
See Previous Encounter

## 2015-08-21 NOTE — Telephone Encounter (Signed)
Santiago Glad ( Daughter ) is calling for her mother , to find out her test results from her echo on last Thursday . Please Mrs. Power is with her daughter Santiago Glad .Marland Kitchen Please call   Thanks

## 2015-08-21 NOTE — Telephone Encounter (Signed)
Lindsay Nguyen is returning a call Please call at [phone]   Thanks

## 2015-08-23 DIAGNOSIS — E119 Type 2 diabetes mellitus without complications: Secondary | ICD-10-CM | POA: Diagnosis not present

## 2015-08-23 DIAGNOSIS — Z1371 Encounter for nonprocreative screening for genetic disease carrier status: Secondary | ICD-10-CM | POA: Diagnosis not present

## 2015-08-23 DIAGNOSIS — I10 Essential (primary) hypertension: Secondary | ICD-10-CM | POA: Diagnosis not present

## 2015-09-19 ENCOUNTER — Other Ambulatory Visit: Payer: Self-pay | Admitting: Cardiovascular Disease

## 2015-09-19 NOTE — Telephone Encounter (Signed)
Rx request sent to pharmacy.  

## 2015-09-20 ENCOUNTER — Encounter: Payer: Self-pay | Admitting: Internal Medicine

## 2015-09-20 ENCOUNTER — Ambulatory Visit (INDEPENDENT_AMBULATORY_CARE_PROVIDER_SITE_OTHER): Payer: Medicare Other | Admitting: Cardiovascular Disease

## 2015-09-20 ENCOUNTER — Encounter: Payer: Self-pay | Admitting: Cardiovascular Disease

## 2015-09-20 VITALS — BP 143/72 | HR 71 | Ht 68.0 in | Wt 205.0 lb

## 2015-09-20 DIAGNOSIS — I4819 Other persistent atrial fibrillation: Secondary | ICD-10-CM

## 2015-09-20 DIAGNOSIS — I481 Persistent atrial fibrillation: Secondary | ICD-10-CM | POA: Diagnosis not present

## 2015-09-20 NOTE — Patient Instructions (Addendum)
Medication Instructions:  Your physician recommends that you continue on your current medications as directed. Please refer to the Current Medication list given to you today.   Labwork: Your physician recommends that you return for lab work in: 7 days before procedure (bmet, cbc, pt/inr/ptt/TSH)    Testing/Procedures: Your physician has recommended that you have a Cardioversion (DCCV). Electrical Cardioversion uses a jolt of electricity to your heart either through paddles or wired patches attached to your chest. This is a controlled, usually prescheduled, procedure. Defibrillation is done under light anesthesia in the hospital, and you usually go home the day of the procedure. This is done to get your heart back into a normal rhythm. You are not awake for the procedure. Please see the instruction sheet given to you today. SCHEDULE FOR WEEK OF 2/27 WITH NELSON OR ROSS  Your physician has recommended that you have a sleep study. This test records several body functions during sleep, including: brain activity, eye movement, oxygen and carbon dioxide blood levels, heart rate and rhythm, breathing rate and rhythm, the flow of air through your mouth and nose, snoring, body muscle movements, and chest and belly movement.   Follow-Up: Your physician recommends that you schedule a follow-up appointment in: [place].   Any Other Special Instructions Will Be Listed Below (If Applicable).     If you need a refill on your cardiac medications before your next appointment, please call your pharmacy.

## 2015-09-20 NOTE — Progress Notes (Signed)
Lindsay Nguyen returns today for follow-up of [name]. I performed an event monitor that showed tachybradycardia syndrome with persistent [name] and a 2-D echo which was essentially normal. Her left atrium was mildly dilated. She has been on Eliquis oral anticoagulation for over 4 weeks. I'm going to obtain an outpatient sleep study and arrange for her to undergo outpatient DC cardioversion in the next 2 weeks. The patient and her family are agreeable with this approach.

## 2015-09-20 NOTE — Assessment & Plan Note (Signed)
History of persistent atrial fibrillation with recent event monitor that showed tachybradycardia syndrome and 2-D echo that was normal. She has been on Elocon oral anticoagulation for over 4 weeks now. I'm going to get an outpatient sleep study and then arrange for her to undergo outpatient DC cardioversion.

## 2015-09-23 ENCOUNTER — Telehealth: Payer: Self-pay

## 2015-09-23 DIAGNOSIS — G4730 Sleep apnea, unspecified: Secondary | ICD-10-CM

## 2015-09-23 NOTE — Telephone Encounter (Signed)
Pt orders placed for sleep study

## 2015-09-25 DIAGNOSIS — I481 Persistent atrial fibrillation: Secondary | ICD-10-CM | POA: Diagnosis not present

## 2015-09-25 DIAGNOSIS — I4891 Unspecified atrial fibrillation: Secondary | ICD-10-CM | POA: Diagnosis not present

## 2015-09-25 LAB — CBC WITH DIFFERENTIAL/PLATELET
BASOS ABS: 0 10*3/uL (ref 0.0–0.1)
BASOS PCT: 0 % (ref 0–1)
EOS ABS: 0.2 10*3/uL (ref 0.0–0.7)
Eosinophils Relative: 3 % (ref 0–5)
HCT: 38.9 % (ref 36.0–46.0)
HEMOGLOBIN: 12.9 g/dL (ref 12.0–15.0)
Lymphocytes Relative: 22 % (ref 12–46)
Lymphs Abs: 1.5 10*3/uL (ref 0.7–4.0)
MCH: 31.5 pg (ref 26.0–34.0)
MCHC: 33.2 g/dL (ref 30.0–36.0)
MCV: 95.1 fL (ref 78.0–100.0)
MONOS PCT: 12 % (ref 3–12)
MPV: 11.2 fL (ref 8.6–12.4)
Monocytes Absolute: 0.8 10*3/uL (ref 0.1–1.0)
NEUTROS ABS: 4.3 10*3/uL (ref 1.7–7.7)
NEUTROS PCT: 63 % (ref 43–77)
PLATELETS: 214 10*3/uL (ref 150–400)
RBC: 4.09 MIL/uL (ref 3.87–5.11)
RDW: 12.9 % (ref 11.5–15.5)
WBC: 6.9 10*3/uL (ref 4.0–10.5)

## 2015-09-25 LAB — APTT: APTT: 32 s (ref 24–37)

## 2015-09-25 LAB — PROTIME-INR
INR: 1.4 (ref <1.50)
PROTHROMBIN TIME: 17.3 s — AB (ref 11.6–15.2)

## 2015-09-26 LAB — BASIC METABOLIC PANEL
BUN: 19 mg/dL (ref 7–25)
CALCIUM: 9.2 mg/dL (ref 8.6–10.4)
CHLORIDE: 100 mmol/L (ref 98–110)
CO2: 28 mmol/L (ref 20–31)
CREATININE: 0.95 mg/dL — AB (ref 0.60–0.93)
Glucose, Bld: 208 mg/dL — ABNORMAL HIGH (ref 65–99)
Potassium: 4.6 mmol/L (ref 3.5–5.3)
SODIUM: 138 mmol/L (ref 135–146)

## 2015-09-26 LAB — TSH: TSH: 1.85 m[IU]/L

## 2015-09-27 ENCOUNTER — Other Ambulatory Visit: Payer: Self-pay

## 2015-09-30 ENCOUNTER — Encounter (HOSPITAL_COMMUNITY): Payer: Self-pay | Admitting: Certified Registered"

## 2015-09-30 ENCOUNTER — Ambulatory Visit (HOSPITAL_COMMUNITY): Payer: Medicare Other | Admitting: Certified Registered"

## 2015-09-30 ENCOUNTER — Encounter (HOSPITAL_COMMUNITY)
Admission: RE | Disposition: A | Discharge status: Home / Self Care | Payer: Self-pay | Source: Ambulatory Visit | Attending: Internal Medicine

## 2015-09-30 ENCOUNTER — Ambulatory Visit (HOSPITAL_COMMUNITY)
Admission: RE | Admit: 2015-09-30 | Discharge: 2015-09-30 | Disposition: A | Discharge status: Home / Self Care | Payer: Medicare Other | Source: Ambulatory Visit | Attending: Internal Medicine | Admitting: Internal Medicine

## 2015-09-30 DIAGNOSIS — K219 Gastro-esophageal reflux disease without esophagitis: Secondary | ICD-10-CM | POA: Insufficient documentation

## 2015-09-30 DIAGNOSIS — I481 Persistent atrial fibrillation: Secondary | ICD-10-CM | POA: Diagnosis not present

## 2015-09-30 DIAGNOSIS — I495 Sick sinus syndrome: Secondary | ICD-10-CM | POA: Diagnosis not present

## 2015-09-30 DIAGNOSIS — Z79899 Other long term (current) drug therapy: Secondary | ICD-10-CM | POA: Insufficient documentation

## 2015-09-30 DIAGNOSIS — Z951 Presence of aortocoronary bypass graft: Secondary | ICD-10-CM | POA: Insufficient documentation

## 2015-09-30 DIAGNOSIS — I2510 Atherosclerotic heart disease of native coronary artery without angina pectoris: Secondary | ICD-10-CM | POA: Diagnosis not present

## 2015-09-30 DIAGNOSIS — I252 Old myocardial infarction: Secondary | ICD-10-CM | POA: Insufficient documentation

## 2015-09-30 DIAGNOSIS — I739 Peripheral vascular disease, unspecified: Secondary | ICD-10-CM | POA: Diagnosis not present

## 2015-09-30 DIAGNOSIS — E039 Hypothyroidism, unspecified: Secondary | ICD-10-CM | POA: Insufficient documentation

## 2015-09-30 DIAGNOSIS — I4891 Unspecified atrial fibrillation: Secondary | ICD-10-CM | POA: Diagnosis not present

## 2015-09-30 DIAGNOSIS — E119 Type 2 diabetes mellitus without complications: Secondary | ICD-10-CM | POA: Insufficient documentation

## 2015-09-30 DIAGNOSIS — R001 Bradycardia, unspecified: Secondary | ICD-10-CM | POA: Diagnosis not present

## 2015-09-30 DIAGNOSIS — I10 Essential (primary) hypertension: Secondary | ICD-10-CM | POA: Insufficient documentation

## 2015-09-30 HISTORY — PX: CARDIOVERSION: SHX1299

## 2015-09-30 SURGERY — CARDIOVERSION
Anesthesia: General

## 2015-09-30 MED ORDER — LIDOCAINE HCL (CARDIAC) 20 MG/ML IV SOLN
INTRAVENOUS | Status: DC | PRN
  Administered 2015-09-30: 60 mg via INTRAVENOUS

## 2015-09-30 MED ORDER — PROPOFOL 10 MG/ML IV BOLUS
INTRAVENOUS | Status: DC | PRN
  Administered 2015-09-30: 50 mg via INTRAVENOUS

## 2015-09-30 MED ORDER — SODIUM CHLORIDE 0.9 % IV SOLN
INTRAVENOUS | Status: DC
  Administered 2015-09-30: 500 mL via INTRAVENOUS
  Administered 2015-09-30: 08:00:00 via INTRAVENOUS

## 2015-09-30 NOTE — Anesthesia Preprocedure Evaluation (Addendum)
Anesthesia Evaluation  Patient identified by MRN, date of birth, ID band Patient awake    Reviewed: Allergy & Precautions, H&P , NPO status , Patient's Chart, lab work & pertinent test results, reviewed documented beta blocker date and time   Airway Mallampati: III  TM Distance: >3 FB Neck ROM: Full    Dental no notable dental hx. (+) Teeth Intact, Dental Advisory Given   Pulmonary neg pulmonary ROS,    Pulmonary exam normal breath sounds clear to auscultation       Cardiovascular hypertension, Pt. on medications and Pt. on home beta blockers + CAD, + Past MI, + CABG and + Peripheral Vascular Disease   Rhythm:Irregular Rate:Normal     Neuro/Psych negative neurological ROS  negative psych ROS   GI/Hepatic Neg liver ROS, GERD  Controlled,  Endo/Other  diabetesHypothyroidism   Renal/GU negative Renal ROS  negative genitourinary   Musculoskeletal   Abdominal   Peds  Hematology negative hematology ROS (+)   Anesthesia Other Findings   Reproductive/Obstetrics negative OB ROS                            Anesthesia Physical Anesthesia Plan  ASA: III  Anesthesia Plan: General   Post-op Pain Management:    Induction: Intravenous  Airway Management Planned: Mask  Additional Equipment:   Intra-op Plan:   Post-operative Plan:   Informed Consent: I have reviewed the patients History and Physical, chart, labs and discussed the procedure including the risks, benefits and alternatives for the proposed anesthesia with the patient or authorized representative who has indicated his/her understanding and acceptance.   Dental advisory given  Plan Discussed with: CRNA  Anesthesia Plan Comments:         Anesthesia Quick Evaluation

## 2015-09-30 NOTE — Discharge Instructions (Signed)
You have an appointment set up with the [facility].  Multiple studies have shown that being followed by a dedicated atrial fibrillation clinic in addition to the standard care you receive from your other physicians improves health. We believe that enrollment in the atrial fibrillation clinic will allow [place] to better care for you.   The phone number to the [facility] is [phone]. The clinic is staffed Monday through Friday from 8:30am to 5pm.  Parking Directions: The clinic is located in the Heart and Vascular Building connected to [facility]. 1)From [address] turn on to [address] and go to the 3rd entrance  (Heart and Vascular entrance) on the right. 2)Look to the right for Heart &Vascular Parking Garage. 3)A code for the entrance is required please call the clinic to receive this.   4)Take the elevators to the 1st floor. Registration is in the room with the glass walls at the end of the hallway.  If you have any trouble parking or locating the clinic, please dont hesitate to call [phone].    Electrical Cardioversion, Care After Refer to this sheet in the next few weeks. These instructions provide you with information on caring for yourself after your procedure. Your health care provider may also give you more specific instructions. Your treatment has been planned according to current medical practices, but problems sometimes occur. Call your health care provider if you have any problems or questions after your procedure. WHAT TO EXPECT AFTER THE PROCEDURE After your procedure, it is typical to have the following sensations:  Some redness on the skin where the shocks were delivered. If this is tender, a sunburn lotion or hydrocortisone cream may help.  Possible return of an abnormal heart rhythm within hours or days after the procedure. HOME CARE INSTRUCTIONS  Take medicines only as directed by your health care provider. Be sure you  understand how and when to take your medicine.  Learn how to feel your pulse and check it often.  Limit your activity for 48 hours after the procedure or as directed by your health care provider.  Avoid or minimize caffeine and other stimulants as directed by your health care provider. SEEK MEDICAL CARE IF:  You feel like your heart is beating too fast or your pulse is not regular.  You have any questions about your medicines.  You have bleeding that will not stop. SEEK IMMEDIATE MEDICAL CARE IF:  You are dizzy or feel faint.  It is hard to breathe or you feel short of breath.  There is a change in discomfort in your chest.  Your speech is slurred or you have trouble moving an arm or leg on one side of your body.  You get a serious muscle cramp that does not go away.  Your fingers or toes turn cold or blue.   This information is not intended to replace advice given to you by your health care provider. Make sure you discuss any questions you have with your health care provider.   Document Released: 05/10/2013 Document Revised: 08/10/2014 Document Reviewed: 05/10/2013 Elsevier Interactive Patient Education [employer].

## 2015-09-30 NOTE — Interval H&P Note (Signed)
History and Physical Interval Note:  09/30/2015 8:09 AM  [place]  has presented today for surgery, with the diagnosis of AFIB  The various methods of treatment have been discussed with the patient and family. After consideration of risks, benefits and other options for treatment, the patient has consented to  Procedure(s): CARDIOVERSION (N/A) as a surgical intervention .  The patient's history has been reviewed, patient examined, no change in status, stable for surgery.  I have reviewed the patient's chart and labs.  Questions were answered to the patient's satisfaction.     Dorris Carnes

## 2015-09-30 NOTE — Transfer of Care (Signed)
Immediate Anesthesia Transfer of Care Note  Patient: Lindsay Nguyen  Procedure(s) Performed: Procedure(s): CARDIOVERSION (N/A)  Patient Location: Endoscopy Unit  Anesthesia Type:General  Level of Consciousness: awake, alert  and oriented  Airway & Oxygen Therapy: Patient Spontanous Breathing  Post-op Assessment: Report given to RN, Post -op Vital signs reviewed and stable and Patient moving all extremities X 4  Post vital signs: Reviewed and stable  Last Vitals:  Filed Vitals:   09/30/15 0757  BP: 169/92  Pulse: 65  Temp: 36.5 C  Resp: 17    Complications: No apparent anesthesia complications

## 2015-09-30 NOTE — Op Note (Signed)
Patient anesthetized by anesthesia with Propofol and lidocaine With pads in AP position, pt cardioverted to SB with 200 J synchronized biphasic energy. Procedure without complication 12 lead EKG pending

## 2015-09-30 NOTE — H&P (View-Only) (Signed)
Mrs. Lindsay Nguyen returns today for follow-up of [name]. I performed an event monitor that showed tachybradycardia syndrome with persistent [name] and a 2-D echo which was essentially normal. Her left atrium was mildly dilated. She has been on Eliquis oral anticoagulation for over 4 weeks. I'm going to obtain an outpatient sleep study and arrange for her to undergo outpatient DC cardioversion in the next 2 weeks. The patient and her family are agreeable with this approach.

## 2015-09-30 NOTE — Interval H&P Note (Signed)
History and Physical Interval Note:  09/30/2015 8:09 AM  [place]  has presented today for surgery, with the diagnosis of AFIB  The various methods of treatment have been discussed with the patient and family. After consideration of risks, benefits and other options for treatment, the patient has consented to  Procedure(s): CARDIOVERSION (N/A) as a surgical intervention .  The patient's history has been reviewed, patient examined, no change in status, stable for surgery.  I have reviewed the patient's chart and labs.  Questions were answered to the patient's satisfaction.     Dorris Carnes

## 2015-09-30 NOTE — Anesthesia Postprocedure Evaluation (Signed)
Anesthesia Post Note  Patient: [name]  Procedure(s) Performed: Procedure(s) (LRB): CARDIOVERSION (N/A)  Patient location during evaluation: PACU Anesthesia Type: General Level of consciousness: awake and alert Pain management: pain level controlled Vital Signs Assessment: post-procedure vital signs reviewed and stable Respiratory status: spontaneous breathing, nonlabored ventilation and respiratory function stable Cardiovascular status: blood pressure returned to baseline and stable Postop Assessment: no signs of nausea or vomiting Anesthetic complications: no    Last Vitals:  Filed Vitals:   09/30/15 0850 09/30/15 0900  BP: 168/85   Pulse: 54 51  Temp:    Resp: 16 13    Last Pain: There were no vitals filed for this visit.               Garry Nicolini,W. EDMOND

## 2015-10-01 ENCOUNTER — Encounter (HOSPITAL_COMMUNITY): Payer: Self-pay | Admitting: Internal Medicine

## 2015-10-03 ENCOUNTER — Ambulatory Visit (HOSPITAL_COMMUNITY)
Admission: RE | Admit: 2015-10-03 | Discharge: 2015-10-03 | Disposition: A | Discharge status: Home / Self Care | Payer: Medicare Other | Source: Ambulatory Visit | Attending: Nurse Practitioner | Admitting: Nurse Practitioner

## 2015-10-03 ENCOUNTER — Encounter (HOSPITAL_COMMUNITY): Payer: Self-pay | Admitting: Nurse Practitioner

## 2015-10-03 VITALS — BP 142/78 | HR 61 | Ht 68.0 in | Wt 209.8 lb

## 2015-10-03 DIAGNOSIS — Z79899 Other long term (current) drug therapy: Secondary | ICD-10-CM | POA: Diagnosis not present

## 2015-10-03 DIAGNOSIS — E039 Hypothyroidism, unspecified: Secondary | ICD-10-CM | POA: Diagnosis not present

## 2015-10-03 DIAGNOSIS — I481 Persistent atrial fibrillation: Secondary | ICD-10-CM

## 2015-10-03 DIAGNOSIS — Z7902 Long term (current) use of antithrombotics/antiplatelets: Secondary | ICD-10-CM | POA: Insufficient documentation

## 2015-10-03 DIAGNOSIS — M8580 Other specified disorders of bone density and structure, unspecified site: Secondary | ICD-10-CM | POA: Diagnosis not present

## 2015-10-03 DIAGNOSIS — Z7982 Long term (current) use of aspirin: Secondary | ICD-10-CM | POA: Diagnosis not present

## 2015-10-03 DIAGNOSIS — Z888 Allergy status to other drugs, medicaments and biological substances status: Secondary | ICD-10-CM | POA: Diagnosis not present

## 2015-10-03 DIAGNOSIS — I2510 Atherosclerotic heart disease of native coronary artery without angina pectoris: Secondary | ICD-10-CM | POA: Diagnosis not present

## 2015-10-03 DIAGNOSIS — E785 Hyperlipidemia, unspecified: Secondary | ICD-10-CM | POA: Diagnosis not present

## 2015-10-03 DIAGNOSIS — E119 Type 2 diabetes mellitus without complications: Secondary | ICD-10-CM | POA: Insufficient documentation

## 2015-10-03 DIAGNOSIS — K219 Gastro-esophageal reflux disease without esophagitis: Secondary | ICD-10-CM | POA: Diagnosis not present

## 2015-10-03 DIAGNOSIS — I4819 Other persistent atrial fibrillation: Secondary | ICD-10-CM

## 2015-10-03 DIAGNOSIS — Z91041 Radiographic dye allergy status: Secondary | ICD-10-CM | POA: Diagnosis not present

## 2015-10-03 DIAGNOSIS — Z823 Family history of stroke: Secondary | ICD-10-CM | POA: Insufficient documentation

## 2015-10-03 DIAGNOSIS — I4891 Unspecified atrial fibrillation: Secondary | ICD-10-CM | POA: Diagnosis not present

## 2015-10-03 DIAGNOSIS — Z951 Presence of aortocoronary bypass graft: Secondary | ICD-10-CM | POA: Diagnosis not present

## 2015-10-03 DIAGNOSIS — I10 Essential (primary) hypertension: Secondary | ICD-10-CM | POA: Diagnosis not present

## 2015-10-03 DIAGNOSIS — Z8249 Family history of ischemic heart disease and other diseases of the circulatory system: Secondary | ICD-10-CM | POA: Diagnosis not present

## 2015-10-03 DIAGNOSIS — Z7984 Long term (current) use of oral hypoglycemic drugs: Secondary | ICD-10-CM | POA: Diagnosis not present

## 2015-10-03 DIAGNOSIS — Z7983 Long term (current) use of bisphosphonates: Secondary | ICD-10-CM | POA: Insufficient documentation

## 2015-10-03 DIAGNOSIS — I252 Old myocardial infarction: Secondary | ICD-10-CM | POA: Diagnosis not present

## 2015-10-03 NOTE — Progress Notes (Signed)
Patient ID: Lindsay Nguyen, female   DOB: Jun 13, 1936, 80 y.o.   MRN: [id number]     Primary Care Physician: Zollie Scale, MD Referring Physician: [facility] f/u    [place] is a 80 y.o. female with a h/o  afib s/p DCCV 2/27. She was asked to f/u in afib clinic for heart rate in the low 50's right after cardioversion. Today, she feels well, denies being lightheaded or dizzy. V rate 61 bpm.  P waves are not very obvious but feel pt is in SR with first degree AVB.Marland Kitchen Denies any awareness of any afib since cardioversion. H/o CAD s/p bypass but no chest pain complaints. Chadsvasc score of 9, and reminded pt that she will have to stay on blood thinners long term to lower stroke risk. Denies shortness of breath with activities even when she was in afib, but does not thinks her energy is that much improved since DCCV.  Today, she denies symptoms of palpitations, chest pain, shortness of breath, orthopnea, PND, lower extremity edema, dizziness, presyncope, syncope, or neurologic sequela. The patient is tolerating medications without difficulties and is otherwise without complaint today.   Past Medical History  Diagnosis Date  . Anemia   . Diabetes mellitus   . Hyperlipidemia   . Hypertension   . Thyroid disease     hypothyroid  . GERD (gastroesophageal reflux disease)   . Osteopenia   . Pain in limb     LEA VENOUS DUPLEX, 06/27/2010 - no evidence of DVT  . Non-STEMI (non-ST elevated myocardial infarction) (HCC)     NUCLEAR STRESS TEST, 11/04/2010 - normal, no ischemia  . CAD (coronary artery disease)     2D ECHO, 11/04/2010 - EF >55%, mild-moderate mitral regurgitation, moderate tricuspid regurgitation  . Thoracic aortic aneurysm (HCC)     status post resection and grafting  . Atrial fibrillation ([place])     persistent   Past Surgical History  Procedure Laterality Date  . Tubal ligation  1971  . Tonsillectomy  1945  . Elbow surgery  1979    fracture left  . Cardiac catheterization  06/18/2010      Recommended CABG  . Coronary artery bypass graft  06/18/2010    LIMA-LAD, vein-diagonal branch, vein-obtuse marginal branch, and resectioning and grafting of throacic aortic aneurysm  . Cholecystectomy    . Cardioversion N/A 09/30/2015    Procedure: CARDIOVERSION;  Surgeon: [name], MD;  Location: [facility] ENDOSCOPY;  Service: Cardiovascular;  Laterality: N/A;    Current Outpatient Prescriptions  Medication Sig Dispense Refill  . alendronate (FOSAMAX) 70 MG tablet Take 70 mg by mouth every Sunday. Take with a full glass of water on an empty stomach.    Marland Kitchen amLODipine (NORVASC) 2.5 MG tablet Take 2.5 mg by mouth daily.    Marland Kitchen apixaban (ELIQUIS) 5 MG TABS tablet Take 1 tablet (5 mg total) by mouth 2 (two) times daily. 180 tablet 2  . aspirin EC 81 MG tablet Take 1 tablet (81 mg total) by mouth daily. 90 tablet 3  . atorvastatin (LIPITOR) 10 MG tablet Take 10 mg by mouth daily at 6 PM.    . budesonide (PULMICORT) 180 MCG/ACT inhaler Inhale 2 puffs into the lungs daily at 12 noon.     . carvedilol (COREG) 12.5 MG tablet TAKE 1&1/2 TABLETS BY MOUTH EVERY MORNING AND 1 TABLET EVERY EVENING (Patient taking differently: Take 18.75 mg by mouth daily in the morning and Take 12.5 mg by mouth in the evening)  225 tablet 0  . escitalopram (LEXAPRO) 10 MG tablet Take 10 mg by mouth daily.    . furosemide (LASIX) 40 MG tablet TAKE 1 TABLET DAILY (Patient taking differently: TAKE 1 TABLET DAILY AS NEEDED FOR FLUID) 90 tablet 2  . KOMBIGLYZE XR 12-998 MG TB24 Take 1 tablet by mouth at bedtime.  0  . levalbuterol (XOPENEX HFA) 45 MCG/ACT inhaler Inhale 1-2 puffs into the lungs every 4 (four) hours as needed for wheezing or shortness of breath.     . levothyroxine (SYNTHROID, LEVOTHROID) 112 MCG tablet Take 56 mcg by mouth daily before breakfast. 1/2 tab daily, BRAND only per Dr palel    . montelukast (SINGULAIR) 10 MG tablet Take 10 mg by mouth at bedtime.    . naproxen sodium (ANAPROX) 220 MG tablet Take 220 mg  by mouth as needed (PAIN, HEADACHE).     . nitroGLYCERIN (NITROSTAT) 0.4 MG SL tablet Place 0.4 mg under the tongue every 5 (five) minutes as needed for chest pain.     . traZODone (DESYREL) 50 MG tablet Take 50 mg by mouth at bedtime as needed.  0  . valsartan (DIOVAN) 160 MG tablet Take 1 tablet (160 mg total) by mouth daily. 90 tablet 3  . vitamin E 100 UNIT capsule Take 100 Units by mouth 2 (two) times daily.     No current facility-administered medications for this encounter.    Allergies  Allergen Reactions  . Iodine Anaphylaxis  . Contrast Media [Iodinated Diagnostic Agents]   . Gabapentin Hives  . Shellfish Allergy     Social History   Social History  . Marital Status: Married    Spouse Name: N/A  . Number of Children: N/A  . Years of Education: N/A   Occupational History  . Not on file.   Social History Main Topics  . Smoking status: Never Smoker   . Smokeless tobacco: Never Used  . Alcohol Use: No  . Drug Use: No  . Sexual Activity: Not on file   Other Topics Concern  . Not on file   Social History Narrative    Family History  Problem Relation Age of Onset  . Hypertension Mother   . Osteoporosis Mother   . Heart disease Father   . Stroke Father   . Alzheimer's disease Father     ROS- All systems are reviewed and negative except as per the HPI above  Physical Exam: Filed Vitals:   10/03/15 1055  BP: 142/78  Pulse: 61  Height: 5\' 8"  (1.727 m)  Weight: 209 lb 12.8 oz (95.165 kg)    GEN- The patient is well appearing, alert and oriented x 3 today.   Head- normocephalic, atraumatic Eyes-  Sclera clear, conjunctiva pink Ears- hearing intact Oropharynx- clear Neck- supple, no JVP Lymph- no cervical lymphadenopathy Lungs- Clear to ausculation bilaterally, normal work of breathing Heart- Regular rate and rhythm, no murmurs, rubs or gallops, PMI not laterally displaced GI- soft, NT, ND, + BS Extremities- no clubbing, cyanosis, or edema MS- no  significant deformity or atrophy Skin- no rash or lesion Psych- euthymic mood, full affect Neuro- strength and sensation are intact  EKG-wide qrs rhtym, suspect sr with first degree AVB, LAFB, LVH qrs int 122 ms, qtc 440 ms Epic records reviewed  Assessment and Plan: 1.afib Successful DCCV Loletha Grayer resolved Continue CCB/BB Continue apixaban for chadsvasc score of at least 8  F/u with Dr. Gwenlyn Found as scheduled, afib clinic as needed  Butch Penny C. Kayleen Memos, ANP-C Afib  [facility] [address] [place], [place] [address] [phone]

## 2015-10-15 DIAGNOSIS — Z951 Presence of aortocoronary bypass graft: Secondary | ICD-10-CM | POA: Diagnosis not present

## 2015-10-15 DIAGNOSIS — R509 Fever, unspecified: Secondary | ICD-10-CM | POA: Diagnosis not present

## 2015-10-15 DIAGNOSIS — I252 Old myocardial infarction: Secondary | ICD-10-CM | POA: Diagnosis not present

## 2015-10-15 DIAGNOSIS — Z79899 Other long term (current) drug therapy: Secondary | ICD-10-CM | POA: Diagnosis not present

## 2015-10-15 DIAGNOSIS — J90 Pleural effusion, not elsewhere classified: Secondary | ICD-10-CM | POA: Diagnosis not present

## 2015-10-15 DIAGNOSIS — I5033 Acute on chronic diastolic (congestive) heart failure: Secondary | ICD-10-CM | POA: Diagnosis not present

## 2015-10-15 DIAGNOSIS — F329 Major depressive disorder, single episode, unspecified: Secondary | ICD-10-CM | POA: Diagnosis present

## 2015-10-15 DIAGNOSIS — J9690 Respiratory failure, unspecified, unspecified whether with hypoxia or hypercapnia: Secondary | ICD-10-CM | POA: Diagnosis not present

## 2015-10-15 DIAGNOSIS — J45909 Unspecified asthma, uncomplicated: Secondary | ICD-10-CM | POA: Diagnosis not present

## 2015-10-15 DIAGNOSIS — E039 Hypothyroidism, unspecified: Secondary | ICD-10-CM | POA: Diagnosis present

## 2015-10-15 DIAGNOSIS — E119 Type 2 diabetes mellitus without complications: Secondary | ICD-10-CM | POA: Diagnosis present

## 2015-10-15 DIAGNOSIS — J209 Acute bronchitis, unspecified: Secondary | ICD-10-CM | POA: Diagnosis not present

## 2015-10-15 DIAGNOSIS — Z888 Allergy status to other drugs, medicaments and biological substances status: Secondary | ICD-10-CM | POA: Diagnosis not present

## 2015-10-15 DIAGNOSIS — I083 Combined rheumatic disorders of mitral, aortic and tricuspid valves: Secondary | ICD-10-CM | POA: Diagnosis present

## 2015-10-15 DIAGNOSIS — J449 Chronic obstructive pulmonary disease, unspecified: Secondary | ICD-10-CM | POA: Diagnosis present

## 2015-10-15 DIAGNOSIS — Z955 Presence of coronary angioplasty implant and graft: Secondary | ICD-10-CM | POA: Diagnosis not present

## 2015-10-15 DIAGNOSIS — Z91013 Allergy to seafood: Secondary | ICD-10-CM | POA: Diagnosis not present

## 2015-10-15 DIAGNOSIS — J9601 Acute respiratory failure with hypoxia: Secondary | ICD-10-CM | POA: Diagnosis not present

## 2015-10-15 DIAGNOSIS — I2510 Atherosclerotic heart disease of native coronary artery without angina pectoris: Secondary | ICD-10-CM | POA: Diagnosis present

## 2015-10-15 DIAGNOSIS — Z91048 Other nonmedicinal substance allergy status: Secondary | ICD-10-CM | POA: Diagnosis not present

## 2015-10-15 DIAGNOSIS — I480 Paroxysmal atrial fibrillation: Secondary | ICD-10-CM | POA: Diagnosis present

## 2015-10-15 DIAGNOSIS — I517 Cardiomegaly: Secondary | ICD-10-CM | POA: Diagnosis not present

## 2015-10-15 DIAGNOSIS — I272 Other secondary pulmonary hypertension: Secondary | ICD-10-CM | POA: Diagnosis present

## 2015-10-15 DIAGNOSIS — R911 Solitary pulmonary nodule: Secondary | ICD-10-CM | POA: Diagnosis present

## 2015-10-15 DIAGNOSIS — E785 Hyperlipidemia, unspecified: Secondary | ICD-10-CM | POA: Diagnosis present

## 2015-10-15 DIAGNOSIS — I110 Hypertensive heart disease with heart failure: Secondary | ICD-10-CM | POA: Diagnosis present

## 2015-10-15 DIAGNOSIS — R0989 Other specified symptoms and signs involving the circulatory and respiratory systems: Secondary | ICD-10-CM | POA: Diagnosis not present

## 2015-10-15 DIAGNOSIS — I509 Heart failure, unspecified: Secondary | ICD-10-CM | POA: Diagnosis not present

## 2015-10-16 ENCOUNTER — Telehealth: Payer: Self-pay

## 2015-10-16 NOTE — Telephone Encounter (Signed)
Pt daughter called as pt was admitted at the [facility] with SOB and weakness. Pt labs: troponin, BNP, and D-dimer were normal. Pt had recent cardioversion at end of Feb. And was noted during hospitalization that she was in rate controlled [name]. During visit pt got a limited Echo and was noted to be in severe pulm. Edema; which is a new change since Echo completed in Jan.  This is something that is new to pt and worried that she is now in heart failure; pt and are very worried about new diagnosis. Pt has f/u scheduled with Dr. Gwenlyn Found on 5/19 but family and patient feel they she need to be evaluated sooner by cardiology.  Scheduled pt to see PA, [place] on 3/21 @ 8:30am; pt daughter aware, agreed with appointment. Did not cancel May appointment with Dr. Gwenlyn Found.

## 2015-10-22 ENCOUNTER — Encounter: Payer: Self-pay | Admitting: Cardiology

## 2015-10-22 ENCOUNTER — Ambulatory Visit (INDEPENDENT_AMBULATORY_CARE_PROVIDER_SITE_OTHER): Payer: Medicare Other | Admitting: Cardiology

## 2015-10-22 VITALS — BP 120/50 | HR 60 | Ht 68.0 in | Wt 203.4 lb

## 2015-10-22 DIAGNOSIS — Z951 Presence of aortocoronary bypass graft: Secondary | ICD-10-CM | POA: Diagnosis not present

## 2015-10-22 DIAGNOSIS — R0602 Shortness of breath: Secondary | ICD-10-CM

## 2015-10-22 DIAGNOSIS — I5031 Acute diastolic (congestive) heart failure: Secondary | ICD-10-CM

## 2015-10-22 DIAGNOSIS — I4891 Unspecified atrial fibrillation: Secondary | ICD-10-CM | POA: Diagnosis not present

## 2015-10-22 DIAGNOSIS — R001 Bradycardia, unspecified: Secondary | ICD-10-CM | POA: Insufficient documentation

## 2015-10-22 DIAGNOSIS — J449 Chronic obstructive pulmonary disease, unspecified: Secondary | ICD-10-CM

## 2015-10-22 DIAGNOSIS — I480 Paroxysmal atrial fibrillation: Secondary | ICD-10-CM | POA: Diagnosis not present

## 2015-10-22 DIAGNOSIS — I10 Essential (primary) hypertension: Secondary | ICD-10-CM

## 2015-10-22 DIAGNOSIS — Z7901 Long term (current) use of anticoagulants: Secondary | ICD-10-CM | POA: Insufficient documentation

## 2015-10-22 DIAGNOSIS — J45909 Unspecified asthma, uncomplicated: Secondary | ICD-10-CM

## 2015-10-22 LAB — BASIC METABOLIC PANEL
BUN: 21 mg/dL (ref 7–25)
CO2: 26 mmol/L (ref 20–31)
Calcium: 9.2 mg/dL (ref 8.6–10.4)
Chloride: 100 mmol/L (ref 98–110)
Creat: 0.94 mg/dL — ABNORMAL HIGH (ref 0.60–0.93)
Glucose, Bld: 311 mg/dL — ABNORMAL HIGH (ref 65–99)
Potassium: 4.3 mmol/L (ref 3.5–5.3)
Sodium: 138 mmol/L (ref 135–146)

## 2015-10-22 NOTE — Assessment & Plan Note (Signed)
Recent admission was apparently mixed COPD exacerbation and diastolic CHF

## 2015-10-22 NOTE — Assessment & Plan Note (Signed)
Controlled.  

## 2015-10-22 NOTE — Patient Instructions (Signed)
Medication Instructions:  None  Labwork: BNP and BMP today  Testing/Procedures: None  Follow-Up: Your physician recommends that you schedule a follow-up appointment within 1-2 weeks WITH Dr. Gwenlyn Found per [name], PA-C.   Any Other Special Instructions Will Be Listed Below (If Applicable).     If you need a refill on your cardiac medications before your next appointment, please call your pharmacy.

## 2015-10-22 NOTE — Progress Notes (Signed)
10/22/2015 [place]   07-09-1936  [id number]  Primary Physician PATEL, Regis Bill, MD Primary Cardiologist: Dr Gwenlyn Found  HPI:  80 year old, female accompanied by her son and daughter. Her son was an Therapist, sports on unit 2000 at [employer] and her daughter is a NP in [place]. She is oin my schedule today as an add on after a recent admission in [place] for CHF.   The pt had a non-ST-segment-elevation myocardial infarction March 21, 2010 and had an LAD BMS placed.  Because of a generous thoracic aorta on cath she was seen by Dr. Gilford Raid for evaluation. She was cath'd later in November revealing aggressive "in-stent restenosis" within the proximal LAD stent, as well as in her diagonal branch and AV groove circumflex.   She ultimately underwent coronary artery bypass grafting by Dr. Gilford Raid with LIMA to her LAD, a vein to a diagonal branch, as well as obtuse marginal branch with resection and grafting of her thoracic aortic aneurysm. She had a Myoview performed November 04, 2010, which was nonischemic.   Recently she has had problems with atrial fibrillation. She had a DCCV 09/30/15 to NSR. She has been bradycardic since with HR in the low 50's. She was seen in the AF clinic 10/03/15 and her medications were not adjusted. She was admitted to the hospital in [facility] 10/15/15-10/17/15 with acute respiratory failure which apparently was a combination of COPD exacerbation and diastolic CHF. Her wgt was 216 on admission- 203 today.  When she was in the AF clinic 10/03/15 her wgt was 209.   She is better but still weak. Her daughter is concerned that an echo done at [facility] showed pulmonary HTN with a PA pressure of 60 mmHg. This was not seen on her echo in Jan and she thinks "we are missing something".    Current Outpatient Prescriptions  Medication Sig Dispense Refill  . alendronate (FOSAMAX) 70 MG tablet Take 70 mg by mouth every Sunday. Take with a full glass of water on an empty stomach.    Marland Kitchen  amLODipine (NORVASC) 2.5 MG tablet Take 2.5 mg by mouth daily.    Marland Kitchen apixaban (ELIQUIS) 5 MG TABS tablet Take 1 tablet (5 mg total) by mouth 2 (two) times daily. 180 tablet 2  . aspirin EC 81 MG tablet Take 1 tablet (81 mg total) by mouth daily. 90 tablet 3  . atorvastatin (LIPITOR) 10 MG tablet Take 10 mg by mouth daily at 6 PM.    . budesonide (PULMICORT) 180 MCG/ACT inhaler Inhale 2 puffs into the lungs daily at 12 noon.     . carvedilol (COREG) 12.5 MG tablet TAKE 1&1/2 TABLETS BY MOUTH EVERY MORNING AND 1 TABLET EVERY EVENING (Patient taking differently: Take 18.75 mg by mouth daily in the morning and Take 12.5 mg by mouth in the evening) 225 tablet 0  . escitalopram (LEXAPRO) 10 MG tablet Take 10 mg by mouth daily.    . furosemide (LASIX) 40 MG tablet TAKE 1 TABLET DAILY (Patient taking differently: TAKE 1 TABLET DAILY AS NEEDED FOR FLUID) 90 tablet 2  . KOMBIGLYZE XR 12-998 MG TB24 Take 1 tablet by mouth at bedtime.  0  . levalbuterol (XOPENEX HFA) 45 MCG/ACT inhaler Inhale 1-2 puffs into the lungs every 4 (four) hours as needed for wheezing or shortness of breath.     . levothyroxine (SYNTHROID, LEVOTHROID) 112 MCG tablet Take 56 mcg by mouth daily before breakfast. 1/2 tab daily, BRAND only per Dr palel    .  montelukast (SINGULAIR) 10 MG tablet Take 10 mg by mouth at bedtime.    . naproxen sodium (ANAPROX) 220 MG tablet Take 220 mg by mouth as needed (PAIN, HEADACHE).     . nitroGLYCERIN (NITROSTAT) 0.4 MG SL tablet Place 0.4 mg under the tongue every 5 (five) minutes as needed for chest pain.     . traZODone (DESYREL) 50 MG tablet Take 50 mg by mouth at bedtime as needed.  0  . valsartan (DIOVAN) 160 MG tablet Take 1 tablet (160 mg total) by mouth daily. 90 tablet 3  . vitamin E 100 UNIT capsule Take 100 Units by mouth 2 (two) times daily.     No current facility-administered medications for this visit.    Allergies  Allergen Reactions  . Iodine Anaphylaxis and Swelling  . Contrast  Media [Iodinated Diagnostic Agents]   . Gabapentin Hives  . Shellfish Allergy     Social History   Social History  . Marital Status: Married    Spouse Name: N/A  . Number of Children: N/A  . Years of Education: N/A   Occupational History  . Not on file.   Social History Main Topics  . Smoking status: Never Smoker   . Smokeless tobacco: Never Used  . Alcohol Use: No  . Drug Use: No  . Sexual Activity: Not on file   Other Topics Concern  . Not on file   Social History Narrative     Review of Systems: General: negative for chills, fever, night sweats or weight changes.  Cardiovascular: negative for chest pain, dyspnea on exertion, edema, orthopnea, palpitations, paroxysmal nocturnal dyspnea or shortness of breath Dermatological: negative for rash Respiratory: negative for cough or wheezing Urologic: negative for hematuria Abdominal: negative for nausea, vomiting, diarrhea, bright red blood per rectum, melena, or hematemesis Neurologic: negative for visual changes, syncope, or dizziness All other systems reviewed and are otherwise negative except as noted above.    Blood pressure 120/50, pulse 60, height 5\' 8"  (1.727 m), weight 203 lb 6.4 oz (92.262 kg).  General appearance: alert, cooperative, no distress, mildly obese and pale Neck: no carotid bruit and no JVD Lungs: few crackles Lt base Heart: regular rate and rhythm Extremities: no edema Skin: warm and dry Neurologic: Grossly normal  EKG NSR, SB 52, 1st degree AVB  ASSESSMENT AND PLAN:   Acute diastolic (congestive) heart failure (HCC) Hospitalized in [facility] 3/14-3/16/17  PAF (paroxysmal atrial fibrillation) ([place]) DCCV 09/20/15- NSR, sinus bradycardia today. CHADs VASc=8  Hx of CABG Hx of CABG x 3 with thoracic aneurysm repair 2011 Normal Myoview 2012  Bradycardia, sinus She has SSS component- HR 51 with 1st degree AVB today  Essential hypertension Controlled  Chronic  anticoagulation Eliquis  COPD with asthma ([place]) Recent admission was apparently mixed COPD exacerbation and diastolic CHF    PLAN  I had a long discussion with the pt and family. I told them I could not definitively explain the echo finding from her hospitalization but I speculated it may have been done when she was in CHF which could have given her a high PA pressure reading. The pt did not appear to be in CHF today. I think her bradycardia is significant enough to possibly cause her some fatigue. I suggested a BMP and BNP today- it may be her dry wgt is closer to 200. Otherwise I did not change her medications. She did have PAF with RVR while on telemetry at [facility].  I'll try and get her in to  see Dr Gwenlyn Found ASAP.   Stacy Sailer K PA-C 10/22/2015 10:02 AM

## 2015-10-22 NOTE — Assessment & Plan Note (Addendum)
DCCV 09/20/15- NSR, sinus bradycardia today. CHADs VASc=8

## 2015-10-22 NOTE — Assessment & Plan Note (Signed)
Hospitalized in [place] 3/14-3/16/17

## 2015-10-22 NOTE — Assessment & Plan Note (Signed)
She has SSS component- HR 51 with 1st degree AVB today

## 2015-10-22 NOTE — Assessment & Plan Note (Signed)
Hx of CABG x 3 with thoracic aneurysm repair 2011 Normal Myoview 2012

## 2015-10-22 NOTE — Assessment & Plan Note (Signed)
Eliquis 

## 2015-10-23 ENCOUNTER — Encounter: Payer: Self-pay | Admitting: Cardiovascular Disease

## 2015-10-23 LAB — BRAIN NATRIURETIC PEPTIDE: Brain Natriuretic Peptide: 180.2 pg/mL — ABNORMAL HIGH (ref <100)

## 2015-11-06 ENCOUNTER — Encounter: Payer: Self-pay | Admitting: Cardiovascular Disease

## 2015-11-06 ENCOUNTER — Ambulatory Visit (INDEPENDENT_AMBULATORY_CARE_PROVIDER_SITE_OTHER): Payer: Medicare Other | Admitting: Cardiovascular Disease

## 2015-11-06 VITALS — BP 146/64 | HR 60 | Ht 68.0 in | Wt 203.0 lb

## 2015-11-06 DIAGNOSIS — I739 Peripheral vascular disease, unspecified: Secondary | ICD-10-CM

## 2015-11-06 DIAGNOSIS — I480 Paroxysmal atrial fibrillation: Secondary | ICD-10-CM | POA: Diagnosis not present

## 2015-11-06 DIAGNOSIS — Z951 Presence of aortocoronary bypass graft: Secondary | ICD-10-CM

## 2015-11-06 DIAGNOSIS — I5031 Acute diastolic (congestive) heart failure: Secondary | ICD-10-CM | POA: Diagnosis not present

## 2015-11-06 NOTE — Assessment & Plan Note (Signed)
History of hyperlipidemia on statin therapy followed by her PCP. 

## 2015-11-06 NOTE — Patient Instructions (Signed)
Medication Instructions:  Your physician recommends that you continue on your current medications as directed. Please refer to the Current Medication list given to you today.   Labwork: None   Testing/Procedures: Your physician has requested that you have an echocardiogram. Echocardiography is a painless test that uses sound waves to create images of your heart. It provides your doctor with information about the size and shape of your heart and how well your heart's chambers and valves are working. This procedure takes approximately one hour. There are no restrictions for this procedure.    Follow-Up: We request that you follow-up in: 3 months with [name] with an extender and in 6 months with Dr Andria Rhein will receive a reminder letter in the mail two months in advance. If you don't receive a letter, please call our office to schedule the follow-up appointment.    Any Other Special Instructions Will Be Listed Below (If Applicable).     If you need a refill on your cardiac medications before your next appointment, please call your pharmacy.

## 2015-11-06 NOTE — Assessment & Plan Note (Signed)
History of coronary artery disease status post coronary artery stenting in the setting of non-ST segment elevation myocardial infarction 03/21/10.she ultimately underwent coronary artery bypass grafting X 3 by Dr. Arvid Right with a LIMA to LAD, vein to diagonal branch as well as obtuse marginal branch with resection and grafting of her thoracic aortic aneurysm using a 20 mm super coronary hemisphere to graft utilizing deep hypothermic circulatory arrest with resuspension of her native aortic valve and reimplantation of her native coronary arteries.her last Myoview performed 11/04/10 was normal.

## 2015-11-06 NOTE — Assessment & Plan Note (Signed)
History of paroxysmal atrial fibrillation status post DC cardioversion by Dr. Wyatt Haste at my request 09/30/15 successfully. She remains in sinus rhythm/sinus bradycardia today on oral anticoagulation. I'm partially, her fatigue is not improved. She scheduled to have an outpatient sleep study next week.

## 2015-11-06 NOTE — Assessment & Plan Note (Signed)
History of hypertension blood pressure measured at 146/64. She is on amlodipine and carvedilol as well as Diovan. Continue current meds at current dosing

## 2015-11-06 NOTE — Progress Notes (Signed)
11/06/2015 Lindsay Nguyen   Dec 13, 1935  [id number]  Primary Physician PATEL, Regis Bill, MD Primary Cardiologist: [name] MD Lindsay Nguyen   HPI:  The patient returns today for  Followup. I recently saw her in the office 09/20/15. She is a pleasant, 80 year old, mildly overweight, married Caucasian female, mother of 2, grandmother to 2 grandchildren accompanied by her son and daughter.. Her risk factors include family history, hypertension, hyperlipidemia and noninsulin-requiring diabetes. She had a non-ST-segment-elevation myocardial infarction March 21, 2010, with peak troponin of 4. I brought her to the cath lab the following day and stented her proximal LAD with a bare-metal stent. She did have nonobstructive disease beyond the stent as well as a diagonal branch, AV groove circumflex with an EF of 45% and anteroapical akinesia. Because of a generous thoracic aorta on cath, I performed a CT angiogram on her revealing her thoracic aorta which measured 4.9 cm. She was seen by Dr. Gilford Raid for evaluation of this at my request. Myoview performed June 11, 2010, showed apical and inferior ischemia as well as anterior ischemia. I noted her June 17, 2010, with rapid onset dyspnea relieved with sublingual nitroglycerin. The patient ruled out for myocardial infarction and was heparinized. I ultimately cath'd her November 16 revealing aggressive "in-stent restenosis" within the proximal LAD stent, as well as in her diagonal branch and AV groove circumflex.   She ultimately underwent coronary artery bypass grafting by Dr. Gilford Raid with LIMA to her LAD, a vein to a diagonal branch, as well as obtuse marginal branch with resection and grafting of her thoracic aortic aneurysm using a 20-mm supracoronary Hemashield 2 graft utilizing deep hypothermic circulatory arrest with resuspension of her native aortic valve and reimplantation of her native coronary arteries. Her postop course  was complicated and prolonged, lasting 4 weeks. She did have atrial fibrillation, pneumonia, cholecystitis status post cholecystectomy, as well as pleural effusions. She had a percutaneous drain of her gallbladder and ultimately cholecystectomy by Dr. Excell Seltzer September 18, 2010. She recuperated nicely. She had a Myoview performed November 04, 2010, which was nonischemic and a 2D echo that showed normal LV systolic function with a normal functioning aortic valve.  When I saw her last she was in atrial fibrillation which was confirmed on 30 day event monitoring with tachybradycardia syndrome. She was placed on oral anticoagulation and ultimately underwent successful outpatient cardioversion by Dr. Dorris Carnes 09/30/15. She remains in sinus rhythm/sinus bradycardia. She was hospitalized in [place] 10/15/15 for 2 days with upper respiratory tract infection. She did have diastolic heart failure that time with a BNP of greater than 2000 and was diuresed. A 2-D echo did show moderate to severe pulmonary hypertension without PA systolic pressure of 0000000 mmHg and septal flattening. Apparently a pulmonary embolus was ruled out at that time. She does feel clinically improved. Her major complaints continues to be fatigued. She is scheduled to have an outpatient sleep study next week.   Current Outpatient Prescriptions  Medication Sig Dispense Refill  . alendronate (FOSAMAX) 70 MG tablet Take 70 mg by mouth every Sunday. Take with a full glass of water on an empty stomach.    Marland Kitchen amLODipine (NORVASC) 2.5 MG tablet Take 2.5 mg by mouth daily.    Marland Kitchen apixaban (ELIQUIS) 5 MG TABS tablet Take 1 tablet (5 mg total) by mouth 2 (two) times daily. 180 tablet 2  . aspirin EC 81 MG tablet Take 1 tablet (81 mg total) by mouth  daily. 90 tablet 3  . atorvastatin (LIPITOR) 10 MG tablet Take 10 mg by mouth daily at 6 PM.    . budesonide (PULMICORT) 180 MCG/ACT inhaler Inhale 2 puffs into the lungs daily at 12 noon.     .  carvedilol (COREG) 12.5 MG tablet TAKE 1&1/2 TABLETS BY MOUTH EVERY MORNING AND 1 TABLET EVERY EVENING (Patient taking differently: Take 18.75 mg by mouth daily in the morning and Take 12.5 mg by mouth in the evening) 225 tablet 0  . escitalopram (LEXAPRO) 10 MG tablet Take 10 mg by mouth daily.    . furosemide (LASIX) 40 MG tablet TAKE 1 TABLET DAILY (Patient taking differently: TAKE 1 TABLET DAILY AS NEEDED FOR FLUID) 90 tablet 2  . KOMBIGLYZE XR 12-998 MG TB24 Take 1 tablet by mouth at bedtime.  0  . levalbuterol (XOPENEX HFA) 45 MCG/ACT inhaler Inhale 1-2 puffs into the lungs every 4 (four) hours as needed for wheezing or shortness of breath.     . levothyroxine (SYNTHROID, LEVOTHROID) 112 MCG tablet Take 56 mcg by mouth daily before breakfast. 1/2 tab daily, BRAND only per Dr palel    . montelukast (SINGULAIR) 10 MG tablet Take 10 mg by mouth at bedtime.    . naproxen sodium (ANAPROX) 220 MG tablet Take 220 mg by mouth as needed (PAIN, HEADACHE).     . nitroGLYCERIN (NITROSTAT) 0.4 MG SL tablet Place 0.4 mg under the tongue every 5 (five) minutes as needed for chest pain.     . traZODone (DESYREL) 50 MG tablet Take 50 mg by mouth at bedtime as needed.  0  . valsartan (DIOVAN) 160 MG tablet Take 1 tablet (160 mg total) by mouth daily. 90 tablet 3  . vitamin E 100 UNIT capsule Take 100 Units by mouth 2 (two) times daily.     No current facility-administered medications for this visit.    Allergies  Allergen Reactions  . Iodine Anaphylaxis and Swelling  . Contrast Media [Iodinated Diagnostic Agents]   . Gabapentin Hives  . Shellfish Allergy     Social History   Social History  . Marital Status: Married    Spouse Name: N/A  . Number of Children: N/A  . Years of Education: N/A   Occupational History  . Not on file.   Social History Main Topics  . Smoking status: Never Smoker   . Smokeless tobacco: Never Used  . Alcohol Use: No  . Drug Use: No  . Sexual Activity: Not on file    Other Topics Concern  . Not on file   Social History Narrative     Review of Systems: General: negative for chills, fever, night sweats or weight changes.  Cardiovascular: negative for chest pain, dyspnea on exertion, edema, orthopnea, palpitations, paroxysmal nocturnal dyspnea or shortness of breath Dermatological: negative for rash Respiratory: negative for cough or wheezing Urologic: negative for hematuria Abdominal: negative for nausea, vomiting, diarrhea, bright red blood per rectum, melena, or hematemesis Neurologic: negative for visual changes, syncope, or dizziness All other systems reviewed and are otherwise negative except as noted above.    Blood pressure 146/64, pulse 60, height 5\' 8"  (1.727 m), weight 203 lb (92.08 kg).  General appearance: alert and no distress Neck: no adenopathy, no carotid bruit, no JVD, supple, symmetrical, trachea midline and thyroid not enlarged, symmetric, no tenderness/mass/nodules Lungs: clear to auscultation bilaterally Heart: 2 every 6 systolic ejection murmur at the base consistent with aortic sclerosis Extremities: extremities normal, atraumatic, no cyanosis or  edema  EKG sinus bradycardia 59 with left bundle-branch block. I personally reviewed this EKG  ASSESSMENT AND PLAN:   DYSLIPIDEMIA History of hyperlipidemia on statin therapy followed by her PCP  Essential hypertension History of hypertension blood pressure measured at 146/64. She is on amlodipine and carvedilol as well as Diovan. Continue current meds at current dosing  Hx of CABG History of coronary artery disease status post coronary artery stenting in the setting of non-ST segment elevation myocardial infarction 03/21/10.she ultimately underwent coronary artery bypass grafting X 3 by Dr. Arvid Right with a LIMA to LAD, vein to diagonal branch as well as obtuse marginal branch with resection and grafting of her thoracic aortic aneurysm using a 20 mm super coronary  hemisphere to graft utilizing deep hypothermic circulatory arrest with resuspension of her native aortic valve and reimplantation of her native coronary arteries.her last Myoview performed 11/04/10 was normal.  PAF (paroxysmal atrial fibrillation) (HCC) History of paroxysmal atrial fibrillation status post DC cardioversion by Dr. Wyatt Haste at my request 09/30/15 successfully. She remains in sinus rhythm/sinus bradycardia today on oral anticoagulation. I'm partially, her fatigue is not improved. She scheduled to have an outpatient sleep study next week.  Acute diastolic (congestive) heart failure ([place]) Ms. Margaretmary Bayley [facility] for a viral upper respiratory tract infection and diastolic heart failure. Her BNP was elevated at greater than 2000. She was diuresed during her hospitalization. 2-D echo did reveal moderate to severe pulmonary hypertension with a PA SP of 60-70 mmHg. This was reviewed by Dr. Sallyanne Kuster as well. I'm going to repeat a 2-D echocardiogram. She did not have pulmonary hypertension at the time of echo in January of this year.      [name] MD FACP,FACC,FAHA, [facility] 11/06/2015 12:27 PM

## 2015-11-06 NOTE — Assessment & Plan Note (Signed)
Lindsay Nguyen was [facility] for a viral upper respiratory tract infection and diastolic heart failure. Her BNP was elevated at greater than 2000. She was diuresed during her hospitalization. 2-D echo did reveal moderate to severe pulmonary hypertension with a PA SP of 60-70 mmHg. This was reviewed by Dr. Sallyanne Kuster as well. I'm going to repeat a 2-D echocardiogram. She did not have pulmonary hypertension at the time of echo in January of this year.

## 2015-11-12 ENCOUNTER — Ambulatory Visit (HOSPITAL_BASED_OUTPATIENT_CLINIC_OR_DEPARTMENT_OTHER): Payer: Medicare Other | Attending: Cardiovascular Disease

## 2015-11-12 DIAGNOSIS — Z7901 Long term (current) use of anticoagulants: Secondary | ICD-10-CM | POA: Insufficient documentation

## 2015-11-12 DIAGNOSIS — G4736 Sleep related hypoventilation in conditions classified elsewhere: Secondary | ICD-10-CM | POA: Insufficient documentation

## 2015-11-12 DIAGNOSIS — Z7982 Long term (current) use of aspirin: Secondary | ICD-10-CM | POA: Diagnosis not present

## 2015-11-12 DIAGNOSIS — Z79899 Other long term (current) drug therapy: Secondary | ICD-10-CM | POA: Insufficient documentation

## 2015-11-12 DIAGNOSIS — R0683 Snoring: Secondary | ICD-10-CM | POA: Insufficient documentation

## 2015-11-12 DIAGNOSIS — G478 Other sleep disorders: Secondary | ICD-10-CM | POA: Insufficient documentation

## 2015-11-12 DIAGNOSIS — G4730 Sleep apnea, unspecified: Secondary | ICD-10-CM | POA: Insufficient documentation

## 2015-11-25 ENCOUNTER — Ambulatory Visit (HOSPITAL_COMMUNITY): Payer: Medicare Other | Attending: Cardiovascular Disease

## 2015-11-25 ENCOUNTER — Other Ambulatory Visit: Payer: Self-pay

## 2015-11-25 DIAGNOSIS — I110 Hypertensive heart disease with heart failure: Secondary | ICD-10-CM | POA: Diagnosis not present

## 2015-11-25 DIAGNOSIS — I739 Peripheral vascular disease, unspecified: Secondary | ICD-10-CM | POA: Insufficient documentation

## 2015-11-25 DIAGNOSIS — I480 Paroxysmal atrial fibrillation: Secondary | ICD-10-CM | POA: Diagnosis not present

## 2015-11-25 DIAGNOSIS — Z951 Presence of aortocoronary bypass graft: Secondary | ICD-10-CM | POA: Insufficient documentation

## 2015-11-25 DIAGNOSIS — I5031 Acute diastolic (congestive) heart failure: Secondary | ICD-10-CM | POA: Insufficient documentation

## 2015-11-25 DIAGNOSIS — I2510 Atherosclerotic heart disease of native coronary artery without angina pectoris: Secondary | ICD-10-CM | POA: Diagnosis not present

## 2015-11-25 DIAGNOSIS — E785 Hyperlipidemia, unspecified: Secondary | ICD-10-CM | POA: Insufficient documentation

## 2015-11-25 DIAGNOSIS — I4891 Unspecified atrial fibrillation: Secondary | ICD-10-CM | POA: Diagnosis present

## 2015-11-25 NOTE — Progress Notes (Signed)
Patient ID: Lindsay Nguyen, female   DOB: 12/22/1935, 80 y.o.   MRN: [id number]        Patient Name: Lindsay Nguyen Study Date: 11/12/2015 Gender: Female D.O.B: 1935-09-19 Age (years): 79 Referring Provider: [name] Height (inches): 67 Interpreting Physician: Shelva Majestic MD, ABSM Weight (lbs): 203 RPSGT: Joni Reining BMI: 31 MRN: [id number] Neck Size: 14.00  CLINICAL INFORMATION Sleep Study Type: NPSG Indication for sleep study: OSA Epworth Sleepiness Score: 6  SLEEP STUDY TECHNIQUE As per the AASM Manual for the Scoring of Sleep and Associated Events v2.3 (April 2016) with a hypopnea requiring 4% desaturations. The channels recorded and monitored were frontal, central and occipital EEG, electrooculogram (EOG), submentalis EMG (chin), nasal and oral airflow, thoracic and abdominal wall motion, anterior tibialis EMG, snore microphone, electrocardiogram, and pulse oximetry.  MEDICATIONS  alendronate (FOSAMAX) 70 MG tablet 70 mg, Every Sun     amLODipine (NORVASC) 2.5 MG tablet 2.5 mg, Daily     apixaban (ELIQUIS) 5 MG TABS tablet 5 mg, 2 times daily     aspirin EC 81 MG tablet 81 mg, Daily     atorvastatin (LIPITOR) 10 MG tablet 10 mg, Daily-1800     budesonide (PULMICORT) 180 MCG/ACT inhaler 2 puff, Daily     carvedilol (COREG) 12.5 MG tablet      Patient taking differently: Take 18.75 mg by mouth daily in the morning and Take 12.5 mg by mouth in the evening, Reason: Added Indication / FREQ , Informant: Self, Reported on 09/27/2015   escitalopram (LEXAPRO) 10 MG tablet 10 mg, Daily     furosemide (LASIX) 40 MG tablet      Patient taking differently: TAKE 1 TABLET DAILY AS NEEDED FOR FLUID, Reason: Patient Preference, Reported on 09/27/2015   KOMBIGLYZE XR 12-998 MG TB24 1 tablet, Daily at bedtime     levalbuterol (XOPENEX HFA) 45 MCG/ACT inhaler 1-2 puff, Every 4 hours PRN     levothyroxine (SYNTHROID, LEVOTHROID) 112 MCG tablet 56 mcg, Daily before breakfast     montelukast (SINGULAIR) 10 MG tablet 10 mg, Daily at bedtime     naproxen sodium (ANAPROX) 220 MG tablet 220 mg, As needed     nitroGLYCERIN (NITROSTAT) 0.4 MG SL tablet 0.4 mg, Every 5 min PRN     traZODone (DESYREL) 50 MG tablet 50 mg, At bedtime PRN     valsartan (DIOVAN) 160 MG tablet 160 mg, Daily     vitamin E 100 UNIT capsule   Medications self-administered by patient during sleep study : No sleep medicine administered.  SLEEP ARCHITECTURE The study was initiated at 10:43:00 PM and ended at 4:56:32 AM. Sleep onset time was 16.2 minutes and the sleep efficiency was 51.7%. The total sleep time was 193.0 minutes. Wake afteer sleep onset was 164.4 minutes.  Stage REM latency was N/A minutes. The patient spent 7.51% of the night in stage N1 sleep, 69.43% in stage N2 sleep, 23.06% in stage N3 and 0.00% in REM. Alpha intrusion was absent. Supine sleep was 1.81%.  RESPIRATORY PARAMETERS The overall apnea/hypopnea index (AHI) was 2.2 per hour. There were 1 total apneas, including 1 obstructive, 0 central and 0 mixed apneas. There were 6 hypopneas and 1 RERAs. The AHI during Stage REM sleep was N/A per hour. AHI while supine was 51.4 per hour. The mean oxygen saturation was 89.73%. The minimum SpO2 during sleep was 86.00%. Soft snoring was noted during this study.  CARDIAC DATA The 2 lead EKG demonstrated sinus rhythm. The mean  heart rate was 53.16 beats per minute. Other EKG findings include: None.  LEG MOVEMENT DATA The total PLMS were 114 with a resulting PLMS index of 35.44. Associated arousal with leg movement index was 5.3 .  IMPRESSIONS - No significant obstructive sleep apnea occurred during this study (AHI = 2.2/h); however, the severity of sleep disordered breathing may be underestimated since events were severe during the minimal supine sleep achieved (1.8% of night with AHI 51.4/h) and absence of REM sleep. - No significant central sleep apnea occurred during this study  (CAI = 0.0/h). - Mild oxygen desaturation was noted during this study (Min O2 = 86.00%); however, mean SpO2 was 89.7% with 79.8% of night <90%. - Reduced sleep efficiency at only 51.7% - Soft snoring volume. - No cardiac abnormalities were noted during this study. - Moderate periodic limb movements of sleep occurred during the study with a PLMS index of 35.44. Associated arousals were significant.  DIAGNOSIS - Nocturnal Hypoxemia (327.26 [G47.36 ICD-10]) - Increased Upper Airway resistance syndrome  RECOMMENDATIONS - At present patient does not meet criteria for CPAP therapy. - Positional therapy avoiding supine position during sleep. - Avoid alcohol, sedatives and other CNS depressants that may worsen sleep apnea and disrupt normal sleep architecture. - Sleep hygiene should be reviewed to assess factors that may improve sleep quality. - Weight management and regular exercise should be initiated or continued if appropriate. - If patient is symptomatic with restless legs, consider a trial of medical therapy. - In this patient with cardiovascular comorbidities, if patient's symptoms progress consider a follow-up study for further evaluation since REM sleep was absent on the present study.   Troy Sine, MD, [place], American Board of Sleep Medicine  ELECTRONICALLY SIGNED ON:  11/25/2015, 6:37 AM [place] PH: [phone]   FX: [phone] [place]

## 2015-12-03 ENCOUNTER — Telehealth: Payer: Self-pay | Admitting: *Deleted

## 2015-12-03 NOTE — Telephone Encounter (Signed)
Called and notified patient of sleep study results. 

## 2015-12-11 ENCOUNTER — Other Ambulatory Visit: Payer: Self-pay | Admitting: *Deleted

## 2015-12-11 MED ORDER — CARVEDILOL 12.5 MG PO TABS: ORAL_TABLET | ORAL | Status: DC

## 2015-12-11 NOTE — Telephone Encounter (Signed)
Rx has been sent to the pharmacy electronically. ° °

## 2015-12-12 DIAGNOSIS — Z6831 Body mass index (BMI) 31.0-31.9, adult: Secondary | ICD-10-CM | POA: Diagnosis not present

## 2015-12-12 DIAGNOSIS — J45909 Unspecified asthma, uncomplicated: Secondary | ICD-10-CM | POA: Diagnosis not present

## 2015-12-12 DIAGNOSIS — E119 Type 2 diabetes mellitus without complications: Secondary | ICD-10-CM | POA: Diagnosis not present

## 2015-12-12 DIAGNOSIS — F419 Anxiety disorder, unspecified: Secondary | ICD-10-CM | POA: Diagnosis not present

## 2015-12-12 DIAGNOSIS — I509 Heart failure, unspecified: Secondary | ICD-10-CM | POA: Diagnosis not present

## 2015-12-20 ENCOUNTER — Ambulatory Visit: Payer: Medicare Other | Admitting: Cardiovascular Disease

## 2016-04-02 ENCOUNTER — Ambulatory Visit (INDEPENDENT_AMBULATORY_CARE_PROVIDER_SITE_OTHER): Payer: Medicare Other | Admitting: Family Medicine

## 2016-04-02 ENCOUNTER — Encounter: Payer: Self-pay | Admitting: Family Medicine

## 2016-04-02 VITALS — BP 128/70 | HR 80 | Temp 98.1°F | Resp 14 | Ht 68.11 in | Wt 212.0 lb

## 2016-04-02 DIAGNOSIS — Z23 Encounter for immunization: Secondary | ICD-10-CM | POA: Diagnosis not present

## 2016-04-02 DIAGNOSIS — E039 Hypothyroidism, unspecified: Secondary | ICD-10-CM | POA: Diagnosis not present

## 2016-04-02 DIAGNOSIS — E785 Hyperlipidemia, unspecified: Secondary | ICD-10-CM

## 2016-04-02 DIAGNOSIS — E119 Type 2 diabetes mellitus without complications: Secondary | ICD-10-CM | POA: Diagnosis not present

## 2016-04-02 DIAGNOSIS — I480 Paroxysmal atrial fibrillation: Secondary | ICD-10-CM

## 2016-04-02 DIAGNOSIS — I10 Essential (primary) hypertension: Secondary | ICD-10-CM

## 2016-04-02 DIAGNOSIS — H6121 Impacted cerumen, right ear: Secondary | ICD-10-CM | POA: Diagnosis not present

## 2016-04-02 DIAGNOSIS — Z951 Presence of aortocoronary bypass graft: Secondary | ICD-10-CM

## 2016-04-02 NOTE — Progress Notes (Signed)
Subjective:    Patient ID: Lindsay Nguyen, female    DOB: 1936/04/17, 80 y.o.   MRN: [id number]  HPI  Patient is here today to establish care. Past medical history is significant for coronary artery disease. Patient has a history of PTCA in 2011 and ultimately underwent a three-vessel CABG. She also has a history of diastolic congestive heart failure and was admitted earlier this year with a BNP greater than 2000 and shortness of breath. She also has a history of atrial fibrillation for which she is currently on Eliquis. Today on her exam she is in atrial fibrillation although she is borderline bradycardic with a heart rate of 62 bpm. She is also taking Naprosyn on a daily basis for generalized aches and pains. I spent 10 minutes discussing the risk of taking anti-inflammatory medication with anticoagulants. I recommended switching to Tylenol.  Other history is also significant for chronic bronchitis/COPD for which she takes Pulmicort. She denies any wheezing at present. She also has a history of multinodular goiter. She is currently on levothyroxine. She sees an endocrinologist once year to check an ultrasound of her goiter to monitor the size of her nodules. She also has type 2 diabetes mellitus. She is currently on onglyza 2.5 mg a day and amaryl 2 mg pobid.  However she is not checking her blood sugars. She denies any hypoglycemic episodes. The last sugar I see was greater than 300 in March at the hospital. She denies any polyuria polydipsia or blurred vision. Past surgical history is significant for a thoracic ascending aortic aneurysm that was repaired at the same time as her CABG. She also complains of hearing loss in her right ear. She has a cerumen impaction in the right ear.  Patient has had Pneumovax 23 in the past. She believes that she's had Prevnar 13. She is due for a flu shot. She schedules her mammogram every year. Her last colonoscopy was approximately 5 years ago and was normal [place] at  her gastroenterologist told her that she did not need to have another. Due to her age she is not required to have it Pap smear. Past Medical History:  Diagnosis Date  . Anemia   . Atrial fibrillation ([place])    persistent  . CAD (coronary artery disease)    2D ECHO, 11/04/2010 - EF >55%, mild-moderate mitral regurgitation, moderate tricuspid regurgitation  . Diabetes mellitus   . GERD (gastroesophageal reflux disease)   . Hyperlipidemia   . Hypertension   . Non-STEMI (non-ST elevated myocardial infarction) (HCC)    NUCLEAR STRESS TEST, 11/04/2010 - normal, no ischemia  . Osteopenia   . Pain in limb    LEA VENOUS DUPLEX, 06/27/2010 - no evidence of DVT  . Thoracic aortic aneurysm (HCC)    status post resection and grafting  . Thyroid disease    hypothyroid   Past Surgical History:  Procedure Laterality Date  . CARDIAC CATHETERIZATION  06/18/2010   Recommended CABG  . CARDIOVERSION N/A 09/30/2015   Procedure: CARDIOVERSION;  Surgeon: [name], MD;  Location: [facility];  Service: Cardiovascular;  Laterality: N/A;  . CHOLECYSTECTOMY    . CORONARY ARTERY BYPASS GRAFT  06/18/2010   LIMA-LAD, vein-diagonal branch, vein-obtuse marginal branch, and resectioning and grafting of throacic aortic aneurysm  . ELBOW SURGERY  1979   fracture left  . TONSILLECTOMY  1945  . TUBAL LIGATION  1971   Current Outpatient Prescriptions on File Prior to Visit  Medication Sig Dispense Refill  .  amLODipine (NORVASC) 2.5 MG tablet Take 2.5 mg by mouth daily.    Marland Kitchen apixaban (ELIQUIS) 5 MG TABS tablet Take 1 tablet (5 mg total) by mouth 2 (two) times daily. 180 tablet 2  . aspirin EC 81 MG tablet Take 1 tablet (81 mg total) by mouth daily. 90 tablet 3  . atorvastatin (LIPITOR) 10 MG tablet Take 10 mg by mouth daily at 6 PM.    . budesonide (PULMICORT) 180 MCG/ACT inhaler Inhale 2 puffs into the lungs daily at 12 noon.     . carvedilol (COREG) 12.5 MG tablet TAKE 1&1/2 TABLETS BY MOUTH EVERY MORNING AND 1  TABLET EVERY EVENING 225 tablet 2  . escitalopram (LEXAPRO) 10 MG tablet Take 10 mg by mouth daily.    . furosemide (LASIX) 40 MG tablet TAKE 1 TABLET DAILY (Patient taking differently: TAKE 1 TABLET DAILY AS NEEDED FOR FLUID) 90 tablet 2  . levothyroxine (SYNTHROID, LEVOTHROID) 112 MCG tablet Take 56 mcg by mouth daily before breakfast. 1/2 tab daily, BRAND only per Dr Rogelia Mire    . montelukast (SINGULAIR) 10 MG tablet Take 10 mg by mouth at bedtime.    . naproxen sodium (ANAPROX) 220 MG tablet Take 220 mg by mouth as needed (PAIN, HEADACHE).     . nitroGLYCERIN (NITROSTAT) 0.4 MG SL tablet Place 0.4 mg under the tongue every 5 (five) minutes as needed for chest pain.     . traZODone (DESYREL) 50 MG tablet Take 50 mg by mouth at bedtime as needed.  0  . valsartan (DIOVAN) 160 MG tablet Take 1 tablet (160 mg total) by mouth daily. 90 tablet 3  . KOMBIGLYZE XR 12-998 MG TB24 Take 1 tablet by mouth at bedtime.  0   No current facility-administered medications on file prior to visit.    Allergies  Allergen Reactions  . Iodine Anaphylaxis and Swelling  . Contrast Media [Iodinated Diagnostic Agents]   . Gabapentin Hives  . Shellfish Allergy    Social History   Social History  . Marital status: Married    Spouse name: N/A  . Number of children: N/A  . Years of education: N/A   Occupational History  . Not on file.   Social History Main Topics  . Smoking status: Never Smoker  . Smokeless tobacco: Never Used  . Alcohol use No  . Drug use: No  . Sexual activity: Not on file   Other Topics Concern  . Not on file   Social History Narrative  . No narrative on file   Family History  Problem Relation Age of Onset  . Hypertension Mother   . Osteoporosis Mother   . Heart disease Father   . Stroke Father   . Alzheimer's disease Father   . Heart attack Father      Review of Systems  All other systems reviewed and are negative.      Objective:   Physical Exam  Constitutional:  She is oriented to person, place, and time. She appears well-developed and well-nourished. No distress.  HENT:  Head: Normocephalic and atraumatic.  Right Ear: External ear normal.  Left Ear: External ear normal.  Nose: Nose normal.  Mouth/Throat: Oropharynx is clear and moist. No oropharyngeal exudate.  Eyes: Conjunctivae and EOM are normal. Pupils are equal, round, and reactive to light. Right eye exhibits no discharge. Left eye exhibits no discharge. No scleral icterus.  Neck: Normal range of motion. Neck supple. No JVD present. No tracheal deviation present. No thyromegaly present.  Cardiovascular: Normal rate.  An irregularly irregular rhythm present.  Murmur heard. Pulmonary/Chest: Effort normal and breath sounds normal. No stridor. No respiratory distress. She has no wheezes. She has no rales. She exhibits no tenderness.  Abdominal: Soft. Bowel sounds are normal. She exhibits no distension and no mass. There is no tenderness. There is no rebound and no guarding.  Musculoskeletal: She exhibits no edema, tenderness or deformity.  Lymphadenopathy:    She has no cervical adenopathy.  Neurological: She is alert and oriented to person, place, and time. She has normal reflexes. She displays normal reflexes. No cranial nerve deficit. She exhibits normal muscle tone. Coordination normal.  Skin: Skin is warm. No rash noted. She is not diaphoretic. No erythema. No pallor.  Psychiatric: She has a normal mood and affect. Her behavior is normal. Judgment and thought content normal.  Vitals reviewed.         Assessment & Plan:  Essential hypertension  PAF (paroxysmal atrial fibrillation) (HCC)  Controlled type 2 diabetes mellitus without complication, without long-term current use of insulin ([place]) - Plan: CBC with Differential/Platelet, COMPLETE METABOLIC PANEL WITH GFR, Lipid panel, Hemoglobin A1c, Microalbumin, urine  Hx of CABG  HLD (hyperlipidemia)  Hypothyroidism, unspecified  hypothyroidism type - Plan: TSH  Cerumen impaction, right  Her blood pressure today is well controlled. I'll make no changes in her blood pressure medication. I would like to check a fasting lipid panel. Her goal LDL cholesterol be less than 70 given her history of coronary artery disease. I would like to check hemoglobin A1c. Given her age, her ideal hemoglobin A1c would be less than 7. Given her history of coronary artery disease, I would consider switching the patient from onglyza to jardiance due to the reduction in [place] demonstrated in recent studies. I would also like to check a TSH to monitor the treatment of her hypothyroidism. Cerumen impaction was removed from her right ear with irrigation and lavage. Patient received her flu shot today.

## 2016-04-02 NOTE — Addendum Note (Signed)
Addended by: Sheral Flow on: 04/02/2016 11:24 AM   Modules accepted: Orders

## 2016-04-03 ENCOUNTER — Other Ambulatory Visit: Payer: Medicare Other

## 2016-04-03 DIAGNOSIS — E039 Hypothyroidism, unspecified: Secondary | ICD-10-CM | POA: Diagnosis not present

## 2016-04-03 DIAGNOSIS — E119 Type 2 diabetes mellitus without complications: Secondary | ICD-10-CM | POA: Diagnosis not present

## 2016-04-03 LAB — COMPLETE METABOLIC PANEL WITH GFR
ALT: 13 U/L (ref 6–29)
AST: 19 U/L (ref 10–35)
Albumin: 4 g/dL (ref 3.6–5.1)
Alkaline Phosphatase: 49 U/L (ref 33–130)
BILIRUBIN TOTAL: 0.7 mg/dL (ref 0.2–1.2)
BUN: 23 mg/dL (ref 7–25)
CALCIUM: 9.1 mg/dL (ref 8.6–10.4)
CO2: 28 mmol/L (ref 20–31)
CREATININE: 1.12 mg/dL — AB (ref 0.60–0.93)
Chloride: 103 mmol/L (ref 98–110)
GFR, EST AFRICAN AMERICAN: 54 mL/min — AB (ref >=60)
GFR, EST NON AFRICAN AMERICAN: 47 mL/min — AB (ref >=60)
Glucose, Bld: 142 mg/dL — ABNORMAL HIGH (ref 70–99)
Potassium: 4.6 mmol/L (ref 3.5–5.3)
Sodium: 143 mmol/L (ref 135–146)
TOTAL PROTEIN: 7.4 g/dL (ref 6.1–8.1)

## 2016-04-03 LAB — LIPID PANEL
CHOLESTEROL: 128 mg/dL (ref 125–200)
HDL: 32 mg/dL — AB (ref >=46)
LDL Cholesterol: 58 mg/dL (ref <130)
TRIGLYCERIDES: 190 mg/dL — AB (ref <150)
Total CHOL/HDL Ratio: 4 Ratio (ref <=5.0)
VLDL: 38 mg/dL — ABNORMAL HIGH (ref <30)

## 2016-04-03 LAB — CBC WITH DIFFERENTIAL/PLATELET
BASOS ABS: 0 {cells}/uL (ref 0–200)
BASOS PCT: 0 %
EOS ABS: 292 {cells}/uL (ref 15–500)
Eosinophils Relative: 4 %
HEMATOCRIT: 40.8 % (ref 35.0–45.0)
Hemoglobin: 13.8 g/dL (ref 12.0–15.0)
LYMPHS PCT: 26 %
Lymphs Abs: 1898 cells/uL (ref 850–3900)
MCH: 32 pg (ref 27.0–33.0)
MCHC: 33.8 g/dL (ref 32.0–36.0)
MCV: 94.7 fL (ref 80.0–100.0)
MONO ABS: 949 {cells}/uL (ref 200–950)
MONOS PCT: 13 %
MPV: 11.3 fL (ref 7.5–12.5)
Neutro Abs: 4161 cells/uL (ref 1500–7800)
Neutrophils Relative %: 57 %
PLATELETS: 235 10*3/uL (ref 140–400)
RBC: 4.31 MIL/uL (ref 3.80–5.10)
RDW: 13.1 % (ref 11.0–15.0)
WBC: 7.3 10*3/uL (ref 3.8–10.8)

## 2016-04-04 LAB — HEMOGLOBIN A1C
Hgb A1c MFr Bld: 7.3 % — ABNORMAL HIGH (ref <5.7)
Mean Plasma Glucose: 163 mg/dL

## 2016-04-04 LAB — MICROALBUMIN, URINE: Microalb, Ur: 169.5 mg/dL

## 2016-04-04 LAB — TSH: TSH: 1.69 m[IU]/L

## 2016-04-09 ENCOUNTER — Other Ambulatory Visit: Payer: Self-pay | Admitting: Family Medicine

## 2016-04-09 MED ORDER — LEVOTHYROXINE SODIUM 112 MCG PO TABS: 56.0000 ug | ORAL_TABLET | Freq: Every day | ORAL | 0 refills | Status: DC

## 2016-04-09 MED ORDER — AMLODIPINE BESYLATE 2.5 MG PO TABS: 2.5000 mg | ORAL_TABLET | Freq: Every day | ORAL | 1 refills | Status: DC

## 2016-04-09 MED ORDER — MONTELUKAST SODIUM 10 MG PO TABS: 10.0000 mg | ORAL_TABLET | Freq: Every day | ORAL | 1 refills | Status: DC

## 2016-04-09 MED ORDER — FUROSEMIDE 40 MG PO TABS: ORAL_TABLET | ORAL | 1 refills | Status: DC

## 2016-04-09 MED ORDER — CARVEDILOL 12.5 MG PO TABS: ORAL_TABLET | ORAL | 2 refills | Status: DC

## 2016-04-09 MED ORDER — BUDESONIDE 180 MCG/ACT IN AEPB: 2.0000 | INHALATION_SPRAY | Freq: Every day | RESPIRATORY_TRACT | 1 refills | Status: DC

## 2016-04-09 MED ORDER — SAXAGLIPTIN HCL 2.5 MG PO TABS: 2.5000 mg | ORAL_TABLET | Freq: Every day | ORAL | 1 refills | Status: DC

## 2016-04-09 MED ORDER — GLIMEPIRIDE 2 MG PO TABS: 2.0000 mg | ORAL_TABLET | Freq: Two times a day (BID) | ORAL | 1 refills | Status: DC

## 2016-04-09 MED ORDER — ESCITALOPRAM OXALATE 10 MG PO TABS: 10.0000 mg | ORAL_TABLET | Freq: Every day | ORAL | 1 refills | Status: DC

## 2016-04-09 MED ORDER — VALSARTAN 160 MG PO TABS: 160.0000 mg | ORAL_TABLET | Freq: Every day | ORAL | 1 refills | Status: DC

## 2016-04-09 MED ORDER — ATORVASTATIN CALCIUM 10 MG PO TABS: 10.0000 mg | ORAL_TABLET | Freq: Every day | ORAL | 1 refills | Status: DC

## 2016-04-09 MED ORDER — TRAZODONE HCL 50 MG PO TABS: 50.0000 mg | ORAL_TABLET | Freq: Every evening | ORAL | 1 refills | Status: DC | PRN

## 2016-04-09 NOTE — Telephone Encounter (Signed)
Medication refilled per protocol. 

## 2016-04-24 ENCOUNTER — Other Ambulatory Visit: Payer: Self-pay | Admitting: Family Medicine

## 2016-04-24 MED ORDER — LEVOTHYROXINE SODIUM 112 MCG PO TABS: 56.0000 ug | ORAL_TABLET | Freq: Every day | ORAL | 3 refills | Status: DC

## 2016-05-15 ENCOUNTER — Telehealth: Payer: Self-pay | Admitting: Family Medicine

## 2016-05-15 ENCOUNTER — Other Ambulatory Visit: Payer: Self-pay | Admitting: Family Medicine

## 2016-05-15 NOTE — Telephone Encounter (Signed)
Patient needs refill on Diovan sent to her pharmacy  [facility] [place]  [phone]

## 2016-05-18 MED ORDER — VALSARTAN 160 MG PO TABS
160.0000 mg | ORAL_TABLET | Freq: Every day | ORAL | 0 refills | Status: DC
Start: 2016-05-18 — End: 2016-11-09

## 2016-05-18 NOTE — Telephone Encounter (Signed)
Medication called/sent to requested pharmacy  

## 2016-05-21 ENCOUNTER — Other Ambulatory Visit: Payer: Self-pay | Admitting: Cardiovascular Disease

## 2016-05-21 NOTE — Telephone Encounter (Signed)
Returned call to patient's daughter Santiago Glad.She stated her mother is taking Eliquis 5 mg twice a day.Stated for the past 1 week she has had a nosebleed daily.Stated she decreased Eliquis to 2.5 mg twice a day yesterday.She has appointment with Dr.Berry 05/26/16 she wanted to make sure ok to continue with 2.5 mg twice a day until her appointment.Advised I will ask our pharmacist.Message sent to [facility].

## 2016-05-21 NOTE — Telephone Encounter (Signed)
Spoke with daughter Santiago Glad.  Advised they get nasal saline and use frequently to avoid dry sinuses for a few days.  Also can use Afrin nasal spray twice daily for 1-2 days until bleeding stops.  Should get back on to 5 mg Eliquis as soon as possible, as she does not fit the criteria for lower dose.   Daughter voiced understanding.

## 2016-05-21 NOTE — Telephone Encounter (Signed)
Pt's dtr calling-pt has had a nosebleed every day for the last few days, appt not until 05-26-16, wants to know what to do-pt on blood thinner [phone] dtr Damaris Hippo

## 2016-05-26 ENCOUNTER — Ambulatory Visit
Admission: RE | Admit: 2016-05-26 | Discharge: 2016-05-26 | Disposition: A | Discharge status: Home / Self Care | Payer: Medicare Other | Source: Ambulatory Visit | Attending: Cardiovascular Disease | Admitting: Cardiovascular Disease

## 2016-05-26 ENCOUNTER — Encounter: Payer: Self-pay | Admitting: Cardiovascular Disease

## 2016-05-26 ENCOUNTER — Other Ambulatory Visit: Payer: Self-pay | Admitting: *Deleted

## 2016-05-26 ENCOUNTER — Ambulatory Visit (INDEPENDENT_AMBULATORY_CARE_PROVIDER_SITE_OTHER): Payer: Medicare Other | Admitting: Cardiovascular Disease

## 2016-05-26 VITALS — BP 130/74 | HR 85 | Ht 69.0 in | Wt 211.0 lb

## 2016-05-26 DIAGNOSIS — E7800 Pure hypercholesterolemia, unspecified: Secondary | ICD-10-CM

## 2016-05-26 DIAGNOSIS — I480 Paroxysmal atrial fibrillation: Secondary | ICD-10-CM

## 2016-05-26 DIAGNOSIS — Z7901 Long term (current) use of anticoagulants: Secondary | ICD-10-CM

## 2016-05-26 DIAGNOSIS — Z01818 Encounter for other preprocedural examination: Secondary | ICD-10-CM

## 2016-05-26 DIAGNOSIS — I10 Essential (primary) hypertension: Secondary | ICD-10-CM

## 2016-05-26 DIAGNOSIS — R0602 Shortness of breath: Secondary | ICD-10-CM | POA: Diagnosis not present

## 2016-05-26 LAB — CBC WITH DIFFERENTIAL/PLATELET
BASOS PCT: 0 %
Basophils Absolute: 0 cells/uL (ref 0–200)
EOS ABS: 219 {cells}/uL (ref 15–500)
Eosinophils Relative: 3 %
HEMATOCRIT: 41 % (ref 35.0–45.0)
Hemoglobin: 14 g/dL (ref 11.7–15.5)
LYMPHS ABS: 1825 {cells}/uL (ref 850–3900)
Lymphocytes Relative: 25 %
MCH: 32.6 pg (ref 27.0–33.0)
MCHC: 34.1 g/dL (ref 32.0–36.0)
MCV: 95.3 fL (ref 80.0–100.0)
MONO ABS: 730 {cells}/uL (ref 200–950)
MPV: 11.5 fL (ref 7.5–12.5)
Monocytes Relative: 10 %
NEUTROS ABS: 4526 {cells}/uL (ref 1500–7800)
Neutrophils Relative %: 62 %
PLATELETS: 216 10*3/uL (ref 140–400)
RBC: 4.3 MIL/uL (ref 3.80–5.10)
RDW: 13.4 % (ref 11.0–15.0)
WBC: 7.3 10*3/uL (ref 3.8–10.8)

## 2016-05-26 NOTE — Progress Notes (Signed)
05/26/2016 [place]   12-25-1935  FP:[id number]  Primary Physician Odette Fraction, MD Primary Cardiologist: [name] MD [name]  HPI:  The patient returns today for  Followup. I recently saw her in the office 11/06/15. She is a pleasant, 80 year old, mildly overweight, married Caucasian female, mother of 2, grandmother to 2 grandchildren accompanied by her son and daughter.. Her risk factors include family history, hypertension, hyperlipidemia and noninsulin-requiring diabetes. She had a non-ST-segment-elevation myocardial infarction March 21, 2010, with peak troponin of 4. I brought her to the cath lab the following day and stented her proximal LAD with a bare-metal stent. She did have nonobstructive disease beyond the stent as well as a diagonal branch, AV groove circumflex with an EF of 45% and anteroapical akinesia. Because of a generous thoracic aorta on cath, I performed a CT angiogram on her revealing her thoracic aorta which measured 4.9 cm. She was seen by Dr. Gilford Raid for evaluation of this at my request. Myoview performed June 11, 2010, showed apical and inferior ischemia as well as anterior ischemia. I noted her June 17, 2010, with rapid onset dyspnea relieved with sublingual nitroglycerin. The patient ruled out for myocardial infarction and was heparinized. I ultimately cath'd her November 16 revealing aggressive "in-stent restenosis" within the proximal LAD stent, as well as in her diagonal branch and AV groove circumflex.   She ultimately underwent coronary artery bypass grafting by Dr. Gilford Raid with LIMA to her LAD, a vein to a diagonal branch, as well as obtuse marginal branch with resection and grafting of her thoracic aortic aneurysm using a 20-mm supracoronary Hemashield 2 graft utilizing deep hypothermic circulatory arrest with resuspension of her native aortic valve and reimplantation of her native coronary arteries. Her postop  course was complicated and prolonged, lasting 4 weeks. She did have atrial fibrillation, pneumonia, cholecystitis status post cholecystectomy, as well as pleural effusions. She had a percutaneous drain of her gallbladder and ultimately cholecystectomy by Dr. Excell Seltzer September 18, 2010. She recuperated nicely. She had a Myoview performed November 04, 2010, which was nonischemic and a 2D echo that showed normal LV systolic function with a normal functioning aortic valve.  When I saw her last she was in atrial fibrillation which was confirmed on 30 day event monitoring with tachybradycardia syndrome. She was placed on oral anticoagulation and ultimately underwent successful outpatient cardioversion by Dr. Dorris Carnes 09/30/15. She remains in sinus rhythm/sinus bradycardia. She was hospitalized in [place] 10/15/15 for 2 days with upper respiratory tract infection. She did have diastolic heart failure that time with a BNP of greater than 2000 and was diuresed. A 2-D echo did show moderate to severe pulmonary hypertension without PA systolic pressure of 0000000 mmHg and septal flattening. Apparently a pulmonary embolus was ruled out at that time. A subsequent 2-D echo performed 11/25/15 showed normal pulmonary artery pressures and normal LV function. She did have an outpatient sleep study that did not show sleep apnea. She's had increasing fatigue and dyspnea over the last several weeks.   Current Outpatient Prescriptions  Medication Sig Dispense Refill  . amLODipine (NORVASC) 2.5 MG tablet Take 1 tablet (2.5 mg total) by mouth daily. 90 tablet 1  . aspirin EC 81 MG tablet Take 1 tablet (81 mg total) by mouth daily. 90 tablet 3  . atorvastatin (LIPITOR) 10 MG tablet Take 1 tablet (10 mg total) by mouth daily at 6 PM. 90 tablet 1  . budesonide (PULMICORT)  180 MCG/ACT inhaler Inhale 2 puffs into the lungs daily at 12 noon. 3 each 1  . carvedilol (COREG) 12.5 MG tablet TAKE 1&1/2 TABLETS BY MOUTH EVERY  MORNING AND 1 TABLET EVERY EVENING 225 tablet 2  . ELIQUIS 5 MG TABS tablet TAKE 1 TABLET(5 MG) BY MOUTH TWICE DAILY 180 tablet 0  . escitalopram (LEXAPRO) 10 MG tablet Take 1 tablet (10 mg total) by mouth daily. 90 tablet 1  . furosemide (LASIX) 40 MG tablet TAKE 1 TABLET DAILY AS NEEDED FOR FLUID 90 tablet 1  . glimepiride (AMARYL) 2 MG tablet Take 1 tablet (2 mg total) by mouth 2 (two) times daily. 180 tablet 1  . KOMBIGLYZE XR 12-998 MG TB24 Take 1 tablet by mouth at bedtime.  0  . levothyroxine (SYNTHROID, LEVOTHROID) 112 MCG tablet Take 0.5 tablets (56 mcg total) by mouth daily before breakfast. 1/2 tab daily, BRAND only per Dr palel 90 tablet 3  . montelukast (SINGULAIR) 10 MG tablet Take 1 tablet (10 mg total) by mouth at bedtime. 90 tablet 1  . naproxen sodium (ANAPROX) 220 MG tablet Take 220 mg by mouth as needed (PAIN, HEADACHE).     . nitroGLYCERIN (NITROSTAT) 0.4 MG SL tablet Place 0.4 mg under the tongue every 5 (five) minutes as needed for chest pain.     . saxagliptin HCl (ONGLYZA) 2.5 MG TABS tablet Take 1 tablet (2.5 mg total) by mouth daily. 90 tablet 1  . traZODone (DESYREL) 50 MG tablet Take 1 tablet (50 mg total) by mouth at bedtime as needed. 90 tablet 1  . valsartan (DIOVAN) 160 MG tablet Take 1 tablet (160 mg total) by mouth daily. 90 tablet 0   No current facility-administered medications for this visit.     Allergies  Allergen Reactions  . Iodine Anaphylaxis and Swelling  . Contrast Media [Iodinated Diagnostic Agents]   . Gabapentin Hives  . Shellfish Allergy     Social History   Social History  . Marital status: Married    Spouse name: N/A  . Number of children: N/A  . Years of education: N/A   Occupational History  . Not on file.   Social History Main Topics  . Smoking status: Never Smoker  . Smokeless tobacco: Never Used  . Alcohol use No  . Drug use: No  . Sexual activity: Not on file   Other Topics Concern  . Not on file   Social History  Narrative  . No narrative on file     Review of Systems: General: negative for chills, fever, night sweats or weight changes.  Cardiovascular: negative for chest pain, dyspnea on exertion, edema, orthopnea, palpitations, paroxysmal nocturnal dyspnea or shortness of breath Dermatological: negative for rash Respiratory: negative for cough or wheezing Urologic: negative for hematuria Abdominal: negative for nausea, vomiting, diarrhea, bright red blood per rectum, melena, or hematemesis Neurologic: negative for visual changes, syncope, or dizziness All other systems reviewed and are otherwise negative except as noted above.    Blood pressure 130/74, pulse 85, height 5\' 9"  (1.753 m), weight 211 lb (95.7 kg).  General appearance: alert and no distress Neck: no adenopathy, no carotid bruit, no JVD, supple, symmetrical, trachea midline and thyroid not enlarged, symmetric, no tenderness/mass/nodules Lungs: clear to auscultation bilaterally Heart: irregularly irregular rhythm Extremities: extremities normal, atraumatic, no cyanosis or edema  EKG atrial fibrillation with a ventricular response of 85 and left bundle branch block. I personally reviewed this EKG  ASSESSMENT AND PLAN:   HLD (  hyperlipidemia) History of hyperlipidemia on statin therapy with recent lipid profile performed 04/03/16 revealed a total cholesterol 120, LDL of 58 and HDL 32  Essential hypertension History of hypertension blood pressure is 130/74. She is on amlodipine, carvedilol and valsartan. Continue current meds at current dosing  Hx of CABG History of CAD status post non-ST segment elevation myocardial infarction 03/21/2010 treated with stenting of her proximal LAD. Her EF at that time was 45%. She had a large thoracic aortic aneurysm. She also underwent coronary artery bypass grafting and thoracic aortic aneurysm repair by Dr. Arvid Right  06/18/2010 millimeter LAD, vein to diagonal branch, obtuse marginal branch. She  had resection of her thoracic aortic aneurysm utilizing a Hemashield graft under deep hypothermic circulatory arrest with spinning herniated aortic valve and reimplantation of her normal coronary arteries.  PAF (paroxysmal atrial fibrillation) (HCC) History of paroxysmal atrial fibrillation status post outpatient DC cardioversion by Dr. Dorris Carnes 09/20/15 successfully to sinus rhythm. She has been on Eliquis oral anticoagulation. She's noticed increasing fatigue and dyspnea over last several weeks and is in [name] today with a controlled ventricular response. I'm going to arrange for her to undergo outpatient DC cardioversion followed by appointment in the atrial fibrillation clinic for consideration of antiarrhythmic medication.      [name] MD FACP,FACC,FAHA, [facility] 05/26/2016 2:33 PM

## 2016-05-26 NOTE — Assessment & Plan Note (Signed)
History of CAD status post non-ST segment elevation myocardial infarction 03/21/2010 treated with stenting of her proximal LAD. Her EF at that time was 45%. She had a large thoracic aortic aneurysm. She also underwent coronary artery bypass grafting and thoracic aortic aneurysm repair by Dr. Arvid Right  06/18/2010 millimeter LAD, vein to diagonal branch, obtuse marginal branch. She had resection of her thoracic aortic aneurysm utilizing a Hemashield graft under deep hypothermic circulatory arrest with spinning herniated aortic valve and reimplantation of her normal coronary arteries.

## 2016-05-26 NOTE — Patient Instructions (Signed)
Medication Instructions:  Your physician recommends that you continue on your current medications as directed. Please refer to the Current Medication list given to you today.   Labwork: Your physician recommends that you return for lab work BMET, CBC, PTT, PT/INR   Testing/Procedures: 1.   Prior to Cardioversion:  A chest x-ray takes a picture of the organs and structures inside the chest, including the heart, lungs, and blood vessels.    2.  Your physician has recommended that you have a Cardioversion (DCCV). Electrical Cardioversion uses a jolt of electricity to your heart either through paddles or wired patches attached to your chest. This is a controlled, usually prescheduled, procedure. Defibrillation is done under light anesthesia in the hospital, and you usually go home the day of the procedure. This is done to get your heart back into a normal rhythm. You are not awake for the procedure. Please see the instruction sheet given to you today.    Follow-Up: You have been referred to AFIB Clinic for evaluation after the cardioversion.  Your physician recommends that you schedule a follow-up appointment with Dr. Gwenlyn Found in 3-4 weeks after cardioversion.    Any Other Special Instructions Will Be Listed Below (If Applicable).   Electrical Cardioversion Electrical cardioversion is the delivery of a jolt of electricity to change the rhythm of the heart. Sticky patches or metal paddles are placed on the chest to deliver the electricity from a device. This is done to restore a normal rhythm. A rhythm that is too fast or not regular keeps the heart from pumping well. Electrical cardioversion is done in an emergency if:   There is low or no blood pressure as a result of the heart rhythm.   Normal rhythm must be restored as fast as possible to protect the brain and heart from further damage.   It may save a life. Cardioversion may be done for heart rhythms that are not immediately life  threatening, such as atrial fibrillation or flutter, in which:   The heart is beating too fast or is not regular.   Medicine to change the rhythm has not worked.   It is safe to wait in order to allow time for preparation.  Symptoms of the abnormal rhythm are bothersome.  The risk of stroke and other serious problems can be reduced. LET [facility] CARE PROVIDER KNOW ABOUT:   Any allergies you have.  All medicines you are taking, including vitamins, herbs, eye drops, creams, and over-the-counter medicines.  Previous problems you or members of your family have had with the use of anesthetics.   Any blood disorders you have.   Previous surgeries you have had.   Medical conditions you have. RISKS AND COMPLICATIONS  Generally, this is a safe procedure. However, problems can occur and include:   Breathing problems related to the anesthetic used.  A blood clot that breaks free and travels to other parts of your body. This could cause a stroke or other problems. The risk of this is lowered by use of blood-thinning medicine (anticoagulant) prior to the procedure.  Cardiac arrest (rare). BEFORE THE PROCEDURE   You may have tests to detect blood clots in your heart and to evaluate heart function.  You may start taking anticoagulants so your blood does not clot as easily.   Medicines may be given to help stabilize your heart rate and rhythm. PROCEDURE  You will be given medicine through an IV tube to reduce discomfort and make you sleepy (sedative).  An electrical shock will be delivered. AFTER THE PROCEDURE Your heart rhythm will be watched to make sure it does not change.    This information is not intended to replace advice given to you by your health care provider. Make sure you discuss any questions you have with your health care provider.   Document Released: 07/10/2002 Document Revised: 08/10/2014 Document Reviewed: 02/01/2013 Elsevier Interactive Patient  Education [employer].   If you need a refill on your cardiac medications before your next appointment, please call your pharmacy.

## 2016-05-26 NOTE — Assessment & Plan Note (Signed)
History of hyperlipidemia on statin therapy with recent lipid profile performed 04/03/16 revealed a total cholesterol 120, LDL of 58 and HDL 32

## 2016-05-26 NOTE — Assessment & Plan Note (Signed)
History of hypertension blood pressure is 130/74. She is on amlodipine, carvedilol and valsartan. Continue current meds at current dosing

## 2016-05-26 NOTE — Assessment & Plan Note (Signed)
History of paroxysmal atrial fibrillation status post outpatient DC cardioversion by Dr. Dorris Carnes 09/20/15 successfully to sinus rhythm. She has been on Eliquis oral anticoagulation. She's noticed increasing fatigue and dyspnea over last several weeks and is in [name] today with a controlled ventricular response. I'm going to arrange for her to undergo outpatient DC cardioversion followed by appointment in the atrial fibrillation clinic for consideration of antiarrhythmic medication.

## 2016-05-27 LAB — PROTIME-INR
INR: 1.1
Prothrombin Time: 11.4 s (ref 9.0–11.5)

## 2016-05-27 LAB — BASIC METABOLIC PANEL
BUN: 24 mg/dL (ref 7–25)
CHLORIDE: 100 mmol/L (ref 98–110)
CO2: 23 mmol/L (ref 20–31)
CREATININE: 1.1 mg/dL — AB (ref 0.60–0.93)
Calcium: 9.4 mg/dL (ref 8.6–10.4)
Glucose, Bld: 189 mg/dL — ABNORMAL HIGH (ref 65–99)
Potassium: 4.6 mmol/L (ref 3.5–5.3)
Sodium: 136 mmol/L (ref 135–146)

## 2016-05-27 LAB — APTT: APTT: 28 s (ref 22–34)

## 2016-06-05 ENCOUNTER — Ambulatory Visit (HOSPITAL_COMMUNITY): Payer: Medicare Other | Admitting: Anesthesiology

## 2016-06-05 ENCOUNTER — Encounter (HOSPITAL_COMMUNITY)
Admission: RE | Disposition: A | Discharge status: Home / Self Care | Payer: Self-pay | Source: Ambulatory Visit | Attending: Cardiovascular Disease

## 2016-06-05 ENCOUNTER — Ambulatory Visit (HOSPITAL_COMMUNITY)
Admission: RE | Admit: 2016-06-05 | Discharge: 2016-06-05 | Disposition: A | Discharge status: Home / Self Care | Payer: Medicare Other | Source: Ambulatory Visit | Attending: Cardiovascular Disease | Admitting: Cardiovascular Disease

## 2016-06-05 ENCOUNTER — Encounter (HOSPITAL_COMMUNITY): Payer: Self-pay | Admitting: *Deleted

## 2016-06-05 DIAGNOSIS — I481 Persistent atrial fibrillation: Secondary | ICD-10-CM | POA: Diagnosis not present

## 2016-06-05 DIAGNOSIS — E119 Type 2 diabetes mellitus without complications: Secondary | ICD-10-CM | POA: Insufficient documentation

## 2016-06-05 DIAGNOSIS — I252 Old myocardial infarction: Secondary | ICD-10-CM | POA: Insufficient documentation

## 2016-06-05 DIAGNOSIS — E785 Hyperlipidemia, unspecified: Secondary | ICD-10-CM | POA: Insufficient documentation

## 2016-06-05 DIAGNOSIS — E039 Hypothyroidism, unspecified: Secondary | ICD-10-CM | POA: Diagnosis not present

## 2016-06-05 DIAGNOSIS — Z79899 Other long term (current) drug therapy: Secondary | ICD-10-CM | POA: Diagnosis not present

## 2016-06-05 DIAGNOSIS — Z7982 Long term (current) use of aspirin: Secondary | ICD-10-CM | POA: Insufficient documentation

## 2016-06-05 DIAGNOSIS — I2510 Atherosclerotic heart disease of native coronary artery without angina pectoris: Secondary | ICD-10-CM | POA: Insufficient documentation

## 2016-06-05 DIAGNOSIS — K219 Gastro-esophageal reflux disease without esophagitis: Secondary | ICD-10-CM | POA: Diagnosis not present

## 2016-06-05 DIAGNOSIS — I480 Paroxysmal atrial fibrillation: Secondary | ICD-10-CM | POA: Insufficient documentation

## 2016-06-05 DIAGNOSIS — I509 Heart failure, unspecified: Secondary | ICD-10-CM | POA: Diagnosis not present

## 2016-06-05 DIAGNOSIS — I110 Hypertensive heart disease with heart failure: Secondary | ICD-10-CM | POA: Diagnosis not present

## 2016-06-05 DIAGNOSIS — I10 Essential (primary) hypertension: Secondary | ICD-10-CM | POA: Diagnosis not present

## 2016-06-05 DIAGNOSIS — I5031 Acute diastolic (congestive) heart failure: Secondary | ICD-10-CM | POA: Diagnosis not present

## 2016-06-05 HISTORY — PX: CARDIOVERSION: SHX1299

## 2016-06-05 LAB — GLUCOSE, CAPILLARY: Glucose-Capillary: 177 mg/dL — ABNORMAL HIGH (ref 65–99)

## 2016-06-05 SURGERY — CARDIOVERSION
Anesthesia: General

## 2016-06-05 MED ORDER — PROPOFOL 10 MG/ML IV BOLUS
INTRAVENOUS | Status: DC | PRN
  Administered 2016-06-05: 20 mg via INTRAVENOUS
  Administered 2016-06-05: 50 mg via INTRAVENOUS

## 2016-06-05 MED ORDER — SODIUM CHLORIDE 0.9 % IV SOLN
INTRAVENOUS | Status: DC
  Administered 2016-06-05: 09:00:00 via INTRAVENOUS

## 2016-06-05 NOTE — Transfer of Care (Signed)
Immediate Anesthesia Transfer of Care Note  Patient: Lindsay Nguyen  Procedure(s) Performed: Procedure(s): CARDIOVERSION (N/A)  Patient Location: Endoscopy Unit  Anesthesia Type:General  Level of Consciousness: sedated  Airway & Oxygen Therapy: Patient Spontanous Breathing and Patient connected to nasal cannula oxygen  Post-op Assessment: Report given to RN and Post -op Vital signs reviewed and stable  Post vital signs: Reviewed and stable  Last Vitals:  Vitals:   06/05/16 0905  BP: (!) 168/85  Pulse: 76  Resp: 20  Temp: 36.7 C    Last Pain:  Vitals:   06/05/16 0905  TempSrc: Oral         Complications: No apparent anesthesia complications

## 2016-06-05 NOTE — Anesthesia Postprocedure Evaluation (Signed)
Anesthesia Post Note  Patient: Lindsay Nguyen  Procedure(s) Performed: Procedure(s) (LRB): CARDIOVERSION (N/A)  Patient location during evaluation: Endoscopy Anesthesia Type: General Level of consciousness: awake Pain management: pain level controlled Vital Signs Assessment: post-procedure vital signs reviewed and stable Respiratory status: spontaneous breathing Cardiovascular status: stable Postop Assessment: no signs of nausea or vomiting Anesthetic complications: no    Last Vitals:  Vitals:   06/05/16 1000 06/05/16 1010  BP: (!) 146/72 (!) 149/68  Pulse: (!) 54 (!) 53  Resp: 12 (!) 21  Temp:      Last Pain:  Vitals:   06/05/16 0950  TempSrc: Oral                 [name]

## 2016-06-05 NOTE — H&P (View-Only) (Signed)
05/26/2016 [place]   1936/04/19  [id number]  Primary Physician Odette Fraction, MD Primary Cardiologist: [name] MD [name]  HPI:  The patient returns today for  Followup. I recently saw her in the office 11/06/15. She is a pleasant, 80 year old, mildly overweight, married Caucasian female, mother of 2, grandmother to 2 grandchildren accompanied by her son and daughter.. Her risk factors include family history, hypertension, hyperlipidemia and noninsulin-requiring diabetes. She had a non-ST-segment-elevation myocardial infarction March 21, 2010, with peak troponin of 4. I brought her to the cath lab the following day and stented her proximal LAD with a bare-metal stent. She did have nonobstructive disease beyond the stent as well as a diagonal branch, AV groove circumflex with an EF of 45% and anteroapical akinesia. Because of a generous thoracic aorta on cath, I performed a CT angiogram on her revealing her thoracic aorta which measured 4.9 cm. She was seen by Dr. Gilford Raid for evaluation of this at my request. Myoview performed June 11, 2010, showed apical and inferior ischemia as well as anterior ischemia. I noted her June 17, 2010, with rapid onset dyspnea relieved with sublingual nitroglycerin. The patient ruled out for myocardial infarction and was heparinized. I ultimately cath'd her November 16 revealing aggressive "in-stent restenosis" within the proximal LAD stent, as well as in her diagonal branch and AV groove circumflex.   She ultimately underwent coronary artery bypass grafting by Dr. Gilford Raid with LIMA to her LAD, a vein to a diagonal branch, as well as obtuse marginal branch with resection and grafting of her thoracic aortic aneurysm using a 20-mm supracoronary Hemashield 2 graft utilizing deep hypothermic circulatory arrest with resuspension of her native aortic valve and reimplantation of her native coronary arteries. Her postop  course was complicated and prolonged, lasting 4 weeks. She did have atrial fibrillation, pneumonia, cholecystitis status post cholecystectomy, as well as pleural effusions. She had a percutaneous drain of her gallbladder and ultimately cholecystectomy by Dr. Excell Seltzer September 18, 2010. She recuperated nicely. She had a Myoview performed November 04, 2010, which was nonischemic and a 2D echo that showed normal LV systolic function with a normal functioning aortic valve.  When I saw her last she was in atrial fibrillation which was confirmed on 30 day event monitoring with tachybradycardia syndrome. She was placed on oral anticoagulation and ultimately underwent successful outpatient cardioversion by Dr. Dorris Carnes 09/30/15. She remains in sinus rhythm/sinus bradycardia. She was hospitalized in [place] 10/15/15 for 2 days with upper respiratory tract infection. She did have diastolic heart failure that time with a BNP of greater than 2000 and was diuresed. A 2-D echo did show moderate to severe pulmonary hypertension without PA systolic pressure of 0000000 mmHg and septal flattening. Apparently a pulmonary embolus was ruled out at that time. A subsequent 2-D echo performed 11/25/15 showed normal pulmonary artery pressures and normal LV function. She did have an outpatient sleep study that did not show sleep apnea. She's had increasing fatigue and dyspnea over the last several weeks.   Current Outpatient Prescriptions  Medication Sig Dispense Refill  . amLODipine (NORVASC) 2.5 MG tablet Take 1 tablet (2.5 mg total) by mouth daily. 90 tablet 1  . aspirin EC 81 MG tablet Take 1 tablet (81 mg total) by mouth daily. 90 tablet 3  . atorvastatin (LIPITOR) 10 MG tablet Take 1 tablet (10 mg total) by mouth daily at 6 PM. 90 tablet 1  . budesonide (PULMICORT)  180 MCG/ACT inhaler Inhale 2 puffs into the lungs daily at 12 noon. 3 each 1  . carvedilol (COREG) 12.5 MG tablet TAKE 1&1/2 TABLETS BY MOUTH EVERY  MORNING AND 1 TABLET EVERY EVENING 225 tablet 2  . ELIQUIS 5 MG TABS tablet TAKE 1 TABLET(5 MG) BY MOUTH TWICE DAILY 180 tablet 0  . escitalopram (LEXAPRO) 10 MG tablet Take 1 tablet (10 mg total) by mouth daily. 90 tablet 1  . furosemide (LASIX) 40 MG tablet TAKE 1 TABLET DAILY AS NEEDED FOR FLUID 90 tablet 1  . glimepiride (AMARYL) 2 MG tablet Take 1 tablet (2 mg total) by mouth 2 (two) times daily. 180 tablet 1  . KOMBIGLYZE XR 12-998 MG TB24 Take 1 tablet by mouth at bedtime.  0  . levothyroxine (SYNTHROID, LEVOTHROID) 112 MCG tablet Take 0.5 tablets (56 mcg total) by mouth daily before breakfast. 1/2 tab daily, BRAND only per Dr palel 90 tablet 3  . montelukast (SINGULAIR) 10 MG tablet Take 1 tablet (10 mg total) by mouth at bedtime. 90 tablet 1  . naproxen sodium (ANAPROX) 220 MG tablet Take 220 mg by mouth as needed (PAIN, HEADACHE).     . nitroGLYCERIN (NITROSTAT) 0.4 MG SL tablet Place 0.4 mg under the tongue every 5 (five) minutes as needed for chest pain.     . saxagliptin HCl (ONGLYZA) 2.5 MG TABS tablet Take 1 tablet (2.5 mg total) by mouth daily. 90 tablet 1  . traZODone (DESYREL) 50 MG tablet Take 1 tablet (50 mg total) by mouth at bedtime as needed. 90 tablet 1  . valsartan (DIOVAN) 160 MG tablet Take 1 tablet (160 mg total) by mouth daily. 90 tablet 0   No current facility-administered medications for this visit.     Allergies  Allergen Reactions  . Iodine Anaphylaxis and Swelling  . Contrast Media [Iodinated Diagnostic Agents]   . Gabapentin Hives  . Shellfish Allergy     Social History   Social History  . Marital status: Married    Spouse name: N/A  . Number of children: N/A  . Years of education: N/A   Occupational History  . Not on file.   Social History Main Topics  . Smoking status: Never Smoker  . Smokeless tobacco: Never Used  . Alcohol use No  . Drug use: No  . Sexual activity: Not on file   Other Topics Concern  . Not on file   Social History  Narrative  . No narrative on file     Review of Systems: General: negative for chills, fever, night sweats or weight changes.  Cardiovascular: negative for chest pain, dyspnea on exertion, edema, orthopnea, palpitations, paroxysmal nocturnal dyspnea or shortness of breath Dermatological: negative for rash Respiratory: negative for cough or wheezing Urologic: negative for hematuria Abdominal: negative for nausea, vomiting, diarrhea, bright red blood per rectum, melena, or hematemesis Neurologic: negative for visual changes, syncope, or dizziness All other systems reviewed and are otherwise negative except as noted above.    Blood pressure 130/74, pulse 85, height 5\' 9"  (1.753 m), weight 211 lb (95.7 kg).  General appearance: alert and no distress Neck: no adenopathy, no carotid bruit, no JVD, supple, symmetrical, trachea midline and thyroid not enlarged, symmetric, no tenderness/mass/nodules Lungs: clear to auscultation bilaterally Heart: irregularly irregular rhythm Extremities: extremities normal, atraumatic, no cyanosis or edema  EKG atrial fibrillation with a ventricular response of 85 and left bundle branch block. I personally reviewed this EKG  ASSESSMENT AND PLAN:   HLD (  hyperlipidemia) History of hyperlipidemia on statin therapy with recent lipid profile performed 04/03/16 revealed a total cholesterol 120, LDL of 58 and HDL 32  Essential hypertension History of hypertension blood pressure is 130/74. She is on amlodipine, carvedilol and valsartan. Continue current meds at current dosing  Hx of CABG History of CAD status post non-ST segment elevation myocardial infarction 03/21/2010 treated with stenting of her proximal LAD. Her EF at that time was 45%. She had a large thoracic aortic aneurysm. She also underwent coronary artery bypass grafting and thoracic aortic aneurysm repair by Dr. Arvid Right  06/18/2010 millimeter LAD, vein to diagonal branch, obtuse marginal branch. She  had resection of her thoracic aortic aneurysm utilizing a Hemashield graft under deep hypothermic circulatory arrest with spinning herniated aortic valve and reimplantation of her normal coronary arteries.  PAF (paroxysmal atrial fibrillation) (HCC) History of paroxysmal atrial fibrillation status post outpatient DC cardioversion by Dr. Dorris Carnes 09/20/15 successfully to sinus rhythm. She has been on Eliquis oral anticoagulation. She's noticed increasing fatigue and dyspnea over last several weeks and is in [name] today with a controlled ventricular response. I'm going to arrange for her to undergo outpatient DC cardioversion followed by appointment in the atrial fibrillation clinic for consideration of antiarrhythmic medication.      [name] MD FACP,FACC,FAHA, [facility] 05/26/2016 2:33 PM

## 2016-06-05 NOTE — Discharge Instructions (Signed)
Electrical Cardioversion, Care After °Refer to this sheet in the next few weeks. These instructions provide you with information on caring for yourself after your procedure. Your health care provider may also give you more specific instructions. Your treatment has been planned according to current medical practices, but problems sometimes occur. Call your health care provider if you have any problems or questions after your procedure. °WHAT TO EXPECT AFTER THE PROCEDURE °After your procedure, it is typical to have the following sensations: °· Some redness on the skin where the shocks were delivered. If this is tender, a sunburn lotion or hydrocortisone cream may help. °· Possible return of an abnormal heart rhythm within hours or days after the procedure. °HOME CARE INSTRUCTIONS °· Take medicines only as directed by your health care provider. Be sure you understand how and when to take your medicine. °· Learn how to feel your pulse and check it often. °· Limit your activity for 48 hours after the procedure or as directed by your health care provider. °· Avoid or minimize caffeine and other stimulants as directed by your health care provider. °SEEK MEDICAL CARE IF: °· You feel like your heart is beating too fast or your pulse is not regular. °· You have any questions about your medicines. °· You have bleeding that will not stop. °SEEK IMMEDIATE MEDICAL CARE IF: °· You are dizzy or feel faint. °· It is hard to breathe or you feel short of breath. °· There is a change in discomfort in your chest. °· Your speech is slurred or you have trouble moving an arm or leg on one side of your body. °· You get a serious muscle cramp that does not go away. °· Your fingers or toes turn cold or blue. °  °This information is not intended to replace advice given to you by your health care provider. Make sure you discuss any questions you have with your health care provider. °  °Document Released: 05/10/2013 Document Revised: 08/10/2014  Document Reviewed: 05/10/2013 °Elsevier Interactive Patient Education ©2016 [employer] ° °

## 2016-06-05 NOTE — Op Note (Signed)
Procedure: Electrical Cardioversion Indications:  Atrial Fibrillation  Procedure Details:  Consent: Risks of procedure as well as the alternatives and risks of each were explained to the (patient/caregiver).  Consent for procedure obtained.  Time Out: Verified patient identification, verified procedure, site/side was marked, verified correct patient position, special equipment/implants available, medications/allergies/relevent history reviewed, required imaging and test results available.  Performed  Patient placed on cardiac monitor, pulse oximetry, supplemental oxygen as necessary.  Sedation given: IV propofol Pacer pads placed anterior and posterior chest.  Cardioverted 1 time(s).  Cardioversion with synchronized biphasic 120J shock.  Evaluation: Findings: Post procedure EKG shows: sinus bradycardia with long 1st degree AV block, occasional periods of second degree AV block with brief  bursts of nonsustained atrial tachycardia Complications: None Patient did tolerate procedure well.  Time Spent Directly with the Patient:  30 minutes   Lindsay Nguyen 06/05/2016, 9:53 AM

## 2016-06-05 NOTE — Interval H&P Note (Signed)
History and Physical Interval Note:  06/05/2016 9:21 AM  Lindsay Nguyen  has presented today for surgery, with the diagnosis of afib  The various methods of treatment have been discussed with the patient and family. After consideration of risks, benefits and other options for treatment, the patient has consented to  Procedure(s): CARDIOVERSION (N/A) as a surgical intervention .  The patient's history has been reviewed, patient examined, no change in status, stable for surgery.  I have reviewed the patient's chart and labs.  Questions were answered to the patient's satisfaction.     Lindsay Nguyen

## 2016-06-05 NOTE — Anesthesia Preprocedure Evaluation (Addendum)
Anesthesia Evaluation  Patient identified by MRN, date of birth, ID band Patient awake    Reviewed: Allergy & Precautions, NPO status , Patient's Chart, lab work & pertinent test results  History of Anesthesia Complications Negative for: history of anesthetic complications  Airway Mallampati: II  TM Distance: >3 FB Neck ROM: Full    Dental  (+) Teeth Intact, Dental Advisory Given   Pulmonary asthma ,    breath sounds clear to auscultation       Cardiovascular hypertension, + CAD, + Past MI and +CHF  + dysrhythmias Atrial Fibrillation  Rhythm:Irregular     Neuro/Psych    GI/Hepatic GERD  ,  Endo/Other  diabetes, Well Controlled, Type 2Hypothyroidism   Renal/GU      Musculoskeletal   Abdominal   Peds  Hematology   Anesthesia Other Findings   Reproductive/Obstetrics                            Anesthesia Physical Anesthesia Plan  ASA: III  Anesthesia Plan: General   Post-op Pain Management:    Induction: Intravenous  Airway Management Planned: Mask  Additional Equipment: None  Intra-op Plan:   Post-operative Plan:   Informed Consent: I have reviewed the patients History and Physical, chart, labs and discussed the procedure including the risks, benefits and alternatives for the proposed anesthesia with the patient or authorized representative who has indicated his/her understanding and acceptance.   Dental advisory given  Plan Discussed with: CRNA and Surgeon  Anesthesia Plan Comments:         Anesthesia Quick Evaluation

## 2016-06-08 ENCOUNTER — Encounter (HOSPITAL_COMMUNITY): Payer: Self-pay | Admitting: Cardiovascular Disease

## 2016-06-16 ENCOUNTER — Ambulatory Visit (HOSPITAL_COMMUNITY)
Admission: RE | Admit: 2016-06-16 | Discharge: 2016-06-16 | Disposition: A | Discharge status: Home / Self Care | Payer: Medicare Other | Source: Ambulatory Visit | Attending: Nurse Practitioner | Admitting: Nurse Practitioner

## 2016-06-16 ENCOUNTER — Encounter (HOSPITAL_COMMUNITY): Payer: Self-pay | Admitting: Nurse Practitioner

## 2016-06-16 VITALS — BP 118/70 | HR 62 | Ht 69.0 in | Wt 214.2 lb

## 2016-06-16 DIAGNOSIS — I10 Essential (primary) hypertension: Secondary | ICD-10-CM | POA: Insufficient documentation

## 2016-06-16 DIAGNOSIS — E039 Hypothyroidism, unspecified: Secondary | ICD-10-CM | POA: Diagnosis not present

## 2016-06-16 DIAGNOSIS — K219 Gastro-esophageal reflux disease without esophagitis: Secondary | ICD-10-CM | POA: Insufficient documentation

## 2016-06-16 DIAGNOSIS — I480 Paroxysmal atrial fibrillation: Secondary | ICD-10-CM | POA: Diagnosis not present

## 2016-06-16 DIAGNOSIS — I2510 Atherosclerotic heart disease of native coronary artery without angina pectoris: Secondary | ICD-10-CM | POA: Insufficient documentation

## 2016-06-16 DIAGNOSIS — E119 Type 2 diabetes mellitus without complications: Secondary | ICD-10-CM | POA: Diagnosis not present

## 2016-06-16 DIAGNOSIS — Z7901 Long term (current) use of anticoagulants: Secondary | ICD-10-CM | POA: Diagnosis not present

## 2016-06-16 DIAGNOSIS — E785 Hyperlipidemia, unspecified: Secondary | ICD-10-CM | POA: Insufficient documentation

## 2016-06-16 DIAGNOSIS — M8580 Other specified disorders of bone density and structure, unspecified site: Secondary | ICD-10-CM | POA: Diagnosis not present

## 2016-06-16 DIAGNOSIS — I252 Old myocardial infarction: Secondary | ICD-10-CM | POA: Insufficient documentation

## 2016-06-16 DIAGNOSIS — Z79899 Other long term (current) drug therapy: Secondary | ICD-10-CM | POA: Insufficient documentation

## 2016-06-16 DIAGNOSIS — Z7982 Long term (current) use of aspirin: Secondary | ICD-10-CM | POA: Insufficient documentation

## 2016-06-18 NOTE — Progress Notes (Signed)
Primary Care Physician: Odette Fraction, MD Referring Physician: Dr. Kerry Dory Lindsay Nguyen is a 80 y.o. female with a h/o CAD PAF that is in the afib clinic for evaluation.She had a brief episode of afib in 2011 that spontaneously converted and did not have any other issues until February of this year. She was symptomatic with fatigue/palpitations with afib. She had a cardioversion and it held until this October and she had another successful cardioversion in 06/05/16. She was sent here by Dr. Gwenlyn Found to discuss antiarrhythmics. Pt has a first degree block and LAFB at baseline and some EKG's have shown some AV dissociation. No alcohol,caffeine, tobacco use. Sleep study negative.  Today, she denies symptoms of palpitations, chest pain, shortness of breath, orthopnea, PND, lower extremity edema, dizziness, presyncope, syncope, or neurologic sequela. The patient is tolerating medications without difficulties and is otherwise without complaint today.   Past Medical History:  Diagnosis Date  . Anemia   . Atrial fibrillation ([place])    persistent  . CAD (coronary artery disease)    2D ECHO, 11/04/2010 - EF >55%, mild-moderate mitral regurgitation, moderate tricuspid regurgitation  . Diabetes mellitus   . GERD (gastroesophageal reflux disease)   . Hyperlipidemia   . Hypertension   . Non-STEMI (non-ST elevated myocardial infarction) (HCC)    NUCLEAR STRESS TEST, 11/04/2010 - normal, no ischemia  . Osteopenia   . Pain in limb    LEA VENOUS DUPLEX, 06/27/2010 - no evidence of DVT  . Thoracic aortic aneurysm (HCC)    status post resection and grafting  . Thyroid disease    hypothyroid   Past Surgical History:  Procedure Laterality Date  . CARDIAC CATHETERIZATION  06/18/2010   Recommended CABG  . CARDIOVERSION N/A 09/30/2015   Procedure: CARDIOVERSION;  Surgeon: [name], MD;  Location[facility] ENDOSCOPY;  Service: Cardiovascular;  Laterality: N/A;  . CARDIOVERSION N/A 06/05/2016   Procedure:  CARDIOVERSION;  Surgeon: [name], MD;  Location: [facility] ENDOSCOPY;  Service: Cardiovascular;  Laterality: N/A;  . CHOLECYSTECTOMY    . CORONARY ARTERY BYPASS GRAFT  06/18/2010   LIMA-LAD, vein-diagonal branch, vein-obtuse marginal branch, and resectioning and grafting of throacic aortic aneurysm  . ELBOW SURGERY  1979   fracture left  . TONSILLECTOMY  1945  . TUBAL LIGATION  1971    Current Outpatient Prescriptions  Medication Sig Dispense Refill  . albuterol (PROVENTIL) (2.5 MG/3ML) 0.083% nebulizer solution INHALE 1 NEBULE VIA NEBULIZER Q 2 HOURS PRN FOR WHEEZING OR SHORTNESS OF BREATH  5  . amLODipine (NORVASC) 2.5 MG tablet Take 1 tablet (2.5 mg total) by mouth daily. 90 tablet 1  . aspirin EC 81 MG tablet Take 1 tablet (81 mg total) by mouth daily. 90 tablet 3  . atorvastatin (LIPITOR) 10 MG tablet Take 1 tablet (10 mg total) by mouth daily at 6 PM. 90 tablet 1  . budesonide (PULMICORT) 180 MCG/ACT inhaler Inhale 2 puffs into the lungs daily at 12 noon. (Patient taking differently: Inhale 2 puffs into the lungs as needed. ) 3 each 1  . carvedilol (COREG) 12.5 MG tablet TAKE 1&1/2 TABLETS BY MOUTH EVERY MORNING AND 1 TABLET EVERY EVENING 225 tablet 2  . ELIQUIS 5 MG TABS tablet TAKE 1 TABLET(5 MG) BY MOUTH TWICE DAILY 180 tablet 0  . escitalopram (LEXAPRO) 10 MG tablet Take 1 tablet (10 mg total) by mouth daily. 90 tablet 1  . furosemide (LASIX) 40 MG tablet TAKE 1 TABLET DAILY AS NEEDED FOR  FLUID 90 tablet 1  . glimepiride (AMARYL) 2 MG tablet Take 1 tablet (2 mg total) by mouth 2 (two) times daily. 180 tablet 1  . levothyroxine (SYNTHROID, LEVOTHROID) 112 MCG tablet Take 0.5 tablets (56 mcg total) by mouth daily before breakfast. 1/2 tab daily, BRAND only per Dr palel 90 tablet 3  . nitroGLYCERIN (NITROSTAT) 0.4 MG SL tablet Place 0.4 mg under the tongue every 5 (five) minutes as needed for chest pain.     . saxagliptin HCl (ONGLYZA) 2.5 MG TABS tablet Take 1 tablet (2.5 mg total) by  mouth daily. 90 tablet 1  . traZODone (DESYREL) 50 MG tablet Take 1 tablet (50 mg total) by mouth at bedtime as needed. 90 tablet 1  . valsartan (DIOVAN) 160 MG tablet Take 1 tablet (160 mg total) by mouth daily. 90 tablet 0   No current facility-administered medications for this encounter.     Allergies  Allergen Reactions  . Iodine Anaphylaxis and Swelling  . Contrast Media [Iodinated Diagnostic Agents]   . Gabapentin Hives  . Shellfish Allergy     Social History   Social History  . Marital status: Married    Spouse name: N/A  . Number of children: N/A  . Years of education: N/A   Occupational History  . Not on file.   Social History Main Topics  . Smoking status: Never Smoker  . Smokeless tobacco: Never Used  . Alcohol use No  . Drug use: No  . Sexual activity: Not on file   Other Topics Concern  . Not on file   Social History Narrative  . No narrative on file    Family History  Problem Relation Age of Onset  . Hypertension Mother   . Osteoporosis Mother   . Heart disease Father   . Stroke Father   . Alzheimer's disease Father   . Heart attack Father     ROS- All systems are reviewed and negative except as per the HPI above  Physical Exam: Vitals:   06/16/16 1339  BP: 118/70  Pulse: 62  Weight: 214 lb 3.2 oz (97.2 kg)  Height: 5\' 9"  (1.753 m)    GEN- The patient is well appearing, alert and oriented x 3 today.   Head- normocephalic, atraumatic Eyes-  Sclera clear, conjunctiva pink Ears- hearing intact Oropharynx- clear Neck- supple, no JVP Lymph- no cervical lymphadenopathy Lungs- Clear to ausculation bilaterally, normal work of breathing Heart- Regular rate and rhythm, no murmurs, rubs or gallops, PMI not laterally displaced GI- soft, NT, ND, + BS Extremities- no clubbing, cyanosis, or edema MS- no significant deformity or atrophy Skin- no rash or lesion Psych- euthymic mood, full affect Neuro- strength and sensation are intact  EKG-  Junctional vrs first degree AV block at 62 bpm, LAFB, LVH with qrs widening, cannot rule out septal infarct Previous EKG's reviewed and showed some evidence of  AV association Labs reviewed Echo- 11/25/15-Study Conclusions  - Left ventricle: No obvious RWMA Poor endocardial definition The   estimated ejection fraction was 55%. - Mitral valve: Calcified annulus. - Left atrium: The atrium was mildly dilated. - Right atrium: The atrium was mildly dilated. - Atrial septum: No defect or patent foramen ovale was identified. - Pulmonary arteries: PA peak pressure: 36 mm Hg (S).   Assessment and Plan: 1. Paroxysmal afib  Successful cardioversion 11/3 as well in February of this year. Options re antiarrythmic's if pt should go back into afib discussed Due to conduction issues at baseline  and CAD, 1c agents are not an option Reviewed with Dr. Rayann Heman and he feels best antiarrythmic, due to baseline conduction issues would be tikosyn However, she is on an antidepressant and this in combo with Sandria Manly would cause concern  for prolonged QTC. The pt/son state that it would be impossible for pt to stop antidepressant  She would not be an ablation candidate  for  fear of causing  severe AV Block and resulting in need for pacemaker, which Dr. Rayann Heman feels that this may be in her future at some point Therefore, no good options to suggest at this visit If pt should go back into afib, will refer to Dr. Rayann Heman to further discuss Pt/son understand Continue carvedilol Continue eliquis 5 mg bid  F/u afib clinic as needed  Butch Penny C. Carroll, [facility] [address] [place], [place] [address] [phone]

## 2016-07-08 ENCOUNTER — Ambulatory Visit (INDEPENDENT_AMBULATORY_CARE_PROVIDER_SITE_OTHER): Payer: Medicare Other | Admitting: Cardiovascular Disease

## 2016-07-08 ENCOUNTER — Encounter: Payer: Self-pay | Admitting: Cardiovascular Disease

## 2016-07-08 VITALS — BP 132/68 | HR 58 | Ht 68.5 in | Wt 213.0 lb

## 2016-07-08 DIAGNOSIS — I10 Essential (primary) hypertension: Secondary | ICD-10-CM | POA: Diagnosis not present

## 2016-07-08 DIAGNOSIS — I481 Persistent atrial fibrillation: Secondary | ICD-10-CM | POA: Diagnosis not present

## 2016-07-08 DIAGNOSIS — I4819 Other persistent atrial fibrillation: Secondary | ICD-10-CM

## 2016-07-08 NOTE — Assessment & Plan Note (Signed)
Lindsay Nguyen returns after her recent outpatient DC cardioversion performed at my request by Dr. Sallyanne Kuster on 06/05/16. She was converted with 1 biphasic 120 J shock. She was on oral anticoagulation. She remains in sinus rhythm. She did see Roderic Palau back in the [name] clinic 06/16/16 to discuss antiarrhythmic medication versus [name] ablation versus continued medical therapy. Dr. Rayann Heman felt she was not a good candidate for [name] ablation. The only drug that they thought was Tikosyn  although this interacted with her anti-depressant medications. She feels clearly improved in sinus rhythm.

## 2016-07-08 NOTE — Progress Notes (Signed)
07/08/2016 [place]   05-27-1936  [id number]  Primary Physician Odette Fraction, MD Primary Cardiologist: [name] MD Lindsay Nguyen  HPI:  The patient returns today for Followup. I recently saw her in the office 11/06/15. She is a pleasant, 80 year old, mildly overweight, married Caucasian female, mother of 2, grandmother to 2 grandchildren accompanied by her son and daughter.. Her risk factors include family history, hypertension, hyperlipidemia and noninsulin-requiring diabetes. She had a non-ST-segment-elevation myocardial infarction March 21, 2010, with peak troponin of 4. I brought her to the cath lab the following day and stented her proximal LAD with a bare-metal stent. She did have nonobstructive disease beyond the stent as well as a diagonal branch, AV groove circumflex with an EF of 45% and anteroapical akinesia. Because of a generous thoracic aorta on cath, I performed a CT angiogram on her revealing her thoracic aorta which measured 4.9 cm. She was seen by Dr. Gilford Raid for evaluation of this at my request. Myoview performed June 11, 2010, showed apical and inferior ischemia as well as anterior ischemia. I noted her June 17, 2010, with rapid onset dyspnea relieved with sublingual nitroglycerin. The patient ruled out for myocardial infarction and was heparinized. I ultimately cath'd her November 16 revealing aggressive "in-stent restenosis" within the proximal LAD stent, as well as in her diagonal branch and AV groove circumflex.   She ultimately underwent coronary artery bypass grafting by Dr. Gilford Raid with LIMA to her LAD, a vein to a diagonal branch, as well as obtuse marginal branch with resection and grafting of her thoracic aortic aneurysm using a 20-mm supracoronary Hemashield 2 graft utilizing deep hypothermic circulatory arrest with resuspension of her native aortic valve and reimplantation of her native coronary arteries. Her postop course  was complicated and prolonged, lasting 4 weeks. She did have atrial fibrillation, pneumonia, cholecystitis status post cholecystectomy, as well as pleural effusions. She had a percutaneous drain of her gallbladder and ultimately cholecystectomy by Dr. Excell Seltzer September 18, 2010. She recuperated nicely. She had a Myoview performed November 04, 2010, which was nonischemic and a 2D echo that showed normal LV systolic function with a normal functioning aortic valve.  When I saw her last she was in atrial fibrillation which was confirmed on 30 day event monitoring with tachybradycardia syndrome. She was placed on oral anticoagulation and ultimately underwent successful outpatient cardioversion by Dr. Dorris Carnes 09/30/15. She remains in sinus rhythm/sinus bradycardia. She was hospitalized in [place] 10/15/15 for 2 days with upper respiratory tract infection. She did have diastolic heart failure that time with a BNP of greater than 2000 and was diuresed. A 2-D echo did show moderate to severe pulmonary hypertension without PA systolic pressure of 0000000 mmHg and septal flattening. Apparently a pulmonary embolus was ruled out at that time. A subsequent 2-D echo performed 11/25/15 showed normal pulmonary artery pressures and normal LV function. She did have an outpatient sleep study that did not show sleep apnea. She's had increasing fatigue and dyspnea over the last several weeks. Because she has been symptomatic in [name] I'll arrange for her to undergo outpatient DC cardioversion. This occurred successfully performed by Dr. Sallyanne Kuster on 06/05/16 and she was seen by Roderic Palau in the [name] clinic on 06/16/16. She remains in sinus rhythm today with more energy.   Current Outpatient Prescriptions  Medication Sig Dispense Refill  . albuterol (PROVENTIL) (2.5 MG/3ML) 0.083% nebulizer solution INHALE 1 NEBULE VIA NEBULIZER Q 2  HOURS PRN FOR WHEEZING OR SHORTNESS OF BREATH  5  . amLODipine (NORVASC) 2.5 MG  tablet Take 1 tablet (2.5 mg total) by mouth daily. 90 tablet 1  . aspirin EC 81 MG tablet Take 1 tablet (81 mg total) by mouth daily. 90 tablet 3  . atorvastatin (LIPITOR) 10 MG tablet Take 1 tablet (10 mg total) by mouth daily at 6 PM. 90 tablet 1  . budesonide (PULMICORT) 0.25 MG/2ML nebulizer solution Take 0.25 mg by nebulization 2 (two) times daily.    . carvedilol (COREG) 12.5 MG tablet TAKE 1&1/2 TABLETS BY MOUTH EVERY MORNING AND 1 TABLET EVERY EVENING 225 tablet 2  . ELIQUIS 5 MG TABS tablet TAKE 1 TABLET(5 MG) BY MOUTH TWICE DAILY 180 tablet 0  . escitalopram (LEXAPRO) 10 MG tablet Take 1 tablet (10 mg total) by mouth daily. 90 tablet 1  . furosemide (LASIX) 40 MG tablet TAKE 1 TABLET DAILY AS NEEDED FOR FLUID 90 tablet 1  . glimepiride (AMARYL) 2 MG tablet Take 1 tablet (2 mg total) by mouth 2 (two) times daily. 180 tablet 1  . levothyroxine (SYNTHROID, LEVOTHROID) 112 MCG tablet Take 0.5 tablets (56 mcg total) by mouth daily before breakfast. 1/2 tab daily, BRAND only per Dr palel 90 tablet 3  . nitroGLYCERIN (NITROSTAT) 0.4 MG SL tablet Place 0.4 mg under the tongue every 5 (five) minutes as needed for chest pain.     . saxagliptin HCl (ONGLYZA) 2.5 MG TABS tablet Take 1 tablet (2.5 mg total) by mouth daily. 90 tablet 1  . traZODone (DESYREL) 50 MG tablet Take 1 tablet (50 mg total) by mouth at bedtime as needed. 90 tablet 1  . valsartan (DIOVAN) 160 MG tablet Take 1 tablet (160 mg total) by mouth daily. 90 tablet 0   No current facility-administered medications for this visit.     Allergies  Allergen Reactions  . Iodine Anaphylaxis and Swelling  . Contrast Media [Iodinated Diagnostic Agents]   . Gabapentin Hives  . Shellfish Allergy     Social History   Social History  . Marital status: Married    Spouse name: N/A  . Number of children: N/A  . Years of education: N/A   Occupational History  . Not on file.   Social History Main Topics  . Smoking status: Never Smoker   . Smokeless tobacco: Never Used  . Alcohol use No  . Drug use: No  . Sexual activity: Not on file   Other Topics Concern  . Not on file   Social History Narrative  . No narrative on file     Review of Systems: General: negative for chills, fever, night sweats or weight changes.  Cardiovascular: negative for chest pain, dyspnea on exertion, edema, orthopnea, palpitations, paroxysmal nocturnal dyspnea or shortness of breath Dermatological: negative for rash Respiratory: negative for cough or wheezing Urologic: negative for hematuria Abdominal: negative for nausea, vomiting, diarrhea, bright red blood per rectum, melena, or hematemesis Neurologic: negative for visual changes, syncope, or dizziness All other systems reviewed and are otherwise negative except as noted above.    Blood pressure 132/68, pulse (!) 58, height 5' 8.5" (1.74 m), weight 213 lb (96.6 kg).  General appearance: alert and no distress Neck: no adenopathy, no carotid bruit, no JVD, supple, symmetrical, trachea midline and thyroid not enlarged, symmetric, no tenderness/mass/nodules Lungs: clear to auscultation bilaterally Heart: regular rate and rhythm, S1, S2 normal, no murmur, click, rub or gallop Extremities: extremities normal, atraumatic, no cyanosis or  edema  EKG sinus bradycardia at 58 with left anterior vesicular block and QRS widening. I personally reviewed this EKG  ASSESSMENT AND PLAN:   Persistent atrial fibrillation [facility]) Miss Bhat returns after her recent outpatient DC cardioversion performed at my request by Dr. Sallyanne Kuster on 06/05/16. She was converted with 1 biphasic 120 J shock. She was on oral anticoagulation. She remains in sinus rhythm. She did see Roderic Palau back in the [name] clinic 06/16/16 to discuss antiarrhythmic medication versus [name] ablation versus continued medical therapy. Dr. Rayann Heman felt she was not a good candidate for [name] ablation. The only drug that they thought was Tikosyn   although this interacted with her anti-depressant medications. She feels clearly improved in sinus rhythm.      [name] MD FACP,FACC,FAHA, [facility] 07/08/2016 2:13 PM

## 2016-07-08 NOTE — Patient Instructions (Signed)
Medication Instructions: Your physician recommends that you continue on your current medications as directed. Please refer to the Current Medication list given to you today.   Follow-Up: We request that you follow-up in: 3 months with an extender and in 6 months with Dr Berry  You will receive a reminder letter in the mail two months in advance. If you don't receive a letter, please call our office to schedule the follow-up appointment.   If you need a refill on your cardiac medications before your next appointment, please call your pharmacy.  

## 2016-08-26 ENCOUNTER — Other Ambulatory Visit: Payer: Self-pay | Admitting: Cardiovascular Disease

## 2016-09-15 ENCOUNTER — Encounter: Payer: Self-pay | Admitting: Family Medicine

## 2016-09-15 ENCOUNTER — Ambulatory Visit (INDEPENDENT_AMBULATORY_CARE_PROVIDER_SITE_OTHER): Payer: Medicare Other | Admitting: Family Medicine

## 2016-09-15 VITALS — BP 132/66 | HR 76 | Temp 98.0°F | Resp 20 | Wt 214.0 lb

## 2016-09-15 DIAGNOSIS — E119 Type 2 diabetes mellitus without complications: Secondary | ICD-10-CM

## 2016-09-15 DIAGNOSIS — Z8673 Personal history of transient ischemic attack (TIA), and cerebral infarction without residual deficits: Secondary | ICD-10-CM

## 2016-09-15 DIAGNOSIS — R413 Other amnesia: Secondary | ICD-10-CM

## 2016-09-15 LAB — COMPLETE METABOLIC PANEL WITH GFR
ALBUMIN: 3.8 g/dL (ref 3.6–5.1)
ALK PHOS: 67 U/L (ref 33–130)
ALT: 12 U/L (ref 6–29)
AST: 16 U/L (ref 10–35)
BILIRUBIN TOTAL: 0.6 mg/dL (ref 0.2–1.2)
BUN: 18 mg/dL (ref 7–25)
CO2: 25 mmol/L (ref 20–31)
Calcium: 9 mg/dL (ref 8.6–10.4)
Chloride: 101 mmol/L (ref 98–110)
Creat: 1.2 mg/dL — ABNORMAL HIGH (ref 0.60–0.93)
GFR, Est African American: 50 mL/min — ABNORMAL LOW (ref >=60)
GFR, Est Non African American: 43 mL/min — ABNORMAL LOW (ref >=60)
GLUCOSE: 379 mg/dL — AB (ref 70–99)
POTASSIUM: 4.7 mmol/L (ref 3.5–5.3)
SODIUM: 137 mmol/L (ref 135–146)
TOTAL PROTEIN: 7.2 g/dL (ref 6.1–8.1)

## 2016-09-15 LAB — CBC WITH DIFFERENTIAL/PLATELET
BASOS PCT: 0 %
Basophils Absolute: 0 cells/uL (ref 0–200)
EOS PCT: 5 %
Eosinophils Absolute: 380 cells/uL (ref 15–500)
HCT: 40.9 % (ref 35.0–45.0)
HEMOGLOBIN: 13.6 g/dL (ref 12.0–15.0)
LYMPHS ABS: 1596 {cells}/uL (ref 850–3900)
Lymphocytes Relative: 21 %
MCH: 32.9 pg (ref 27.0–33.0)
MCHC: 33.3 g/dL (ref 32.0–36.0)
MCV: 98.8 fL (ref 80.0–100.0)
MPV: 10.6 fL (ref 7.5–12.5)
Monocytes Absolute: 760 cells/uL (ref 200–950)
Monocytes Relative: 10 %
NEUTROS ABS: 4864 {cells}/uL (ref 1500–7800)
Neutrophils Relative %: 64 %
Platelets: 261 10*3/uL (ref 140–400)
RBC: 4.14 MIL/uL (ref 3.80–5.10)
RDW: 12.5 % (ref 11.0–15.0)
WBC: 7.6 10*3/uL (ref 3.8–10.8)

## 2016-09-15 LAB — TSH: TSH: 1.29 mIU/L

## 2016-09-15 NOTE — Progress Notes (Signed)
Subjective:    Patient ID: Lindsay Nguyen, female    DOB: 04/05/1936, 81 y.o.   MRN: [id number]  HPI 03/2016 Patient is here today to establish care. Past medical history is significant for coronary artery disease. Patient has a history of PTCA in 2011 and ultimately underwent a three-vessel CABG. She also has a history of diastolic congestive heart failure and was admitted earlier this year with a BNP greater than 2000 and shortness of breath. She also has a history of atrial fibrillation for which she is currently on Eliquis. Today on her exam she is in atrial fibrillation although she is borderline bradycardic with a heart rate of 62 bpm. She is also taking Naprosyn on a daily basis for generalized aches and pains. I spent 10 minutes discussing the risk of taking anti-inflammatory medication with anticoagulants. I recommended switching to Tylenol.  Other history is also significant for chronic bronchitis/COPD for which she takes Pulmicort. She denies any wheezing at present. She also has a history of multinodular goiter. She is currently on levothyroxine. She sees an endocrinologist once year to check an ultrasound of her goiter to monitor the size of her nodules. She also has type 2 diabetes mellitus. She is currently on onglyza 2.5 mg a day and amaryl 2 mg pobid.  However she is not checking her blood sugars. She denies any hypoglycemic episodes. The last sugar I see was greater than 300 in March at the hospital. She denies any polyuria polydipsia or blurred vision. Past surgical history is significant for a thoracic ascending aortic aneurysm that was repaired at the same time as her CABG. She also complains of hearing loss in her right ear. She has a cerumen impaction in the right ear.  Patient has had Pneumovax 23 in the past. She believes that she's had Prevnar 13. She is due for a flu shot. She schedules her mammogram every year. Her last colonoscopy was approximately 5 years ago and was normal  [place] at her gastroenterologist told her that she did not need to have another. Due to her age she is not required to have it Pap smear.  At that time, my plan was: Her blood pressure today is well controlled. I'll make no changes in her blood pressure medication. I would like to check a fasting lipid panel. Her goal LDL cholesterol be less than 70 given her history of coronary artery disease. I would like to check hemoglobin A1c. Given her age, her ideal hemoglobin A1c would be less than 7. Given her history of coronary artery disease, I would consider switching the patient from onglyza to jardiance due to the reduction in mortality demonstrated in recent studies. I would also like to check a TSH to monitor the treatment of her hypothyroidism. Cerumen impaction was removed from her right ear with irrigation and lavage. Patient received her flu shot today.  09/15/16 Patient is here today coming by her daughter and her son.  Reportedly the patient was here for follow-up of her chronic medical problems including her diabetes and hyperlipidemia. They also complain of decreased hearing in the right ear. They also report 1 day history of low back pain. They also report persistent nausea and chronic lower abdominal pain for years that occurs on a daily basis has been evaluated in 2011 with a CAT scan. However they also discussed worsening memory problems. The patient's daughter states that this is been a gradual phenomenon. The example that they provide is that the patient got into  an argument with her granddaughter and then cannot remember the context of a argument 1 hour later. The son states that she will frequently load the dishwasher and wash it several times having forgotten that she is on a wash it once. She is also started to empty the dishwasher on occasion even though the dishes were dirty. The patient states that she's having a difficult time thinking of the names of objects. Mini-Mental status exam is  performed today. 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 Date and time, she scores 5 over 5.  She also scores 5 over 5 in location. On recall, the patient is able to recall 2 out of 3 objects. She does surprisingly well on serial sevens as well as spelling world in reverse. However on the clock drawing, the patient is unable to draw the hands of the clock appropriately. She also has a difficult time finding patterns and completing patterns. Therefore I do believe there are signs of early dementia. However is difficult at the present time to determine if this could be Alzheimer's dementia versus vascular dementia. She certainly is at extremely high risk for vascular dementia given her multiple medical problems.  Past Medical History:  Diagnosis Date  . Anemia   . Atrial fibrillation ([place])    persistent  . CAD (coronary artery disease)    2D ECHO, 11/04/2010 - EF >55%, mild-moderate mitral regurgitation, moderate tricuspid regurgitation  . Diabetes mellitus   . GERD (gastroesophageal reflux disease)   . Hyperlipidemia   . Hypertension   . Non-STEMI (non-ST elevated myocardial infarction) (HCC)    NUCLEAR STRESS TEST, 11/04/2010 - normal, no ischemia  . Osteopenia   . Pain in limb    LEA VENOUS DUPLEX, 06/27/2010 - no evidence of DVT  . Thoracic aortic aneurysm (HCC)    status post resection and grafting  . Thyroid disease    hypothyroid   Past Surgical History:  Procedure Laterality Date  . CARDIAC CATHETERIZATION  06/18/2010   Recommended CABG  . CARDIOVERSION N/A 09/30/2015   Procedure: CARDIOVERSION;  Surgeon: [name], MD;  Location: [facility] ENDOSCOPY;  Service: Cardiovascular;  Laterality: N/A;  . CARDIOVERSION N/A 06/05/2016   Procedure: CARDIOVERSION;  Surgeon: [name], MD;  Location: [facility] ENDOSCOPY;  Service: Cardiovascular;  Laterality: N/A;  . CHOLECYSTECTOMY    . CORONARY ARTERY BYPASS GRAFT  06/18/2010   LIMA-LAD, vein-diagonal branch, vein-obtuse marginal  branch, and resectioning and grafting of throacic aortic aneurysm  . ELBOW SURGERY  1979   fracture left  . TONSILLECTOMY  1945  . TUBAL LIGATION  1971   Current Outpatient Prescriptions on File Prior to Visit  Medication Sig Dispense Refill  . albuterol (PROVENTIL) (2.5 MG/3ML) 0.083% nebulizer solution INHALE 1 NEBULE VIA NEBULIZER Q 2 HOURS PRN FOR WHEEZING OR SHORTNESS OF BREATH  5  . amLODipine (NORVASC) 2.5 MG tablet Take 1 tablet (2.5 mg total) by mouth daily. 90 tablet 1  . aspirin EC 81 MG tablet Take 1 tablet (81 mg total) by mouth daily. 90 tablet 3  . atorvastatin (LIPITOR) 10 MG tablet Take 1 tablet (10 mg total) by mouth daily at 6 PM. 90 tablet 1  . budesonide (PULMICORT) 0.25 MG/2ML nebulizer solution Take 0.25 mg by nebulization 2 (two) times daily.    . carvedilol (COREG) 12.5 MG tablet TAKE 1&1/2 TABLETS BY MOUTH EVERY MORNING AND 1 TABLET EVERY EVENING 225 tablet 2  . ELIQUIS 5 MG TABS tablet TAKE 1 TABLET(5  MG) BY MOUTH TWICE DAILY 180 tablet 0  . escitalopram (LEXAPRO) 10 MG tablet Take 1 tablet (10 mg total) by mouth daily. 90 tablet 1  . furosemide (LASIX) 40 MG tablet TAKE 1 TABLET DAILY AS NEEDED FOR FLUID 90 tablet 1  . glimepiride (AMARYL) 2 MG tablet Take 1 tablet (2 mg total) by mouth 2 (two) times daily. 180 tablet 1  . levothyroxine (SYNTHROID, LEVOTHROID) 112 MCG tablet Take 0.5 tablets (56 mcg total) by mouth daily before breakfast. 1/2 tab daily, BRAND only per Dr palel 90 tablet 3  . nitroGLYCERIN (NITROSTAT) 0.4 MG SL tablet Place 0.4 mg under the tongue every 5 (five) minutes as needed for chest pain.     . saxagliptin HCl (ONGLYZA) 2.5 MG TABS tablet Take 1 tablet (2.5 mg total) by mouth daily. 90 tablet 1  . traZODone (DESYREL) 50 MG tablet Take 1 tablet (50 mg total) by mouth at bedtime as needed. 90 tablet 1  . valsartan (DIOVAN) 160 MG tablet Take 1 tablet (160 mg total) by mouth daily. 90 tablet 0   No current facility-administered medications on  file prior to visit.    Allergies  Allergen Reactions  . Iodine Anaphylaxis and Swelling  . Contrast Media [Iodinated Diagnostic Agents]   . Gabapentin Hives  . Shellfish Allergy    Social History   Social History  . Marital status: Married    Spouse name: N/A  . Number of children: N/A  . Years of education: N/A   Occupational History  . Not on file.   Social History Main Topics  . Smoking status: Never Smoker  . Smokeless tobacco: Never Used  . Alcohol use No  . Drug use: No  . Sexual activity: Not on file   Other Topics Concern  . Not on file   Social History Narrative  . No narrative on file   Family History  Problem Relation Age of Onset  . Hypertension Mother   . Osteoporosis Mother   . Heart disease Father   . Stroke Father   . Alzheimer's disease Father   . Heart attack Father      Review of Systems  All other systems reviewed and are negative.      Objective:   Physical Exam  Constitutional: She is oriented to person, place, and time. She appears well-developed and well-nourished. No distress.  HENT:  Head: Normocephalic and atraumatic.  Right Ear: External ear normal.  Left Ear: External ear normal.  Nose: Nose normal.  Mouth/Throat: Oropharynx is clear and moist. No oropharyngeal exudate.  Eyes: Conjunctivae and EOM are normal. Pupils are equal, round, and reactive to light. Right eye exhibits no discharge. Left eye exhibits no discharge. No scleral icterus.  Neck: Normal range of motion. Neck supple. No JVD present. No tracheal deviation present. No thyromegaly present.  Cardiovascular: Normal rate.  An irregularly irregular rhythm present.  Murmur heard. Pulmonary/Chest: Effort normal and breath sounds normal. No stridor. No respiratory distress. She has no wheezes. She has no rales. She exhibits no tenderness.  Abdominal: Soft. Bowel sounds are normal. She exhibits no distension and no mass. There is no tenderness. There is no rebound and  no guarding.  Musculoskeletal: She exhibits no edema, tenderness or deformity.  Lymphadenopathy:    She has no cervical adenopathy.  Neurological: She is alert and oriented to person, place, and time. She has normal reflexes. No cranial nerve deficit. She exhibits normal muscle tone. Coordination normal.  Skin: Skin is  warm. No rash noted. She is not diaphoretic. No erythema. No pallor.  Psychiatric: She has a normal mood and affect. Her behavior is normal. Judgment and thought content normal.  Vitals reviewed.         Assessment & Plan:  Memory loss - Plan: Vitamin B12, CBC with Differential/Platelet, COMPLETE METABOLIC PANEL WITH GFR, TSH  Controlled type 2 diabetes mellitus without complication, without long-term current use of insulin ([place]) - Plan: Hemoglobin A1c  There is apparent mild early memory loss. I will begin by obtaining lab work to check a CBC, CMP, TSH, and a vitamin B12. At the present time I'm unable to determine whether this is Alzheimer's disease versus vascular dementia. Therefore I will obtain an MRI of the brain without contrast. The patient cannot tolerate contrast as she has a history of an anaphylactic reaction. I will also focus on her risk factors but checking a hemoglobin A1c. I have asked the patient return fasting for fasting lipid panel. We did not have sufficient time today is more than 35 minutes were spent on our encounter to address her hearing loss, her abdominal pain, her back pain, and her persistent nausea. Therefore I will defer that to a future visit. I believe this takes precedence

## 2016-09-16 ENCOUNTER — Other Ambulatory Visit: Payer: Medicare Other

## 2016-09-16 ENCOUNTER — Other Ambulatory Visit: Payer: Self-pay | Admitting: Cardiovascular Disease

## 2016-09-16 DIAGNOSIS — E119 Type 2 diabetes mellitus without complications: Secondary | ICD-10-CM | POA: Diagnosis not present

## 2016-09-16 DIAGNOSIS — Z8673 Personal history of transient ischemic attack (TIA), and cerebral infarction without residual deficits: Secondary | ICD-10-CM | POA: Diagnosis not present

## 2016-09-16 DIAGNOSIS — R413 Other amnesia: Secondary | ICD-10-CM | POA: Diagnosis not present

## 2016-09-16 LAB — VITAMIN B12: VITAMIN B 12: 396 pg/mL (ref 200–1100)

## 2016-09-16 LAB — LIPID PANEL
CHOLESTEROL: 123 mg/dL (ref <200)
HDL: 28 mg/dL — ABNORMAL LOW (ref >50)
LDL CALC: 63 mg/dL (ref <100)
TRIGLYCERIDES: 162 mg/dL — AB (ref <150)
Total CHOL/HDL Ratio: 4.4 Ratio (ref <5.0)
VLDL: 32 mg/dL — AB (ref <30)

## 2016-09-16 LAB — HEMOGLOBIN A1C
Hgb A1c MFr Bld: 8 % — ABNORMAL HIGH (ref <5.7)
Mean Plasma Glucose: 183 mg/dL

## 2016-09-17 NOTE — Telephone Encounter (Signed)
Rx(s) sent to pharmacy electronically.  

## 2016-09-21 ENCOUNTER — Other Ambulatory Visit: Payer: Self-pay | Admitting: Family Medicine

## 2016-09-21 MED ORDER — EMPAGLIFLOZIN-LINAGLIPTIN 25-5 MG PO TABS: 1.0000 | ORAL_TABLET | Freq: Every day | ORAL | 1 refills | Status: DC

## 2016-09-22 ENCOUNTER — Telehealth: Payer: Self-pay | Admitting: Family Medicine

## 2016-09-22 NOTE — Telephone Encounter (Signed)
Without the MRI my opinion as this is likely Alzheimer's dementia. We could consider starting the patient on Aricept 5 mg a day to try to delay the progression of this so that she can stay independent as long as possible. With a like to try this even without MRI. There is no harm of the medication even if this is vascular dementia

## 2016-09-22 NOTE — Telephone Encounter (Signed)
Daughter states issue with insurance (??no PA needed, unless out of pocket co-pay) and has had to cancel MRI requested.  Says will try to reschedule sometime in March.

## 2016-09-23 ENCOUNTER — Telehealth: Payer: Self-pay | Admitting: Family Medicine

## 2016-09-23 MED ORDER — DONEPEZIL HCL 5 MG PO TABS: 5.0000 mg | ORAL_TABLET | Freq: Every day | ORAL | 5 refills | Status: DC

## 2016-09-23 NOTE — Telephone Encounter (Signed)
Received PA for the [facility] - spoke to dtr and it is going to be $1200+  - even with PA it is still probably gonna be very expensive - can we change medications? (she at the moment has no supplemental ins - see previous note)

## 2016-09-23 NOTE — Telephone Encounter (Signed)
Pt's daughter aware and med sent to pharm. She will discuss with her mother about the medication and let us know if she is willing to take it or not. She still wants to do the MRI she just forgot to pay the ins bill and dtr is trying to get it reinstated and will let us know when that happens and we can set up MRI at that time.

## 2016-09-23 NOTE — Telephone Encounter (Signed)
[place] with Lindsay Nguyen

## 2016-09-24 ENCOUNTER — Telehealth: Payer: Self-pay | Admitting: Family Medicine

## 2016-09-24 NOTE — Telephone Encounter (Signed)
Just wanted to know MRI was canceled for tomorrow

## 2016-09-24 NOTE — Telephone Encounter (Signed)
Patient is calling to speak to you regarding her scan that is scheduled for tomorrow, would like a call back asap

## 2016-09-24 NOTE — Telephone Encounter (Signed)
She was on onglyza correct?  She could resume onglyza and add farxiga or jardiance whichever insurance will cover

## 2016-09-25 ENCOUNTER — Other Ambulatory Visit: Payer: Medicare Other

## 2016-09-25 MED ORDER — EMPAGLIFLOZIN 25 MG PO TABS: 25.0000 mg | ORAL_TABLET | Freq: Every day | ORAL | 3 refills | Status: DC

## 2016-11-05 ENCOUNTER — Ambulatory Visit
Admission: RE | Admit: 2016-11-05 | Discharge: 2016-11-05 | Disposition: A | Discharge status: Home / Self Care | Payer: Medicare Other | Source: Ambulatory Visit | Attending: Family Medicine | Admitting: Family Medicine

## 2016-11-05 DIAGNOSIS — R413 Other amnesia: Secondary | ICD-10-CM

## 2016-11-05 DIAGNOSIS — R4182 Altered mental status, unspecified: Secondary | ICD-10-CM | POA: Diagnosis not present

## 2016-11-05 DIAGNOSIS — E119 Type 2 diabetes mellitus without complications: Secondary | ICD-10-CM

## 2016-11-05 DIAGNOSIS — Z8673 Personal history of transient ischemic attack (TIA), and cerebral infarction without residual deficits: Secondary | ICD-10-CM

## 2016-11-09 ENCOUNTER — Other Ambulatory Visit: Payer: Self-pay | Admitting: Family Medicine

## 2016-11-09 ENCOUNTER — Encounter: Payer: Self-pay | Admitting: Family Medicine

## 2016-11-09 ENCOUNTER — Ambulatory Visit (INDEPENDENT_AMBULATORY_CARE_PROVIDER_SITE_OTHER): Payer: Medicare Other | Admitting: Family Medicine

## 2016-11-09 VITALS — BP 104/60 | HR 76 | Temp 97.9°F | Resp 18 | Wt 210.0 lb

## 2016-11-09 DIAGNOSIS — R413 Other amnesia: Secondary | ICD-10-CM

## 2016-11-09 DIAGNOSIS — Z23 Encounter for immunization: Secondary | ICD-10-CM

## 2016-11-09 MED ORDER — DONEPEZIL HCL 10 MG PO TABS: 10.0000 mg | ORAL_TABLET | Freq: Every day | ORAL | 5 refills | Status: DC

## 2016-11-09 NOTE — Progress Notes (Signed)
Subjective:    Patient ID: Lindsay Nguyen, female    DOB: 04/14/1936, 81 y.o.   MRN: [id number]  HPI 03/2016 Patient is here today to establish care. Past medical history is significant for coronary artery disease. Patient has a history of PTCA in 2011 and ultimately underwent a three-vessel CABG. She also has a history of diastolic congestive heart failure and was admitted earlier this year with a BNP greater than 2000 and shortness of breath. She also has a history of atrial fibrillation for which she is currently on Eliquis. Today on her exam she is in atrial fibrillation although she is borderline bradycardic with a heart rate of 62 bpm. She is also taking Naprosyn on a daily basis for generalized aches and pains. I spent 10 minutes discussing the risk of taking anti-inflammatory medication with anticoagulants. I recommended switching to Tylenol.  Other history is also significant for chronic bronchitis/COPD for which she takes Pulmicort. She denies any wheezing at present. She also has a history of multinodular goiter. She is currently on levothyroxine. She sees an endocrinologist once year to check an ultrasound of her goiter to monitor the size of her nodules. She also has type 2 diabetes mellitus. She is currently on onglyza 2.5 mg a day and amaryl 2 mg pobid.  However she is not checking her blood sugars. She denies any hypoglycemic episodes. The last sugar I see was greater than 300 in March at the hospital. She denies any polyuria polydipsia or blurred vision. Past surgical history is significant for a thoracic ascending aortic aneurysm that was repaired at the same time as her CABG. She also complains of hearing loss in her right ear. She has a cerumen impaction in the right ear.  Patient has had Pneumovax 23 in the past. She believes that she's had Prevnar 13. She is due for a flu shot. She schedules her mammogram every year. Her last colonoscopy was approximately 5 years ago and was normal  [place] at her gastroenterologist told her that she did not need to have another. Due to her age she is not required to have it Pap smear.  At that time, my plan was: Her blood pressure today is well controlled. I'll make no changes in her blood pressure medication. I would like to check a fasting lipid panel. Her goal LDL cholesterol be less than 70 given her history of coronary artery disease. I would like to check hemoglobin A1c. Given her age, her ideal hemoglobin A1c would be less than 7. Given her history of coronary artery disease, I would consider switching the patient from onglyza to jardiance due to the reduction in mortality demonstrated in recent studies. I would also like to check a TSH to monitor the treatment of her hypothyroidism. Cerumen impaction was removed from her right ear with irrigation and lavage. Patient received her flu shot today.  09/15/16 Patient is here today accompanied by her daughter and her son.  Reportedly the patient was here for follow-up of her chronic medical problems including her diabetes and hyperlipidemia. They also complain of decreased hearing in the right ear. They also report 1 day history of low back pain. They also report persistent nausea and chronic lower abdominal pain for years that occurs on a daily basis has been evaluated in 2011 with a CAT scan. However they also discussed worsening memory problems. The patient's daughter states that this is been a gradual phenomenon. The example that they provide is that the patient got into  an argument with her granddaughter and then could not remember the context of a argument 1 hour later. The son states that she will frequently load the dishwasher and wash it several times having forgotten that she has already washed it once. She is also started to empty the dishwasher on occasion even though the dishes were dirty. The patient states that she's having a difficult time thinking of the names of objects. Mini-Mental status  exam is performed today. 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 Date and time, she scores 5 over 5.  She also scores 5 over 5 in location. On recall, the patient is able to recall 2 out of 3 objects. She does surprisingly well on serial sevens as well as spelling world in reverse. However on the clock drawing, the patient is unable to draw the hands of the clock appropriately. She also has a difficult time finding patterns and completing patterns. Therefore I do believe there are signs of early dementia. However it is difficult at the present time to determine if this could be Alzheimer's dementia versus vascular dementia. She certainly is at extremely high risk for vascular dementia given her multiple medical problems.  AT that time, my plan was: There is apparent mild early memory loss. I will begin by obtaining lab work to check a CBC, CMP, TSH, and a vitamin B12. At the present time I'm unable to determine whether this is Alzheimer's disease versus vascular dementia. Therefore I will obtain an MRI of the brain without contrast. The patient cannot tolerate contrast as she has a history of an anaphylactic reaction. I will also focus on her risk factors but checking a hemoglobin A1c. I have asked the patient return fasting for fasting lipid panel.   11/09/16 LDL was 63, HgA1c was 8.0.  I recommended switching onglyza to glyxambi but it was $1200.    MRI revealed: FINDINGS: Brain: No focal diffusion restriction to indicate acute infarct. No intraparenchymal hemorrhage. There is beginning confluent hyperintense T2-weighted signal within the periventricular and deep white matter, most often seen in the setting of chronic microvascular ischemia. No mass lesion or midline shift. No hydrocephalus or extra-axial fluid collection. The midline structures are normal. No age advanced or lobar predominant atrophy.  Vascular: Major intracranial arterial and venous sinus flow voids are preserved.  No evidence of chronic microhemorrhage or amyloid angiopathy.  Skull and upper cervical spine: The visualized skull base, calvarium, upper cervical spine and extracranial soft tissues are normal.  Sinuses/Orbits: There is an atelectatic left maxillary sinus with mild mucosal thickening. Normal orbits.     Past Medical History:  Diagnosis Date  . Anemia   . Atrial fibrillation ([place])    persistent  . CAD (coronary artery disease)    2D ECHO, 11/04/2010 - EF >55%, mild-moderate mitral regurgitation, moderate tricuspid regurgitation  . Diabetes mellitus   . GERD (gastroesophageal reflux disease)   . Hyperlipidemia   . Hypertension   . Non-STEMI (non-ST elevated myocardial infarction) (HCC)    NUCLEAR STRESS TEST, 11/04/2010 - normal, no ischemia  . Osteopenia   . Pain in limb    LEA VENOUS DUPLEX, 06/27/2010 - no evidence of DVT  . Thoracic aortic aneurysm (HCC)    status post resection and grafting  . Thyroid disease    hypothyroid   Past Surgical History:  Procedure Laterality Date  . CARDIAC CATHETERIZATION  06/18/2010   Recommended CABG  . CARDIOVERSION N/A 09/30/2015   Procedure: CARDIOVERSION;  Surgeon: [name], MD;  Location: [facility] ENDOSCOPY;  Service: Cardiovascular;  Laterality: N/A;  . CARDIOVERSION N/A 06/05/2016   Procedure: CARDIOVERSION;  Surgeon: [name], MD;  Location: [facility] ENDOSCOPY;  Service: Cardiovascular;  Laterality: N/A;  . CHOLECYSTECTOMY    . CORONARY ARTERY BYPASS GRAFT  06/18/2010   LIMA-LAD, vein-diagonal branch, vein-obtuse marginal branch, and resectioning and grafting of throacic aortic aneurysm  . ELBOW SURGERY  1979   fracture left  . TONSILLECTOMY  1945  . TUBAL LIGATION  1971   Current Outpatient Prescriptions on File Prior to Visit  Medication Sig Dispense Refill  . albuterol (PROVENTIL) (2.5 MG/3ML) 0.083% nebulizer solution INHALE 1 NEBULE VIA NEBULIZER Q 2 HOURS PRN FOR WHEEZING OR SHORTNESS OF BREATH  5  . amLODipine  (NORVASC) 2.5 MG tablet Take 1 tablet (2.5 mg total) by mouth daily. 90 tablet 1  . aspirin EC 81 MG tablet Take 1 tablet (81 mg total) by mouth daily. 90 tablet 3  . atorvastatin (LIPITOR) 10 MG tablet Take 1 tablet (10 mg total) by mouth daily at 6 PM. 90 tablet 1  . budesonide (PULMICORT) 0.25 MG/2ML nebulizer solution Take 0.25 mg by nebulization 2 (two) times daily.    . carvedilol (COREG) 12.5 MG tablet TAKE 1&1/2 TABLETS BY MOUTH EVERY MORNING AND 1 TABLET EVERY EVENING 225 tablet 2  . carvedilol (COREG) 12.5 MG tablet TAKE 1 AND 1/2 TABLETS EVERY MORNING AND 1 TABLET EVERY EVENING 225 tablet 2  . donepezil (ARICEPT) 5 MG tablet Take 1 tablet (5 mg total) by mouth at bedtime. 30 tablet 5  . ELIQUIS 5 MG TABS tablet TAKE 1 TABLET(5 MG) BY MOUTH TWICE DAILY 180 tablet 0  . empagliflozin (JARDIANCE) 25 MG TABS tablet Take 25 mg by mouth daily. 30 tablet 3  . escitalopram (LEXAPRO) 10 MG tablet Take 1 tablet (10 mg total) by mouth daily. 90 tablet 1  . furosemide (LASIX) 40 MG tablet TAKE 1 TABLET DAILY AS NEEDED FOR FLUID 90 tablet 1  . glimepiride (AMARYL) 2 MG tablet Take 1 tablet (2 mg total) by mouth 2 (two) times daily. 180 tablet 1  . levothyroxine (SYNTHROID, LEVOTHROID) 112 MCG tablet Take 0.5 tablets (56 mcg total) by mouth daily before breakfast. 1/2 tab daily, BRAND only per Dr palel 90 tablet 3  . loratadine (CLARITIN) 10 MG tablet Take 10 mg by mouth daily as needed for allergies.    . nitroGLYCERIN (NITROSTAT) 0.4 MG SL tablet Place 0.4 mg under the tongue every 5 (five) minutes as needed for chest pain.     . traZODone (DESYREL) 50 MG tablet Take 1 tablet (50 mg total) by mouth at bedtime as needed. 90 tablet 1   No current facility-administered medications on file prior to visit.    Allergies  Allergen Reactions  . Iodine Anaphylaxis and Swelling  . Contrast Media [Iodinated Diagnostic Agents]   . Gabapentin Hives  . Shellfish Allergy    Social History   Social History   . Marital status: Married    Spouse name: N/A  . Number of children: N/A  . Years of education: N/A   Occupational History  . Not on file.   Social History Main Topics  . Smoking status: Never Smoker  . Smokeless tobacco: Never Used  . Alcohol use No  . Drug use: No  . Sexual activity: Not on file   Other Topics Concern  . Not on file   Social History Narrative  . No  narrative on file   Family History  Problem Relation Age of Onset  . Hypertension Mother   . Osteoporosis Mother   . Heart disease Father   . Stroke Father   . Alzheimer's disease Father   . Heart attack Father      Review of Systems  All other systems reviewed and are negative.      Objective:   Physical Exam  Constitutional: She is oriented to person, place, and time. She appears well-developed and well-nourished. No distress.  HENT:  Head: Normocephalic and atraumatic.  Right Ear: External ear normal.  Left Ear: External ear normal.  Nose: Nose normal.  Mouth/Throat: Oropharynx is clear and moist. No oropharyngeal exudate.  Eyes: Conjunctivae and EOM are normal. Pupils are equal, round, and reactive to light. Right eye exhibits no discharge. Left eye exhibits no discharge. No scleral icterus.  Neck: Normal range of motion. Neck supple. No JVD present. No tracheal deviation present. No thyromegaly present.  Cardiovascular: Normal rate.  An irregularly irregular rhythm present.  Murmur heard. Pulmonary/Chest: Effort normal and breath sounds normal. No stridor. No respiratory distress. She has no wheezes. She has no rales. She exhibits no tenderness.  Abdominal: Soft. Bowel sounds are normal. She exhibits no distension and no mass. There is no tenderness. There is no rebound and no guarding.  Musculoskeletal: She exhibits no edema, tenderness or deformity.  Lymphadenopathy:    She has no cervical adenopathy.  Neurological: She is alert and oriented to person, place, and time. She has normal  reflexes. No cranial nerve deficit. She exhibits normal muscle tone. Coordination normal.  Skin: Skin is warm. No rash noted. She is not diaphoretic. No erythema. No pallor.  Psychiatric: She has a normal mood and affect. Her behavior is normal. Judgment and thought content normal.  Vitals reviewed.         Assessment & Plan:  Memory loss likely Alzheimer's Disease Diabetes Mellitus II Uncontrolled  Increase aricept 10 mg poqday.  Gradually add namemda if tolerated.  Daughter will discuss formulary options to see if there are any cheaper options.  If not try meformin XR 1000 mg poqam.

## 2016-11-09 NOTE — Telephone Encounter (Signed)
Medication refilled per protocol. 

## 2016-11-11 ENCOUNTER — Telehealth: Payer: Self-pay | Admitting: Family Medicine

## 2016-11-11 MED ORDER — GLIMEPIRIDE 2 MG PO TABS: 2.0000 mg | ORAL_TABLET | Freq: Two times a day (BID) | ORAL | 1 refills | Status: DC

## 2016-11-11 NOTE — Telephone Encounter (Signed)
Daughter called and states that the pt "probably" has not been taking the Amaryl as she contacted the pharmacy and she has not gotten it refilled since early last year. Pt is taking Onglyza and that is it for her DM (Med list updated). She would like for her to restart the Amaryl, as Santiago Glad will now be doing her pill box, along with the Onglyza and have her A1C rechecked in 3 months before starting her on a different medication right now.  Rx sent in for the Amaryl to [facility].

## 2016-11-12 NOTE — Telephone Encounter (Signed)
I agree

## 2016-11-17 ENCOUNTER — Encounter: Payer: Self-pay | Admitting: Cardiology

## 2016-11-17 ENCOUNTER — Ambulatory Visit (INDEPENDENT_AMBULATORY_CARE_PROVIDER_SITE_OTHER): Payer: Medicare Other | Admitting: Cardiology

## 2016-11-17 VITALS — BP 118/67 | HR 82 | Ht 69.0 in | Wt 210.2 lb

## 2016-11-17 DIAGNOSIS — I480 Paroxysmal atrial fibrillation: Secondary | ICD-10-CM | POA: Diagnosis not present

## 2016-11-17 DIAGNOSIS — Z7901 Long term (current) use of anticoagulants: Secondary | ICD-10-CM

## 2016-11-17 DIAGNOSIS — I5031 Acute diastolic (congestive) heart failure: Secondary | ICD-10-CM | POA: Diagnosis not present

## 2016-11-17 DIAGNOSIS — I739 Peripheral vascular disease, unspecified: Secondary | ICD-10-CM

## 2016-11-17 DIAGNOSIS — Z8659 Personal history of other mental and behavioral disorders: Secondary | ICD-10-CM | POA: Insufficient documentation

## 2016-11-17 DIAGNOSIS — Z951 Presence of aortocoronary bypass graft: Secondary | ICD-10-CM

## 2016-11-17 NOTE — Patient Instructions (Addendum)
Medication Instructions: no changes   Labwork: none   Testing/Procedures: none   Follow-Up: with Dr. Rayann Heman in about 2 weeks.   If you need a refill on your cardiac medications before your next appointment, please call your pharmacy.

## 2016-11-17 NOTE — Assessment & Plan Note (Signed)
DCCV 09/20/15, holding NSR

## 2016-11-17 NOTE — Assessment & Plan Note (Signed)
Hx of depression and mild cognitive deficit on Lexapro and Aricept

## 2016-11-17 NOTE — Assessment & Plan Note (Signed)
Hx of CABG x 3 with thoracic aneurysm repair 2011

## 2016-11-17 NOTE — Assessment & Plan Note (Signed)
History of diastolic CHF in 2979- echo April 2017- EF 55%, PA 39mmHg

## 2016-11-17 NOTE — Progress Notes (Signed)
11/17/2016 [place]   09/10/1935  [id number]  Primary Physician Odette Fraction, MD Primary Cardiologist: Dr Gwenlyn Found  HPI:  81 year old, female accompanied by her son and daughter. Her son was an Therapist, sports on unit 2000 at [employer] and her daughter is a NP in [place].   The pt had a NSTEMI March 21, 2010 and had an LAD BMS placed.  Because of a generous thoracic aorta on cath she was seen by Dr. Gilford Raid for evaluation. She was cath'd later in November 2011 revealing aggressive "in-stent restenosis" within the proximal LAD stent, as well as in her diagonal branch and AV groove circumflex. She ultimately underwent coronary artery bypass grafting Nov 2011 by Dr. Gilford Raid with LIMA to her LAD, SVG-Dx, and an SVG-OM with resection and grafting of her thoracic aortic aneurysm. She had a Myoview performed November 04, 2010, which was nonischemic. She developed symptomatic atrial fibrillation in Feb 2017. She had a DCCV 09/30/15 to NSR, sinus bradycardia.Marland Kitchen   She was admitted to the hospital in [facility] 10/15/15-10/17/15 with acute respiratory failure which apparently was a combination of COPD exacerbation and diastolic CHF. Her echo there showed pulmonary HTN but a f/u echo in April 2017 showed normal pulmonary pressures and normal LVF. A sleep study in April 2017 was negative for sleep apnea.   When she saw Dr Gwenlyn Found in Oct 2017 she was noted to be in AF and symptomatic. She underwent another DCCV 06/15/16 and was followed up in the AF clinic. Antiarrhythmics and further therapy was discussed.  . Dr Rayann Heman and he feelt best antiarrythmic, due to baseline conduction issues would be Tikosyn. However, she is on an antidepressant and this in combo with Tiksoyn would cause concern  for prolonged QTC. The pt/son stated then that it would be impossible for pt to stop antidepressant  She would not be an ablation candidate  for fear of causing severe AV Block and resulting in need for a pacemaker, which Dr.  Rayann Heman feels that this may be in her future at some point.   She is in the office today with her daughter. When I walked in the pt was smiling, she said she was doing good, no SOB, fatigue, or palpitations. Meanwhile the daughter is shaking her head "no". She says her mother is tired all the time- and he mother denies this. There is obviously some family dynamics at play here. She is back in AF-VR 65.     Current Outpatient Prescriptions  Medication Sig Dispense Refill  . albuterol (PROVENTIL) (2.5 MG/3ML) 0.083% nebulizer solution INHALE 1 NEBULE VIA NEBULIZER Q 2 HOURS PRN FOR WHEEZING OR SHORTNESS OF BREATH  5  . amLODipine (NORVASC) 2.5 MG tablet Take 1 tablet (2.5 mg total) by mouth daily. 90 tablet 1  . aspirin EC 81 MG tablet Take 1 tablet (81 mg total) by mouth daily. 90 tablet 3  . atorvastatin (LIPITOR) 10 MG tablet Take 1 tablet (10 mg total) by mouth daily at 6 PM. 90 tablet 1  . budesonide (PULMICORT) 0.25 MG/2ML nebulizer solution Take 0.25 mg by nebulization 2 (two) times daily.    . carvedilol (COREG) 12.5 MG tablet TAKE 1&1/2 TABLETS BY MOUTH EVERY MORNING AND 1 TABLET EVERY EVENING 225 tablet 2  . donepezil (ARICEPT) 10 MG tablet Take 1 tablet (10 mg total) by mouth at bedtime. 30 tablet 5  . ELIQUIS 5 MG TABS tablet TAKE 1 TABLET(5 MG) BY MOUTH TWICE DAILY 180 tablet 0  . escitalopram (  LEXAPRO) 10 MG tablet Take 1 tablet (10 mg total) by mouth daily. 90 tablet 1  . furosemide (LASIX) 40 MG tablet TAKE 1 TABLET DAILY AS NEEDED FOR FLUID 90 tablet 1  . glimepiride (AMARYL) 2 MG tablet Take 1 tablet (2 mg total) by mouth 2 (two) times daily. 180 tablet 1  . levothyroxine (SYNTHROID, LEVOTHROID) 112 MCG tablet Take 0.5 tablets (56 mcg total) by mouth daily before breakfast. 1/2 tab daily, BRAND only per Dr palel 90 tablet 3  . loratadine (CLARITIN) 10 MG tablet Take 10 mg by mouth daily as needed for allergies.    . nitroGLYCERIN (NITROSTAT) 0.4 MG SL tablet Place 0.4 mg under  the tongue every 5 (five) minutes as needed for chest pain.     . ONGLYZA 5 MG TABS tablet Take 1 tablet by mouth daily.  1  . traZODone (DESYREL) 50 MG tablet Take 1 tablet (50 mg total) by mouth at bedtime as needed. 90 tablet 1  . valsartan (DIOVAN) 160 MG tablet TAKE 1 TABLET(160 MG) BY MOUTH DAILY 90 tablet 1   No current facility-administered medications for this visit.     Allergies  Allergen Reactions  . Iodine Anaphylaxis and Swelling  . Contrast Media [Iodinated Diagnostic Agents]   . Gabapentin Hives  . Shellfish Allergy     Past Medical History:  Diagnosis Date  . Anemia   . Atrial fibrillation ([place])    persistent  . CAD (coronary artery disease)    2D ECHO, 11/04/2010 - EF >55%, mild-moderate mitral regurgitation, moderate tricuspid regurgitation  . Diabetes mellitus   . GERD (gastroesophageal reflux disease)   . Hyperlipidemia   . Hypertension   . Non-STEMI (non-ST elevated myocardial infarction) (HCC)    NUCLEAR STRESS TEST, 11/04/2010 - normal, no ischemia  . Osteopenia   . Pain in limb    LEA VENOUS DUPLEX, 06/27/2010 - no evidence of DVT  . Thoracic aortic aneurysm (HCC)    status post resection and grafting  . Thyroid disease    hypothyroid    Social History   Social History  . Marital status: Married    Spouse name: N/A  . Number of children: N/A  . Years of education: N/A   Occupational History  . Not on file.   Social History Main Topics  . Smoking status: Never Smoker  . Smokeless tobacco: Never Used  . Alcohol use No  . Drug use: No  . Sexual activity: Not on file   Other Topics Concern  . Not on file   Social History Narrative  . No narrative on file     Family History  Problem Relation Age of Onset  . Hypertension Mother   . Osteoporosis Mother   . Heart disease Father   . Stroke Father   . Alzheimer's disease Father   . Heart attack Father      Review of Systems: General: negative for chills, fever, night sweats or  weight changes.  Cardiovascular: negative for chest pain, dyspnea on exertion, edema, orthopnea, palpitations, paroxysmal nocturnal dyspnea or shortness of breath Dermatological: negative for rash Respiratory: negative for cough or wheezing Urologic: negative for hematuria Abdominal: negative for nausea, vomiting, diarrhea, bright red blood per rectum, melena, or hematemesis Neurologic: negative for visual changes, syncope, or dizziness All other systems reviewed and are otherwise negative except as noted above.    Blood pressure 118/67, pulse 82, height 5\' 9"  (1.753 m), weight 210 lb 3.2 oz (95.3 kg).  General  appearance: alert, cooperative, no distress and mildly obese Neck: no carotid bruit and no JVD Lungs: clear to auscultation bilaterally Heart: irregularly irregular rhythm Extremities: no edema Skin: Skin color, texture, turgor normal. No rashes or lesions Neurologic: Grossly normal  EKG AF with VR 65  ASSESSMENT AND PLAN:   PAF (paroxysmal atrial fibrillation) ([place]) DCCV 09/20/15, holding NSR  Hx of CABG Hx of CABG x 3 with thoracic aneurysm repair 7096  Acute diastolic (congestive) heart failure (HCC) History of diastolic CHF in 4383- echo April 2017- EF 55%, PA 55mmHg  Chronic anticoagulation Eliquis- CHA2Ds2VASc=7  History of depression Hx of depression and mild cognitive deficit on Lexapro and Aricept   PLAN  Pt is back in AF. I'm not sure if she is symptomatic or not, it depends on who you ask- the pt says no. I explained that there are many patients in chronic AF that have no symptoms or very limited symptoms. She apparently had few options for further AF Rx and the best course may be to leave her in AF with rate control. She and her family will discuss it. If they wish to pursue resumption on NSR they'll f/u in the AF clinic.   [name] PA-C 11/17/2016 2:20 PM

## 2016-11-17 NOTE — Assessment & Plan Note (Signed)
Eliquis- CHA2Ds2VASc=7

## 2016-11-24 ENCOUNTER — Other Ambulatory Visit: Payer: Self-pay | Admitting: Cardiovascular Disease

## 2016-11-24 ENCOUNTER — Other Ambulatory Visit: Payer: Self-pay | Admitting: Family Medicine

## 2016-11-30 ENCOUNTER — Institutional Professional Consult (permissible substitution): Payer: Medicare Other | Admitting: Internal Medicine

## 2017-02-02 ENCOUNTER — Ambulatory Visit (INDEPENDENT_AMBULATORY_CARE_PROVIDER_SITE_OTHER): Payer: Medicare Other | Admitting: Cardiovascular Disease

## 2017-02-02 ENCOUNTER — Encounter: Payer: Self-pay | Admitting: Cardiovascular Disease

## 2017-02-02 VITALS — BP 126/70 | HR 78 | Ht 69.0 in | Wt 211.6 lb

## 2017-02-02 DIAGNOSIS — E7800 Pure hypercholesterolemia, unspecified: Secondary | ICD-10-CM

## 2017-02-02 DIAGNOSIS — Z951 Presence of aortocoronary bypass graft: Secondary | ICD-10-CM

## 2017-02-02 DIAGNOSIS — I10 Essential (primary) hypertension: Secondary | ICD-10-CM

## 2017-02-02 DIAGNOSIS — I480 Paroxysmal atrial fibrillation: Secondary | ICD-10-CM | POA: Diagnosis not present

## 2017-02-02 NOTE — Progress Notes (Signed)
02/02/2017 [name]   05-27-1936  [id number]  Primary Physician Lindsay Schaumann Cammie Mcgee, MD Primary Cardiologist: Lindsay Harp MD Lindsay Nguyen  HPI:  Lindsay Nguyen is a pleasant, 81 year old, mildly overweight, married Caucasian female, mother of 2, grandmother to 2 grandchildren accompanied by her  daughter..I last saw her in the office 07/08/16. Her risk factors include family history, hypertension, hyperlipidemia and noninsulin-requiring diabetes. She had a non-ST-segment-elevation myocardial infarction March 21, 2010, with peak troponin of 4. I brought her to the cath lab the following day and stented her proximal LAD with a bare-metal stent. She did have nonobstructive disease beyond the stent as well as a diagonal branch, AV groove circumflex with an EF of 45% and anteroapical akinesia. Because of a generous thoracic aorta on cath, I performed a CT angiogram on her revealing her thoracic aorta which measured 4.9 cm. She was seen by Dr. Gilford Nguyen for evaluation of this at my request. Myoview performed June 11, 2010, showed apical and inferior ischemia as well as anterior ischemia. I noted her June 17, 2010, with rapid onset dyspnea relieved with sublingual nitroglycerin. The patient ruled out for myocardial infarction and was heparinized. I ultimately cath'd her November 16 revealing aggressive "in-stent restenosis" within the proximal LAD stent, as well as in her diagonal branch and AV groove circumflex.   She ultimately underwent coronary artery bypass grafting by Dr. Gilford Nguyen with LIMA to her LAD, a vein to a diagonal branch, as well as obtuse marginal branch with resection and grafting of her thoracic aortic aneurysm using a 20-mm supracoronary Hemashield 2 graft utilizing deep hypothermic circulatory arrest with resuspension of her native aortic valve and reimplantation of her native coronary arteries. Her postop course was complicated and prolonged, lasting 4 weeks.  She did have atrial fibrillation, pneumonia, cholecystitis status post cholecystectomy, as well as pleural effusions. She had a percutaneous drain of her gallbladder and ultimately cholecystectomy by Dr. Excell Nguyen September 18, 2010. She recuperated nicely. She had a Myoview performed November 04, 2010, which was nonischemic and a 2D echo that showed normal LV systolic function with a normal functioning aortic valve.  When I saw her last she was in atrial fibrillation which was confirmed on 30 day event monitoring with tachybradycardia syndrome. She was placed on oral anticoagulation and ultimately underwent successful outpatient cardioversion by Dr. Dorris Nguyen 09/30/15. She remains in sinus rhythm/sinus bradycardia. She was hospitalized in [facility] 10/15/15 for 2 days with upper respiratory tract infection. She did have diastolic heart failure that time with a BNP of greater than 2000 and was diuresed. A 2-D echo did show moderate to severe pulmonary hypertension without PA systolic pressure of 09-32 mmHg and septal flattening. Apparently a pulmonary embolus was ruled out at that time. A subsequent 2-D echo performed 11/25/15 showed normal pulmonary artery pressures and normal LV function. She did have an outpatient sleep study that did not show sleep apnea. She's had increasing fatigue and dyspnea over the last several weeks. Because she has been symptomatic in [name] I'll arrange for her to undergo outpatient DC cardioversion. This occurred successfully performed by Dr. Sallyanne Nguyen on 06/05/16 and she was seen by Lindsay Nguyen in the [name] clinic on 06/16/16.   She saw Lindsay Nguyen in the office 11/17/16 which time she was back in atrial fibrillation. She denies chest pain or shortness of breath. She lives a fairly sedentary lifestyle however.   Current Outpatient Prescriptions  Medication Sig  Dispense Refill  . albuterol (PROVENTIL) (2.5 MG/3ML) 0.083% nebulizer solution INHALE 1 NEBULE VIA NEBULIZER  Q 2 HOURS PRN FOR WHEEZING OR SHORTNESS OF BREATH  5  . amLODipine (NORVASC) 2.5 MG tablet TAKE 1 TABLET(2.5 MG) BY MOUTH DAILY 90 tablet 0  . aspirin EC 81 MG tablet Take 1 tablet (81 mg total) by mouth daily. 90 tablet 3  . atorvastatin (LIPITOR) 10 MG tablet TAKE 1 TABLET(10 MG) BY MOUTH DAILY AT 6 PM 90 tablet 0  . budesonide (PULMICORT) 0.25 MG/2ML nebulizer solution Take 0.25 mg by nebulization as directed.     . carvedilol (COREG) 12.5 MG tablet TAKE 1&1/2 TABLETS BY MOUTH EVERY MORNING AND 1 TABLET EVERY EVENING 225 tablet 2  . donepezil (ARICEPT) 10 MG tablet Take 1 tablet (10 mg total) by mouth at bedtime. 30 tablet 5  . ELIQUIS 5 MG TABS tablet TAKE 1 TABLET(5 MG) BY MOUTH TWICE DAILY 180 tablet 1  . escitalopram (LEXAPRO) 10 MG tablet TAKE 1 TABLET(10 MG) BY MOUTH DAILY 90 tablet 0  . furosemide (LASIX) 40 MG tablet Take 40 mg by mouth daily as needed for fluid.    Marland Kitchen glimepiride (AMARYL) 2 MG tablet Take 1 tablet (2 mg total) by mouth 2 (two) times daily. 180 tablet 1  . levothyroxine (SYNTHROID, LEVOTHROID) 112 MCG tablet Take 0.5 tablets (56 mcg total) by mouth daily before breakfast. 1/2 tab daily, BRAND only per Dr palel 90 tablet 3  . loratadine (CLARITIN) 10 MG tablet Take 10 mg by mouth daily as needed for allergies.    . nitroGLYCERIN (NITROSTAT) 0.4 MG SL tablet Place 0.4 mg under the tongue every 5 (five) minutes as needed for chest pain (MAX 3 TABLETS).     . ONGLYZA 5 MG TABS tablet Take 1 tablet by mouth daily.  1  . valsartan (DIOVAN) 160 MG tablet TAKE 1 TABLET(160 MG) BY MOUTH DAILY 90 tablet 1   No current facility-administered medications for this visit.     Allergies  Allergen Reactions  . Contrast Media [Iodinated Diagnostic Agents] Anaphylaxis and Swelling  . Iodine Anaphylaxis and Swelling  . Gabapentin Hives  . Shellfish Allergy     unknown    Social History   Social History  . Marital status: Married    Spouse name: N/A  . Number of children: N/A    . Years of education: N/A   Occupational History  . Not on file.   Social History Main Topics  . Smoking status: Never Smoker  . Smokeless tobacco: Never Used  . Alcohol use No  . Drug use: No  . Sexual activity: Not on file   Other Topics Concern  . Not on file   Social History Narrative  . No narrative on file     Review of Systems: General: negative for chills, fever, night sweats or weight changes.  Cardiovascular: negative for chest pain, dyspnea on exertion, edema, orthopnea, palpitations, paroxysmal nocturnal dyspnea or shortness of breath Dermatological: negative for rash Respiratory: negative for cough or wheezing Urologic: negative for hematuria Abdominal: negative for nausea, vomiting, diarrhea, bright red blood per rectum, melena, or hematemesis Neurologic: negative for visual changes, syncope, or dizziness All other systems reviewed and are otherwise negative except as noted above.    Blood pressure 126/70, pulse 78, height 5\' 9"  (1.753 m), weight 211 lb 9.6 oz (96 kg).  General appearance: alert and no distress Neck: no adenopathy, no carotid bruit, no JVD, supple, symmetrical, trachea midline and  thyroid not enlarged, symmetric, no tenderness/mass/nodules Lungs: clear to auscultation bilaterally Heart: irregularly irregular rhythm Extremities: extremities normal, atraumatic, no cyanosis or edema  EKG atrial fibrillation with ventricular response of 78, left anterior fascicular block, LVH with QRS widening and poor R-wave progression. I personally reviewed this EKG.  ASSESSMENT AND PLAN:   HLD (hyperlipidemia) History of hyperlipidemia on statin therapy with recent lipid profile performed 09/16/16 revealed total cholesterol 123, LDL 63 and HDL of 28.  Essential hypertension History of essential hypertension blood pressure measured at 126/70. She is on amlodipine, carvedilol and valsartan. Continue current meds at current dosing  Hx of CABG History of  CAD status post non-ST segment elevation microinfarction 03/21/10 with stenting of proximal LAD with a bare metal stent. She did have a generous thoracic aorta measuring 4.9 cm. Assessment Myoview performed 06/11/10 showed apical and inferior ischemia as well as anterior ischemia. She underwent repeat cardiac catheter catheterization revealing aggressive in-stent restenosis and also she underwent coronary artery bypass grafting by Dr. Cyndia Bent with a LIMA to LAD, vein to diagonal branch, obtuse marginal branch as well as thoracic aortic aneurysm resection and grafting using a supra-coronary and a shield to graft with resuspension of her native aortic valve and reimplantation of her coronary arteries. She denies chest pain.  PAF (paroxysmal atrial fibrillation) (HCC) History of chronic [name] at this point rate controlled on Eliquis  oral anticoagulation. She did not undergo cardioversion 06/15/16 successfully relatively reverted back to [name].      Lindsay Harp MD FACP,FACC,FAHA, [facility] 02/02/2017 3:37 PM

## 2017-02-02 NOTE — Assessment & Plan Note (Signed)
History of essential hypertension blood pressure measured at 126/70. She is on amlodipine, carvedilol and valsartan. Continue current meds at current dosing

## 2017-02-02 NOTE — Assessment & Plan Note (Signed)
History of chronic [name] at this point rate controlled on Eliquis  oral anticoagulation. She did not undergo cardioversion 06/15/16 successfully relatively reverted back to [name].

## 2017-02-02 NOTE — Patient Instructions (Signed)
Medication Instructions: Your physician recommends that you continue on your current medications as directed. Please refer to the Current Medication list given to you today.   Follow-Up: We request that you follow-up in: 6 months with Luke Kilroy, PA-C and in 12 months with Dr Berry.  You will receive a reminder letter in the mail two months in advance. If you don't receive a letter, please call our office to schedule the follow-up appointment.  If you need a refill on your cardiac medications before your next appointment, please call your pharmacy.  

## 2017-02-02 NOTE — Assessment & Plan Note (Signed)
History of CAD status post non-ST segment elevation microinfarction 03/21/10 with stenting of proximal LAD with a bare metal stent. She did have a generous thoracic aorta measuring 4.9 cm. Assessment Myoview performed 06/11/10 showed apical and inferior ischemia as well as anterior ischemia. She underwent repeat cardiac catheter catheterization revealing aggressive in-stent restenosis and also she underwent coronary artery bypass grafting by Dr. Cyndia Bent with a LIMA to LAD, vein to diagonal branch, obtuse marginal branch as well as thoracic aortic aneurysm resection and grafting using a supra-coronary and a shield to graft with resuspension of her native aortic valve and reimplantation of her coronary arteries. She denies chest pain.

## 2017-02-02 NOTE — Assessment & Plan Note (Signed)
History of hyperlipidemia on statin therapy with recent lipid profile performed 09/16/16 revealed total cholesterol 123, LDL 63 and HDL of 28.

## 2017-02-07 ENCOUNTER — Other Ambulatory Visit: Payer: Self-pay | Admitting: Family Medicine

## 2017-02-26 ENCOUNTER — Telehealth: Payer: Self-pay | Admitting: Cardiology

## 2017-02-26 NOTE — Telephone Encounter (Signed)
Daughter called and wanted Lindsay Nguyen to know that pt did not want the appointment with Dr Rayann Heman on 03-03-17. She does want any other appointments at this time. Daughter wanted you to know she appreciated everything.

## 2017-03-01 ENCOUNTER — Other Ambulatory Visit: Payer: Self-pay | Admitting: Family Medicine

## 2017-03-01 ENCOUNTER — Telehealth: Payer: Self-pay | Admitting: Family Medicine

## 2017-03-01 MED ORDER — LOSARTAN POTASSIUM 50 MG PO TABS: 50.0000 mg | ORAL_TABLET | Freq: Every day | ORAL | 3 refills | Status: DC

## 2017-03-01 MED ORDER — ONGLYZA 5 MG PO TABS: 5.0000 mg | ORAL_TABLET | Freq: Every day | ORAL | 1 refills | Status: DC

## 2017-03-01 NOTE — Telephone Encounter (Signed)
Valsartan has been recalled need it changed and needs refill on Onglyza.  Per [facility] change Valsartan to losartan 50 mg qd.  Med sent to pharm.

## 2017-03-03 ENCOUNTER — Institutional Professional Consult (permissible substitution): Payer: Medicare Other | Admitting: Internal Medicine

## 2017-04-26 ENCOUNTER — Other Ambulatory Visit: Payer: Self-pay | Admitting: Family Medicine

## 2017-05-10 ENCOUNTER — Other Ambulatory Visit: Payer: Self-pay | Admitting: Family Medicine

## 2017-05-13 ENCOUNTER — Other Ambulatory Visit: Payer: Self-pay | Admitting: Family Medicine

## 2017-05-27 ENCOUNTER — Other Ambulatory Visit: Payer: Self-pay | Admitting: Family Medicine

## 2017-06-09 ENCOUNTER — Other Ambulatory Visit: Payer: Self-pay | Admitting: *Deleted

## 2017-06-09 MED ORDER — CARVEDILOL 12.5 MG PO TABS: ORAL_TABLET | ORAL | 3 refills | Status: DC

## 2017-07-22 DIAGNOSIS — Z23 Encounter for immunization: Secondary | ICD-10-CM | POA: Diagnosis not present

## 2017-08-05 ENCOUNTER — Ambulatory Visit (INDEPENDENT_AMBULATORY_CARE_PROVIDER_SITE_OTHER): Payer: Medicare Other | Admitting: Family Medicine

## 2017-08-05 ENCOUNTER — Encounter: Payer: Self-pay | Admitting: Family Medicine

## 2017-08-05 VITALS — BP 140/80 | HR 88 | Temp 98.1°F | Resp 20 | Ht 69.0 in | Wt 209.0 lb

## 2017-08-05 DIAGNOSIS — E1165 Type 2 diabetes mellitus with hyperglycemia: Secondary | ICD-10-CM

## 2017-08-05 DIAGNOSIS — IMO0001 Reserved for inherently not codable concepts without codable children: Secondary | ICD-10-CM

## 2017-08-05 DIAGNOSIS — I480 Paroxysmal atrial fibrillation: Secondary | ICD-10-CM | POA: Diagnosis not present

## 2017-08-05 DIAGNOSIS — R112 Nausea with vomiting, unspecified: Secondary | ICD-10-CM

## 2017-08-05 DIAGNOSIS — R413 Other amnesia: Secondary | ICD-10-CM | POA: Diagnosis not present

## 2017-08-05 DIAGNOSIS — Z951 Presence of aortocoronary bypass graft: Secondary | ICD-10-CM | POA: Diagnosis not present

## 2017-08-05 DIAGNOSIS — Z8673 Personal history of transient ischemic attack (TIA), and cerebral infarction without residual deficits: Secondary | ICD-10-CM

## 2017-08-05 NOTE — Progress Notes (Signed)
Subjective:    Patient ID: Lindsay Nguyen, female    DOB: 1936/07/03, 82 y.o.   MRN: [id number]  HPI 03/2016 Patient is here today to establish care. Past medical history is significant for coronary artery disease. Patient has a history of PTCA in 2011 and ultimately underwent a three-vessel CABG. She also has a history of diastolic congestive heart failure and was admitted earlier this year with a BNP greater than 2000 and shortness of breath. She also has a history of atrial fibrillation for which she is currently on Eliquis. Today on her exam she is in atrial fibrillation although she is borderline bradycardic with a heart rate of 62 bpm. She is also taking Naprosyn on a daily basis for generalized aches and pains. I spent 10 minutes discussing the risk of taking anti-inflammatory medication with anticoagulants. I recommended switching to Tylenol.  Other history is also significant for chronic bronchitis/COPD for which she takes Pulmicort. She denies any wheezing at present. She also has a history of multinodular goiter. She is currently on levothyroxine. She sees an endocrinologist once year to check an ultrasound of her goiter to monitor the size of her nodules. She also has type 2 diabetes mellitus. She is currently on onglyza 2.5 mg a day and amaryl 2 mg pobid.  However she is not checking her blood sugars. She denies any hypoglycemic episodes. The last sugar I see was greater than 300 in March at the hospital. She denies any polyuria polydipsia or blurred vision. Past surgical history is significant for a thoracic ascending aortic aneurysm that was repaired at the same time as her CABG. She also complains of hearing loss in her right ear. She has a cerumen impaction in the right ear.  Patient has had Pneumovax 23 in the past. She believes that she's had Prevnar 13. She is due for a flu shot. She schedules her mammogram every year. Her last colonoscopy was approximately 5 years ago and was normal  [place] at her gastroenterologist told her that she did not need to have another. Due to her age she is not required to have it Pap smear.  At that time, my plan was: Her blood pressure today is well controlled. I'll make no changes in her blood pressure medication. I would like to check a fasting lipid panel. Her goal LDL cholesterol be less than 70 given her history of coronary artery disease. I would like to check hemoglobin A1c. Given her age, her ideal hemoglobin A1c would be less than 7. Given her history of coronary artery disease, I would consider switching the patient from onglyza to jardiance due to the reduction in mortality demonstrated in recent studies. I would also like to check a TSH to monitor the treatment of her hypothyroidism. Cerumen impaction was removed from her right ear with irrigation and lavage. Patient received her flu shot today.  09/15/16 Patient is here today accompanied by her daughter and her son.  Reportedly the patient was here for follow-up of her chronic medical problems including her diabetes and hyperlipidemia. They also complain of decreased hearing in the right ear. They also report 1 day history of low back pain. They also report persistent nausea and chronic lower abdominal pain for years that occurs on a daily basis has been evaluated in 2011 with a CAT scan. However they also discussed worsening memory problems. The patient's daughter states that this is been a gradual phenomenon. The example that they provide is that the patient got into  an argument with her granddaughter and then could not remember the context of a argument 1 hour later. The son states that she will frequently load the dishwasher and wash it several times having forgotten that she has already washed it once. She is also started to empty the dishwasher on occasion even though the dishes were dirty. The patient states that she's having a difficult time thinking of the names of objects. Mini-Mental status  exam is performed today. 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 Date and time, she scores 5 over 5.  She also scores 5 over 5 in location. On recall, the patient is able to recall 2 out of 3 objects. She does surprisingly well on serial sevens as well as spelling world in reverse. However on the clock drawing, the patient is unable to draw the hands of the clock appropriately. She also has a difficult time finding patterns and completing patterns. Therefore I do believe there are signs of early dementia. However it is difficult at the present time to determine if this could be Alzheimer's dementia versus vascular dementia. She certainly is at extremely high risk for vascular dementia given her multiple medical problems.  AT that time, my plan was: There is apparent mild early memory loss. I will begin by obtaining lab work to check a CBC, CMP, TSH, and a vitamin B12. At the present time I'm unable to determine whether this is Alzheimer's disease versus vascular dementia. Therefore I will obtain an MRI of the brain without contrast. The patient cannot tolerate contrast as she has a history of an anaphylactic reaction. I will also focus on her risk factors but checking a hemoglobin A1c. I have asked the patient return fasting for fasting lipid panel.   11/09/16 LDL was 63, HgA1c was 8.0.  I recommended switching onglyza to glyxambi but it was $1200.    MRI revealed: FINDINGS: Brain: No focal diffusion restriction to indicate acute infarct. No intraparenchymal hemorrhage. There is beginning confluent hyperintense T2-weighted signal within the periventricular and deep white matter, most often seen in the setting of chronic microvascular ischemia. No mass lesion or midline shift. No hydrocephalus or extra-axial fluid collection. The midline structures are normal. No age advanced or lobar predominant atrophy.  Vascular: Major intracranial arterial and venous sinus flow voids are preserved.  No evidence of chronic microhemorrhage or amyloid angiopathy.  Skull and upper cervical spine: The visualized skull base, calvarium, upper cervical spine and extracranial soft tissues are normal.  Sinuses/Orbits: There is an atelectatic left maxillary sinus with mild mucosal thickening. Normal orbits. At that time, my plan was:  Increase aricept 10 mg poqday.  Gradually add namemda if tolerated.  Daughter will discuss formulary options to see if there are any cheaper options.  If not try meformin XR 1000 mg poqam.    08/05/17 Ultimately, daughter felt that the patient had not been taking her Amaryl regularly. Therefore in April of last year, the daughter wanted to have the patient take Onglyza and Amaryl on a regular basis and then recheck her lab work in 3 months. I have not seen the patient since. Her weight is remarkably stable and essentially unchanged from her visit last April. However the patient is not checking her sugars. She does endorse polyuria. She also endorses polydipsia. She denies any blurry vision. She is here today with her daughter and her son. Apparently she has been suffering from nausea for the last 2 months. She gets nauseated quickly  and easily no matter what she eats. She denies any bilious vomiting. She denies any hematemesis or coffee ground emesis. She denies any melena. She denies any hematochezia. She denies any fevers or chills. She does report nausea the last 2 days that subsequently led to vomiting after a meal. She denies any kidney pain. She denies any dysuria. She denies any constipation. She denies any change in her bowel habits. Nausea occurs immediately after eating raising the concern for gastroparesis. On examination, she does have very hypoactive bowel sounds. Otherwise the abdomen is soft nondistended and nontender. Son and daughter state that the patient's dementia is worsening. She tends to stay up all night unable to sleep. She then sleeps all day. She has  no specific bedtime. She has no specific time that she wakes up in the morning. There is no set regular schedule. She tends to sundown at night. She lives at home with her son.    Past Medical History:  Diagnosis Date  . Anemia   . Atrial fibrillation ([place])    persistent  . CAD (coronary artery disease)    2D ECHO, 11/04/2010 - EF >55%, mild-moderate mitral regurgitation, moderate tricuspid regurgitation  . Diabetes mellitus   . GERD (gastroesophageal reflux disease)   . Hyperlipidemia   . Hypertension   . Non-STEMI (non-ST elevated myocardial infarction) ([facility]CC)    NUCLEAR STRESS TEST, 11/04/2010 - normal, no ischemia  . Osteopenia   . Pain in limb    LEA VENOUS DUPLEX, 06/27/2010 - no evidence of DVT  . Thoracic aortic aneurysm ([facility])    status post resection and grafting  . Thyroid disease    hypothyroid   Past Surgical History:  Procedure Laterality Date  . CARDIAC CATHETERIZATION  06/18/2010   Recommended CABG  . CARDIOVERSION N/A 09/30/2015   Procedure: CARDIOVERSION;  Surgeon: [name], MD;  Location: [facility] ENDOSCOPY;  Service: Cardiovascular;  Laterality: N/A;  . CARDIOVERSION N/A 06/05/2016   Procedure: CARDIOVERSION;  Surgeon: [name], MD;  Location: [facility] ENDOSCOPY;  Service: Cardiovascular;  Laterality: N/A;  . CHOLECYSTECTOMY    . CORONARY ARTERY BYPASS GRAFT  06/18/2010   LIMA-LAD, vein-diagonal branch, vein-obtuse marginal branch, and resectioning and grafting of throacic aortic aneurysm  . ELBOW SURGERY  1979   fracture left  . TONSILLECTOMY  1945  . TUBAL LIGATION  1971   Current Outpatient Medications on File Prior to Visit  Medication Sig Dispense Refill  . albuterol (PROVENTIL) (2.5 MG/3ML) 0.083% nebulizer solution INHALE 1 NEBULE VIA NEBULIZER Q 2 HOURS PRN FOR WHEEZING OR SHORTNESS OF BREATH  5  . amLODipine (NORVASC) 2.5 MG tablet TAKE 1 TABLET(2.5 MG) BY MOUTH DAILY 90 tablet 1  . aspirin EC 81 MG tablet Take 1 tablet (81 mg total) by mouth daily. 90  tablet 3  . atorvastatin (LIPITOR) 10 MG tablet TAKE 1 TABLET(10 MG) BY MOUTH DAILY AT 6 PM 90 tablet 0  . budesonide (PULMICORT) 0.25 MG/2ML nebulizer solution Take 0.25 mg by nebulization as directed.     . carvedilol (COREG) 12.5 MG tablet TAKE 1&1/2 TABLETS BY MOUTH EVERY MORNING AND 1 TABLET EVERY EVENING 225 tablet 3  . donepezil (ARICEPT) 10 MG tablet TAKE 1 TABLET(10 MG) BY MOUTH AT BEDTIME 90 tablet 0  . ELIQUIS 5 MG TABS tablet TAKE 1 TABLET(5 MG) BY MOUTH TWICE DAILY 180 tablet 1  . escitalopram (LEXAPRO) 10 MG tablet TAKE 1 TABLET(10 MG) BY MOUTH DAILY 90 tablet 0  . furosemide (LASIX) 40 MG  tablet TAKE 1 TABLET BY MOUTH DAILY AS NEEDED FOR FLUID RETENTION 90 tablet 1  . glimepiride (AMARYL) 2 MG tablet TAKE 1 TABLET(2 MG) BY MOUTH TWICE DAILY 180 tablet 0  . loratadine (CLARITIN) 10 MG tablet Take 10 mg by mouth daily as needed for allergies.    Marland Kitchen losartan (COZAAR) 50 MG tablet Take 1 tablet (50 mg total) by mouth daily. 90 tablet 3  . nitroGLYCERIN (NITROSTAT) 0.4 MG SL tablet Place 0.4 mg under the tongue every 5 (five) minutes as needed for chest pain (MAX 3 TABLETS).     . ONGLYZA 5 MG TABS tablet Take 1 tablet (5 mg total) by mouth daily. 90 tablet 1  . SYNTHROID 112 MCG tablet TAKE 1/2 TABLET BY MOUTH DAILY BEFORE BREAKFAST 90 tablet 1   No current facility-administered medications on file prior to visit.    Allergies  Allergen Reactions  . Contrast Media [Iodinated Diagnostic Agents] Anaphylaxis and Swelling  . Iodine Anaphylaxis and Swelling  . Gabapentin Hives  . Shellfish Allergy     unknown   Social History   Socioeconomic History  . Marital status: Married    Spouse name: Not on file  . Number of children: Not on file  . Years of education: Not on file  . Highest education level: Not on file  Social Needs  . Financial resource strain: Not on file  . Food insecurity - worry: Not on file  . Food insecurity - inability: Not on file  . Transportation needs -  medical: Not on file  . Transportation needs - non-medical: Not on file  Occupational History  . Not on file  Tobacco Use  . Smoking status: Never Smoker  . Smokeless tobacco: Never Used  Substance and Sexual Activity  . Alcohol use: No  . Drug use: No  . Sexual activity: Not on file  Other Topics Concern  . Not on file  Social History Narrative  . Not on file   Family History  Problem Relation Age of Onset  . Hypertension Mother   . Osteoporosis Mother   . Heart disease Father   . Stroke Father   . Alzheimer's disease Father   . Heart attack Father      Review of Systems  Musculoskeletal: Positive for back pain.  All other systems reviewed and are negative.      Objective:   Physical Exam  Constitutional: She is oriented to person, place, and time. She appears well-developed and well-nourished. No distress.  HENT:  Head: Normocephalic and atraumatic.  Right Ear: External ear normal.  Left Ear: External ear normal.  Nose: Nose normal.  Mouth/Throat: Oropharynx is clear and moist. No oropharyngeal exudate.  Eyes: Conjunctivae and EOM are normal. Pupils are equal, round, and reactive to light. Right eye exhibits no discharge. Left eye exhibits no discharge. No scleral icterus.  Neck: Normal range of motion. Neck supple. No JVD present. No tracheal deviation present. No thyromegaly present.  Cardiovascular: Normal rate. An irregularly irregular rhythm present.  Murmur heard. Pulmonary/Chest: Effort normal and breath sounds normal. No stridor. No respiratory distress. She has no wheezes. She has no rales. She exhibits no tenderness.  Abdominal: Soft. Bowel sounds are normal. She exhibits no distension and no mass. There is no tenderness. There is no rebound and no guarding.  Musculoskeletal: She exhibits no edema, tenderness or deformity.  Lymphadenopathy:    She has no cervical adenopathy.  Neurological: She is alert and oriented to person, place, and  time. She has  normal reflexes. No cranial nerve deficit. She exhibits normal muscle tone. Coordination normal.  Skin: Skin is warm. No rash noted. She is not diaphoretic. No erythema. No pallor.  Psychiatric: She has a normal mood and affect. Her behavior is normal. Judgment and thought content normal.  Vitals reviewed.         Assessment & Plan:  Uncontrolled diabetes mellitus type 2 without complications ([place]) - Plan: Hemoglobin A1c, COMPLETE METABOLIC PANEL WITH GFR, CBC with Differential/Platelet, Lipid panel, Microalbumin, urine  History of TIA (transient ischemic attack)  Hx of CABG  PAF (paroxysmal atrial fibrillation) (HCC)  Memory loss  Non-intractable vomiting with nausea, unspecified vomiting type  I suspect the patient's nausea and vomiting is likely due to gastroparesis stemming from her uncontrolled type 2 diabetes mellitus. I would like to begin a workup by obtaining lab work including a CBC, CMP, fasting lipid panel, hemoglobin A1c. I want to obtain a CMP to evaluate for any evidence of acidosis, kidney failure, uncontrolled diabetes with hemoglobin A1c greater than 10, or leukocytosis with a CBC. If lab work is relatively normal aside from her blood sugars, I will try the patient empirically on Reglan 5 mg to 10 mg every before meals to see if this helps with nausea and vomiting. If no benefit is quickly seen with this medication, I would then proceed with imaging of the abdomen and pelvis.  I am reassured by her relatively normal review of systems.  Her weight is stable and her blood pressures well controlled. She does not appear to be dehydrated. There is no evidence of an acute abdomen or toxic patient. Therefore I will await the results of lab work for proceeding further workup or treatment suggestions. I also encouraged the family to institute a regular schedule to help combat her sundowning. They need to avoid allowing the patient to sleep late and take naps during the day. Hopefully  this would facilitate her ability to sleep at night. I also believe by keeping a regular schedule, the patient will avoid confusion is much as possible and it may help lessen her sundowning.

## 2017-08-06 ENCOUNTER — Other Ambulatory Visit: Payer: Self-pay | Admitting: Cardiovascular Disease

## 2017-08-06 LAB — COMPLETE METABOLIC PANEL WITH GFR
AG RATIO: 1.1 (calc) (ref 1.0–2.5)
ALKALINE PHOSPHATASE (APISO): 66 U/L (ref 33–130)
ALT: 11 U/L (ref 6–29)
AST: 16 U/L (ref 10–35)
Albumin: 3.7 g/dL (ref 3.6–5.1)
BILIRUBIN TOTAL: 0.8 mg/dL (ref 0.2–1.2)
BUN/Creatinine Ratio: 17 (calc) (ref 6–22)
BUN: 18 mg/dL (ref 7–25)
CHLORIDE: 98 mmol/L (ref 98–110)
CO2: 28 mmol/L (ref 20–32)
Calcium: 9.1 mg/dL (ref 8.6–10.4)
Creat: 1.05 mg/dL — ABNORMAL HIGH (ref 0.60–0.93)
GFR, EST AFRICAN AMERICAN: 58 mL/min/{1.73_m2} — AB (ref >=60)
GFR, Est Non African American: 50 mL/min/{1.73_m2} — ABNORMAL LOW (ref >=60)
Globulin: 3.5 g/dL (calc) (ref 1.9–3.7)
Glucose, Bld: 243 mg/dL — ABNORMAL HIGH (ref 65–99)
POTASSIUM: 4 mmol/L (ref 3.5–5.3)
Sodium: 137 mmol/L (ref 135–146)
TOTAL PROTEIN: 7.2 g/dL (ref 6.1–8.1)

## 2017-08-06 LAB — CBC WITH DIFFERENTIAL/PLATELET
BASOS ABS: 32 {cells}/uL (ref 0–200)
Basophils Relative: 0.4 %
EOS PCT: 2.6 %
Eosinophils Absolute: 208 cells/uL (ref 15–500)
HCT: 39.4 % (ref 35.0–45.0)
Hemoglobin: 13.4 g/dL (ref 11.7–15.5)
Lymphs Abs: 1440 cells/uL (ref 850–3900)
MCH: 32.4 pg (ref 27.0–33.0)
MCHC: 34 g/dL (ref 32.0–36.0)
MCV: 95.2 fL (ref 80.0–100.0)
MONOS PCT: 11.2 %
MPV: 10.4 fL (ref 7.5–12.5)
NEUTROS PCT: 67.8 %
Neutro Abs: 5424 cells/uL (ref 1500–7800)
Platelets: 288 10*3/uL (ref 140–400)
RBC: 4.14 10*6/uL (ref 3.80–5.10)
RDW: 12 % (ref 11.0–15.0)
Total Lymphocyte: 18 %
WBC mixed population: 896 cells/uL (ref 200–950)
WBC: 8 10*3/uL (ref 3.8–10.8)

## 2017-08-06 LAB — MICROALBUMIN, URINE: Microalb, Ur: >600 mg/dL

## 2017-08-06 LAB — LIPID PANEL
CHOLESTEROL: 164 mg/dL (ref <200)
HDL: 28 mg/dL — AB (ref >50)
LDL Cholesterol (Calc): 103 mg/dL (calc) — ABNORMAL HIGH
Non-HDL Cholesterol (Calc): 136 mg/dL (calc) — ABNORMAL HIGH (ref <130)
Total CHOL/HDL Ratio: 5.9 (calc) — ABNORMAL HIGH (ref <5.0)
Triglycerides: 214 mg/dL — ABNORMAL HIGH (ref <150)

## 2017-08-06 LAB — HEMOGLOBIN A1C
HEMOGLOBIN A1C: 7.6 %{Hb} — AB (ref <5.7)
MEAN PLASMA GLUCOSE: 171 (calc)
eAG (mmol/L): 9.5 (calc)

## 2017-08-09 ENCOUNTER — Other Ambulatory Visit: Payer: Self-pay | Admitting: Family Medicine

## 2017-08-09 ENCOUNTER — Encounter: Payer: Self-pay | Admitting: Family Medicine

## 2017-08-20 ENCOUNTER — Ambulatory Visit (INDEPENDENT_AMBULATORY_CARE_PROVIDER_SITE_OTHER): Payer: Medicare Other | Admitting: Family Medicine

## 2017-08-20 ENCOUNTER — Encounter: Payer: Self-pay | Admitting: Family Medicine

## 2017-08-20 VITALS — BP 130/70 | HR 80 | Temp 98.2°F | Resp 20 | Ht 69.0 in | Wt 211.0 lb

## 2017-08-20 DIAGNOSIS — R1030 Lower abdominal pain, unspecified: Secondary | ICD-10-CM | POA: Diagnosis not present

## 2017-08-20 DIAGNOSIS — G43A0 Cyclical vomiting, not intractable: Secondary | ICD-10-CM

## 2017-08-20 DIAGNOSIS — R1115 Cyclical vomiting syndrome unrelated to migraine: Secondary | ICD-10-CM

## 2017-08-20 DIAGNOSIS — R112 Nausea with vomiting, unspecified: Secondary | ICD-10-CM | POA: Diagnosis not present

## 2017-08-20 NOTE — Progress Notes (Signed)
Subjective:    Patient ID: Lindsay Nguyen, female    DOB: 1935/11/25, 82 y.o.   MRN: [id number]  Medication Refill   Back Pain    03/2016 Patient is here today to establish care. Past medical history is significant for coronary artery disease. Patient has a history of PTCA in 2011 and ultimately underwent a three-vessel CABG. She also has a history of diastolic congestive heart failure and was admitted earlier this year with a BNP greater than 2000 and shortness of breath. She also has a history of atrial fibrillation for which she is currently on Eliquis. Today on her exam she is in atrial fibrillation although she is borderline bradycardic with a heart rate of 62 bpm. She is also taking Naprosyn on a daily basis for generalized aches and pains. I spent 10 minutes discussing the risk of taking anti-inflammatory medication with anticoagulants. I recommended switching to Tylenol.  Other history is also significant for chronic bronchitis/COPD for which she takes Pulmicort. She denies any wheezing at present. She also has a history of multinodular goiter. She is currently on levothyroxine. She sees an endocrinologist once year to check an ultrasound of her goiter to monitor the size of her nodules. She also has type 2 diabetes mellitus. She is currently on onglyza 2.5 mg a day and amaryl 2 mg pobid.  However she is not checking her blood sugars. She denies any hypoglycemic episodes. The last sugar I see was greater than 300 in March at the hospital. She denies any polyuria polydipsia or blurred vision. Past surgical history is significant for a thoracic ascending aortic aneurysm that was repaired at the same time as her CABG. She also complains of hearing loss in her right ear. She has a cerumen impaction in the right ear.  Patient has had Pneumovax 23 in the past. She believes that she's had Prevnar 13. She is due for a flu shot. She schedules her mammogram every year. Her last colonoscopy was  approximately 5 years ago and was normal [place] at her gastroenterologist told her that she did not need to have another. Due to her age she is not required to have it Pap smear.  At that time, my plan was: Her blood pressure today is well controlled. I'll make no changes in her blood pressure medication. I would like to check a fasting lipid panel. Her goal LDL cholesterol be less than 70 given her history of coronary artery disease. I would like to check hemoglobin A1c. Given her age, her ideal hemoglobin A1c would be less than 7. Given her history of coronary artery disease, I would consider switching the patient from onglyza to jardiance due to the reduction in mortality demonstrated in recent studies. I would also like to check a TSH to monitor the treatment of her hypothyroidism. Cerumen impaction was removed from her right ear with irrigation and lavage. Patient received her flu shot today.  09/15/16 Patient is here today accompanied by her daughter and her son.  Reportedly the patient was here for follow-up of her chronic medical problems including her diabetes and hyperlipidemia. They also complain of decreased hearing in the right ear. They also report 1 day history of low back pain. They also report persistent nausea and chronic lower abdominal pain for years that occurs on a daily basis has been evaluated in 2011 with a CAT scan. However they also discussed worsening memory problems. The patient's daughter states that this is been a gradual phenomenon. The example that  they provide is that the patient got into an argument with her granddaughter and then could not remember the context of a argument 1 hour later. The son states that she will frequently load the dishwasher and wash it several times having forgotten that she has already washed it once. She is also started to empty the dishwasher on occasion even though the dishes were dirty. The patient states that she's having a difficult time thinking of  the names of objects. Mini-Mental status exam is performed today. 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 Date and time, she scores 5 over 5.  She also scores 5 over 5 in location. On recall, the patient is able to recall 2 out of 3 objects. She does surprisingly well on serial sevens as well as spelling world in reverse. However on the clock drawing, the patient is unable to draw the hands of the clock appropriately. She also has a difficult time finding patterns and completing patterns. Therefore I do believe there are signs of early dementia. However it is difficult at the present time to determine if this could be Alzheimer's dementia versus vascular dementia. She certainly is at extremely high risk for vascular dementia given her multiple medical problems.  AT that time, my plan was: There is apparent mild early memory loss. I will begin by obtaining lab work to check a CBC, CMP, TSH, and a vitamin B12. At the present time I'm unable to determine whether this is Alzheimer's disease versus vascular dementia. Therefore I will obtain an MRI of the brain without contrast. The patient cannot tolerate contrast as she has a history of an anaphylactic reaction. I will also focus on her risk factors but checking a hemoglobin A1c. I have asked the patient return fasting for fasting lipid panel.   11/09/16 LDL was 63, HgA1c was 8.0.  I recommended switching onglyza to glyxambi but it was $1200.    MRI revealed: FINDINGS: Brain: No focal diffusion restriction to indicate acute infarct. No intraparenchymal hemorrhage. There is beginning confluent hyperintense T2-weighted signal within the periventricular and deep white matter, most often seen in the setting of chronic microvascular ischemia. No mass lesion or midline shift. No hydrocephalus or extra-axial fluid collection. The midline structures are normal. No age advanced or lobar predominant atrophy.  Vascular: Major intracranial arterial  and venous sinus flow voids are preserved. No evidence of chronic microhemorrhage or amyloid angiopathy.  Skull and upper cervical spine: The visualized skull base, calvarium, upper cervical spine and extracranial soft tissues are normal.  Sinuses/Orbits: There is an atelectatic left maxillary sinus with mild mucosal thickening. Normal orbits. At that time, my plan was:  Increase aricept 10 mg poqday.  Gradually add namemda if tolerated.  Daughter will discuss formulary options to see if there are any cheaper options.  If not try meformin XR 1000 mg poqam.    08/05/17 Ultimately, daughter felt that the patient had not been taking her Amaryl regularly. Therefore in April of last year, the daughter wanted to have the patient take Onglyza and Amaryl on a regular basis and then recheck her lab work in 3 months. I have not seen the patient since. Her weight is remarkably stable and essentially unchanged from her visit last April. However the patient is not checking her sugars. She does endorse polyuria. She also endorses polydipsia. She denies any blurry vision. She is here today with her daughter and her son. Apparently she has been suffering from nausea for  the last 2 months. She gets nauseated quickly and easily no matter what she eats. She denies any bilious vomiting. She denies any hematemesis or coffee ground emesis. She denies any melena. She denies any hematochezia. She denies any fevers or chills. She does report nausea the last 2 days that subsequently led to vomiting after a meal. She denies any kidney pain. She denies any dysuria. She denies any constipation. She denies any change in her bowel habits. Nausea occurs immediately after eating raising the concern for gastroparesis. On examination, she does have very hypoactive bowel sounds. Otherwise the abdomen is soft nondistended and nontender. Son and daughter state that the patient's dementia is worsening. She tends to stay up all night unable  to sleep. She then sleeps all day. She has no specific bedtime. She has no specific time that she wakes up in the morning. There is no set regular schedule. She tends to sundown at night. She lives at home with her son.  At that time, my plan was: I suspect the patient's nausea and vomiting is likely due to gastroparesis stemming from her uncontrolled type 2 diabetes mellitus. I would like to begin a workup by obtaining lab work including a CBC, CMP, fasting lipid panel, hemoglobin A1c. I want to obtain a CMP to evaluate for any evidence of acidosis, kidney failure, uncontrolled diabetes with hemoglobin A1c greater than 10, or leukocytosis with a CBC. If lab work is relatively normal aside from her blood sugars, I will try the patient empirically on Reglan 5 mg to 10 mg every before meals to see if this helps with nausea and vomiting. If no benefit is quickly seen with this medication, I would then proceed with imaging of the abdomen and pelvis.  I am reassured by her relatively normal review of systems.  Her weight is stable and her blood pressures well controlled. She does not appear to be dehydrated. There is no evidence of an acute abdomen or toxic patient. Therefore I will await the results of lab work for proceeding further workup or treatment suggestions. I also encouraged the family to institute a regular schedule to help combat her sundowning. They need to avoid allowing the patient to sleep late and take naps during the day. Hopefully this would facilitate her ability to sleep at night. I also believe by keeping a regular schedule, the patient will avoid confusion is much as possible and it may help lessen her sundowning.  08/20/17 Office Visit on 08/05/2017  Component Date Value Ref Range Status  . Hgb A1c MFr Bld 08/05/2017 7.6* <5.7 % of total Hgb Final   Comment: For someone without known diabetes, a hemoglobin A1c value of 6.5% or greater indicates that they may have  diabetes and this  should be confirmed with a follow-up  test. . For someone with known diabetes, a value <7% indicates  that their diabetes is well controlled and a value  greater than or equal to 7% indicates suboptimal  control. A1c targets should be individualized based on  duration of diabetes, age, comorbid conditions, and  other considerations. . Currently, no consensus exists regarding use of hemoglobin A1c for diagnosis of diabetes for children. .   . Mean Plasma Glucose 08/05/2017 171  (calc) Final  . eAG (mmol/L) 08/05/2017 9.5  (calc) Final  . Glucose, Bld 08/05/2017 243* 65 - 99 mg/dL Final   Comment: .            Fasting reference interval . For someone without  known diabetes, a glucose value >125 mg/dL indicates that they may have diabetes and this should be confirmed with a follow-up test. .   . BUN 08/05/2017 18  7 - 25 mg/dL Final  . Creat 08/05/2017 1.05* 0.60 - 0.93 mg/dL Final   Comment: For patients >72 years of age, the reference limit for Creatinine is approximately 13% higher for people identified as African-American. .   . GFR, Est Non African American 08/05/2017 50* > OR = 60 mL/min/1.77m2 Final  . GFR, Est African American 08/05/2017 58* > OR = 60 mL/min/1.32m2 Final  . BUN/Creatinine Ratio 08/05/2017 17  6 - 22 (calc) Final  . Sodium 08/05/2017 137  135 - 146 mmol/L Final  . Potassium 08/05/2017 4.0  3.5 - 5.3 mmol/L Final  . Chloride 08/05/2017 98  98 - 110 mmol/L Final  . CO2 08/05/2017 28  20 - 32 mmol/L Final  . Calcium 08/05/2017 9.1  8.6 - 10.4 mg/dL Final  . Total Protein 08/05/2017 7.2  6.1 - 8.1 g/dL Final  . Albumin 08/05/2017 3.7  3.6 - 5.1 g/dL Final  . Globulin 08/05/2017 3.5  1.9 - 3.7 g/dL (calc) Final  . AG Ratio 08/05/2017 1.1  1.0 - 2.5 (calc) Final  . Total Bilirubin 08/05/2017 0.8  0.2 - 1.2 mg/dL Final  . Alkaline phosphatase (APISO) 08/05/2017 66  33 - 130 U/L Final  . AST 08/05/2017 16  10 - 35 U/L Final  . ALT 08/05/2017 11  6 - 29 U/L  Final  . WBC 08/05/2017 8.0  3.8 - 10.8 Thousand/uL Final  . RBC 08/05/2017 4.14  3.80 - 5.10 Million/uL Final  . Hemoglobin 08/05/2017 13.4  11.7 - 15.5 g/dL Final  . HCT 08/05/2017 39.4  35.0 - 45.0 % Final  . MCV 08/05/2017 95.2  80.0 - 100.0 fL Final  . MCH 08/05/2017 32.4  27.0 - 33.0 pg Final  . MCHC 08/05/2017 34.0  32.0 - 36.0 g/dL Final  . RDW 08/05/2017 12.0  11.0 - 15.0 % Final  . Platelets 08/05/2017 288  140 - 400 Thousand/uL Final  . MPV 08/05/2017 10.4  7.5 - 12.5 fL Final  . Neutro Abs 08/05/2017 5,424  1,500 - 7,800 cells/uL Final  . Lymphs Abs 08/05/2017 1,440  850 - 3,900 cells/uL Final  . WBC mixed population 08/05/2017 896  200 - 950 cells/uL Final  . Eosinophils Absolute 08/05/2017 208  15 - 500 cells/uL Final  . Basophils Absolute 08/05/2017 32  0 - 200 cells/uL Final  . Neutrophils Relative % 08/05/2017 67.8  % Final  . Total Lymphocyte 08/05/2017 18.0  % Final  . Monocytes Relative 08/05/2017 11.2  % Final  . Eosinophils Relative 08/05/2017 2.6  % Final  . Basophils Relative 08/05/2017 0.4  % Final  . Cholesterol 08/05/2017 164  <200 mg/dL Final  . HDL 08/05/2017 28* >50 mg/dL Final  . Triglycerides 08/05/2017 214* <150 mg/dL Final  . LDL Cholesterol (Calc) 08/05/2017 103* mg/dL (calc) Final   Comment: Reference range: <100 . Desirable range <100 mg/dL for primary prevention;   <70 mg/dL for patients with CHD or diabetic patients  with > or = 2 CHD risk factors. Marland Kitchen LDL-C is now calculated using the Martin-Hopkins  calculation, which is a validated novel method providing  better accuracy than the Friedewald equation in the  estimation of LDL-C.  Cresenciano Genre et al. Annamaria Helling. 4098;119(14): 2061-2068  (http://education.[facility].com/faq/FAQ164)   . Total CHOL/HDL Ratio 08/05/2017 5.9* <5.0 (calc) Final  .  Non-HDL Cholesterol (Calc) 08/05/2017 136* <130 mg/dL (calc) Final   Comment: For patients with diabetes plus 1 major ASCVD risk  factor, treating to a  non-HDL-C goal of <100 mg/dL  (LDL-C of <70 mg/dL) is considered a therapeutic  option.   Jacquelyne Balint, Ur 08/05/2017 >600.0  mg/dL Final   Comment: Verified by repeat analysis. Marland Kitchen Reference Range Not established   . RAM 08/05/2017    Final   Comment: . The ADA defines abnormalities in albumin excretion as follows: Marland Kitchen Category         Result (mcg/mg creatinine) . Normal                    <30 Microalbuminuria         30-299  Clinical albuminuria   > OR = 300 . The ADA recommends that at least two of three specimens collected within a 3-6 month period be abnormal before considering a patient to be within a diagnostic category.    Labs were unremarkable aside from HgA1c of 7.6 and mild CKD stage 3.  Reglan 5 mg potid was recommended. Family reports improvement in the patient's nausea although she continues to experience nausea and occasional vomiting. However abdominal pain continues to worsen. She's had chronic abdominal pain for several years. Over the last several months however the abdominal pain in her left lower quadrant and right lower quadrant have become constant. She is taking Tylenol regularly to manage the pain. She denies any constipation. She denies any diarrhea. There is been no witnessed blood in the stool. However she continues to have daily moderate sharp abdominal pain with no exacerbating or alleviating factors.     Past Medical History:  Diagnosis Date  . Anemia   . Atrial fibrillation ([place])    persistent  . CAD (coronary artery disease)    2D ECHO, 11/04/2010 - EF >55%, mild-moderate mitral regurgitation, moderate tricuspid regurgitation  . Diabetes mellitus   . GERD (gastroesophageal reflux disease)   . Hyperlipidemia   . Hypertension   . Non-STEMI (non-ST elevated myocardial infarction) ([facility])    NUCLEAR STRESS TEST, 11/04/2010 - normal, no ischemia  . Osteopenia   . Pain in limb    LEA VENOUS DUPLEX, 06/27/2010 - no evidence of DVT  . Thoracic aortic  aneurysm ([facility])    status post resection and grafting  . Thyroid disease    hypothyroid   Past Surgical History:  Procedure Laterality Date  . CARDIAC CATHETERIZATION  06/18/2010   Recommended CABG  . CARDIOVERSION N/A 09/30/2015   Procedure: CARDIOVERSION;  Surgeon: [name], MD;  Location: [facility] ENDOSCOPY;  Service: Cardiovascular;  Laterality: N/A;  . CARDIOVERSION N/A 06/05/2016   Procedure: CARDIOVERSION;  Surgeon: [name], MD;  Location: [facility] ENDOSCOPY;  Service: Cardiovascular;  Laterality: N/A;  . CHOLECYSTECTOMY    . CORONARY ARTERY BYPASS GRAFT  06/18/2010   LIMA-LAD, vein-diagonal branch, vein-obtuse marginal branch, and resectioning and grafting of throacic aortic aneurysm  . ELBOW SURGERY  1979   fracture left  . TONSILLECTOMY  1945  . TUBAL LIGATION  1971   Current Outpatient Medications on File Prior to Visit  Medication Sig Dispense Refill  . albuterol (PROVENTIL) (2.5 MG/3ML) 0.083% nebulizer solution INHALE 1 NEBULE VIA NEBULIZER Q 2 HOURS PRN FOR WHEEZING OR SHORTNESS OF BREATH  5  . amLODipine (NORVASC) 2.5 MG tablet TAKE 1 TABLET(2.5 MG) BY MOUTH DAILY 90 tablet 1  . aspirin EC 81 MG tablet Take 1 tablet (81  mg total) by mouth daily. 90 tablet 3  . atorvastatin (LIPITOR) 10 MG tablet TAKE 1 TABLET(10 MG) BY MOUTH DAILY AT 6 PM 90 tablet 0  . budesonide (PULMICORT) 0.25 MG/2ML nebulizer solution Take 0.25 mg by nebulization as directed.     . carvedilol (COREG) 12.5 MG tablet TAKE 1&1/2 TABLETS BY MOUTH EVERY MORNING AND 1 TABLET EVERY EVENING 225 tablet 3  . donepezil (ARICEPT) 10 MG tablet TAKE 1 TABLET(10 MG) BY MOUTH AT BEDTIME 90 tablet 3  . ELIQUIS 5 MG TABS tablet TAKE 1 TABLET(5 MG) BY MOUTH TWICE DAILY 180 tablet 0  . escitalopram (LEXAPRO) 10 MG tablet TAKE 1 TABLET(10 MG) BY MOUTH DAILY 90 tablet 0  . furosemide (LASIX) 40 MG tablet TAKE 1 TABLET BY MOUTH DAILY AS NEEDED FOR FLUID RETENTION 90 tablet 1  . glimepiride (AMARYL) 2 MG tablet TAKE 1  TABLET(2 MG) BY MOUTH TWICE DAILY 180 tablet 1  . loratadine (CLARITIN) 10 MG tablet Take 10 mg by mouth daily as needed for allergies.    Marland Kitchen losartan (COZAAR) 50 MG tablet Take 1 tablet (50 mg total) by mouth daily. 90 tablet 3  . metoCLOPramide (REGLAN) 5 MG tablet Take 1 tablet (5 mg total) by mouth 4 (four) times daily -  before meals and at bedtime.    . nitroGLYCERIN (NITROSTAT) 0.4 MG SL tablet Place 0.4 mg under the tongue every 5 (five) minutes as needed for chest pain (MAX 3 TABLETS).     . ONGLYZA 5 MG TABS tablet Take 1 tablet (5 mg total) by mouth daily. 90 tablet 1  . SYNTHROID 112 MCG tablet TAKE 1/2 TABLET BY MOUTH DAILY BEFORE BREAKFAST 90 tablet 1   No current facility-administered medications on file prior to visit.    Allergies  Allergen Reactions  . Contrast Media [Iodinated Diagnostic Agents] Anaphylaxis and Swelling  . Iodine Anaphylaxis and Swelling  . Gabapentin Hives  . Shellfish Allergy     unknown   Social History   Socioeconomic History  . Marital status: Married    Spouse name: Not on file  . Number of children: Not on file  . Years of education: Not on file  . Highest education level: Not on file  Social Needs  . Financial resource strain: Not on file  . Food insecurity - worry: Not on file  . Food insecurity - inability: Not on file  . Transportation needs - medical: Not on file  . Transportation needs - non-medical: Not on file  Occupational History  . Not on file  Tobacco Use  . Smoking status: Never Smoker  . Smokeless tobacco: Never Used  Substance and Sexual Activity  . Alcohol use: No  . Drug use: No  . Sexual activity: Not on file  Other Topics Concern  . Not on file  Social History Narrative  . Not on file   Family History  Problem Relation Age of Onset  . Hypertension Mother   . Osteoporosis Mother   . Heart disease Father   . Stroke Father   . Alzheimer's disease Father   . Heart attack Father      Review of Systems    Musculoskeletal: Positive for back pain.  All other systems reviewed and are negative.      Objective:   Physical Exam  Constitutional: She is oriented to person, place, and time. She appears well-developed and well-nourished. No distress.  HENT:  Head: Normocephalic and atraumatic.  Right Ear: External ear normal.  Left Ear: External ear normal.  Nose: Nose normal.  Mouth/Throat: Oropharynx is clear and moist. No oropharyngeal exudate.  Eyes: Conjunctivae and EOM are normal. Pupils are equal, round, and reactive to light. Right eye exhibits no discharge. Left eye exhibits no discharge. No scleral icterus.  Neck: Normal range of motion. Neck supple. No JVD present. No tracheal deviation present. No thyromegaly present.  Cardiovascular: Normal rate. An irregularly irregular rhythm present.  Murmur heard. Pulmonary/Chest: Effort normal and breath sounds normal. No stridor. No respiratory distress. She has no wheezes. She has no rales. She exhibits no tenderness.  Abdominal: Soft. Bowel sounds are normal. She exhibits no distension and no mass. There is no tenderness. There is no rebound and no guarding.  Musculoskeletal: She exhibits no edema, tenderness or deformity.  Lymphadenopathy:    She has no cervical adenopathy.  Neurological: She is alert and oriented to person, place, and time. She has normal reflexes. No cranial nerve deficit. She exhibits normal muscle tone. Coordination normal.  Skin: Skin is warm. No rash noted. She is not diaphoretic. No erythema. No pallor.  Psychiatric: She has a normal mood and affect. Her behavior is normal. Judgment and thought content normal.  Vitals reviewed.    Wt Readings from Last 3 Encounters:  08/20/17 211 lb (95.7 kg)  08/05/17 209 lb (94.8 kg)  02/02/17 211 lb 9.6 oz (96 kg)        Assessment & Plan:  Non-intractable vomiting with nausea, unspecified vomiting type  Lower abdominal pain - Plan: CT Abdomen Pelvis Wo  Contrast  Non-intractable cyclical vomiting with nausea - Plan: CT Abdomen Pelvis Wo Contrast  Clinically, I believe the patient is dealing with gastroparesis and Believe that her symptoms have improved slightly on the Reglan. I am concerned about the worsening lower abdominal pain without alleviating or exacerbating factors particular given the new onset nausea and vomiting. Although the nausea and vomiting is better after the addition of Reglan, it is certainly not relieved completely. Therefore we discussed workup including a CT scan of the abdomen and pelvis to evaluate for obstructive mass. We also discussed GI referral for possible EGD. Patient has a history of contrast allergy. Together we have decided to proceed with a noncontrast CT scan of the abdomen and pelvis to evaluate for an obstructive mass so that the family can determine the next best course of action. If CT scan is negative, I would then recommend a GI referral.

## 2017-09-01 ENCOUNTER — Ambulatory Visit
Admission: RE | Admit: 2017-09-01 | Discharge: 2017-09-01 | Disposition: A | Discharge status: Home / Self Care | Payer: Medicare Other | Source: Ambulatory Visit | Attending: Family Medicine | Admitting: Family Medicine

## 2017-09-01 DIAGNOSIS — R1030 Lower abdominal pain, unspecified: Secondary | ICD-10-CM | POA: Diagnosis not present

## 2017-09-01 DIAGNOSIS — R1115 Cyclical vomiting syndrome unrelated to migraine: Secondary | ICD-10-CM

## 2017-09-02 ENCOUNTER — Other Ambulatory Visit: Payer: Self-pay | Admitting: Family Medicine

## 2017-09-02 DIAGNOSIS — R1032 Left lower quadrant pain: Principal | ICD-10-CM

## 2017-09-02 DIAGNOSIS — R1031 Right lower quadrant pain: Principal | ICD-10-CM

## 2017-09-02 DIAGNOSIS — G8929 Other chronic pain: Secondary | ICD-10-CM

## 2017-09-03 ENCOUNTER — Other Ambulatory Visit: Payer: Self-pay | Admitting: Family Medicine

## 2017-09-29 ENCOUNTER — Other Ambulatory Visit: Payer: Self-pay

## 2017-09-29 ENCOUNTER — Encounter (HOSPITAL_COMMUNITY): Payer: Self-pay

## 2017-09-29 ENCOUNTER — Emergency Department (HOSPITAL_COMMUNITY): Payer: Medicare Other

## 2017-09-29 ENCOUNTER — Observation Stay (HOSPITAL_COMMUNITY): Payer: Medicare Other

## 2017-09-29 ENCOUNTER — Observation Stay (HOSPITAL_COMMUNITY)
Admission: EM | Admit: 2017-09-29 | Discharge: 2017-10-01 | Disposition: A | Discharge status: Home / Self Care | Payer: Medicare Other | Attending: Family Medicine | Admitting: Family Medicine

## 2017-09-29 DIAGNOSIS — Z7901 Long term (current) use of anticoagulants: Secondary | ICD-10-CM | POA: Diagnosis not present

## 2017-09-29 DIAGNOSIS — Z9114 Patient's other noncompliance with medication regimen: Secondary | ICD-10-CM | POA: Diagnosis not present

## 2017-09-29 DIAGNOSIS — E119 Type 2 diabetes mellitus without complications: Secondary | ICD-10-CM | POA: Insufficient documentation

## 2017-09-29 DIAGNOSIS — I2510 Atherosclerotic heart disease of native coronary artery without angina pectoris: Secondary | ICD-10-CM | POA: Insufficient documentation

## 2017-09-29 DIAGNOSIS — I6789 Other cerebrovascular disease: Secondary | ICD-10-CM | POA: Diagnosis not present

## 2017-09-29 DIAGNOSIS — Z951 Presence of aortocoronary bypass graft: Secondary | ICD-10-CM | POA: Diagnosis not present

## 2017-09-29 DIAGNOSIS — E039 Hypothyroidism, unspecified: Secondary | ICD-10-CM | POA: Diagnosis not present

## 2017-09-29 DIAGNOSIS — I110 Hypertensive heart disease with heart failure: Secondary | ICD-10-CM | POA: Insufficient documentation

## 2017-09-29 DIAGNOSIS — G459 Transient cerebral ischemic attack, unspecified: Principal | ICD-10-CM | POA: Insufficient documentation

## 2017-09-29 DIAGNOSIS — R4701 Aphasia: Secondary | ICD-10-CM | POA: Diagnosis not present

## 2017-09-29 DIAGNOSIS — I10 Essential (primary) hypertension: Secondary | ICD-10-CM | POA: Diagnosis present

## 2017-09-29 DIAGNOSIS — I480 Paroxysmal atrial fibrillation: Secondary | ICD-10-CM | POA: Diagnosis not present

## 2017-09-29 DIAGNOSIS — J449 Chronic obstructive pulmonary disease, unspecified: Secondary | ICD-10-CM | POA: Diagnosis not present

## 2017-09-29 DIAGNOSIS — F0390 Unspecified dementia without behavioral disturbance: Secondary | ICD-10-CM | POA: Diagnosis present

## 2017-09-29 DIAGNOSIS — I5031 Acute diastolic (congestive) heart failure: Secondary | ICD-10-CM | POA: Diagnosis not present

## 2017-09-29 DIAGNOSIS — I482 Chronic atrial fibrillation, unspecified: Secondary | ICD-10-CM | POA: Diagnosis present

## 2017-09-29 DIAGNOSIS — R4781 Slurred speech: Secondary | ICD-10-CM | POA: Diagnosis not present

## 2017-09-29 DIAGNOSIS — R410 Disorientation, unspecified: Secondary | ICD-10-CM | POA: Diagnosis not present

## 2017-09-29 DIAGNOSIS — R51 Headache: Secondary | ICD-10-CM | POA: Diagnosis not present

## 2017-09-29 DIAGNOSIS — E785 Hyperlipidemia, unspecified: Secondary | ICD-10-CM | POA: Diagnosis not present

## 2017-09-29 HISTORY — DX: Unspecified dementia, unspecified severity, without behavioral disturbance, psychotic disturbance, mood disturbance, and anxiety: F03.90

## 2017-09-29 LAB — CBC WITH DIFFERENTIAL/PLATELET
Basophils Absolute: 0 10*3/uL (ref 0.0–0.1)
Basophils Relative: 0 %
EOS ABS: 0.2 10*3/uL (ref 0.0–0.7)
EOS PCT: 2 %
HCT: 42.2 % (ref 36.0–46.0)
Hemoglobin: 14.6 g/dL (ref 12.0–15.0)
LYMPHS ABS: 1.6 10*3/uL (ref 0.7–4.0)
LYMPHS PCT: 16 %
MCH: 33 pg (ref 26.0–34.0)
MCHC: 34.6 g/dL (ref 30.0–36.0)
MCV: 95.5 fL (ref 78.0–100.0)
MONOS PCT: 15 %
Monocytes Absolute: 1.5 10*3/uL — ABNORMAL HIGH (ref 0.1–1.0)
Neutro Abs: 6.6 10*3/uL (ref 1.7–7.7)
Neutrophils Relative %: 67 %
PLATELETS: 214 10*3/uL (ref 150–400)
RBC: 4.42 MIL/uL (ref 3.87–5.11)
RDW: 12.6 % (ref 11.5–15.5)
WBC: 9.9 10*3/uL (ref 4.0–10.5)

## 2017-09-29 LAB — COMPREHENSIVE METABOLIC PANEL
ALT: 14 U/L (ref 14–54)
AST: 23 U/L (ref 15–41)
Albumin: 3.4 g/dL — ABNORMAL LOW (ref 3.5–5.0)
Alkaline Phosphatase: 69 U/L (ref 38–126)
Anion gap: 13 (ref 5–15)
BUN: 18 mg/dL (ref 6–20)
CO2: 21 mmol/L — ABNORMAL LOW (ref 22–32)
Calcium: 9.2 mg/dL (ref 8.9–10.3)
Chloride: 103 mmol/L (ref 101–111)
Creatinine, Ser: 1.19 mg/dL — ABNORMAL HIGH (ref 0.44–1.00)
GFR calc Af Amer: 49 mL/min — ABNORMAL LOW (ref >60)
GFR calc non Af Amer: 42 mL/min — ABNORMAL LOW (ref >60)
Glucose, Bld: 220 mg/dL — ABNORMAL HIGH (ref 65–99)
Potassium: 3.7 mmol/L (ref 3.5–5.1)
Sodium: 137 mmol/L (ref 135–145)
Total Bilirubin: 0.7 mg/dL (ref 0.3–1.2)
Total Protein: 7.4 g/dL (ref 6.5–8.1)

## 2017-09-29 LAB — I-STAT CHEM 8, ED
BUN: 19 mg/dL (ref 6–20)
CALCIUM ION: 1.09 mmol/L — AB (ref 1.15–1.40)
CHLORIDE: 104 mmol/L (ref 101–111)
Creatinine, Ser: 1 mg/dL (ref 0.44–1.00)
GLUCOSE: 223 mg/dL — AB (ref 65–99)
HCT: 44 % (ref 36.0–46.0)
Hemoglobin: 15 g/dL (ref 12.0–15.0)
POTASSIUM: 3.8 mmol/L (ref 3.5–5.1)
Sodium: 140 mmol/L (ref 135–145)
TCO2: 23 mmol/L (ref 22–32)

## 2017-09-29 LAB — I-STAT TROPONIN, ED: Troponin i, poc: 0 ng/mL (ref 0.00–0.08)

## 2017-09-29 LAB — GLUCOSE, CAPILLARY: GLUCOSE-CAPILLARY: 125 mg/dL — AB (ref 65–99)

## 2017-09-29 LAB — APTT: aPTT: 30 seconds (ref 24–36)

## 2017-09-29 LAB — PROTIME-INR
INR: 1.29
PROTHROMBIN TIME: 16 s — AB (ref 11.4–15.2)

## 2017-09-29 MED ORDER — INSULIN ASPART 100 UNIT/ML ~~LOC~~ SOLN: 0.0000 [IU] | Freq: Every day | SUBCUTANEOUS | Status: DC

## 2017-09-29 MED ORDER — BUDESONIDE 0.25 MG/2ML IN SUSP: 0.2500 mg | RESPIRATORY_TRACT | Status: DC

## 2017-09-29 MED ORDER — METOCLOPRAMIDE HCL 5 MG/ML IJ SOLN
10.0000 mg | Freq: Once | INTRAMUSCULAR | Status: AC
  Administered 2017-09-29: 10 mg via INTRAVENOUS
  Filled 2017-09-29: qty 2

## 2017-09-29 MED ORDER — ACETAMINOPHEN 325 MG PO TABS: 650.0000 mg | ORAL_TABLET | Freq: Once | ORAL | Status: DC

## 2017-09-29 MED ORDER — INSULIN ASPART 100 UNIT/ML ~~LOC~~ SOLN
0.0000 [IU] | Freq: Three times a day (TID) | SUBCUTANEOUS | Status: DC
  Administered 2017-09-30: 3 [IU] via SUBCUTANEOUS
  Administered 2017-09-30: 2 [IU] via SUBCUTANEOUS
  Administered 2017-09-30: 3 [IU] via SUBCUTANEOUS
  Administered 2017-10-01: 2 [IU] via SUBCUTANEOUS
  Administered 2017-10-01: 5 [IU] via SUBCUTANEOUS

## 2017-09-29 MED ORDER — ACETAMINOPHEN 650 MG RE SUPP
650.0000 mg | RECTAL | Status: DC | PRN
Start: 2017-09-29 — End: 2017-10-01

## 2017-09-29 MED ORDER — LEVOTHYROXINE SODIUM 112 MCG PO TABS
112.0000 ug | ORAL_TABLET | Freq: Every day | ORAL | Status: DC
  Administered 2017-09-30 – 2017-10-01 (×2): 112 ug via ORAL
  Filled 2017-09-29 (×2): qty 1

## 2017-09-29 MED ORDER — ONDANSETRON HCL 4 MG/2ML IJ SOLN: 4.0000 mg | Freq: Four times a day (QID) | INTRAMUSCULAR | Status: DC | PRN

## 2017-09-29 MED ORDER — EDOXABAN TOSYLATE 60 MG PO TABS
60.0000 mg | ORAL_TABLET | ORAL | Status: DC
  Filled 2017-09-29: qty 60

## 2017-09-29 MED ORDER — DONEPEZIL HCL 10 MG PO TABS
10.0000 mg | ORAL_TABLET | Freq: Every day | ORAL | Status: DC
  Administered 2017-09-29 – 2017-09-30 (×2): 10 mg via ORAL
  Filled 2017-09-29 (×2): qty 1

## 2017-09-29 MED ORDER — ATORVASTATIN CALCIUM 40 MG PO TABS
40.0000 mg | ORAL_TABLET | Freq: Every day | ORAL | Status: DC
  Administered 2017-09-30: 40 mg via ORAL
  Filled 2017-09-29: qty 1

## 2017-09-29 MED ORDER — EDOXABAN TOSYLATE 60 MG PO TABS
60.0000 mg | ORAL_TABLET | ORAL | Status: DC
  Administered 2017-09-30: 60 mg via ORAL
  Filled 2017-09-29: qty 60

## 2017-09-29 MED ORDER — ALBUTEROL SULFATE (2.5 MG/3ML) 0.083% IN NEBU: 2.5000 mg | INHALATION_SOLUTION | RESPIRATORY_TRACT | Status: DC | PRN

## 2017-09-29 MED ORDER — ASPIRIN EC 81 MG PO TBEC
81.0000 mg | DELAYED_RELEASE_TABLET | Freq: Every day | ORAL | Status: DC
  Administered 2017-09-30 – 2017-10-01 (×2): 81 mg via ORAL
  Filled 2017-09-29 (×2): qty 1

## 2017-09-29 MED ORDER — SENNOSIDES-DOCUSATE SODIUM 8.6-50 MG PO TABS: 1.0000 | ORAL_TABLET | Freq: Every evening | ORAL | Status: DC | PRN

## 2017-09-29 MED ORDER — ACETAMINOPHEN 325 MG PO TABS: 650.0000 mg | ORAL_TABLET | ORAL | Status: DC | PRN

## 2017-09-29 MED ORDER — METOCLOPRAMIDE HCL 5 MG PO TABS
5.0000 mg | ORAL_TABLET | Freq: Three times a day (TID) | ORAL | Status: DC
  Administered 2017-09-29 – 2017-10-01 (×7): 5 mg via ORAL
  Filled 2017-09-29 (×7): qty 1

## 2017-09-29 MED ORDER — STROKE: EARLY STAGES OF RECOVERY BOOK: Freq: Once | Status: DC

## 2017-09-29 MED ORDER — ACETAMINOPHEN 160 MG/5ML PO SOLN: 650.0000 mg | ORAL | Status: DC | PRN

## 2017-09-29 MED ORDER — SODIUM CHLORIDE 0.9 % IV SOLN: INTRAVENOUS | Status: DC

## 2017-09-29 MED ORDER — ESCITALOPRAM OXALATE 10 MG PO TABS
10.0000 mg | ORAL_TABLET | Freq: Every day | ORAL | Status: DC
  Administered 2017-09-30 – 2017-10-01 (×2): 10 mg via ORAL
  Filled 2017-09-29 (×2): qty 1

## 2017-09-29 MED ORDER — EDOXABAN TOSYLATE 60 MG PO TABS
60.0000 mg | ORAL_TABLET | Freq: Once | ORAL | Status: AC
  Administered 2017-09-29: 60 mg via ORAL
  Filled 2017-09-29: qty 60

## 2017-09-29 NOTE — ED Provider Notes (Signed)
[facility] EMERGENCY DEPARTMENT Provider Note   CSN: [id number] Arrival date & time: 09/29/17  1519   An emergency department physician performed an initial assessment on this suspected stroke patient at 1513(floyd).  History   Chief Complaint Chief Complaint  Patient presents with  . Code Stroke    HPI Lindsay Nguyen is a 82 y.o. female with history of persistent atrial fibrillation, CAD, diabetes, hypertension who presents following episode of confusion and left-sided weakness that resolved about 30 minutes.  Patient reports she went to the bathroom around 2:30 PM today and came out and was confused.  She  reportedly had slurred speech, which resolved with the confusion.  Patient reports being at her baseline now.  She denies having any weakness, numbness or tingling, or visual disturbances.  She has history of stroke in the past, however her daughter reports never knowing about them and only finding out about the on a later scan.  Patient reports having a mild left frontal headache that has been persistent since the episode of confusion.  Patient denies any chest pain, shortness of breath, current abdominal pain, nausea, vomiting, urinary symptoms.  Patient has had intermittent abdominal pain over the past several weeks.  She had an appointment today with gastroenterology, however did not make it.  It is thought that she may be having issues with gastroparesis.  She has been taking Reglan.  She had a negative CT scan 2 or 3 weeks ago, per patient's daughter.  Patient is anticoagulated on Eliquis, but does not always take her morning dose, according to her daughter.  HPI  Past Medical History:  Diagnosis Date  . Anemia   . Atrial fibrillation ([place])    persistent; on Eliquis  . CAD (coronary artery disease)    2D ECHO, 11/04/2010 - EF >55%, mild-moderate mitral regurgitation, moderate tricuspid regurgitation  . Dementia   . Diabetes mellitus   . GERD (gastroesophageal  reflux disease)   . Hyperlipidemia   . Hypertension   . Non-STEMI (non-ST elevated myocardial infarction) (HCC)    NUCLEAR STRESS TEST, 11/04/2010 - normal, no ischemia  . Osteopenia   . Pain in limb    LEA VENOUS DUPLEX, 06/27/2010 - no evidence of DVT  . Thoracic aortic aneurysm (HCC)    status post resection and grafting  . Thyroid disease    hypothyroid    Patient Active Problem List   Diagnosis Date Noted  . TIA (transient ischemic attack) 09/29/2017  . History of depression 11/17/2016  . Bradycardia, sinus 10/22/2015  . Acute diastolic (congestive) heart failure ([place]) 10/22/2015  . Chronic anticoagulation 10/22/2015  . 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 PAF (paroxysmal atrial fibrillation) ([place]) 08/07/2015  . Metabolic disorder, iron 73/41/9379  . PVD (peripheral vascular disease)-  01/20/2013  . Bronchitis 01/20/2013  . Bronchopneumonia 04/04/2011  . Myalgia 10/26/2010  . PLEURAL EFFUSION, RIGHT 08/20/2010  . Hx of CABG 05/01/2010  . Hypothyroidism 08/20/2009  . Controlled diabetes mellitus type II without complication ([place]) 02/40/9735  . HLD (hyperlipidemia) 08/20/2009  . Essential hypertension 08/20/2009  . GERD 08/20/2009  . OSTEOPENIA 08/20/2009    Past Surgical History:  Procedure Laterality Date  . CARDIAC CATHETERIZATION  06/18/2010   Recommended CABG  . CARDIOVERSION N/A 09/30/2015   Procedure: CARDIOVERSION;  Surgeon: [name], MD;  Location: [facility] ENDOSCOPY;  Service: Cardiovascular;  Laterality: N/A;  . CARDIOVERSION N/A 06/05/2016   Procedure: CARDIOVERSION;  Surgeon: [name], MD;  Location: [facility] ENDOSCOPY;  Service: Cardiovascular;  Laterality: N/A;  . CHOLECYSTECTOMY    . CORONARY ARTERY BYPASS GRAFT  06/18/2010   LIMA-LAD, vein-diagonal branch, vein-obtuse marginal branch, and resectioning and grafting of throacic aortic aneurysm  . ELBOW SURGERY  1979   fracture left  . THORACIC AORTIC ANEURYSM REPAIR    . TONSILLECTOMY  1945  . TUBAL  LIGATION  1971    OB History    No data available       Home Medications    Prior to Admission medications   Medication Sig Start Date End Date Taking? Authorizing Provider  albuterol (PROVENTIL) (2.5 MG/3ML) 0.083% nebulizer solution INHALE 1 NEBULE VIA NEBULIZER Q 2 HOURS PRN FOR WHEEZING OR SHORTNESS OF BREATH 05/24/16  Yes [provider]  amLODipine (NORVASC) 2.5 MG tablet TAKE 1 TABLET(2.5 MG) BY MOUTH DAILY 09/03/17  Yes [name], MD  aspirin EC 81 MG tablet Take 1 tablet (81 mg total) by mouth daily. 08/07/15  Yes [name], MD  atorvastatin (LIPITOR) 10 MG tablet TAKE 1 TABLET(10 MG) BY MOUTH DAILY AT 6 PM 09/03/17  Yes [name], MD  budesonide (PULMICORT) 0.25 MG/2ML nebulizer solution Take 0.25 mg by nebulization as directed.    Yes [provider]  carvedilol (COREG) 12.5 MG tablet TAKE 1&1/2 TABLETS BY MOUTH EVERY MORNING AND 1 TABLET EVERY EVENING 06/09/17  Yes [name], MD  ELIQUIS 5 MG TABS tablet TAKE 1 TABLET(5 MG) BY MOUTH TWICE DAILY 08/08/17  Yes [name], MD  escitalopram (LEXAPRO) 10 MG tablet TAKE 1 TABLET(10 MG) BY MOUTH DAILY 09/03/17  Yes [name], MD  furosemide (LASIX) 40 MG tablet TAKE 1 TABLET BY MOUTH DAILY AS NEEDED FOR FLUID RETENTION 02/08/17  Yes [name], MD  glimepiride (AMARYL) 2 MG tablet TAKE 1 TABLET(2 MG) BY MOUTH TWICE DAILY 08/09/17  Yes [name], MD  loratadine (CLARITIN) 10 MG tablet Take 10 mg by mouth daily as needed for allergies.   Yes [provider]  losartan (COZAAR) 50 MG tablet Take 1 tablet (50 mg total) by mouth daily. 03/01/17  Yes [name], MD  metoCLOPramide (REGLAN) 5 MG tablet Take 1 tablet (5 mg total) by mouth 4 (four) times daily -  before meals and at bedtime.   Yes [name], MD  nitroGLYCERIN (NITROSTAT) 0.4 MG SL tablet Place 0.4 mg under the tongue every 5 (five) minutes as needed for chest pain (MAX 3 TABLETS).    Yes  [provider]  ONGLYZA 5 MG TABS tablet Take 1 tablet (5 mg total) by mouth daily. 03/01/17  Yes [name], MD  SYNTHROID 112 MCG tablet TAKE 1/2 TABLET BY MOUTH DAILY BEFORE BREAKFAST 04/27/17  Yes [name], MD  donepezil (ARICEPT) 10 MG tablet TAKE 1 TABLET(10 MG) BY MOUTH AT BEDTIME 08/09/17   [name], MD    Family History Family History  Problem Relation Age of Onset  . Hypertension Mother   . Osteoporosis Mother   . Heart disease Father   . Stroke Father   . Alzheimer's disease Father   . Heart attack Father     Social History Social History   Tobacco Use  . Smoking status: Never Smoker  . Smokeless tobacco: Never Used  Substance Use Topics  . Alcohol use: No  . Drug use: No     Allergies   Contrast media [iodinated diagnostic agents]; Iodine; Shellfish allergy; and Gabapentin  Review of Systems Review of Systems  Constitutional: Negative for chills and fever.  HENT: Negative for facial swelling and sore throat.   Eyes: Negative for visual disturbance.  Respiratory: Negative for shortness of breath.   Cardiovascular: Negative for chest pain.  Gastrointestinal: Positive for abdominal pain (intermittent for several weeks, none at this time). Negative for nausea and vomiting.  Genitourinary: Negative for dysuria.  Musculoskeletal: Negative for back pain.  Skin: Negative for rash and wound.  Neurological: Positive for speech difficulty and headaches. Negative for weakness and numbness.  Psychiatric/Behavioral: Positive for confusion. The patient is not nervous/anxious.      Physical Exam Updated Vital Signs BP (!) 151/87   Pulse 71   Temp 98.7 F (37.1 C)   Resp 17   SpO2 94%   Physical Exam  Constitutional: She appears well-developed and well-nourished. No distress.  HENT:  Head: Normocephalic and atraumatic.  Mouth/Throat: Oropharynx is clear and moist. No oropharyngeal exudate.  Eyes: Conjunctivae are normal. Pupils  are equal, round, and reactive to light. Right eye exhibits no discharge. Left eye exhibits no discharge. No scleral icterus.  Neck: Normal range of motion. Neck supple. No thyromegaly present.  Cardiovascular: Normal rate, regular rhythm, normal heart sounds and intact distal pulses. Exam reveals no gallop and no friction rub.  No murmur heard. Pulmonary/Chest: Effort normal and breath sounds normal. No stridor. No respiratory distress. She has no wheezes. She has no rales.  Abdominal: Soft. Bowel sounds are normal. She exhibits no distension. There is no tenderness. There is no rebound and no guarding.  Musculoskeletal: She exhibits no edema.  Lymphadenopathy:    She has no cervical adenopathy.  Neurological: She is alert. Coordination normal. GCS eye subscore is 4. GCS verbal subscore is 5. GCS motor subscore is 6.  CN 3-12 intact; normal sensation throughout; 5/5 strength in all 4 extremities; equal bilateral grip strength; no ataxia on finger to nose; very subtle left pronator drift NIH 0  Skin: Skin is warm and dry. No rash noted. She is not diaphoretic. No pallor.  Psychiatric: She has a normal mood and affect.  Nursing note and vitals reviewed.    ED Treatments / Results  Labs (all labs ordered are listed, but only abnormal results are displayed) Labs Reviewed  CBC WITH DIFFERENTIAL/PLATELET - Abnormal; Notable for the following components:      Result Value   Monocytes Absolute 1.5 (*)    All other components within normal limits  PROTIME-INR - Abnormal; Notable for the following components:   Prothrombin Time 16.0 (*)    All other components within normal limits  COMPREHENSIVE METABOLIC PANEL - Abnormal; Notable for the following components:   CO2 21 (*)    Glucose, Bld 220 (*)    Creatinine, Ser 1.19 (*)    Albumin 3.4 (*)    GFR calc non Af Amer 42 (*)    GFR calc Af Amer 49 (*)    All other components within normal limits  I-STAT CHEM 8, ED - Abnormal; Notable for  the following components:   Glucose, Bld 223 (*)    Calcium, Ion 1.09 (*)    All other components within normal limits  APTT  URINALYSIS, ROUTINE W REFLEX MICROSCOPIC  I-STAT TROPONIN, ED    EKG  EKG Interpretation  Date/Time:  Wednesday September 29 2017 15:31:39 EST Ventricular Rate:  72 PR Interval:    QRS Duration: 128 QT Interval:  417 QTC Calculation: 457 R Axis:   -61 Text  Interpretation:  Atrial fibrillation LVH with IVCD, LAD and secondary repol abnrm No significant change since last tracing Confirmed by Deno Etienne [phone]) on 09/29/2017 3:58:55 PM       Radiology Ct Head Code Stroke Wo Contrast  Result Date: 09/29/2017 CLINICAL DATA:  Code stroke. Acute onset of slurred speech and left-sided weakness. EXAM: CT HEAD WITHOUT CONTRAST TECHNIQUE: Contiguous axial images were obtained from the base of the skull through the vertex without intravenous contrast. COMPARISON:  MRI brain 11/05/2016. FINDINGS: Brain: Moderate periventricular and subcortical white matter hypoattenuation is again seen bilaterally. Remote lacunar infarcts are present within the cerebellum. No acute infarct, hemorrhage, or mass lesion is present. The brainstem is normal. Ventricles are proportionate to the degree of atrophy. No significant extra-axial fluid collection is present. Vascular: Atherosclerotic calcifications are present within the cavernous internal carotid arteries bilaterally. Asymmetric hyperdensity is present in the left MCA. Skull: Calvarium is intact. No focal lytic or blastic lesions are present. Sinuses/Orbits: Chronic left maxillary sinus disease is present. The remaining paranasal sinuses and mastoid air cells are clear. Globes and orbits are within normal limits. ASPECTS [facility] Stroke Program Early CT Score) - Ganglionic level infarction (caudate, lentiform nuclei, internal capsule, insula, M1-M3 cortex): 7/7 - Supraganglionic infarction (M4-M6 cortex): 3/3 Total score (0-10 with 10 being  normal): 10/10 IMPRESSION: 1. Stable atrophy and white matter disease. 2. Stable remote lacunar infarcts of the cerebellum. 3. Question hyperdense left MCA. This does not correspond with the patient's left-sided defect, but could be related to speech abnormalities. 4. ASPECTS is 10/10 The above was relayed via text pager to Dr. Rosalin Hawking on 09/29/2017 at 15:39 . Electronically Signed   By: San Morelle M.D.   On: 09/29/2017 15:40    Procedures Procedures (including critical care time)  Medications Ordered in ED Medications  metoCLOPramide (REGLAN) injection 10 mg (10 mg Intravenous Given 09/29/17 1617)     Initial Impression / Assessment and Plan / ED Course  I have reviewed the triage vital signs and the nursing notes.  Pertinent labs & imaging results that were available during my care of the patient were reviewed by me and considered in my medical decision making (see chart for details).  Clinical Course as of Sep 30 1739  Wed Sep 29, 2017  1555 Dr. Erlinda Hong, neurologist, recommend hospital admission for stroke work-up.  He will put an orders for further imaging (MRI, etc.). He requests permissive hypertension.  [AL]    Clinical Course User Index [AL] Frederica Kuster, PA-C    Patient presenting with code stroke.  Patient is back to baseline with lingering mild headache.  Normal neuro exam without focal deficits.  Patient evaluated by neurologist, Dr. Erlinda Hong, who recommends admission for stroke workup and maximization of medication.  He feels patient may have had TIA, potentially due to missed Eliquis doses.  CT head shows stable atrophy and white matter disease, stable remote lacunar infarcts of the cerebellum, question hyperdense left MCA, but does not correspond with patient's left-sided defect, but could be related to speech abnormalities.  My attending, Dr. Tyrone Nine, spoke with the admitting doctor, Dr. Lorin Mercy, who will see the patient and admit for observation and stroke workup.  Patient  also evaluated by Dr. Tyrone Nine who agrees with plan.  Final Clinical Impressions(s) / ED Diagnoses   Final diagnoses:  TIA (transient ischemic attack)    ED Discharge Orders    None       Frederica Kuster, PA-C 09/29/17 1741  Deno Etienne, DO 09/29/17 1806

## 2017-09-29 NOTE — H&P (Signed)
History and Physical    Lindsay Snavely WCH:[id number] DOB: 07-20-1936 DOA: 09/29/2017  PCP: [name], MD Consultants:  [facility] Found - cardiology; [place] - GI Patient coming from: Home - lives with son; [facility]: son or daughter, [phone]  Chief Complaint: slurred speech  HPI: Lindsay Nguyen is a 82 y.o. female with medical history significant of hypothyroidism; AAA s/p grafting; CAD; HTN; HLD; DM; and afib on Eliquis (once daily at best) presenting after she developed slurred speech, was a bit unsteady on her feet, perhaps became confused.  Symptoms lasted for at least 20-30 minutes.  She went into the bathroom and was fine.  She came out and was trying to put her jacket on and couldn't figure out what to do with it.  She looked at her daughter funny and talked some gibberish.  She did know her children.  She couldn't say that it was Wednesday.  She had facial drooping.  Things worsened with her speech, she was leaning to the left and so they called EMS.  She is completely normal now other than mild confusion - has baseline dementia.  No swallow dysfunction.  Daughter thinks she had tongue deviation to the right, but this resolved.    She does have intermittent urinary (and fecal?) incontinence as well as gatroparesis with chronic nausea.  She had an appointment to see Dr. Cristina Gong today but was unable to make it since she developed the aforementioned symptoms.   ED Course: Code stroke - symptoms resolved.  Negative CT.  Observation for CVA/TIA work-up.  Review of Systems: As per HPI; otherwise review of systems reviewed and negative.   Ambulatory Status:  Ambulates without assistance  Past Medical History:  Diagnosis Date  . Anemia   . Atrial fibrillation ([place])    persistent; on Eliquis  . CAD (coronary artery disease)    2D ECHO, 11/04/2010 - EF >55%, mild-moderate mitral regurgitation, moderate tricuspid regurgitation  . Dementia   . Diabetes mellitus   . GERD (gastroesophageal reflux  disease)   . Hyperlipidemia   . Hypertension   . Non-STEMI (non-ST elevated myocardial infarction) (HCC)    NUCLEAR STRESS TEST, 11/04/2010 - normal, no ischemia  . Osteopenia   . Pain in limb    LEA VENOUS DUPLEX, 06/27/2010 - no evidence of DVT  . Thoracic aortic aneurysm (HCC)    status post resection and grafting  . Thyroid disease    hypothyroid    Past Surgical History:  Procedure Laterality Date  . CARDIAC CATHETERIZATION  06/18/2010   Recommended CABG  . CARDIOVERSION N/A 09/30/2015   Procedure: CARDIOVERSION;  Surgeon: [name], MD;  Location: [facility] ENDOSCOPY;  Service: Cardiovascular;  Laterality: N/A;  . CARDIOVERSION N/A 06/05/2016   Procedure: CARDIOVERSION;  Surgeon: [name], MD;  Location: [facility] ENDOSCOPY;  Service: Cardiovascular;  Laterality: N/A;  . CHOLECYSTECTOMY    . CORONARY ARTERY BYPASS GRAFT  06/18/2010   LIMA-LAD, vein-diagonal branch, vein-obtuse marginal branch, and resectioning and grafting of throacic aortic aneurysm  . ELBOW SURGERY  1979   fracture left  . THORACIC AORTIC ANEURYSM REPAIR    . TONSILLECTOMY  1945  . TUBAL LIGATION  1971    Social History   Socioeconomic History  . Marital status: Married    Spouse name: Not on file  . Number of children: Not on file  . Years of education: Not on file  . Highest education level: Not on file  Social Needs  . Financial resource strain: Not  on file  . Food insecurity - worry: Not on file  . Food insecurity - inability: Not on file  . Transportation needs - medical: Not on file  . Transportation needs - non-medical: Not on file  Occupational History  . Not on file  Tobacco Use  . Smoking status: Never Smoker  . Smokeless tobacco: Never Used  Substance and Sexual Activity  . Alcohol use: No  . Drug use: No  . Sexual activity: Not on file  Other Topics Concern  . Not on file  Social History Narrative   Patient's daughter is a Designer, jewellery who does hospitalist work at [employer].  Her son is also in health care.    Allergies  Allergen Reactions  . Contrast Media [Iodinated Diagnostic Agents] Anaphylaxis and Swelling  . Iodine Anaphylaxis and Swelling  . Shellfish Allergy Anaphylaxis    unknown  . Gabapentin Hives    Family History  Problem Relation Age of Onset  . Hypertension Mother   . Osteoporosis Mother   . Heart disease Father   . Stroke Father   . Alzheimer's disease Father   . Heart attack Father     Prior to Admission medications   Medication Sig Start Date End Date Taking? Authorizing Provider  albuterol (PROVENTIL) (2.5 MG/3ML) 0.083% nebulizer solution INHALE 1 NEBULE VIA NEBULIZER Q 2 HOURS PRN FOR WHEEZING OR SHORTNESS OF BREATH 05/24/16   [provider]  amLODipine (NORVASC) 2.5 MG tablet TAKE 1 TABLET(2.5 MG) BY MOUTH DAILY 09/03/17   [name], MD  aspirin EC 81 MG tablet Take 1 tablet (81 mg total) by mouth daily. 08/07/15   [name], MD  atorvastatin (LIPITOR) 10 MG tablet TAKE 1 TABLET(10 MG) BY MOUTH DAILY AT 6 PM 09/03/17   [name], MD  budesonide (PULMICORT) 0.25 MG/2ML nebulizer solution Take 0.25 mg by nebulization as directed.     [provider]  carvedilol (COREG) 12.5 MG tablet TAKE 1&1/2 TABLETS BY MOUTH EVERY MORNING AND 1 TABLET EVERY EVENING 06/09/17   [name], MD  donepezil (ARICEPT) 10 MG tablet TAKE 1 TABLET(10 MG) BY MOUTH AT BEDTIME 08/09/17   [name], MD  ELIQUIS 5 MG TABS tablet TAKE 1 TABLET(5 MG) BY MOUTH TWICE DAILY 08/08/17   [name], MD  escitalopram (LEXAPRO) 10 MG tablet TAKE 1 TABLET(10 MG) BY MOUTH DAILY 09/03/17   [name], MD  furosemide (LASIX) 40 MG tablet TAKE 1 TABLET BY MOUTH DAILY AS NEEDED FOR FLUID RETENTION 02/08/17   [name], MD  glimepiride (AMARYL) 2 MG tablet TAKE 1 TABLET(2 MG) BY MOUTH TWICE DAILY 08/09/17   [name], MD  loratadine (CLARITIN) 10 MG tablet Take 10 mg by mouth daily as needed for  allergies.    [provider]  losartan (COZAAR) 50 MG tablet Take 1 tablet (50 mg total) by mouth daily. 03/01/17   [name], MD  metoCLOPramide (REGLAN) 5 MG tablet Take 1 tablet (5 mg total) by mouth 4 (four) times daily -  before meals and at bedtime.    [name], MD  nitroGLYCERIN (NITROSTAT) 0.4 MG SL tablet Place 0.4 mg under the tongue every 5 (five) minutes as needed for chest pain (MAX 3 TABLETS).     [provider]  ONGLYZA 5 MG TABS tablet Take 1 tablet (5 mg total) by mouth daily. 03/01/17   [name], MD  SYNTHROID 112 MCG  tablet TAKE 1/2 TABLET BY MOUTH DAILY BEFORE BREAKFAST 04/27/17   [name], MD    Physical Exam: Vitals:   09/29/17 1543 09/29/17 1630 09/29/17 1700 09/29/17 1730  BP:  (!) 147/84 (!) 155/88 (!) 151/87  Pulse:  67 67 71  Resp:  11 (!) 22 17  Temp: 98.7 F (37.1 C)     TempSrc:      SpO2:  94% 96% 94%     General:  Appears calm and comfortable and is NAD Eyes:   EOMI, normal lids, iris ENT:  grossly normal hearing, lips & tongue, mmm Neck:  no LAD, masses or thyromegaly; no carotid bruits Cardiovascular:  RRR, no m/r/g. Respiratory:   CTA bilaterally with no wheezes/rales/rhonchi.  Normal respiratory effort. Abdomen:  soft, NT, ND, NABS Skin:  no rash or induration seen on limited exam Musculoskeletal:  grossly normal tone BUE/BLE, good ROM, no bony abnormality Lower extremity:  Trace  LE edema.  2+ distal pulses. Psychiatric:  grossly normal mood and affect, speech fluent and appropriate, AOx3 Neurologic:  CN 2-12 grossly intact, moves all extremities in coordinated fashion, sensation intact    Radiological Exams on Admission: Ct Head Code Stroke Wo Contrast  Result Date: 09/29/2017 CLINICAL DATA:  Code stroke. Acute onset of slurred speech and left-sided weakness. EXAM: CT HEAD WITHOUT CONTRAST TECHNIQUE: Contiguous axial images were obtained from the base of the skull through the vertex  without intravenous contrast. COMPARISON:  MRI brain 11/05/2016. FINDINGS: Brain: Moderate periventricular and subcortical white matter hypoattenuation is again seen bilaterally. Remote lacunar infarcts are present within the cerebellum. No acute infarct, hemorrhage, or mass lesion is present. The brainstem is normal. Ventricles are proportionate to the degree of atrophy. No significant extra-axial fluid collection is present. Vascular: Atherosclerotic calcifications are present within the cavernous internal carotid arteries bilaterally. Asymmetric hyperdensity is present in the left MCA. Skull: Calvarium is intact. No focal lytic or blastic lesions are present. Sinuses/Orbits: Chronic left maxillary sinus disease is present. The remaining paranasal sinuses and mastoid air cells are clear. Globes and orbits are within normal limits. ASPECTS [facility] Stroke Program Early CT Score) - Ganglionic level infarction (caudate, lentiform nuclei, internal capsule, insula, M1-M3 cortex): 7/7 - Supraganglionic infarction (M4-M6 cortex): 3/3 Total score (0-10 with 10 being normal): 10/10 IMPRESSION: 1. Stable atrophy and white matter disease. 2. Stable remote lacunar infarcts of the cerebellum. 3. Question hyperdense left MCA. This does not correspond with the patient's left-sided defect, but could be related to speech abnormalities. 4. ASPECTS is 10/10 The above was relayed via text pager to Dr. Rosalin Hawking on 09/29/2017 at 15:39 . Electronically Signed   By: San Morelle M.D.   On: 09/29/2017 15:40    EKG: Independently reviewed.  Afib with rate 72; LVH, IVCD, nonspecific ST changes with no evidence of acute ischemia   Labs on Admission: I have personally reviewed the available labs and imaging studies at the time of the admission.  Pertinent labs:   CO2 21 Glucose 220 BUN 18/Creatinine 1.19/GFR 42 Albumin 3.4 Normal CBC INR 1.29 A1c 7.6 in 1/19 Lipids in 1/19:  164/28/103/214  Assessment/Plan Principal Problem:   TIA (transient ischemic attack) Active Problems:   Hypothyroidism   Controlled diabetes mellitus type II without complication (HCC)   HLD (hyperlipidemia)   Essential hypertension   PAF (paroxysmal atrial fibrillation) (HCC)   Chronic anticoagulation   Dementia   TIA/CVA -Patient presenting with a transient episode of slurred speech and confusion concerning for TIA/CVA -  Will place in observation status for CVA/TIA evaluation -Telemetry monitoring -MRI/MRA -Carotid dopplers -Echo -She has been taking ASA daily (despite AC) and so may benefit from initiation of Plavix -The greater concern is that she takes Eliquis only once daily most days (see below) -PT/OT/ST/Nutrition Consults  HTN -Allow permissive HTN -Treat BP only if >220/120, and then with goal of 15% reduction -Hold CCB, BB, and ARB and plan to restart in 48-72 hours  HLD -Recent FLP with LDL >70 -Will increase Lipitor from 10 to 40 mg daily for now  DM -Recent A1c 7.6, indicating imperfect but possibly reasonable control -She takes PO medications for this - Amaryl, Onglyza -Will hold PO meds -Cover with moderate-scale SSI  PAF on Eliquis -Patient with known afib -She is rate controlled on Coreg -However, she is only taking her Eliquis once daily on most days; this is unlikely to be effective -Instead, will change to [facility] - once daily and appropriate for her stage 3 CKD (hopefully this medication will not be cost-prohibitive)  Hypothyroidism -Recent normal TSH -Continue Synthroid at current dose for now  Dementia -Continue Aricept  DVT prophylaxis: PPIRJJO Code Status:  Full - confirmed with patient/family Family Communication: Daughter (NP, hospitalist) present for entire evaluation  Disposition Plan:  Home once clinically improved Consults called: Neurology; PT/OT/ST/Nutrition/CM  Admission status: It is my clinical opinion that referral for  OBSERVATION is reasonable and necessary in this patient based on the above information provided. The aforementioned taken together are felt to place the patient at high risk for further clinical deterioration. However it is anticipated that the patient may be medically stable for discharge from the hospital within 24 to 48 hours.    Karmen Bongo MD [facility]  If note is complete, please contact covering daytime or nighttime physician. [email or web address] Password [facility]  09/29/2017, 5:47 PM

## 2017-09-29 NOTE — ED Triage Notes (Signed)
Pt arrives to ED via EMS from home as a code stroke. Pt [id number] [id number] when pt daughter witnessed her suddenly become confused, slurring speech, and AMS with right sided weakness. In route to ED pt symptoms completely resolved. A&Ox 4 on arrival to ED, no slurred speech or weakness.

## 2017-09-29 NOTE — Consult Note (Signed)
Stroke Neurology Consultation Note  Consult Requested by: Dr. Tyrone Nine  Reason for Consult: confusion, right facial droop, right arm weakness  Consult Date: 09/29/17  The history was obtained from the daughter and pt.  During history and examination, all items were able to obtain unless otherwise noted.  History of Present Illness:  Lindsay Nguyen is a 82 y.o. Caucasian female with PMH of atrial for ablation on Eliquis, CAD status post CABG on aspirin 81, CHF, diabetes, COPD, hypertension, hyperlipidemia, cognitive impairment presented as code stroke. This morning around 810am, patient was just dressed up, trying to put her clothes on, started to be confused with clothes.  Daughter came to help her, found her not able to make sentences, however able to tell daughter's name and son's name, but slurry speech.  Also found to have right facial droop, and right-sided arm weakness.  Denies any shaking jerking, seizure-like activity, denies headache.  EMS called.  On arrival, BP 168/96, glucose 250.  Patient symptoms started to be better, arm weakness much improved, however still has slurred speech.  On arrival to ED, patient symptoms all resolved.  CT no acute abnormality.  NIHSS = 0.   As per daughter, patient is on Eliquis 5 mg twice daily, however due to memory difficulties, she not always compliant with twice daily dosing.  Her last Eliquis dose was today around 12 PM. Her sugar still not in good control, last month A1c 7.6.  She also had lipid panel last month, LDL 103.  She is on Lipitor 10 at home.  She had history of CHF, last 2D echo was in 11/2015 EF 55%.  She follows with Dr. Alvester Chou in cardiology  LSN: [id number] tPA Given: No: Symptom resolved, Eliquis this morning  Past Medical History:  Diagnosis Date  . Anemia   . Atrial fibrillation ([place])    persistent  . CAD (coronary artery disease)    2D ECHO, 11/04/2010 - EF >55%, mild-moderate mitral regurgitation, moderate tricuspid regurgitation  .  Diabetes mellitus   . GERD (gastroesophageal reflux disease)   . Hyperlipidemia   . Hypertension   . Non-STEMI (non-ST elevated myocardial infarction) ([facility])    NUCLEAR STRESS TEST, 11/04/2010 - normal, no ischemia  . Osteopenia   . Pain in limb    LEA VENOUS DUPLEX, 06/27/2010 - no evidence of DVT  . Thoracic aortic aneurysm ([facility])    status post resection and grafting  . Thyroid disease    hypothyroid    Past Surgical History:  Procedure Laterality Date  . CARDIAC CATHETERIZATION  06/18/2010   Recommended CABG  . CARDIOVERSION N/A 09/30/2015   Procedure: CARDIOVERSION;  Surgeon: [name], MD;  Location: [facility] ENDOSCOPY;  Service: Cardiovascular;  Laterality: N/A;  . CARDIOVERSION N/A 06/05/2016   Procedure: CARDIOVERSION;  Surgeon: [name], MD;  Location: [facility] ENDOSCOPY;  Service: Cardiovascular;  Laterality: N/A;  . CHOLECYSTECTOMY    . CORONARY ARTERY BYPASS GRAFT  06/18/2010   LIMA-LAD, vein-diagonal branch, vein-obtuse marginal branch, and resectioning and grafting of throacic aortic aneurysm  . ELBOW SURGERY  1979   fracture left  . TONSILLECTOMY  1945  . TUBAL LIGATION  1971    Family History  Problem Relation Age of Onset  . Hypertension Mother   . Osteoporosis Mother   . Heart disease Father   . Stroke Father   . Alzheimer's disease Father   . Heart attack Father     Social History:  reports that  has never smoked. she has  never used smokeless tobacco. She reports that she does not drink alcohol or use drugs.  Allergies:  Allergies  Allergen Reactions  . Contrast Media [Iodinated Diagnostic Agents] Anaphylaxis and Swelling  . Iodine Anaphylaxis and Swelling  . Gabapentin Hives  . Shellfish Allergy     unknown    No current facility-administered medications on file prior to encounter.    Current Outpatient Medications on File Prior to Encounter  Medication Sig Dispense Refill  . albuterol (PROVENTIL) (2.5 MG/3ML) 0.083% nebulizer solution INHALE 1  NEBULE VIA NEBULIZER Q 2 HOURS PRN FOR WHEEZING OR SHORTNESS OF BREATH  5  . amLODipine (NORVASC) 2.5 MG tablet TAKE 1 TABLET(2.5 MG) BY MOUTH DAILY 90 tablet 3  . aspirin EC 81 MG tablet Take 1 tablet (81 mg total) by mouth daily. 90 tablet 3  . atorvastatin (LIPITOR) 10 MG tablet TAKE 1 TABLET(10 MG) BY MOUTH DAILY AT 6 PM 90 tablet 1  . budesonide (PULMICORT) 0.25 MG/2ML nebulizer solution Take 0.25 mg by nebulization as directed.     . carvedilol (COREG) 12.5 MG tablet TAKE 1&1/2 TABLETS BY MOUTH EVERY MORNING AND 1 TABLET EVERY EVENING 225 tablet 3  . donepezil (ARICEPT) 10 MG tablet TAKE 1 TABLET(10 MG) BY MOUTH AT BEDTIME 90 tablet 3  . ELIQUIS 5 MG TABS tablet TAKE 1 TABLET(5 MG) BY MOUTH TWICE DAILY 180 tablet 0  . escitalopram (LEXAPRO) 10 MG tablet TAKE 1 TABLET(10 MG) BY MOUTH DAILY 90 tablet 3  . furosemide (LASIX) 40 MG tablet TAKE 1 TABLET BY MOUTH DAILY AS NEEDED FOR FLUID RETENTION 90 tablet 1  . glimepiride (AMARYL) 2 MG tablet TAKE 1 TABLET(2 MG) BY MOUTH TWICE DAILY 180 tablet 1  . loratadine (CLARITIN) 10 MG tablet Take 10 mg by mouth daily as needed for allergies.    Marland Kitchen losartan (COZAAR) 50 MG tablet Take 1 tablet (50 mg total) by mouth daily. 90 tablet 3  . metoCLOPramide (REGLAN) 5 MG tablet Take 1 tablet (5 mg total) by mouth 4 (four) times daily -  before meals and at bedtime.    . nitroGLYCERIN (NITROSTAT) 0.4 MG SL tablet Place 0.4 mg under the tongue every 5 (five) minutes as needed for chest pain (MAX 3 TABLETS).     . ONGLYZA 5 MG TABS tablet Take 1 tablet (5 mg total) by mouth daily. 90 tablet 1  . SYNTHROID 112 MCG tablet TAKE 1/2 TABLET BY MOUTH DAILY BEFORE BREAKFAST 90 tablet 1    Review of Systems: A full ROS was attempted today and was able to be performed.  Systems assessed include - Constitutional, Eyes, HENT, Respiratory, Cardiovascular, Gastrointestinal, Genitourinary, Integument/breast, Hematologic/lymphatic, Musculoskeletal, Neurological,  Behavioral/Psych, Endocrine, Allergic/Immunologic - with pertinent responses as per HPI.  Physical Examination: Temp:  [98.7 F (37.1 C)] 98.7 F (37.1 C) (02/27 1543) Pulse Rate:  [81] 81 (02/27 1530) Resp:  [21] 21 (02/27 1530) BP: (166)/(94) 166/94 (02/27 1530) SpO2:  [96 %] 96 % (02/27 1530)  General - well nourished, well developed, in no apparent distress.    Ophthalmologic - fundi not visualized due to noncooperation.    Cardiovascular - regular rate and rhythm  Mental Status -  Level of arousal and orientation to time, place, and person were intact. Language including expression, naming, repetition, comprehension was assessed and found intact. Fund of Knowledge was assessed and was intact.  Cranial Nerves II - XII - II - Vision intact OU. III, IV, VI - Extraocular movements intact. V - Facial  sensation intact bilaterally. VII - Facial movement intact bilaterally. VIII - Hearing & vestibular intact bilaterally. X - Palate elevates symmetrically. XI - Chin turning & shoulder shrug intact bilaterally. XII - Tongue protrusion intact.  Motor Strength - The patient's strength was normal in all extremities and pronator drift was absent   Motor Tone & Bulk - Muscle tone was assessed at the neck and appendages and was normal.  Bulk was normal and fasciculations were absent.   Reflexes - The patient's reflexes were normal in all extremities and she had no pathological reflexes.  Sensory - Light touch, temperature/pinprick were assessed and were normal.    Coordination - The patient had normal movements in the hands and feet with no ataxia or dysmetria.  Tremor was absent.  Gait and Station - deferred  Data Reviewed: I have personally reviewed the radiological images below and agree with the radiology interpretations.  Ct Abdomen Pelvis Wo Contrast  Result Date: 09/02/2017 CLINICAL DATA:  Nausea and vomiting, lower abdominal pain. Mid abdominal pain for 1 month. EXAM: CT  ABDOMEN AND PELVIS WITHOUT CONTRAST TECHNIQUE: Multidetector CT imaging of the abdomen and pelvis was performed following the standard protocol without IV contrast. COMPARISON:  CT abdomen dated 08/07/2010. FINDINGS: Lower chest: No acute abnormality. Hepatobiliary: No focal liver abnormality is seen. Status post cholecystectomy. No biliary dilatation. Pancreas: Unremarkable. No pancreatic ductal dilatation or surrounding inflammatory changes. Spleen: Normal in size without focal abnormality. Adrenals/Urinary Tract: Adrenal glands appear normal. Kidneys are unremarkable without mass, stone or hydronephrosis. No ureteral or bladder calculi identified. Bladder is decompressed. Stomach/Bowel: Bowel is normal in caliber. No bowel wall thickening or evidence of bowel wall inflammation appendix is not seen but there are no inflammatory changes about the cecum to suggest acute appendicitis. Vascular/Lymphatic: Aortic atherosclerosis. No enlarged abdominal or pelvic lymph nodes. Reproductive: Uterus and bilateral adnexa are unremarkable. Other: No free fluid or abscess collection. No free intraperitoneal air. Musculoskeletal: Degenerative changes throughout the slightly scoliotic thoracolumbar spine, moderate to severe in degree. No acute or suspicious osseous finding. IMPRESSION: 1. No acute findings within the abdomen or pelvis. No source for vomiting or lower abdominal pain identified. No bowel obstruction or evidence of bowel wall inflammation. No renal or ureteral calculi. No evidence of appendicitis. No free fluid. 2. Aortic atherosclerosis. Electronically Signed   By: Franki Cabot M.D.   On: 09/02/2017 01:44   Ct Head Code Stroke Wo Contrast  Result Date: 09/29/2017 CLINICAL DATA:  Code stroke. Acute onset of slurred speech and left-sided weakness. EXAM: CT HEAD WITHOUT CONTRAST TECHNIQUE: Contiguous axial images were obtained from the base of the skull through the vertex without intravenous contrast.  COMPARISON:  MRI brain 11/05/2016. FINDINGS: Brain: Moderate periventricular and subcortical white matter hypoattenuation is again seen bilaterally. Remote lacunar infarcts are present within the cerebellum. No acute infarct, hemorrhage, or mass lesion is present. The brainstem is normal. Ventricles are proportionate to the degree of atrophy. No significant extra-axial fluid collection is present. Vascular: Atherosclerotic calcifications are present within the cavernous internal carotid arteries bilaterally. Asymmetric hyperdensity is present in the left MCA. Skull: Calvarium is intact. No focal lytic or blastic lesions are present. Sinuses/Orbits: Chronic left maxillary sinus disease is present. The remaining paranasal sinuses and mastoid air cells are clear. Globes and orbits are within normal limits. ASPECTS [facility] Stroke Program Early CT Score) - Ganglionic level infarction (caudate, lentiform nuclei, internal capsule, insula, M1-M3 cortex): 7/7 - Supraganglionic infarction (M4-M6 cortex): 3/3 Total score (0-10 with 10  being normal): 10/10 IMPRESSION: 1. Stable atrophy and white matter disease. 2. Stable remote lacunar infarcts of the cerebellum. 3. Question hyperdense left MCA. This does not correspond with the patient's left-sided defect, but could be related to speech abnormalities. 4. ASPECTS is 10/10 The above was relayed via text pager to Dr. Rosalin Hawking on 09/29/2017 at 15:39 . Electronically Signed   By: San Morelle M.D.   On: 09/29/2017 15:40    Assessment: 82 y.o. female with PMH of atrial for ablation on Eliquis, CAD status post CABG on aspirin 81, CHF, diabetes, COPD, hypertension, hyperlipidemia, cognitive impairment presented as code stroke due to confusion, aphasia, right facial droop and right arm weakness.  On arrival to ED, patient symptoms all resolved.  CT no acute abnormality.  NIHSS = 0. As per daughter, patient is not always compliant with twice daily dosing due to cognitive  impairment.  Patient condition consistent with TIA, not candidate for TPA due to symptoms resolved and Eliquis this morning.  Will admit to internal medicine, finish stroke workup with MRI, MRA head, carotid Doppler and 2D echo.  Stroke Risk Factors - atrial fibrillation, diabetes mellitus, hyperlipidemia, hypertension and CHF, CAD  Plan: - Admit to information for further stroke workup.  Appreciate help - MRI, MRA  of the brain without contrast - PT consult, OT consult, Speech consult - Echocardiogram - Carotid dopplers - Prophylactic therapy- eliquis 5mg  bid - Risk factor modification - Telemetry monitoring - Frequent neuro checks  Thank you for this consultation and allowing [place] to participate in the care of this patient.  Rosalin Hawking, MD PhD Stroke Neurology 09/29/2017 4:16 PM

## 2017-09-29 NOTE — Progress Notes (Signed)
ANTICOAGULATION CONSULT NOTE - Initial Consult  Pharmacy Consult for edoxaban Indication: atrial fibrillation  Allergies  Allergen Reactions  . Contrast Media [Iodinated Diagnostic Agents] Anaphylaxis and Swelling  . Iodine Anaphylaxis and Swelling  . Shellfish Allergy Anaphylaxis    unknown  . Gabapentin Hives    Patient Measurements: Height: 5\' 9"  (175.3 cm) Weight: 204 lb (92.5 kg) IBW/kg (Calculated) : 66.2  Vital Signs: Temp: (P) 97.9 F (36.6 C) (02/27 1816) Temp Source: (P) Oral (02/27 1816) BP: (P) 176/84 (02/27 1816) Pulse Rate: (P) 67 (02/27 1816)  Labs: Recent Labs    09/29/17 1517 09/29/17 1522  HGB 14.6 15.0  HCT 42.2 44.0  PLT 214  --   APTT 30  --   LABPROT 16.0*  --   INR 1.29  --   CREATININE 1.19* 1.00    Estimated Creatinine Clearance: 55.2 mL/min (by C-G formula based on SCr of 1 mg/dL).   Medical History: Past Medical History:  Diagnosis Date  . Anemia   . Atrial fibrillation ([place])    persistent; on Eliquis  . CAD (coronary artery disease)    2D ECHO, 11/04/2010 - EF >55%, mild-moderate mitral regurgitation, moderate tricuspid regurgitation  . Dementia   . Diabetes mellitus   . GERD (gastroesophageal reflux disease)   . Hyperlipidemia   . Hypertension   . Non-STEMI (non-ST elevated myocardial infarction) ([facility])    NUCLEAR STRESS TEST, 11/04/2010 - normal, no ischemia  . Osteopenia   . Pain in limb    LEA VENOUS DUPLEX, 06/27/2010 - no evidence of DVT  . Thoracic aortic aneurysm ([facility])    status post resection and grafting  . Thyroid disease    hypothyroid    Medications:  Scheduled:  .  stroke: mapping our early stages of recovery book   Does not apply Once  . [START ON 09/30/2017] aspirin EC  81 mg Oral Daily  . [START ON 09/30/2017] atorvastatin  40 mg Oral q1800  . donepezil  10 mg Oral QHS  . edoxaban  60 mg Oral Q24H  . [START ON 09/30/2017] escitalopram  10 mg Oral Daily  . [START ON 09/30/2017] insulin aspart  0-15 Units  Subcutaneous TID WC  . insulin aspart  0-5 Units Subcutaneous QHS  . [START ON 09/30/2017] levothyroxine  112 mcg Oral QAC breakfast  . metoCLOPramide  5 mg Oral TID AC & HS    Assessment: 56 yof came in complaining of slurred speech, previously on eliquis for hx Afib. History of mixed compliance on apixaban (takes once daily at times, LD today on 2/27 at 1200). CT head shows no acute abnormality.   Hgb 15, plts WNL. Scr 1 (CrCl ~55 mL/min) - no dose adjustment. Pharmacy consulted to change from apixaban to edoxaban - to start at the timing that next apixaban dose is due (PM dose).   Goal of Therapy:  Monitor platelets by anticoagulation protocol: Yes   Plan:  Start edoxaban 60 mg tonight at 2300 (would be timing of next apixaban dose based on twice daily prescribed dosing) Monitor renal function, CBC, and for signs/symptoms of bleeding  Doylene Canard, PharmD Clinical Pharmacist  Pager: [phone] Phone: [phone] 09/29/2017,6:41 PM

## 2017-09-29 NOTE — Progress Notes (Signed)
Pt admitted to the unit from ED; pt A&O X4 with daughter at bedside. Pt MAE x4; VSS: NIH 0; telemetry applied and verified with CCMD; NT called to second verify; pt skin intact with no pressure ulcer or opened wound noted; pt oriented to the unit and room; fall/safety precaution and prevention education completed. Bed alarm on and call light within reach. Will closely monitor pt. Delia Heady RN

## 2017-09-29 NOTE — Code Documentation (Signed)
82yo female arriving to [facility] via [place] at 11.  Patient developed slurred speech and left sided weakness at 1410 and EMS was called.  Confusion and difficulty following commands were also noted. Code stroke activated by EMS. Symptoms resolved en route to the hospital.  Of note, patient on Eliquis for atrial fibrillation, last dose this morning, however, patient frequently misses doses. Stroke team at the bedside on patient arrival.  Labs drawn and patient cleared for CT by Dr. Tyrone Nine. Patient to CT with team. CT completed. NIHSS 0, see documentation for details and code stroke times.  Patient is contraindicated for tPA d/t taking Eliquis.  TIA alert. Bedside handoff with ED RN Vikki Ports.

## 2017-09-30 ENCOUNTER — Observation Stay (HOSPITAL_BASED_OUTPATIENT_CLINIC_OR_DEPARTMENT_OTHER): Payer: Medicare Other

## 2017-09-30 ENCOUNTER — Telehealth: Payer: Self-pay | Admitting: Cardiovascular Disease

## 2017-09-30 DIAGNOSIS — I6789 Other cerebrovascular disease: Secondary | ICD-10-CM | POA: Diagnosis not present

## 2017-09-30 DIAGNOSIS — G459 Transient cerebral ischemic attack, unspecified: Secondary | ICD-10-CM

## 2017-09-30 DIAGNOSIS — F0390 Unspecified dementia without behavioral disturbance: Secondary | ICD-10-CM

## 2017-09-30 DIAGNOSIS — Z91148 Patient's other noncompliance with medication regimen for other reason: Secondary | ICD-10-CM

## 2017-09-30 DIAGNOSIS — I480 Paroxysmal atrial fibrillation: Secondary | ICD-10-CM

## 2017-09-30 DIAGNOSIS — Z7901 Long term (current) use of anticoagulants: Secondary | ICD-10-CM

## 2017-09-30 DIAGNOSIS — Z9114 Patient's other noncompliance with medication regimen: Secondary | ICD-10-CM | POA: Diagnosis not present

## 2017-09-30 LAB — GLUCOSE, CAPILLARY
GLUCOSE-CAPILLARY: 227 mg/dL — AB (ref 65–99)
GLUCOSE-CAPILLARY: 157 mg/dL — AB (ref 65–99)
GLUCOSE-CAPILLARY: 174 mg/dL — AB (ref 65–99)
Glucose-Capillary: 131 mg/dL — ABNORMAL HIGH (ref 65–99)
Glucose-Capillary: 117 mg/dL — ABNORMAL HIGH (ref 65–99)
Glucose-Capillary: 148 mg/dL — ABNORMAL HIGH (ref 65–99)

## 2017-09-30 LAB — ECHOCARDIOGRAM COMPLETE
HEIGHTINCHES: 69 in
Weight: 3264 oz

## 2017-09-30 MED ORDER — METOPROLOL SUCCINATE ER 25 MG PO TB24
25.0000 mg | ORAL_TABLET | Freq: Every day | ORAL | Status: DC
Start: 2017-09-30 — End: 2017-10-01
  Administered 2017-09-30 – 2017-10-01 (×2): 25 mg via ORAL
  Filled 2017-09-30 (×2): qty 1

## 2017-09-30 MED ORDER — AMLODIPINE BESYLATE 2.5 MG PO TABS
2.5000 mg | ORAL_TABLET | Freq: Every day | ORAL | Status: DC
  Administered 2017-09-30 – 2017-10-01 (×2): 2.5 mg via ORAL
  Filled 2017-09-30 (×2): qty 1

## 2017-09-30 MED ORDER — LOSARTAN POTASSIUM 50 MG PO TABS
50.0000 mg | ORAL_TABLET | Freq: Every day | ORAL | Status: DC
  Administered 2017-09-30 – 2017-10-01 (×2): 50 mg via ORAL
  Filled 2017-09-30 (×2): qty 1

## 2017-09-30 NOTE — Consult Note (Signed)
Cardiology Consultation:   Patient ID: Lindsay Nguyen; [id number]; [birth year]   Admit date: 09/29/2017 Date of Consult: 09/30/2017  Primary Care Provider: [name], MD Primary Cardiologist: Dr. Gwenlyn Found Primary Electrophysiologist:  None   Patient Profile:   Lindsay Nguyen is a 82 y.o. female with PMH CAD s/p CABG (2011), thoracic aneurysm graft (2011), HTN, HLD, DM, persistent atrial fibrillation (non-compliant with eliquis), dementia, and hypothyroidism who is being seen today for the evaluation of medication changes for atrial fibrillation at the request of Dr. Vega/ Patient and family.  History of Present Illness:   Lindsay Nguyen was in her usual state of health until 09/29/17 when she developed slurred speech, unsteady gait, and AMS lasting for 20-30 minutes. EMS brought patient to the ED where a code stroke was activated. Patient underwent a CT scan of her head which was negative. She was admitted to medicine for observation following TIA.   Patient states she has had low energy and interest in activities for quite some time. Her family typically sorts her pills in a pill box but she has had difficulty taking her medications twice a day. Patient has mild dementia and daughter states they have been having difficulty enforcing medication compliance at home.   From a cardiac standpoint she states she has been doing well. She saw Dr. Gwenlyn Found outpatient 01/2017 and was without complaints at that time and noted to have well controlled BP and atrial fibrillation. She was without anginal complaints at that visit. She continues to deny anginal complaints today. She has become more sedentary over the past several months. She notes some intermittent orthopnea. Denies PND, LE edema, DOE.  Hospital course: persistently hypertensive, HR well controlled. Labs notable for electrolytes wnl, Cr 1.19> 1.00, Hgb 14.6, PLT 214, Troponin negative x1. EKG Afib rate 72, LVH pattern with IVCD; non-ischemic. CT  Head stable atrophy, stable remote lacunar infarcts of the cerebellum, and ?hyperdense left MCA. MRI/MRA with no acute findings, chronic small vessel ischemic disease, and moderate-severe basilar artery stenosis. Echo with normal LVEF, no wall motion abnormalities, and mildly dilated L/R atrium.   Past Medical History:  Diagnosis Date  . Anemia   . Atrial fibrillation ([place])    persistent; on Eliquis  . CAD (coronary artery disease)    2D ECHO, 11/04/2010 - EF >55%, mild-moderate mitral regurgitation, moderate tricuspid regurgitation  . Dementia   . Diabetes mellitus   . GERD (gastroesophageal reflux disease)   . Hyperlipidemia   . Hypertension   . Non-STEMI (non-ST elevated myocardial infarction) (HCC)    NUCLEAR STRESS TEST, 11/04/2010 - normal, no ischemia  . Osteopenia   . Pain in limb    LEA VENOUS DUPLEX, 06/27/2010 - no evidence of DVT  . Thoracic aortic aneurysm (HCC)    status post resection and grafting  . Thyroid disease    hypothyroid    Past Surgical History:  Procedure Laterality Date  . CARDIAC CATHETERIZATION  06/18/2010   Recommended CABG  . CARDIOVERSION N/A 09/30/2015   Procedure: CARDIOVERSION;  Surgeon: [name], MD;  Location: [facility] ENDOSCOPY;  Service: Cardiovascular;  Laterality: N/A;  . CARDIOVERSION N/A 06/05/2016   Procedure: CARDIOVERSION;  Surgeon: [name], MD;  Location: [facility] ENDOSCOPY;  Service: Cardiovascular;  Laterality: N/A;  . CHOLECYSTECTOMY    . CORONARY ARTERY BYPASS GRAFT  06/18/2010   LIMA-LAD, vein-diagonal branch, vein-obtuse marginal branch, and resectioning and grafting of throacic aortic aneurysm  . ELBOW SURGERY  1979   fracture left  .  THORACIC AORTIC ANEURYSM REPAIR    . TONSILLECTOMY  1945  . TUBAL LIGATION  1971     Home Medications:  Prior to Admission medications   Medication Sig Start Date End Date Taking? Authorizing Provider  albuterol (PROVENTIL) (2.5 MG/3ML) 0.083% nebulizer solution INHALE 1 NEBULE VIA NEBULIZER Q  2 HOURS PRN FOR WHEEZING OR SHORTNESS OF BREATH 05/24/16  Yes [provider]  amLODipine (NORVASC) 2.5 MG tablet TAKE 1 TABLET(2.5 MG) BY MOUTH DAILY 09/03/17  Yes [name], MD  aspirin EC 81 MG tablet Take 1 tablet (81 mg total) by mouth daily. 08/07/15  Yes [name], MD  atorvastatin (LIPITOR) 10 MG tablet TAKE 1 TABLET(10 MG) BY MOUTH DAILY AT 6 PM 09/03/17  Yes [name], MD  budesonide (PULMICORT) 0.25 MG/2ML nebulizer solution Take 0.25 mg by nebulization as directed.    Yes [provider]  carvedilol (COREG) 12.5 MG tablet TAKE 1&1/2 TABLETS BY MOUTH EVERY MORNING AND 1 TABLET EVERY EVENING 06/09/17  Yes [name], MD  ELIQUIS 5 MG TABS tablet TAKE 1 TABLET(5 MG) BY MOUTH TWICE DAILY 08/08/17  Yes [name], MD  escitalopram (LEXAPRO) 10 MG tablet TAKE 1 TABLET(10 MG) BY MOUTH DAILY 09/03/17  Yes [name], MD  furosemide (LASIX) 40 MG tablet TAKE 1 TABLET BY MOUTH DAILY AS NEEDED FOR FLUID RETENTION 02/08/17  Yes [name], MD  glimepiride (AMARYL) 2 MG tablet TAKE 1 TABLET(2 MG) BY MOUTH TWICE DAILY 08/09/17  Yes [name], MD  loratadine (CLARITIN) 10 MG tablet Take 10 mg by mouth daily as needed for allergies.   Yes [provider]  losartan (COZAAR) 50 MG tablet Take 1 tablet (50 mg total) by mouth daily. 03/01/17  Yes [name], MD  metoCLOPramide (REGLAN) 5 MG tablet Take 1 tablet (5 mg total) by mouth 4 (four) times daily -  before meals and at bedtime.   Yes [name], MD  nitroGLYCERIN (NITROSTAT) 0.4 MG SL tablet Place 0.4 mg under the tongue every 5 (five) minutes as needed for chest pain (MAX 3 TABLETS).    Yes [provider]  ONGLYZA 5 MG TABS tablet Take 1 tablet (5 mg total) by mouth daily. 03/01/17  Yes [name], MD  SYNTHROID 112 MCG tablet TAKE 1/2 TABLET BY MOUTH DAILY BEFORE BREAKFAST 04/27/17  Yes [name], MD  donepezil (ARICEPT) 10 MG tablet TAKE 1  TABLET(10 MG) BY MOUTH AT BEDTIME 08/09/17   [name], MD    Inpatient Medications: Scheduled Meds: .  stroke: mapping our early stages of recovery book   Does not apply Once  . amLODipine  2.5 mg Oral Daily  . aspirin EC  81 mg Oral Daily  . atorvastatin  40 mg Oral q1800  . donepezil  10 mg Oral QHS  . edoxaban  60 mg Oral Q24H  . escitalopram  10 mg Oral Daily  . insulin aspart  0-15 Units Subcutaneous TID WC  . insulin aspart  0-5 Units Subcutaneous QHS  . levothyroxine  112 mcg Oral QAC breakfast  . losartan  50 mg Oral Daily  . metoCLOPramide  5 mg Oral TID AC & HS   Continuous Infusions: . sodium chloride     PRN Meds: acetaminophen **OR** acetaminophen (TYLENOL) oral liquid 160 mg/5 mL **OR** acetaminophen, albuterol, ondansetron (ZOFRAN) IV, senna-docusate  Allergies:    Allergies  Allergen Reactions  . Contrast Media [Iodinated  Diagnostic Agents] Anaphylaxis and Swelling  . Iodine Anaphylaxis and Swelling  . Shellfish Allergy Anaphylaxis    unknown  . Gabapentin Hives    Social History:   Social History   Socioeconomic History  . Marital status: Married    Spouse name: Not on file  . Number of children: Not on file  . Years of education: Not on file  . Highest education level: Not on file  Social Needs  . Financial resource strain: Not on file  . Food insecurity - worry: Not on file  . Food insecurity - inability: Not on file  . Transportation needs - medical: Not on file  . Transportation needs - non-medical: Not on file  Occupational History  . Not on file  Tobacco Use  . Smoking status: Never Smoker  . Smokeless tobacco: Never Used  Substance and Sexual Activity  . Alcohol use: No  . Drug use: No  . Sexual activity: Not on file  Other Topics Concern  . Not on file  Social History Narrative   Patient's daughter is a Designer, jewellery who does hospitalist work at [employer].  Her son is also in health care.    Family History:     Family History  Problem Relation Age of Onset  . Hypertension Mother   . Osteoporosis Mother   . Heart disease Father   . Stroke Father   . Alzheimer's disease Father   . Heart attack Father      ROS:  Please see the history of present illness.   All other ROS reviewed and negative.     Physical Exam/Data:   Vitals:   09/30/17 0446 09/30/17 0800 09/30/17 1243 09/30/17 1548  BP: (!) 186/84 (!) 167/94 (!) 177/71 (!) 162/75  Pulse: 72 64 71 67  Resp: 18 18 18 18   Temp: 98 F (36.7 C) 98.1 F (36.7 C) 98.1 F (36.7 C) 98.2 F (36.8 C)  TempSrc: Oral Oral Oral Oral  SpO2: 95% 93% 95% 96%  Weight:      Height:        Intake/Output Summary (Last 24 hours) at 09/30/2017 1654 Last data filed at 09/30/2017 0700 Gross per 24 hour  Intake 320 ml  Output -  Net 320 ml   Filed Weights   09/29/17 1828  Weight: 204 lb (92.5 kg)   Body mass index is 30.13 kg/m.  General:  Well nourished, well developed, in no acute distress HEENT: sclera anicteric  Neck: no JVD Vascular: No carotid bruits; distal pulses 2+ bilaterally Cardiac:  normal S1, S2; RRR; +murmur, no gallops/rubs  Lungs:  clear to auscultation bilaterally, no wheezing, rhonchi or rales  Abd: NABS, soft, nontender, no hepatomegaly Ext: no edema Musculoskeletal:  No deformities, BUE and BLE strength normal and equal Skin: warm and dry  Neuro:  CNs 2-12 intact, no focal abnormalities noted Psych:  Normal affect   EKG:  The EKG was personally reviewed and demonstrates:  EKG Afib rate 72, LVH pattern with IVCD; non-ischemic. Telemetry:  Telemetry was personally reviewed and demonstrates:  Afib with stable rate   Relevant CV Studies:  Echocardiogram 09/30/17: Study Conclusions  - Left ventricle: The cavity size was normal. Systolic function was   normal. The estimated ejection fraction was in the range of 55%   to 60%. Wall motion was normal; there were no regional wall   motion abnormalities. - Ventricular  septum: Septal motion showed paradox. These changes   are consistent with a post-thoracotomy state. -  Mitral valve: Calcified annulus. There was mild regurgitation   directed centrally. - Left atrium: The atrium was mildly dilated. - Right atrium: The atrium was mildly dilated. - Atrial septum: No defect or patent foramen ovale was identified. - Pulmonary arteries: Systolic pressure was mildly increased. PA   peak pressure: 43 mm Hg (S).  Impressions:  - The rhythm appears to be atrial fibrillation throughout the   study.  Laboratory Data:  Chemistry Recent Labs  Lab 09/29/17 1517 09/29/17 1522  NA 137 140  K 3.7 3.8  CL 103 104  CO2 21*  --   GLUCOSE 220* 223*  BUN 18 19  CREATININE 1.19* 1.00  CALCIUM 9.2  --   GFRNONAA 42*  --   GFRAA 49*  --   ANIONGAP 13  --     Recent Labs  Lab 09/29/17 1517  PROT 7.4  ALBUMIN 3.4*  AST 23  ALT 14  ALKPHOS 69  BILITOT 0.7   Hematology Recent Labs  Lab 09/29/17 1517 09/29/17 1522  WBC 9.9  --   RBC 4.42  --   HGB 14.6 15.0  HCT 42.2 44.0  MCV 95.5  --   MCH 33.0  --   MCHC 34.6  --   RDW 12.6  --   PLT 214  --    Cardiac EnzymesNo results for input(s): TROPONINI in the last 168 hours.  Recent Labs  Lab 09/29/17 1520  TROPIPOC 0.00    BNPNo results for input(s): BNP, PROBNP in the last 168 hours.  DDimer No results for input(s): DDIMER in the last 168 hours.  Radiology/Studies:  Mr Virgel Paling WU Contrast  Result Date: 09/29/2017 CLINICAL DATA:  Initial evaluation for acute episode of slurred speech, now resolved. EXAM: MRI HEAD WITHOUT CONTRAST MRA HEAD WITHOUT CONTRAST TECHNIQUE: Multiplanar, multiecho pulse sequences of the brain and surrounding structures were obtained without intravenous contrast. Angiographic images of the head were obtained using MRA technique without contrast. COMPARISON:  Prior CT from earlier the same day. FINDINGS: MRI HEAD FINDINGS Brain: Generalized age-related cerebral atrophy.  Patchy and confluent T2/FLAIR hyperintensity within the periventricular and deep white matter both cerebral hemispheres, most consistent with chronic small vessel ischemic disease, mild to moderate nature. Few small remote infarcts noted within the bilateral cerebellar hemispheres, right greater than left. Mild chronic small vessel ischemic disease noted within the pons. No abnormal foci of restricted diffusion to suggest acute or subacute ischemia. Gray-white matter differentiation maintained. No other evidence for remote infarction. No foci of susceptibility artifact to suggest acute or chronic intracranial hemorrhage. No mass lesion, midline shift or mass effect. No hydrocephalus. No extra-axial fluid collection. Major dural sinuses grossly patent. Pituitary gland suprasellar region normal. Midline structures intact and normal. Vascular: Right vertebral artery not visualized, which may be hypoplastic and/or occluded. Major intracranial vascular flow voids otherwise maintained. Skull and upper cervical spine: Craniocervical junction normal. Upper cervical spine within normal limits. Bone marrow signal intensity normal. No scalp soft tissue abnormality. Sinuses/Orbits: Globes normal soft tissues within normal limits. Chronic left maxillary sinusitis. Trace layering fluid within the right sphenoid sinus. Paranasal sinuses otherwise clear. No mastoid effusion. Inner ear structures normal. Other: None. MRA HEAD FINDINGS ANTERIOR CIRCULATION: Distal cervical segments of the internal carotid arteries are patent with antegrade flow. Petrous, cavernous, and supraclinoid segments widely patent without flow-limiting stenosis. A1 segments widely patent. Normal anterior communicating artery. Anterior cerebral arteries patent to their distal aspects without stenosis. M1 segments widely patent without stenosis. No  proximal M2 occlusion. Distal MCA branches well perfused and fairly symmetric bilaterally. POSTERIOR CIRCULATION:  Left vertebral artery dominant and is patent to the vertebrobasilar junction without stenosis. Right vertebral artery diffusely hypoplastic and demonstrates attenuated flow, but patent as well. Posterior inferior cerebral arteries patent bilaterally. Basilar artery markedly diminutive and irregular with multifocal moderate to severe stenoses. Superior cerebral arteries are patent bilaterally. Predominant fetal type origin of the PCAs bilaterally with prominent bilateral posterior communicating arteries, right larger than left. Hypoplastic P1 segments noted. PCAs well perfused to their distal aspects without stenosis. IMPRESSION: MRI HEAD IMPRESSION: 1. No acute intracranial infarct or other process identified. 2. Mild to moderate chronic small vessel ischemic disease for age, with a few small remote bilateral cerebellar infarcts. 3. Chronic left maxillary sinusitis. MRA HEAD IMPRESSION: 1. Negative intracranial MRA for large vessel occlusion. 2. Predominant fetal type origin of the PCAs bilaterally with markedly diminutive and irregular basilar artery with multifocal moderate to severe stenoses. Electronically Signed   By: Jeannine Boga M.D.   On: 09/29/2017 20:54   Mr Brain Wo Contrast  Result Date: 09/29/2017 CLINICAL DATA:  Initial evaluation for acute episode of slurred speech, now resolved. EXAM: MRI HEAD WITHOUT CONTRAST MRA HEAD WITHOUT CONTRAST TECHNIQUE: Multiplanar, multiecho pulse sequences of the brain and surrounding structures were obtained without intravenous contrast. Angiographic images of the head were obtained using MRA technique without contrast. COMPARISON:  Prior CT from earlier the same day. FINDINGS: MRI HEAD FINDINGS Brain: Generalized age-related cerebral atrophy. Patchy and confluent T2/FLAIR hyperintensity within the periventricular and deep white matter both cerebral hemispheres, most consistent with chronic small vessel ischemic disease, mild to moderate nature. Few small  remote infarcts noted within the bilateral cerebellar hemispheres, right greater than left. Mild chronic small vessel ischemic disease noted within the pons. No abnormal foci of restricted diffusion to suggest acute or subacute ischemia. Gray-white matter differentiation maintained. No other evidence for remote infarction. No foci of susceptibility artifact to suggest acute or chronic intracranial hemorrhage. No mass lesion, midline shift or mass effect. No hydrocephalus. No extra-axial fluid collection. Major dural sinuses grossly patent. Pituitary gland suprasellar region normal. Midline structures intact and normal. Vascular: Right vertebral artery not visualized, which may be hypoplastic and/or occluded. Major intracranial vascular flow voids otherwise maintained. Skull and upper cervical spine: Craniocervical junction normal. Upper cervical spine within normal limits. Bone marrow signal intensity normal. No scalp soft tissue abnormality. Sinuses/Orbits: Globes normal soft tissues within normal limits. Chronic left maxillary sinusitis. Trace layering fluid within the right sphenoid sinus. Paranasal sinuses otherwise clear. No mastoid effusion. Inner ear structures normal. Other: None. MRA HEAD FINDINGS ANTERIOR CIRCULATION: Distal cervical segments of the internal carotid arteries are patent with antegrade flow. Petrous, cavernous, and supraclinoid segments widely patent without flow-limiting stenosis. A1 segments widely patent. Normal anterior communicating artery. Anterior cerebral arteries patent to their distal aspects without stenosis. M1 segments widely patent without stenosis. No proximal M2 occlusion. Distal MCA branches well perfused and fairly symmetric bilaterally. POSTERIOR CIRCULATION: Left vertebral artery dominant and is patent to the vertebrobasilar junction without stenosis. Right vertebral artery diffusely hypoplastic and demonstrates attenuated flow, but patent as well. Posterior inferior  cerebral arteries patent bilaterally. Basilar artery markedly diminutive and irregular with multifocal moderate to severe stenoses. Superior cerebral arteries are patent bilaterally. Predominant fetal type origin of the PCAs bilaterally with prominent bilateral posterior communicating arteries, right larger than left. Hypoplastic P1 segments noted. PCAs well perfused to their distal aspects without stenosis. IMPRESSION: MRI  HEAD IMPRESSION: 1. No acute intracranial infarct or other process identified. 2. Mild to moderate chronic small vessel ischemic disease for age, with a few small remote bilateral cerebellar infarcts. 3. Chronic left maxillary sinusitis. MRA HEAD IMPRESSION: 1. Negative intracranial MRA for large vessel occlusion. 2. Predominant fetal type origin of the PCAs bilaterally with markedly diminutive and irregular basilar artery with multifocal moderate to severe stenoses. Electronically Signed   By: Jeannine Boga M.D.   On: 09/29/2017 20:54   Ct Head Code Stroke Wo Contrast  Result Date: 09/29/2017 CLINICAL DATA:  Code stroke. Acute onset of slurred speech and left-sided weakness. EXAM: CT HEAD WITHOUT CONTRAST TECHNIQUE: Contiguous axial images were obtained from the base of the skull through the vertex without intravenous contrast. COMPARISON:  MRI brain 11/05/2016. FINDINGS: Brain: Moderate periventricular and subcortical white matter hypoattenuation is again seen bilaterally. Remote lacunar infarcts are present within the cerebellum. No acute infarct, hemorrhage, or mass lesion is present. The brainstem is normal. Ventricles are proportionate to the degree of atrophy. No significant extra-axial fluid collection is present. Vascular: Atherosclerotic calcifications are present within the cavernous internal carotid arteries bilaterally. Asymmetric hyperdensity is present in the left MCA. Skull: Calvarium is intact. No focal lytic or blastic lesions are present. Sinuses/Orbits: Chronic left  maxillary sinus disease is present. The remaining paranasal sinuses and mastoid air cells are clear. Globes and orbits are within normal limits. ASPECTS [facility] Stroke Program Early CT Score) - Ganglionic level infarction (caudate, lentiform nuclei, internal capsule, insula, M1-M3 cortex): 7/7 - Supraganglionic infarction (M4-M6 cortex): 3/3 Total score (0-10 with 10 being normal): 10/10 IMPRESSION: 1. Stable atrophy and white matter disease. 2. Stable remote lacunar infarcts of the cerebellum. 3. Question hyperdense left MCA. This does not correspond with the patient's left-sided defect, but could be related to speech abnormalities. 4. ASPECTS is 10/10 The above was relayed via text pager to Dr. Rosalin Hawking on 09/29/2017 at 15:39 . Electronically Signed   By: San Morelle M.D.   On: 09/29/2017 15:40    Assessment and Plan:   1. TIA: episode of brief slurred speech, unsteady gate, and AMS. CT and MRI negative for acute findings. Likely 2/2 non-compliance with eliquis. - Continue management per primary team - Continue ASA and increased atorvastatin dose  2. Persistent atrial fibrillation: s/p multiple failed cardioversions. Atrial fibrillation without evidence of RVR on telemetry this admission.  - This patients CHA2DS2-VASc Score and unadjusted Ischemic Stroke Rate (% per year) is equal to 10.8 % stroke rate/year from a score of 8 Above score calculated as 1 point each if present [CHF, HTN, DM, Vascular=MI/PAD/Aortic Plaque, Age if 65-74, or Female] Above score calculated as 2 points each if present [Age > 75, or Stroke/TIA/TE] - Would benefit from ongoing anticoagulation - agree with consideration for once daily dosing to encourage compliance - Can use Savaysa 60mg  daily. If medication not covered by insurance or too costly, can consider xarelto as an alternative. - Agree with metoprolol XL low dose for rate control. Can trial Toprol 25mg  daily for now and titrate outpatient as tolerated.    3. CAD s/p CABG (2011): patient without anginal complaints - Continue ASA and statin  4. HTN: BP elevated.  - Will start Toprol XL 25mg  daily  - Can adjust BP meds outpatient if continues to be elevated  5. DM: Hgb A1C 7.6 08/2017. Goal <7 - Continue management per primary team  6. HLD: atorvastatin increased to 40mg  daily - Continue higher dose  For questions or updates, please contact [place] Please consult [email or web address] for contact info under Cardiology/STEMI.   Signed, Abigail Butts, PA-C  09/30/2017 4:54 PM [phone]

## 2017-09-30 NOTE — Progress Notes (Addendum)
NEUROHOSPITALISTS STROKE TEAM - DAILY PROGRESS NOTE   ADMISSION HISTORY:  Lindsay Nguyen is a 82 y.o. Caucasian female with PMH of atrial for ablation on Eliquis, CAD status post CABG on aspirin 81, CHF, diabetes, COPD, hypertension, hyperlipidemia, cognitive impairment presented as code stroke. This morning around 810am, patient was just dressed up, trying to put her clothes on, started to be confused with clothes.  Daughter came to help her, found her not able to make sentences, however able to tell daughter's name and son's name, but slurry speech.  Also found to have right facial droop, and right-sided arm weakness.  Denies any shaking jerking, seizure-like activity, denies headache.  EMS called.  On arrival, BP 168/96, glucose 250.  Patient symptoms started to be better, arm weakness much improved, however still has slurred speech.  On arrival to ED, patient symptoms all resolved.  CT no acute abnormality.  NIHSS = 0.   As per daughter, patient is on Eliquis 5 mg twice daily, however due to memory difficulties, she not always compliant with twice daily dosing.  Her last Eliquis dose was today around 12 PM. Her sugar still not in good control, last month A1c 7.6.  She also had lipid panel last month, LDL 103.  She is on Lipitor 10 at home.  She had history of CHF, last 2D echo was in 11/2015 EF 55%.  She follows with Dr. Alvester Chou in cardiology  LSN: [id number] tPA Given: No: Symptom resolved, Eliquis this morning  SUBJECTIVE (INTERVAL HISTORY) Daughter is at the bedside. Patient is found laying in bed in NAD. Overall she feels her condition is completely resolved. Voices no new complaints. No new events reported overnight. Patient likely to be discharged home later today   OBJECTIVE Lab Results: CBC:  Recent Labs  Lab 09/29/17 1517 09/29/17 1522  WBC 9.9  --   HGB 14.6 15.0  HCT 42.2 44.0  MCV 95.5  --   PLT 214  --    BMP: Recent Labs  Lab  09/29/17 1517 09/29/17 1522  NA 137 140  K 3.7 3.8  CL 103 104  CO2 21*  --   GLUCOSE 220* 223*  BUN 18 19  CREATININE 1.19* 1.00  CALCIUM 9.2  --    Liver Function Tests:  Recent Labs  Lab 09/29/17 1517  AST 23  ALT 14  ALKPHOS 69  BILITOT 0.7  PROT 7.4  ALBUMIN 3.4*   Coagulation Studies:  Recent Labs    09/29/17 1517  APTT 30  INR 1.29   PHYSICAL EXAM Temp:  [97.9 F (36.6 C)-98.7 F (37.1 C)] 98.1 F (36.7 C) (02/28 1243) Pulse Rate:  [64-81] 71 (02/28 1243) Resp:  [11-22] 18 (02/28 1243) BP: (147-186)/(70-94) 177/71 (02/28 1243) SpO2:  [93 %-96 %] 95 % (02/28 1243) Weight:  [92.5 kg (204 lb)] 92.5 kg (204 lb) (02/27 1828) General - well nourished, well developed, in no apparent distress.    Ophthalmologic - fundi not visualized due to noncooperation.    Cardiovascular - regular rate and rhythm  Mental Status -  Level of arousal and orientation to time, place, and person were intact. Language including expression, naming, repetition, comprehension was assessed and found intact. Fund of Knowledge was assessed and was intact.  Cranial Nerves II - XII - II - Vision intact OU. III, IV, VI - Extraocular movements intact. V - Facial sensation intact bilaterally. VII - Facial movement intact bilaterally. VIII - Hearing & vestibular intact bilaterally. X -  Palate elevates symmetrically. XI - Chin turning & shoulder shrug intact bilaterally. XII - Tongue protrusion intact.  Motor Strength - The patient's strength was normal in all extremities and pronator drift was absent   Motor Tone & Bulk - Muscle tone was assessed at the neck and appendages and was normal.  Bulk was normal and fasciculations were absent.   Reflexes - The patient's reflexes were normal in all extremities and she had no pathological reflexes.  Sensory - Light touch, temperature/pinprick were assessed and were normal.    Coordination - The patient had normal movements in the hands and  feet with no ataxia or dysmetria.  Tremor was absent.  Gait and Station - deferred  IMAGING: I have personally reviewed the radiological images below and agree with the radiology interpretations. Mr Lindsay Nguyen WU Contrast  Result Date: 09/29/2017 CLINICAL DATA:  Initial evaluation for acute episode of slurred speech, now resolved. EXAM: MRI HEAD WITHOUT CONTRAST MRA HEAD WITHOUT CONTRAST TECHNIQUE: Multiplanar, multiecho pulse sequences of the brain and surrounding structures were obtained without intravenous contrast. Angiographic images of the head were obtained using MRA technique without contrast. COMPARISON:  Prior CT from earlier the same day. FINDINGS: MRI HEAD FINDINGS Brain: Generalized age-related cerebral atrophy. Patchy and confluent T2/FLAIR hyperintensity within the periventricular and deep white matter both cerebral hemispheres, most consistent with chronic small vessel ischemic disease, mild to moderate nature. Few small remote infarcts noted within the bilateral cerebellar hemispheres, right greater than left. Mild chronic small vessel ischemic disease noted within the pons. No abnormal foci of restricted diffusion to suggest acute or subacute ischemia. Gray-white matter differentiation maintained. No other evidence for remote infarction. No foci of susceptibility artifact to suggest acute or chronic intracranial hemorrhage. No mass lesion, midline shift or mass effect. No hydrocephalus. No extra-axial fluid collection. Major dural sinuses grossly patent. Pituitary gland suprasellar region normal. Midline structures intact and normal. Vascular: Right vertebral artery not visualized, which may be hypoplastic and/or occluded. Major intracranial vascular flow voids otherwise maintained. Skull and upper cervical spine: Craniocervical junction normal. Upper cervical spine within normal limits. Bone marrow signal intensity normal. No scalp soft tissue abnormality. Sinuses/Orbits: Globes normal soft  tissues within normal limits. Chronic left maxillary sinusitis. Trace layering fluid within the right sphenoid sinus. Paranasal sinuses otherwise clear. No mastoid effusion. Inner ear structures normal. Other: None. MRA HEAD FINDINGS ANTERIOR CIRCULATION: Distal cervical segments of the internal carotid arteries are patent with antegrade flow. Petrous, cavernous, and supraclinoid segments widely patent without flow-limiting stenosis. A1 segments widely patent. Normal anterior communicating artery. Anterior cerebral arteries patent to their distal aspects without stenosis. M1 segments widely patent without stenosis. No proximal M2 occlusion. Distal MCA branches well perfused and fairly symmetric bilaterally. POSTERIOR CIRCULATION: Left vertebral artery dominant and is patent to the vertebrobasilar junction without stenosis. Right vertebral artery diffusely hypoplastic and demonstrates attenuated flow, but patent as well. Posterior inferior cerebral arteries patent bilaterally. Basilar artery markedly diminutive and irregular with multifocal moderate to severe stenoses. Superior cerebral arteries are patent bilaterally. Predominant fetal type origin of the PCAs bilaterally with prominent bilateral posterior communicating arteries, right larger than left. Hypoplastic P1 segments noted. PCAs well perfused to their distal aspects without stenosis. IMPRESSION: MRI HEAD IMPRESSION: 1. No acute intracranial infarct or other process identified. 2. Mild to moderate chronic small vessel ischemic disease for age, with a few small remote bilateral cerebellar infarcts. 3. Chronic left maxillary sinusitis. MRA HEAD IMPRESSION: 1. Negative intracranial MRA for large vessel  occlusion. 2. Predominant fetal type origin of the PCAs bilaterally with markedly diminutive and irregular basilar artery with multifocal moderate to severe stenoses. Electronically Signed   By: Jeannine Boga M.D.   On: 09/29/2017 20:54   Mr Brain Wo  Contrast  Result Date: 09/29/2017 CLINICAL DATA:  Initial evaluation for acute episode of slurred speech, now resolved. EXAM: MRI HEAD WITHOUT CONTRAST MRA HEAD WITHOUT CONTRAST TECHNIQUE: Multiplanar, multiecho pulse sequences of the brain and surrounding structures were obtained without intravenous contrast. Angiographic images of the head were obtained using MRA technique without contrast. COMPARISON:  Prior CT from earlier the same day. FINDINGS: MRI HEAD FINDINGS Brain: Generalized age-related cerebral atrophy. Patchy and confluent T2/FLAIR hyperintensity within the periventricular and deep white matter both cerebral hemispheres, most consistent with chronic small vessel ischemic disease, mild to moderate nature. Few small remote infarcts noted within the bilateral cerebellar hemispheres, right greater than left. Mild chronic small vessel ischemic disease noted within the pons. No abnormal foci of restricted diffusion to suggest acute or subacute ischemia. Gray-white matter differentiation maintained. No other evidence for remote infarction. No foci of susceptibility artifact to suggest acute or chronic intracranial hemorrhage. No mass lesion, midline shift or mass effect. No hydrocephalus. No extra-axial fluid collection. Major dural sinuses grossly patent. Pituitary gland suprasellar region normal. Midline structures intact and normal. Vascular: Right vertebral artery not visualized, which may be hypoplastic and/or occluded. Major intracranial vascular flow voids otherwise maintained. Skull and upper cervical spine: Craniocervical junction normal. Upper cervical spine within normal limits. Bone marrow signal intensity normal. No scalp soft tissue abnormality. Sinuses/Orbits: Globes normal soft tissues within normal limits. Chronic left maxillary sinusitis. Trace layering fluid within the right sphenoid sinus. Paranasal sinuses otherwise clear. No mastoid effusion. Inner ear structures normal. Other: None.  MRA HEAD FINDINGS ANTERIOR CIRCULATION: Distal cervical segments of the internal carotid arteries are patent with antegrade flow. Petrous, cavernous, and supraclinoid segments widely patent without flow-limiting stenosis. A1 segments widely patent. Normal anterior communicating artery. Anterior cerebral arteries patent to their distal aspects without stenosis. M1 segments widely patent without stenosis. No proximal M2 occlusion. Distal MCA branches well perfused and fairly symmetric bilaterally. POSTERIOR CIRCULATION: Left vertebral artery dominant and is patent to the vertebrobasilar junction without stenosis. Right vertebral artery diffusely hypoplastic and demonstrates attenuated flow, but patent as well. Posterior inferior cerebral arteries patent bilaterally. Basilar artery markedly diminutive and irregular with multifocal moderate to severe stenoses. Superior cerebral arteries are patent bilaterally. Predominant fetal type origin of the PCAs bilaterally with prominent bilateral posterior communicating arteries, right larger than left. Hypoplastic P1 segments noted. PCAs well perfused to their distal aspects without stenosis. IMPRESSION: MRI HEAD IMPRESSION: 1. No acute intracranial infarct or other process identified. 2. Mild to moderate chronic small vessel ischemic disease for age, with a few small remote bilateral cerebellar infarcts. 3. Chronic left maxillary sinusitis. MRA HEAD IMPRESSION: 1. Negative intracranial MRA for large vessel occlusion. 2. Predominant fetal type origin of the PCAs bilaterally with markedly diminutive and irregular basilar artery with multifocal moderate to severe stenoses. Electronically Signed   By: Jeannine Boga M.D.   On: 09/29/2017 20:54   Ct Head Code Stroke Wo Contrast  Result Date: 09/29/2017 CLINICAL DATA:  Code stroke. Acute onset of slurred speech and left-sided weakness. EXAM: CT HEAD WITHOUT CONTRAST TECHNIQUE: Contiguous axial images were obtained from the  base of the skull through the vertex without intravenous contrast. COMPARISON:  MRI brain 11/05/2016. FINDINGS: Brain: Moderate periventricular and subcortical white  matter hypoattenuation is again seen bilaterally. Remote lacunar infarcts are present within the cerebellum. No acute infarct, hemorrhage, or mass lesion is present. The brainstem is normal. Ventricles are proportionate to the degree of atrophy. No significant extra-axial fluid collection is present. Vascular: Atherosclerotic calcifications are present within the cavernous internal carotid arteries bilaterally. Asymmetric hyperdensity is present in the left MCA. Skull: Calvarium is intact. No focal lytic or blastic lesions are present. Sinuses/Orbits: Chronic left maxillary sinus disease is present. The remaining paranasal sinuses and mastoid air cells are clear. Globes and orbits are within normal limits. ASPECTS [facility] Stroke Program Early CT Score) - Ganglionic level infarction (caudate, lentiform nuclei, internal capsule, insula, M1-M3 cortex): 7/7 - Supraganglionic infarction (M4-M6 cortex): 3/3 Total score (0-10 with 10 being normal): 10/10 IMPRESSION: 1. Stable atrophy and white matter disease. 2. Stable remote lacunar infarcts of the cerebellum. 3. Question hyperdense left MCA. This does not correspond with the patient's left-sided defect, but could be related to speech abnormalities. 4. ASPECTS is 10/10 The above was relayed via text pager to Dr. Rosalin Hawking on 09/29/2017 at 15:39 . Electronically Signed   By: San Morelle M.D.   On: 09/29/2017 15:40    Echocardiogram:                 Completed and results PENDING  B/L Carotid U/S: 1-39% stenosis. Vertebral arteries are patent with antegrade flow.      IMPRESSION: Lindsay Nguyen is a 82 y.o. female with PMH of atrial for ablation on Eliquis, CAD status post CABG on aspirin 36, CHF, diabetes, COPD, hypertension, hyperlipidemia, cognitive impairment presented as code stroke  with confusion, slurred speech, right facial droop, and right-sided arm weakness. MRI reveals no acute findings. Not candidate for TPA due to symptoms resolved and Eliquis this morning.    Likely TIA:  Suspected Etiology: cardioembolic due to AFIB on Eliquis with missed doses Resultant Symptoms: confusion, slurred speech, right facial droop, and right-sided arm weakness.   Stroke Risk Factors: atrial fibrillation, diabetes mellitus, hyperlipidemia and hypertension Other Stroke Risk Factors: Advanced age, Obesity, Body mass index is 30.13 kg/m. , Hx stroke, CHF, CAD, Hypothyroidism  Outstanding Stroke Work-up Studies:    Work up is completed  PLAN  09/30/2017: Continue Aspirin/ Savaysa/ Statin Frequent neuro checks Telemetry monitoring PT/OT/SLP Ongoing aggressive stroke risk factor management Patient counseled to be compliant with her antithrombotic medications Patient counseled on Lifestyle modifications including, Diet, Exercise, and Stress Follow up with [facility] in 6 weeks  HX OF STROKES: Remote lacunar infarcts in the cerebellum  AFIB, CHRONIC: Continue Savaysa  HYPERTENSION: Stable, some elevated B/P's noted overnight Long term BP goal normotensive. May slowly restart home B/P medications  Home Meds: Multiple  HYPERLIPIDEMIA:    Component Value Date/Time   CHOL 164 08/05/2017 1245   TRIG 214 (H) 08/05/2017 1245   HDL 28 (L) 08/05/2017 1245   CHOLHDL 5.9 (H) 08/05/2017 1245   VLDL 32 (H) 09/16/2016 0849   LDLCALC 63 09/16/2016 0849  Home Meds:  Lipitor 10 mg LDL  goal < 70 Increased to  Lipitor to 40 mg daily Continue statin at discharge  DIABETES: Lab Results  Component Value Date   HGBA1C 7.6 (H) 08/05/2017  HgbA1c goal < 7.0 Currently on: Novolog Continue CBG monitoring and SSI to maintain glucose 140-180 mg/dl DM education   OBESITY Obesity, Body mass index is 30.13 kg/m. Greater than/equal to 30  Other Active  Problems: Principal Problem:   TIA (  transient ischemic attack) Active Problems:   Hypothyroidism   Controlled diabetes mellitus type II without complication (HCC)   HLD (hyperlipidemia)   Essential hypertension   PAF (paroxysmal atrial fibrillation) (HCC)   Chronic anticoagulation   Dementia    Hospital day # 0 VTE prophylaxis: SCD's  Diet : Fall precautions Diet heart healthy/carb modified Room service appropriate? Yes; Fluid consistency: Thin   FAMILY UPDATES: family at bedside  TEAM UPDATES: Velvet Bathe, MD STATUS:     Prior Home Stroke Medications:  aspirin 81 mg daily  Discharge Stroke Meds:  Please discharge patient on aspirin 81 mg daily and Savaysa   Disposition: 01-Home or Self Care Therapy Recs:               PENDING Follow Up:  Follow-up Information    Dennie Bible, NP. Schedule an appointment as soon as possible for a visit in 6 week(s).   Specialty:  Family Medicine Contact information: [address] [place] [address] [phone]          [name], MD -PCP Follow up in 1-2 weeks      Assessment & plan discussed with with attending physician and they are in agreement.    Mary Sella, ANP-C Stroke Neurology Team 09/30/2017 12:58 PM   09/30/2017 ATTENDING ASSESSMENT:   I reviewed above note and agree with the assessment and plan. I have made any additions or clarifications directly to the above note. Pt was seen and examined.   82 y.o. female with PMH of atrial for ablation on Eliquis, CAD status post CABG on aspirin 81, CHF, diabetes, COPD, hypertension, hyperlipidemia, cognitive impairment admitted for transient confusion, aphasia, right facial droop and right arm weakness.  On arrival to ED, patient symptoms all resolved.  CT no acute abnormality.  NIHSS = 0. Pt may miss eliquis dose at home due to cognitive impairment.  MRI no acute infarct, MRA no LVO, CUS unremarkable. TTE done and pending report. LDL 103  and A1C 7.6 in 08/2017.  Patient condition consistent with TIA. eliquis changed to edoxaban for better compliance. lipitor increased to 40 for HLD and stroke prevention. Neurology will sign off. Please call with questions. Pt will follow up with stroke clinic NP at [facility] in about 4 weeks. Thanks for the consult.  Rosalin Hawking, MD PhD Stroke Neurology 09/30/2017 1:20 PM     Neurology to sign-off at this time. Please call with any further questions or concerns. Thank you for this consultation.  To contact Stroke Continuity provider, please refer to [email or web address] After hours, contact General Neurology

## 2017-09-30 NOTE — Telephone Encounter (Signed)
Spoke with pt dtr, the patient is currently in the hospital and has had a TIA. They are wanting to make sure we were aware of the medication change. The patient has only been taking the carvedilol once daily so they are changing her to toprol. For the same reason they are wanting to switch the eliquis to savaysa. She just wanted to let dr berry know. Explained to the patient dtr that if she would like [place] involved in her care to ask the attending to consult [place] in the hospital and we would be glad to help with her care. She voiced understanding but wants me to forward this to dr berry.

## 2017-09-30 NOTE — Progress Notes (Signed)
PT Cancellation Note  Patient Details Name: Lindsay Nguyen MRN: [id number] DOB: April 01, 1936   Cancelled Treatment:    Reason Eval/Treat Not Completed: Patient at procedure or test/unavailable  Per daughter pt left room 15 minutes ago for carotid dopplers. will visit patient this afternoon.  Reinaldo Berber, PT, DPT Acute Rehab Services Pager: [phone]     Reinaldo Berber 09/30/2017, 9:54 AM

## 2017-09-30 NOTE — Progress Notes (Signed)
Initial Nutrition Assessment  DOCUMENTATION CODES:   Not applicable  INTERVENTION:  Cater to patient preferences, monitor for needs  NUTRITION DIAGNOSIS:   Inadequate oral intake related to decreased appetite as evidenced by per patient/family report, percent weight loss.  GOAL:   Patient will meet greater than or equal to 90% of their needs  MONITOR:   PO intake, I & O's, Labs, Weight trends, Skin  REASON FOR ASSESSMENT:   Consult Other (Comment)(CVA)  ASSESSMENT:   82 y.o. female with medical history significant of hypothyroidism; AAA s/p grafting; CAD; HTN; HLD; DM; and afib on Eliquis (once daily at best) presenting after she developed slurred speech, was a bit unsteady on her feet  Spoke with Ms. Mansel at bedside. She reports her appetite has slowly decreased over the past few years with a 7 pound insignificant weight loss over 3 weeks. Ate 75% of lunch, had [place] with gravy, broccoli and mashed potatoes. Complains of some early satiety recently. Normal intake of:  Breakfast - Bacon & Eggs, or Waffles and Eggs, occassionally with some fruit, cereal, or toast. Sometimes skips breakfast. Lunch -  Salad with lots of vegetables, hardboiled eggs, and crackers Dinner -Mostly vegetables and starch - doesn't eat much meat Snack - Says she eats ice cream almost every night  Weight has been otherwise stable over the past 1.5 years. No current supplement needs  Labs reviewed:  CBGs 157, 117, 131  Medications reviewed and include: Insulin, Reglan NS at 6mL/hr     NUTRITION - FOCUSED PHYSICAL EXAM:    Most Recent Value  Orbital Region  No depletion  Upper Arm Region  No depletion  Thoracic and Lumbar Region  No depletion  Buccal Region  No depletion  Temple Region  No depletion  Clavicle Bone Region  No depletion  Clavicle and Acromion Bone Region  No depletion  Scapular Bone Region  No depletion  Dorsal Hand  No depletion  Patellar Region  No depletion  Anterior  Thigh Region  No depletion  Posterior Calf Region  No depletion  Edema (RD Assessment)  None  Hair  Reviewed  Eyes  Reviewed  Mouth  Reviewed  Skin  Reviewed  Nails  Reviewed       Diet Order:  Fall precautions Diet heart healthy/carb modified Room service appropriate? Yes; Fluid consistency: Thin  EDUCATION NEEDS:   Education needs have been addressed  Skin:  Skin Assessment: Reviewed RN Assessment  Last BM:  09/30/2017  Height:   Ht Readings from Last 1 Encounters:  09/29/17 5\' 9"  (1.753 m)    Weight:   Wt Readings from Last 1 Encounters:  09/29/17 204 lb (92.5 kg)    Ideal Body Weight:  65.9 kg  BMI:  Body mass index is 30.13 kg/m.  Estimated Nutritional Needs:   Kcal:  1764-1911 calories (MSJ x1.2-1.3)  Protein:  93-111 grams  Fluid:  1.75-1.9L  Satira Anis. Lonisha Bobby, MS, RD LDN Inpatient Clinical Dietitian Pager [phone]

## 2017-09-30 NOTE — Progress Notes (Signed)
PROGRESS NOTE    Lindsay Nguyen  VZC:[id number] DOB: 01/10/1936 DOA: 09/29/2017 PCP: [name], MD    Brief Narrative:  82 y.o. female with medical history significant of hypothyroidism; AAA s/p grafting; CAD; HTN; HLD; DM; and afib on Eliquis (once daily at best) presenting after she developed slurred speech, was a bit unsteady on her feet  Pt was hospitalized for TIA work up. Given proposed changes in medications for management of patient's Atrial fibrillation patient and her daughter have requested Cardiology consult.   Assessment & Plan:   Principal Problem:   TIA (transient ischemic attack) - work up negative but echocardiogram pending - most likely   Active Problems:   Hypothyroidism   Controlled diabetes mellitus type II without complication (HCC) - Pt is on SSI    HLD (hyperlipidemia) - Pt on lipitor 40 mg po daily    Essential hypertension - slowly restarting BP rx's as per Neurology recommendations - started low dose amlodipine and ARB    PAF (paroxysmal atrial fibrillation) ([place]) - patient and daughter would like cardiologist to be in charge of changing medications to once a day dosing. I have consulted cardiology per their request.    Chronic anticoagulation - Currently on Savaysa, will defer to cardiology moving forward per their preference    Dementia - most likely contributing to compliance issues.  - Aricept  DVT prophylaxis: Savaysa Code Status: Full Family Communication: spoke with daughter Disposition Plan: d/c most likely next am after PT/OT evaluation and once cleared by cardiology   Consultants:   Neurology  cardiology   Procedures: echo pending   Antimicrobials: none   Subjective: Pt has no new complaints, no acute issues overnight.   Objective: Vitals:   09/30/17 0246 09/30/17 0446 09/30/17 0800 09/30/17 1243  BP: (!) 156/80 (!) 186/84 (!) 167/94 (!) 177/71  Pulse: 78 72 64 71  Resp: 18 18 18 18   Temp: 98.1 F (36.7  C) 98 F (36.7 C) 98.1 F (36.7 C) 98.1 F (36.7 C)  TempSrc: Oral Oral Oral Oral  SpO2: 93% 95% 93% 95%  Weight:      Height:        Intake/Output Summary (Last 24 hours) at 09/30/2017 1418 Last data filed at 09/30/2017 0700 Gross per 24 hour  Intake 320 ml  Output -  Net 320 ml   Filed Weights   09/29/17 1828  Weight: 92.5 kg (204 lb)    Examination:  General exam: Appears calm and comfortable, in nad.  Respiratory system: speaking in full sentences, no increased wob Cardiovascular system: no cyanosis Gastrointestinal system: Abdomen is nondistended, soft and nontender. No organomegaly or masses felt. Normal bowel sounds heard. Central nervous system: Answers questions appropriately, no facial asymmetry Extremities: no cyanosis Skin: No rashes, lesions or ulcers, on limited exam. Psychiatry:  Mood & affect appropriate.   Data Reviewed: I have personally reviewed following labs and imaging studies  CBC: Recent Labs  Lab 09/29/17 1517 09/29/17 1522  WBC 9.9  --   NEUTROABS 6.6  --   HGB 14.6 15.0  HCT 42.2 44.0  MCV 95.5  --   PLT 214  --    Basic Metabolic Panel: Recent Labs  Lab 09/29/17 1517 09/29/17 1522  NA 137 140  K 3.7 3.8  CL 103 104  CO2 21*  --   GLUCOSE 220* 223*  BUN 18 19  CREATININE 1.19* 1.00  CALCIUM 9.2  --    GFR: Estimated Creatinine Clearance: 55.2 mL/min (  by C-G formula based on SCr of 1 mg/dL). Liver Function Tests: Recent Labs  Lab 09/29/17 1517  AST 23  ALT 14  ALKPHOS 69  BILITOT 0.7  PROT 7.4  ALBUMIN 3.4*   No results for input(s): LIPASE, AMYLASE in the last 168 hours. No results for input(s): AMMONIA in the last 168 hours. Coagulation Profile: Recent Labs  Lab 09/29/17 1517  INR 1.29   Cardiac Enzymes: No results for input(s): CKTOTAL, CKMB, CKMBINDEX, TROPONINI in the last 168 hours. BNP (last 3 results) No results for input(s): PROBNP in the last 8760 hours. HbA1C: No results for input(s): HGBA1C in  the last 72 hours. CBG: Recent Labs  Lab 09/29/17 1516 09/29/17 2139 09/30/17 0614 09/30/17 0727 09/30/17 0922  GLUCAP 227* 125* 131* 117* 157*   Lipid Profile: No results for input(s): CHOL, HDL, LDLCALC, TRIG, CHOLHDL, LDLDIRECT in the last 72 hours. Thyroid Function Tests: No results for input(s): TSH, T4TOTAL, FREET4, T3FREE, THYROIDAB in the last 72 hours. Anemia Panel: No results for input(s): VITAMINB12, FOLATE, FERRITIN, TIBC, IRON, RETICCTPCT in the last 72 hours. Sepsis Labs: No results for input(s): PROCALCITON, LATICACIDVEN in the last 168 hours.  No results found for this or any previous visit (from the past 240 hour(s)).       Radiology Studies: Mr Virgel Paling TK Contrast  Result Date: 09/29/2017 CLINICAL DATA:  Initial evaluation for acute episode of slurred speech, now resolved. EXAM: MRI HEAD WITHOUT CONTRAST MRA HEAD WITHOUT CONTRAST TECHNIQUE: Multiplanar, multiecho pulse sequences of the brain and surrounding structures were obtained without intravenous contrast. Angiographic images of the head were obtained using MRA technique without contrast. COMPARISON:  Prior CT from earlier the same day. FINDINGS: MRI HEAD FINDINGS Brain: Generalized age-related cerebral atrophy. Patchy and confluent T2/FLAIR hyperintensity within the periventricular and deep white matter both cerebral hemispheres, most consistent with chronic small vessel ischemic disease, mild to moderate nature. Few small remote infarcts noted within the bilateral cerebellar hemispheres, right greater than left. Mild chronic small vessel ischemic disease noted within the pons. No abnormal foci of restricted diffusion to suggest acute or subacute ischemia. Gray-white matter differentiation maintained. No other evidence for remote infarction. No foci of susceptibility artifact to suggest acute or chronic intracranial hemorrhage. No mass lesion, midline shift or mass effect. No hydrocephalus. No extra-axial fluid  collection. Major dural sinuses grossly patent. Pituitary gland suprasellar region normal. Midline structures intact and normal. Vascular: Right vertebral artery not visualized, which may be hypoplastic and/or occluded. Major intracranial vascular flow voids otherwise maintained. Skull and upper cervical spine: Craniocervical junction normal. Upper cervical spine within normal limits. Bone marrow signal intensity normal. No scalp soft tissue abnormality. Sinuses/Orbits: Globes normal soft tissues within normal limits. Chronic left maxillary sinusitis. Trace layering fluid within the right sphenoid sinus. Paranasal sinuses otherwise clear. No mastoid effusion. Inner ear structures normal. Other: None. MRA HEAD FINDINGS ANTERIOR CIRCULATION: Distal cervical segments of the internal carotid arteries are patent with antegrade flow. Petrous, cavernous, and supraclinoid segments widely patent without flow-limiting stenosis. A1 segments widely patent. Normal anterior communicating artery. Anterior cerebral arteries patent to their distal aspects without stenosis. M1 segments widely patent without stenosis. No proximal M2 occlusion. Distal MCA branches well perfused and fairly symmetric bilaterally. POSTERIOR CIRCULATION: Left vertebral artery dominant and is patent to the vertebrobasilar junction without stenosis. Right vertebral artery diffusely hypoplastic and demonstrates attenuated flow, but patent as well. Posterior inferior cerebral arteries patent bilaterally. Basilar artery markedly diminutive and irregular with multifocal moderate  to severe stenoses. Superior cerebral arteries are patent bilaterally. Predominant fetal type origin of the PCAs bilaterally with prominent bilateral posterior communicating arteries, right larger than left. Hypoplastic P1 segments noted. PCAs well perfused to their distal aspects without stenosis. IMPRESSION: MRI HEAD IMPRESSION: 1. No acute intracranial infarct or other process  identified. 2. Mild to moderate chronic small vessel ischemic disease for age, with a few small remote bilateral cerebellar infarcts. 3. Chronic left maxillary sinusitis. MRA HEAD IMPRESSION: 1. Negative intracranial MRA for large vessel occlusion. 2. Predominant fetal type origin of the PCAs bilaterally with markedly diminutive and irregular basilar artery with multifocal moderate to severe stenoses. Electronically Signed   By: Jeannine Boga M.D.   On: 09/29/2017 20:54   Mr Brain Wo Contrast  Result Date: 09/29/2017 CLINICAL DATA:  Initial evaluation for acute episode of slurred speech, now resolved. EXAM: MRI HEAD WITHOUT CONTRAST MRA HEAD WITHOUT CONTRAST TECHNIQUE: Multiplanar, multiecho pulse sequences of the brain and surrounding structures were obtained without intravenous contrast. Angiographic images of the head were obtained using MRA technique without contrast. COMPARISON:  Prior CT from earlier the same day. FINDINGS: MRI HEAD FINDINGS Brain: Generalized age-related cerebral atrophy. Patchy and confluent T2/FLAIR hyperintensity within the periventricular and deep white matter both cerebral hemispheres, most consistent with chronic small vessel ischemic disease, mild to moderate nature. Few small remote infarcts noted within the bilateral cerebellar hemispheres, right greater than left. Mild chronic small vessel ischemic disease noted within the pons. No abnormal foci of restricted diffusion to suggest acute or subacute ischemia. Gray-white matter differentiation maintained. No other evidence for remote infarction. No foci of susceptibility artifact to suggest acute or chronic intracranial hemorrhage. No mass lesion, midline shift or mass effect. No hydrocephalus. No extra-axial fluid collection. Major dural sinuses grossly patent. Pituitary gland suprasellar region normal. Midline structures intact and normal. Vascular: Right vertebral artery not visualized, which may be hypoplastic and/or  occluded. Major intracranial vascular flow voids otherwise maintained. Skull and upper cervical spine: Craniocervical junction normal. Upper cervical spine within normal limits. Bone marrow signal intensity normal. No scalp soft tissue abnormality. Sinuses/Orbits: Globes normal soft tissues within normal limits. Chronic left maxillary sinusitis. Trace layering fluid within the right sphenoid sinus. Paranasal sinuses otherwise clear. No mastoid effusion. Inner ear structures normal. Other: None. MRA HEAD FINDINGS ANTERIOR CIRCULATION: Distal cervical segments of the internal carotid arteries are patent with antegrade flow. Petrous, cavernous, and supraclinoid segments widely patent without flow-limiting stenosis. A1 segments widely patent. Normal anterior communicating artery. Anterior cerebral arteries patent to their distal aspects without stenosis. M1 segments widely patent without stenosis. No proximal M2 occlusion. Distal MCA branches well perfused and fairly symmetric bilaterally. POSTERIOR CIRCULATION: Left vertebral artery dominant and is patent to the vertebrobasilar junction without stenosis. Right vertebral artery diffusely hypoplastic and demonstrates attenuated flow, but patent as well. Posterior inferior cerebral arteries patent bilaterally. Basilar artery markedly diminutive and irregular with multifocal moderate to severe stenoses. Superior cerebral arteries are patent bilaterally. Predominant fetal type origin of the PCAs bilaterally with prominent bilateral posterior communicating arteries, right larger than left. Hypoplastic P1 segments noted. PCAs well perfused to their distal aspects without stenosis. IMPRESSION: MRI HEAD IMPRESSION: 1. No acute intracranial infarct or other process identified. 2. Mild to moderate chronic small vessel ischemic disease for age, with a few small remote bilateral cerebellar infarcts. 3. Chronic left maxillary sinusitis. MRA HEAD IMPRESSION: 1. Negative intracranial  MRA for large vessel occlusion. 2. Predominant fetal type origin of the PCAs bilaterally  with markedly diminutive and irregular basilar artery with multifocal moderate to severe stenoses. Electronically Signed   By: Jeannine Boga M.D.   On: 09/29/2017 20:54   Ct Head Code Stroke Wo Contrast  Result Date: 09/29/2017 CLINICAL DATA:  Code stroke. Acute onset of slurred speech and left-sided weakness. EXAM: CT HEAD WITHOUT CONTRAST TECHNIQUE: Contiguous axial images were obtained from the base of the skull through the vertex without intravenous contrast. COMPARISON:  MRI brain 11/05/2016. FINDINGS: Brain: Moderate periventricular and subcortical white matter hypoattenuation is again seen bilaterally. Remote lacunar infarcts are present within the cerebellum. No acute infarct, hemorrhage, or mass lesion is present. The brainstem is normal. Ventricles are proportionate to the degree of atrophy. No significant extra-axial fluid collection is present. Vascular: Atherosclerotic calcifications are present within the cavernous internal carotid arteries bilaterally. Asymmetric hyperdensity is present in the left MCA. Skull: Calvarium is intact. No focal lytic or blastic lesions are present. Sinuses/Orbits: Chronic left maxillary sinus disease is present. The remaining paranasal sinuses and mastoid air cells are clear. Globes and orbits are within normal limits. ASPECTS [facility] Stroke Program Early CT Score) - Ganglionic level infarction (caudate, lentiform nuclei, internal capsule, insula, M1-M3 cortex): 7/7 - Supraganglionic infarction (M4-M6 cortex): 3/3 Total score (0-10 with 10 being normal): 10/10 IMPRESSION: 1. Stable atrophy and white matter disease. 2. Stable remote lacunar infarcts of the cerebellum. 3. Question hyperdense left MCA. This does not correspond with the patient's left-sided defect, but could be related to speech abnormalities. 4. ASPECTS is 10/10 The above was relayed via text pager to Dr.  Rosalin Hawking on 09/29/2017 at 15:39 . Electronically Signed   By: San Morelle M.D.   On: 09/29/2017 15:40    Scheduled Meds: .  stroke: mapping our early stages of recovery book   Does not apply Once  . amLODipine  2.5 mg Oral Daily  . aspirin EC  81 mg Oral Daily  . atorvastatin  40 mg Oral q1800  . donepezil  10 mg Oral QHS  . edoxaban  60 mg Oral Q24H  . escitalopram  10 mg Oral Daily  . insulin aspart  0-15 Units Subcutaneous TID WC  . insulin aspart  0-5 Units Subcutaneous QHS  . levothyroxine  112 mcg Oral QAC breakfast  . losartan  50 mg Oral Daily  . metoCLOPramide  5 mg Oral TID AC & HS   Continuous Infusions: . sodium chloride       LOS: 0 days    Time spent: > 35 minutes  Velvet Bathe, MD [facility] Pager [phone]  If 7PM-7AM, please contact night-coverage [email or web address] Password [facility] 09/30/2017, 2:18 PM

## 2017-09-30 NOTE — Evaluation (Addendum)
Physical Therapy Evaluation Patient Details Name: Lindsay Nguyen MRN: [id number] DOB: February 01, 1936 Today's Date: 09/30/2017   History of Present Illness   Lindsay Nguyen is a 82 y.o. Caucasian female with PMH of atrial for ablation on Eliquis, CAD status post CABG on aspirin 81, CHF, diabetes, COPD, hypertension, hyperlipidemia, cognitive impairment presented as code stroke. This morning around 810am, patient was just dressed up, trying to put her clothes on, started to be confused with clothes.  Daughter came to help her, found her not able to make sentences, however able to tell daughter's name and son's name, but slurry speech.  Also found to have right facial droop, and right-sided arm weakness.  Denies any shaking jerking, seizure-like activity, denies headache.  EMS called.  On arrival, BP 168/96, glucose 250.  Patient symptoms started to be better, arm weakness much improved, however still has slurred speech.  On arrival to ED, patient symptoms all resolved.  CT no acute abnormality.  NIHSS = 0   Clinical Impression  Pt admitted with above diagnosis. Pt currently with functional limitations due to the deficits listed below (see PT Problem List). PTA, pt independent with mobility living with family with stairs to enter home. Upon eval, patient presents with deconditioning and balance and cognitive deficits. Patient is currently min guard ambulating the unit without AD, including stairs, however with numerous path deviations into objects and wall during moderate conversation or dual tasking. Pt presents as falls risk without supervision. No family present to determine level of support but will confirm in tomorrow AM's session. Recommending HHPT to ensure safety at home and introduce balance training to reduce risk of falls.  Pt will benefit from skilled PT to increase their independence and safety with mobility to allow discharge to the venue listed below.       Follow Up Recommendations Home health  PT;Supervision for mobility/OOB    Equipment Recommendations  None recommended by PT    Recommendations for Other Services       Precautions / Restrictions Precautions Precautions: Fall Restrictions Weight Bearing Restrictions: No      Mobility  Bed Mobility               General bed mobility comments: OOB at entry  Transfers Overall transfer level: Needs assistance   Transfers: Sit to/from Stand Sit to Stand: Min guard;Supervision         General transfer comment: supervision for safety  Ambulation/Gait Ambulation/Gait assistance: Min guard;Supervision Ambulation Distance (Feet): 500 Feet Assistive device: None Gait Pattern/deviations: Staggering right;Drifts right/left Gait velocity: decreased   General Gait Details: Patient can ambulate without physical assistance or AD. however balance deficits noted, espcailly with dual tasking or distracted. Both speech and quality of gait mechanics, velocity, and balance diminished during conversation or duel tasking.   Stairs Stairs: Yes Stairs assistance: Min guard Stair Management: One rail Right;One rail Left;Alternating pattern;Step to pattern Number of Stairs: 24 General stair comments: patient performed stairs with alternating pattern and min guard, cued for step to pattern for added safety. no family present to educate on proper guarding. will re-visit tomorrow when present  Wheelchair Mobility    Modified Rankin (Stroke Patients Only) Modified Rankin (Stroke Patients Only) Pre-Morbid Rankin Score: No significant disability Modified Rankin: No significant disability     Balance Overall balance assessment: Needs assistance   Sitting balance-Leahy Scale: Good       Standing balance-Leahy Scale: Fair Standing balance comment: sway w/ EC romberg, unable to tolerate tandem stance  eyes opened.                              Pertinent Vitals/Pain      Home Living Family/patient expects to  be discharged to:: Private residence Living Arrangements: Children Available Help at Discharge: Available PRN/intermittently;Family Type of Home: House Home Access: Stairs to enter Entrance Stairs-Rails: Can reach both Entrance Stairs-Number of Steps: 4-8  Home Layout: One level        Prior Function Level of Independence: Independent         Comments: daughter assisted with some ADLs     Hand Dominance        Extremity/Trunk Assessment   Upper Extremity Assessment Upper Extremity Assessment: Defer to OT evaluation    Lower Extremity Assessment Lower Extremity Assessment: (BLE strength 4/5)    Cervical / Trunk Assessment Cervical / Trunk Assessment: Normal  Communication   Communication: No difficulties  Cognition Arousal/Alertness: Awake/alert Behavior During Therapy: WFL for tasks assessed/performed Overall Cognitive Status: No family/caregiver present to determine baseline cognitive functioning                                 General Comments: mild dementia, pt AOx4 and appropriate in conversation.       General Comments      Exercises     Assessment/Plan    PT Assessment Patient needs continued PT services  PT Problem List Decreased strength;Decreased activity tolerance;Decreased coordination;Decreased mobility;Decreased cognition;Decreased safety awareness;Decreased balance       PT Treatment Interventions DME instruction;Gait training;Stair training;Functional mobility training;Therapeutic activities;Therapeutic exercise;Balance training    PT Goals (Current goals can be found in the Care Plan section)  Acute Rehab PT Goals Patient Stated Goal: return home PT Goal Formulation: With patient Time For Goal Achievement: 10/07/17 Potential to Achieve Goals: Good    Frequency Min 4X/week   Barriers to discharge        Co-evaluation               AM-PAC PT "6 Clicks" Daily Activity  Outcome Measure Difficulty turning  over in bed (including adjusting bedclothes, sheets and blankets)?: A Little Difficulty moving from lying on back to sitting on the side of the bed? : A Little Difficulty sitting down on and standing up from a chair with arms (e.g., wheelchair, bedside commode, etc,.)?: A Little Help needed moving to and from a bed to chair (including a wheelchair)?: A Little Help needed walking in hospital room?: A Little Help needed climbing 3-5 steps with a railing? : A Little 6 Click Score: 18    End of Session Equipment Utilized During Treatment: Gait belt Activity Tolerance: Patient tolerated treatment well Patient left: in bed Nurse Communication: Mobility status PT Visit Diagnosis: Unsteadiness on feet (R26.81);Other abnormalities of gait and mobility (R26.89)    Time: 6734-1937 PT Time Calculation (min) (ACUTE ONLY): 38 min   Charges:   PT Evaluation $PT Eval Low Complexity: 1 Low PT Treatments $Gait Training: 8-22 mins   PT G Codes:      Reinaldo Berber, PT, DPT Acute Rehab Services Pager: [phone]    Reinaldo Berber 09/30/2017, 10:06 PM

## 2017-09-30 NOTE — Progress Notes (Signed)
CM consulted for benefits check:  Lindsay Nguyen @ [employer] [place]  # [phone] OPT- 1    1.EDOXABAN 60 MG PO DAILY   COVER- NOT COVER  PRIOR APPROVAL- YES # [phone] OPT- 5    2. SAVAYSA - NONE FORMULARY

## 2017-09-30 NOTE — Telephone Encounter (Signed)
New message  Pt daughter verbalized that she is calling for the RN  Pt is in the hospital in too, [place] [address] and the Dr.Vega   Want to change her medications please call   She had and Echo done and a TIA

## 2017-09-30 NOTE — Progress Notes (Signed)
Preliminary notes by tech--Carotid duplex completed. Bilateral carotid 1-39% stenosis. Vertebral arteries are patent with antegrade flow.  Lindsay Nguyen(RMDS RVT)

## 2017-09-30 NOTE — Progress Notes (Signed)
  Echocardiogram 2D Echocardiogram has been performed.  Patient states she is unable to be administered definity.   Lindsay Nguyen 09/30/2017, 11:13 AM

## 2017-10-01 ENCOUNTER — Telehealth: Payer: Self-pay | Admitting: *Deleted

## 2017-10-01 DIAGNOSIS — G459 Transient cerebral ischemic attack, unspecified: Secondary | ICD-10-CM | POA: Diagnosis not present

## 2017-10-01 LAB — GLUCOSE, CAPILLARY
Glucose-Capillary: 124 mg/dL — ABNORMAL HIGH (ref 65–99)
Glucose-Capillary: 221 mg/dL — ABNORMAL HIGH (ref 65–99)

## 2017-10-01 MED ORDER — RIVAROXABAN 20 MG PO TABS: 20.0000 mg | ORAL_TABLET | Freq: Every day | ORAL | 0 refills | Status: DC

## 2017-10-01 MED ORDER — LEVOTHYROXINE SODIUM 112 MCG PO TABS: 56.0000 ug | ORAL_TABLET | Freq: Every day | ORAL | Status: DC

## 2017-10-01 MED ORDER — RIVAROXABAN 20 MG PO TABS: 20.0000 mg | ORAL_TABLET | Freq: Once | ORAL | Status: DC

## 2017-10-01 MED ORDER — RIVAROXABAN 20 MG PO TABS: 20.0000 mg | ORAL_TABLET | Freq: Every day | ORAL | Status: DC

## 2017-10-01 MED ORDER — METOPROLOL SUCCINATE ER 25 MG PO TB24: 25.0000 mg | ORAL_TABLET | Freq: Every day | ORAL | 0 refills | Status: DC

## 2017-10-01 MED ORDER — ATORVASTATIN CALCIUM 40 MG PO TABS: 40.0000 mg | ORAL_TABLET | Freq: Every day | ORAL | 0 refills | Status: DC

## 2017-10-01 NOTE — Progress Notes (Signed)
OT Evaluation  - late entry  PTA, pt living at home with her son , who is gone in the evenings at times. Pt's daughter, who is a NP, comes by in the evenings as needed. Pt demonstrates apparent cognitive deficits, impacting safety at times. Will follow up tomorrow. Feel pt and family would benefit from [facility] to further assess safety and management of tasks within pt's familiar home environment.    09/30/17 2100  OT Visit Information  Last OT Received On 09/30/17  Assistance Needed +1  History of Present Illness Lindsay Nguyen is a 82 y.o. Caucasian female with PMH of atrial for ablation on Eliquis, CAD status post CABG on aspirin 81, CHF, diabetes, COPD, hypertension, hyperlipidemia, cognitive impairment presented as code stroke. This morning around 810am, patient was just dressed up, trying to put her clothes on, started to be confused with clothes.  Daughter came to help her, found her not able to make sentences, however able to tell daughter's name and son's name, but slurry speech.  Also found to have right facial droop, and right-sided arm weakness.  Denies any shaking jerking, seizure-like activity, denies headache.  EMS called.  On arrival, BP 168/96, glucose 250.  Patient symptoms started to be better, arm weakness much improved, however still has slurred speech.  On arrival to ED, patient symptoms all resolved.  CT no acute abnormality.  NIHSS =   Precautions  Precautions Fall  Restrictions  Weight Bearing Restrictions No  Home Living  Family/patient expects to be discharged to: Private residence  Living Arrangements Children  Available Help at Discharge Available PRN/intermittently;Family  Type of [place] to enter  Entrance Stairs-Number of Steps 4-8   Entrance Stairs-Rails Can reach both  Home Layout One level  Bathroom Biomedical scientist Yes  How Accessible Accessible via walker  Home Equipment None   Prior Function  Level of Independence Independent  Comments daughter assisted with some IADL tasks and medication management  Communication  Communication No difficulties  Pain Assessment  Pain Assessment No/denies pain  Cognition  Arousal/Alertness Awake/alert  Behavior During Therapy WFL for tasks assessed/performed  Overall Cognitive Status Impaired/Different from baseline  Area of Impairment Attention;Memory;Safety/judgement;Awareness  Current Attention Level Selective  Memory Decreased short-term memory  Safety/Judgement Decreased awareness of safety;Decreased awareness of deficits  Awareness Emergent  General Comments Demonstrates poor safety awareness (trying to stand with blanket wrapped around feet); ran into wall on right when walking and talking  Upper Extremity Assessment  Upper Extremity Assessment Overall WFL for tasks assessed  Lower Extremity Assessment  Lower Extremity Assessment Defer to PT evaluation (BLE strength 4/5)  Cervical / Trunk Assessment  Cervical / Trunk Assessment Normal  ADL  Overall ADL's  Needs assistance/impaired  Functional mobility during ADLs Supervision/safety  General ADL Comments Able to complete basic ADL tasks with set up and supervision; discussed IADL tasks, including medicaiton management while daughter was in the room - pt provided some accurate and inaccurate information  Vision- History  Baseline Vision/History Wears glasses  Vision- Assessment  Additional Comments will further assess; pt reports no visual changes  Perception  Comments daughter reports difficulty with clock copy on previous test by MD; not poor spatial awareness at times  Praxis  Praxis-Other Comments appears intact  Bed Mobility  General bed mobility comments OOB at entry  Transfers  Overall transfer level Needs assistance  Transfers Sit to/from Stand  Sit to Stand Supervision  General transfer comment supervision for safety  Balance  Overall balance  assessment Needs assistance  Sitting balance-Leahy Scale Good  Standing balance-Leahy Scale Fair  OT - End of Session  Equipment Utilized During Treatment Gait belt  Activity Tolerance Patient tolerated treatment well  Patient left in bed;with call bell/phone within reach;with bed alarm set  Nurse Communication Mobility status  OT Assessment  OT Recommendation/Assessment Patient needs continued OT Services  OT Visit Diagnosis Unsteadiness on feet (R26.81);Other symptoms and signs involving cognitive function  OT Problem List Decreased cognition;Decreased safety awareness;Impaired balance (sitting and/or standing)  OT Plan  OT Frequency (ACUTE ONLY) Min 2X/week  OT Treatment/Interventions (ACUTE ONLY) Self-care/ADL training;Therapeutic activities;Cognitive remediation/compensation;Visual/perceptual remediation/compensation;Patient/family education;Balance training  AM-PAC OT "6 Clicks" Daily Activity Outcome Measure  Help from another person eating meals? 4  Help from another person taking care of personal grooming? 4  Help from another person toileting, which includes using toliet, bedpan, or urinal? 3  Help from another person bathing (including washing, rinsing, drying)? 3  Help from another person to put on and taking off regular upper body clothing? 4  Help from another person to put on and taking off regular lower body clothing? 4  6 Click Score 22  ADL G Code Conversion CJ  OT Recommendation  Follow Up Recommendations Home health OT;Supervision - Intermittent  OT Equipment Tub/shower seat  Individuals Consulted  Consulted and Agree with Results and Recommendations Patient;Family member/caregiver  Family Member Consulted daughter  Acute Rehab OT Goals  Patient Stated Goal return home  OT Goal Formulation With patient  Time For Goal Achievement 10/15/17  Potential to Achieve Goals Good  OT Time Calculation  OT Start Time (ACUTE ONLY) 1434  OT Stop Time (ACUTE ONLY) 1450  OT  Time Calculation (min) 16 min  OT General Charges  $OT Visit 1 Visit  Written Expression  Dominant Hand Right  [facility], OT/L  [phone] 09/30/2017

## 2017-10-01 NOTE — Progress Notes (Signed)
[place] for xarelto Indication: atrial fibrillation  Allergies  Allergen Reactions  . Contrast Media [Iodinated Diagnostic Agents] Anaphylaxis and Swelling  . Iodine Anaphylaxis and Swelling  . Shellfish Allergy Anaphylaxis    unknown  . Gabapentin Hives    Patient Measurements: Height: 5\' 9"  (175.3 cm) Weight: 204 lb (92.5 kg) IBW/kg (Calculated) : 66.2  Vital Signs: Temp: 97.8 F (36.6 C) (03/01 0831) Temp Source: Oral (03/01 0831) BP: 169/70 (03/01 0831) Pulse Rate: 65 (03/01 0831)  Labs: Recent Labs    09/29/17 1517 09/29/17 1522  HGB 14.6 15.0  HCT 42.2 44.0  PLT 214  --   APTT 30  --   LABPROT 16.0*  --   INR 1.29  --   CREATININE 1.19* 1.00    Estimated Creatinine Clearance: 55.2 mL/min (by C-G formula based on SCr of 1 mg/dL).   Medical History: Past Medical History:  Diagnosis Date  . Anemia   . Atrial fibrillation ([place])    persistent; on Eliquis  . CAD (coronary artery disease)    2D ECHO, 11/04/2010 - EF >55%, mild-moderate mitral regurgitation, moderate tricuspid regurgitation  . Dementia   . Diabetes mellitus   . GERD (gastroesophageal reflux disease)   . Hyperlipidemia   . Hypertension   . Non-STEMI (non-ST elevated myocardial infarction) (H[facility])    NUCLEAR STRESS TEST, 11/04/2010 - normal, no ischemia  . Osteopenia   . Pain in limb    LEA VENOUS DUPLEX, 06/27/2010 - no evidence of DVT  . Thoracic aortic aneurysm (HCC)    status post resection and grafting  . Thyroid disease    hypothyroid    Medications:  Scheduled:  .  stroke: mapping our early stages of recovery book   Does not apply Once  . amLODipine  2.5 mg Oral Daily  . aspirin EC  81 mg Oral Daily  . atorvastatin  40 mg Oral q1800  . donepezil  10 mg Oral QHS  . escitalopram  10 mg Oral Daily  . insulin aspart  0-15 Units Subcutaneous TID WC  . insulin aspart  0-5 Units Subcutaneous QHS  . levothyroxine  112 mcg Oral QAC breakfast  .  losartan  50 mg Oral Daily  . metoCLOPramide  5 mg Oral TID AC & HS  . metoprolol succinate  25 mg Oral Daily    Assessment: 30 yof came in complaining of slurred speech, previously on eliquis for hx Afib. History of mixed compliance on apixaban (takes once daily at times, LD today on 2/27 at 1200). CT head shows no acute abnormality.  Patient was changed to edoxaban but plans are for Xarelto due to cost concerns. -last dose of edoxaban was 2/29 at 10pm -CrCl ~ 60  Goal of Therapy:  Monitor platelets by anticoagulation protocol: Yes   Plan:  -Start Xarelto 20mg  po daily tonight -Will provide patient education  Hildred Laser, PharmD Clinical Pharmacist Clinical phone from 8:30-4:00 is [phone] After 4pm, please call Main [facility] ([phone]) for assistance. 10/01/2017 11:34 AM

## 2017-10-01 NOTE — Discharge Instructions (Signed)
Please keep a log of your heart rate and blood pressure recordings and bring them with you to your next cardiology appointment. Call the office if your blood pressure is consistently with SBP (top number) >180 or heart rate is consistently >120  ---------------------------- Information on my medicine - XARELTO (Rivaroxaban)  This medication education was reviewed with me or my healthcare representative as part of my discharge preparation.    Why was Xarelto prescribed for you? Xarelto was prescribed for you to reduce the risk of a blood clot forming that can cause a stroke if you have a medical condition called atrial fibrillation (a type of irregular heartbeat).  What do you need to know about xarelto ? Take your Xarelto ONCE DAILY at the same time every day with your evening meal. If you have difficulty swallowing the tablet whole, you may crush it and mix in applesauce just prior to taking your dose.  Take Xarelto exactly as prescribed by your doctor and DO NOT stop taking Xarelto without talking to the doctor who prescribed the medication.  Stopping without other stroke prevention medication to take the place of Xarelto may increase your risk of developing a clot that causes a stroke.  Refill your prescription before you run out.  After discharge, you should have regular check-up appointments with your healthcare provider that is prescribing your Xarelto.  In the future your dose may need to be changed if your kidney function or weight changes by a significant amount.  What do you do if you miss a dose? If you are taking Xarelto ONCE DAILY and you miss a dose, take it as soon as you remember on the same day then continue your regularly scheduled once daily regimen the next day. Do not take two doses of Xarelto at the same time or on the same day.   Important Safety Information A possible side effect of Xarelto is bleeding. You should call your healthcare provider right away if  you experience any of the following: ? Bleeding from an injury or your nose that does not stop. ? Unusual colored urine (red or dark brown) or unusual colored stools (red or black). ? Unusual bruising for unknown reasons. ? A serious fall or if you hit your head (even if there is no bleeding).  Some medicines may interact with Xarelto and might increase your risk of bleeding while on Xarelto. To help avoid this, consult your healthcare provider or pharmacist prior to using any new prescription or non-prescription medications, including herbals, vitamins, non-steroidal anti-inflammatory drugs (NSAIDs) and supplements.  This website has more information on Xarelto: [email or web address]

## 2017-10-01 NOTE — Discharge Summary (Signed)
Physician Discharge Summary  [place] HBZ:[id number] DOB: [birth year] DOA: 09/29/2017  PCP: [name], MD  Admit date: 09/29/2017 Discharge date: 10/01/2017  Time spent: > 35 minutes  Recommendations for Outpatient Follow-up:  1. Ensure patient f/u with neurology and cardiology   Discharge Diagnoses:  Principal Problem:   TIA (transient ischemic attack) Active Problems:   Hypothyroidism   Controlled diabetes mellitus type II without complication (HCC)   HLD (hyperlipidemia)   Essential hypertension   PAF (paroxysmal atrial fibrillation) (HCC)   Chronic anticoagulation   Dementia   Medication noncompliance due to cognitive impairment   Discharge Condition: stable  Diet recommendation: Heart healthy diet  Filed Weights   09/29/17 1828  Weight: 92.5 kg (204 lb)    History of present illness:  82 y.o. female with medical history significant of hypothyroidism; AAA s/p grafting; CAD; HTN; HLD; DM; and afib on Eliquis (once daily at best) presenting after she developed slurred speech, was a bit unsteady on her feet, perhaps became confused  Patient worked up for [place] Hospital Course:  TIA - patient had full work up. Neurology consulted.  - Placed patient on lipitor 40 mg po daily - changed eliquis for xarelto - changed coreg for once a day toprol xl due to problems with compliance secondary to dementia. - PT recommended home health PT  Atrial fibrillation - Pt will be d/c on xarelto - rate control with toprol xl  Procedures:  none  Consultations:  Neurology  cardiology  Discharge Exam: Vitals:   10/01/17 0831 10/01/17 1139  BP: (!) 169/70 (!) 151/94  Pulse: 65 66  Resp: 18 18  Temp: 97.8 F (36.6 C) 97.8 F (36.6 C)  SpO2: 96% 95%    General: Pt in nad, alert and awake Cardiovascular: rrr, no rubs Respiratory: no increased wob, no wheezes  Discharge Instructions   Discharge Instructions    Ambulatory referral to Neurology   Complete  by:  As directed    An appointment is requested in approximately: 4 weeks Follow up with stroke clinic NP (Jessica Vanschaick or Cecille Rubin, if both not available, consider Zachery Dauer, or [place]) at [facility] in about 4 weeks. Thanks.   Call MD for:  severe uncontrolled pain   Complete by:  As directed    Call MD for:  temperature >100.4   Complete by:  As directed    Diet - low sodium heart healthy   Complete by:  As directed    Increase activity slowly   Complete by:  As directed      Allergies as of 10/01/2017      Reactions   Contrast Media [iodinated Diagnostic Agents] Anaphylaxis, Swelling   Iodine Anaphylaxis, Swelling   Shellfish Allergy Anaphylaxis   unknown   Gabapentin Hives      Medication List    STOP taking these medications   carvedilol 12.5 MG tablet Commonly known as:  COREG   ELIQUIS 5 MG Tabs tablet Generic drug:  apixaban     TAKE these medications   albuterol (2.5 MG/3ML) 0.083% nebulizer solution Commonly known as:  PROVENTIL INHALE 1 NEBULE VIA NEBULIZER Q 2 HOURS PRN FOR WHEEZING OR SHORTNESS OF BREATH   amLODipine 2.5 MG tablet Commonly known as:  NORVASC TAKE 1 TABLET(2.5 MG) BY MOUTH DAILY   aspirin EC 81 MG tablet Take 1 tablet (81 mg total) by mouth daily.   atorvastatin 40 MG tablet Commonly known as:  LIPITOR Take 1 tablet (40 mg total)  by mouth daily at 6 PM. What changed:    medication strength  See the new instructions.   budesonide 0.25 MG/2ML nebulizer solution Commonly known as:  PULMICORT Take 0.25 mg by nebulization as directed.   donepezil 10 MG tablet Commonly known as:  ARICEPT TAKE 1 TABLET(10 MG) BY MOUTH AT BEDTIME   escitalopram 10 MG tablet Commonly known as:  LEXAPRO TAKE 1 TABLET(10 MG) BY MOUTH DAILY   furosemide 40 MG tablet Commonly known as:  LASIX TAKE 1 TABLET BY MOUTH DAILY AS NEEDED FOR FLUID RETENTION   glimepiride 2 MG tablet Commonly known as:  AMARYL TAKE 1 TABLET(2 MG) BY MOUTH TWICE  DAILY   loratadine 10 MG tablet Commonly known as:  CLARITIN Take 10 mg by mouth daily as needed for allergies.   losartan 50 MG tablet Commonly known as:  COZAAR Take 1 tablet (50 mg total) by mouth daily.   metoprolol succinate 25 MG 24 hr tablet Commonly known as:  TOPROL-XL Take 1 tablet (25 mg total) by mouth daily. Start taking on:  10/02/2017   nitroGLYCERIN 0.4 MG SL tablet Commonly known as:  NITROSTAT Place 0.4 mg under the tongue every 5 (five) minutes as needed for chest pain (MAX 3 TABLETS).   ONGLYZA 5 MG Tabs tablet Generic drug:  saxagliptin HCl Take 1 tablet (5 mg total) by mouth daily.   REGLAN 5 MG tablet Generic drug:  metoCLOPramide Take 1 tablet (5 mg total) by mouth 4 (four) times daily -  before meals and at bedtime.   rivaroxaban 20 MG Tabs tablet Commonly known as:  XARELTO Take 1 tablet (20 mg total) by mouth daily with supper. Start taking on:  10/02/2017   SYNTHROID 112 MCG tablet Generic drug:  levothyroxine TAKE 1/2 TABLET BY MOUTH DAILY BEFORE BREAKFAST      Allergies  Allergen Reactions  . Contrast Media [Iodinated Diagnostic Agents] Anaphylaxis and Swelling  . Iodine Anaphylaxis and Swelling  . Shellfish Allergy Anaphylaxis    unknown  . Gabapentin Hives   Follow-up Information    Venancio Poisson, NP. Schedule an appointment as soon as possible for a visit in 4 week(s).   Specialty:  Nurse Practitioner Contact information: [address] [place] [address] [phone]        [name], MD Follow up.   Specialties:  Cardiology, Radiology Why:  Dr. Kennon Holter office will contact you directly with an appointment date and time. Will try to schedule you to be seen within 2-3 weeks with either Dr. Gwenlyn Found of an advanced practice provider on his team.  Contact information: [address] [place] [address] [phone]            The results of significant diagnostics from this hospitalization  (including imaging, microbiology, ancillary and laboratory) are listed below for reference.    Significant Diagnostic Studies: Ct Abdomen Pelvis Wo Contrast  Result Date: 09/02/2017 CLINICAL DATA:  Nausea and vomiting, lower abdominal pain. Mid abdominal pain for 1 month. EXAM: CT ABDOMEN AND PELVIS WITHOUT CONTRAST TECHNIQUE: Multidetector CT imaging of the abdomen and pelvis was performed following the standard protocol without IV contrast. COMPARISON:  CT abdomen dated 08/07/2010. FINDINGS: Lower chest: No acute abnormality. Hepatobiliary: No focal liver abnormality is seen. Status post cholecystectomy. No biliary dilatation. Pancreas: Unremarkable. No pancreatic ductal dilatation or surrounding inflammatory changes. Spleen: Normal in size without focal abnormality. Adrenals/Urinary Tract: Adrenal glands appear normal. Kidneys are unremarkable without mass, stone or hydronephrosis. No  ureteral or bladder calculi identified. Bladder is decompressed. Stomach/Bowel: Bowel is normal in caliber. No bowel wall thickening or evidence of bowel wall inflammation appendix is not seen but there are no inflammatory changes about the cecum to suggest acute appendicitis. Vascular/Lymphatic: Aortic atherosclerosis. No enlarged abdominal or pelvic lymph nodes. Reproductive: Uterus and bilateral adnexa are unremarkable. Other: No free fluid or abscess collection. No free intraperitoneal air. Musculoskeletal: Degenerative changes throughout the slightly scoliotic thoracolumbar spine, moderate to severe in degree. No acute or suspicious osseous finding. IMPRESSION: 1. No acute findings within the abdomen or pelvis. No source for vomiting or lower abdominal pain identified. No bowel obstruction or evidence of bowel wall inflammation. No renal or ureteral calculi. No evidence of appendicitis. No free fluid. 2. Aortic atherosclerosis. Electronically Signed   By: Franki Cabot M.D.   On: 09/02/2017 01:44   Mr Jodene Nam Head Wo  Contrast  Result Date: 09/29/2017 CLINICAL DATA:  Initial evaluation for acute episode of slurred speech, now resolved. EXAM: MRI HEAD WITHOUT CONTRAST MRA HEAD WITHOUT CONTRAST TECHNIQUE: Multiplanar, multiecho pulse sequences of the brain and surrounding structures were obtained without intravenous contrast. Angiographic images of the head were obtained using MRA technique without contrast. COMPARISON:  Prior CT from earlier the same day. FINDINGS: MRI HEAD FINDINGS Brain: Generalized age-related cerebral atrophy. Patchy and confluent T2/FLAIR hyperintensity within the periventricular and deep white matter both cerebral hemispheres, most consistent with chronic small vessel ischemic disease, mild to moderate nature. Few small remote infarcts noted within the bilateral cerebellar hemispheres, right greater than left. Mild chronic small vessel ischemic disease noted within the pons. No abnormal foci of restricted diffusion to suggest acute or subacute ischemia. Gray-white matter differentiation maintained. No other evidence for remote infarction. No foci of susceptibility artifact to suggest acute or chronic intracranial hemorrhage. No mass lesion, midline shift or mass effect. No hydrocephalus. No extra-axial fluid collection. Major dural sinuses grossly patent. Pituitary gland suprasellar region normal. Midline structures intact and normal. Vascular: Right vertebral artery not visualized, which may be hypoplastic and/or occluded. Major intracranial vascular flow voids otherwise maintained. Skull and upper cervical spine: Craniocervical junction normal. Upper cervical spine within normal limits. Bone marrow signal intensity normal. No scalp soft tissue abnormality. Sinuses/Orbits: Globes normal soft tissues within normal limits. Chronic left maxillary sinusitis. Trace layering fluid within the right sphenoid sinus. Paranasal sinuses otherwise clear. No mastoid effusion. Inner ear structures normal. Other: None.  MRA HEAD FINDINGS ANTERIOR CIRCULATION: Distal cervical segments of the internal carotid arteries are patent with antegrade flow. Petrous, cavernous, and supraclinoid segments widely patent without flow-limiting stenosis. A1 segments widely patent. Normal anterior communicating artery. Anterior cerebral arteries patent to their distal aspects without stenosis. M1 segments widely patent without stenosis. No proximal M2 occlusion. Distal MCA branches well perfused and fairly symmetric bilaterally. POSTERIOR CIRCULATION: Left vertebral artery dominant and is patent to the vertebrobasilar junction without stenosis. Right vertebral artery diffusely hypoplastic and demonstrates attenuated flow, but patent as well. Posterior inferior cerebral arteries patent bilaterally. Basilar artery markedly diminutive and irregular with multifocal moderate to severe stenoses. Superior cerebral arteries are patent bilaterally. Predominant fetal type origin of the PCAs bilaterally with prominent bilateral posterior communicating arteries, right larger than left. Hypoplastic P1 segments noted. PCAs well perfused to their distal aspects without stenosis. IMPRESSION: MRI HEAD IMPRESSION: 1. No acute intracranial infarct or other process identified. 2. Mild to moderate chronic small vessel ischemic disease for age, with a few small remote bilateral cerebellar infarcts. 3. Chronic left  maxillary sinusitis. MRA HEAD IMPRESSION: 1. Negative intracranial MRA for large vessel occlusion. 2. Predominant fetal type origin of the PCAs bilaterally with markedly diminutive and irregular basilar artery with multifocal moderate to severe stenoses. Electronically Signed   By: Jeannine Boga M.D.   On: 09/29/2017 20:54   Mr Brain Wo Contrast  Result Date: 09/29/2017 CLINICAL DATA:  Initial evaluation for acute episode of slurred speech, now resolved. EXAM: MRI HEAD WITHOUT CONTRAST MRA HEAD WITHOUT CONTRAST TECHNIQUE: Multiplanar, multiecho pulse  sequences of the brain and surrounding structures were obtained without intravenous contrast. Angiographic images of the head were obtained using MRA technique without contrast. COMPARISON:  Prior CT from earlier the same day. FINDINGS: MRI HEAD FINDINGS Brain: Generalized age-related cerebral atrophy. Patchy and confluent T2/FLAIR hyperintensity within the periventricular and deep white matter both cerebral hemispheres, most consistent with chronic small vessel ischemic disease, mild to moderate nature. Few small remote infarcts noted within the bilateral cerebellar hemispheres, right greater than left. Mild chronic small vessel ischemic disease noted within the pons. No abnormal foci of restricted diffusion to suggest acute or subacute ischemia. Gray-white matter differentiation maintained. No other evidence for remote infarction. No foci of susceptibility artifact to suggest acute or chronic intracranial hemorrhage. No mass lesion, midline shift or mass effect. No hydrocephalus. No extra-axial fluid collection. Major dural sinuses grossly patent. Pituitary gland suprasellar region normal. Midline structures intact and normal. Vascular: Right vertebral artery not visualized, which may be hypoplastic and/or occluded. Major intracranial vascular flow voids otherwise maintained. Skull and upper cervical spine: Craniocervical junction normal. Upper cervical spine within normal limits. Bone marrow signal intensity normal. No scalp soft tissue abnormality. Sinuses/Orbits: Globes normal soft tissues within normal limits. Chronic left maxillary sinusitis. Trace layering fluid within the right sphenoid sinus. Paranasal sinuses otherwise clear. No mastoid effusion. Inner ear structures normal. Other: None. MRA HEAD FINDINGS ANTERIOR CIRCULATION: Distal cervical segments of the internal carotid arteries are patent with antegrade flow. Petrous, cavernous, and supraclinoid segments widely patent without flow-limiting stenosis.  A1 segments widely patent. Normal anterior communicating artery. Anterior cerebral arteries patent to their distal aspects without stenosis. M1 segments widely patent without stenosis. No proximal M2 occlusion. Distal MCA branches well perfused and fairly symmetric bilaterally. POSTERIOR CIRCULATION: Left vertebral artery dominant and is patent to the vertebrobasilar junction without stenosis. Right vertebral artery diffusely hypoplastic and demonstrates attenuated flow, but patent as well. Posterior inferior cerebral arteries patent bilaterally. Basilar artery markedly diminutive and irregular with multifocal moderate to severe stenoses. Superior cerebral arteries are patent bilaterally. Predominant fetal type origin of the PCAs bilaterally with prominent bilateral posterior communicating arteries, right larger than left. Hypoplastic P1 segments noted. PCAs well perfused to their distal aspects without stenosis. IMPRESSION: MRI HEAD IMPRESSION: 1. No acute intracranial infarct or other process identified. 2. Mild to moderate chronic small vessel ischemic disease for age, with a few small remote bilateral cerebellar infarcts. 3. Chronic left maxillary sinusitis. MRA HEAD IMPRESSION: 1. Negative intracranial MRA for large vessel occlusion. 2. Predominant fetal type origin of the PCAs bilaterally with markedly diminutive and irregular basilar artery with multifocal moderate to severe stenoses. Electronically Signed   By: Jeannine Boga M.D.   On: 09/29/2017 20:54   Ct Head Code Stroke Wo Contrast  Result Date: 09/29/2017 CLINICAL DATA:  Code stroke. Acute onset of slurred speech and left-sided weakness. EXAM: CT HEAD WITHOUT CONTRAST TECHNIQUE: Contiguous axial images were obtained from the base of the skull through the vertex without intravenous contrast. COMPARISON:  MRI brain 11/05/2016. FINDINGS: Brain: Moderate periventricular and subcortical white matter hypoattenuation is again seen bilaterally.  Remote lacunar infarcts are present within the cerebellum. No acute infarct, hemorrhage, or mass lesion is present. The brainstem is normal. Ventricles are proportionate to the degree of atrophy. No significant extra-axial fluid collection is present. Vascular: Atherosclerotic calcifications are present within the cavernous internal carotid arteries bilaterally. Asymmetric hyperdensity is present in the left MCA. Skull: Calvarium is intact. No focal lytic or blastic lesions are present. Sinuses/Orbits: Chronic left maxillary sinus disease is present. The remaining paranasal sinuses and mastoid air cells are clear. Globes and orbits are within normal limits. ASPECTS [facility] Stroke Program Early CT Score) - Ganglionic level infarction (caudate, lentiform nuclei, internal capsule, insula, M1-M3 cortex): 7/7 - Supraganglionic infarction (M4-M6 cortex): 3/3 Total score (0-10 with 10 being normal): 10/10 IMPRESSION: 1. Stable atrophy and white matter disease. 2. Stable remote lacunar infarcts of the cerebellum. 3. Question hyperdense left MCA. This does not correspond with the patient's left-sided defect, but could be related to speech abnormalities. 4. ASPECTS is 10/10 The above was relayed via text pager to Dr. Rosalin Hawking on 09/29/2017 at 15:39 . Electronically Signed   By: San Morelle M.D.   On: 09/29/2017 15:40    Microbiology: No results found for this or any previous visit (from the past 240 hour(s)).   Labs: Basic Metabolic Panel: Recent Labs  Lab 09/29/17 1517 09/29/17 1522  NA 137 140  K 3.7 3.8  CL 103 104  CO2 21*  --   GLUCOSE 220* 223*  BUN 18 19  CREATININE 1.19* 1.00  CALCIUM 9.2  --    Liver Function Tests: Recent Labs  Lab 09/29/17 1517  AST 23  ALT 14  ALKPHOS 69  BILITOT 0.7  PROT 7.4  ALBUMIN 3.4*   No results for input(s): LIPASE, AMYLASE in the last 168 hours. No results for input(s): AMMONIA in the last 168 hours. CBC: Recent Labs  Lab 09/29/17 1517  09/29/17 1522  WBC 9.9  --   NEUTROABS 6.6  --   HGB 14.6 15.0  HCT 42.2 44.0  MCV 95.5  --   PLT 214  --    Cardiac Enzymes: No results for input(s): CKTOTAL, CKMB, CKMBINDEX, TROPONINI in the last 168 hours. BNP: BNP (last 3 results) No results for input(s): BNP in the last 8760 hours.  ProBNP (last 3 results) No results for input(s): PROBNP in the last 8760 hours.  CBG: Recent Labs  Lab 09/30/17 0922 09/30/17 1545 09/30/17 2239 10/01/17 0627 10/01/17 1134  GLUCAP 157* 174* 148* 124* 221*    Signed:  Velvet Bathe MD.  [facility] 10/01/2017, 1:44 PM

## 2017-10-01 NOTE — Progress Notes (Signed)
SLP Cancellation Note  Patient Details Name: Lindsay Nguyen MRN: [id number] DOB: 1936/03/30   Cancelled treatment:       Reason Eval/Treat Not Completed: Patient at procedure or test/unavailable. Will continue efforts.  Anshi Jalloh B. Quentin Ore [facility], CCC-SLP Speech Language Pathologist [phone]  Shonna Chock 10/01/2017, 11:00 AM

## 2017-10-01 NOTE — Care Management Note (Addendum)
Case Management Note  Patient Details  Name: Lindsay Nguyen MRN: [id number] Date of Birth: 24-Mar-1936  Subjective/Objective:                    Action/Plan: Pt discharging home with home health. CM provided her choice and she selected [place]. Butch Penny with [facility] notified and accepted the referral. CM provided her 30 day free card for the Xarelto. Pt has 24 hour supervision and transportation home.   Expected Discharge Date:  10/01/17               Expected Discharge Plan:  [place]  In-House Referral:     Discharge planning Services  CM Consult  Post Acute Care Choice:  Home Health Choice offered to:  Patient  DME Arranged:    DME Agency:     HH Arranged:  PT, OT HH Agency:  [facility]  Status of Service:  Completed, signed off  If discussed at [place] of Stay Meetings, dates discussed:    Additional Comments:  Pollie Friar, RN 10/01/2017, 4:01 PM

## 2017-10-01 NOTE — Telephone Encounter (Signed)
Left message for patient to call and schedule 2-3 week post hospital visit with Dr. Gwenlyn Found

## 2017-10-01 NOTE — Evaluation (Signed)
Speech Language Pathology Evaluation Patient Details Name: Lindsay Nguyen MRN: [id number] DOB: 1935/12/04 Today's Date: 10/01/2017 Time: 1245-1310 SLP Time Calculation (min) (ACUTE ONLY): 25 min  Problem List:  Patient Active Problem List   Diagnosis Date Noted  . Medication noncompliance due to cognitive impairment   . TIA (transient ischemic attack) 09/29/2017  . Dementia 09/29/2017  . History of depression 11/17/2016  . Bradycardia, sinus 10/22/2015  . Acute diastolic (congestive) heart failure ([place]) 10/22/2015  . Chronic anticoagulation 10/22/2015  . COPD with asthma ([place]) 10/22/2015  . PAF (paroxysmal atrial fibrillation) ([place]) 08/07/2015  . Metabolic disorder, iron 95/62/1308  . PVD (peripheral vascular disease)-  01/20/2013  . Bronchitis 01/20/2013  . Bronchopneumonia 04/04/2011  . Myalgia 10/26/2010  . PLEURAL EFFUSION, RIGHT 08/20/2010  . Hx of CABG 05/01/2010  . Hypothyroidism 08/20/2009  . Controlled diabetes mellitus type II without complication ([place]) 65/78/4696  . HLD (hyperlipidemia) 08/20/2009  . Essential hypertension 08/20/2009  . GERD 08/20/2009  . OSTEOPENIA 08/20/2009   Past Medical History:  Past Medical History:  Diagnosis Date  . Anemia   . Atrial fibrillation ([place])    persistent; on Eliquis  . CAD (coronary artery disease)    2D ECHO, 11/04/2010 - EF >55%, mild-moderate mitral regurgitation, moderate tricuspid regurgitation  . Dementia   . Diabetes mellitus   . GERD (gastroesophageal reflux disease)   . Hyperlipidemia   . Hypertension   . Non-STEMI (non-ST elevated myocardial infarction) (HCC)    NUCLEAR STRESS TEST, 11/04/2010 - normal, no ischemia  . Osteopenia   . Pain in limb    LEA VENOUS DUPLEX, 06/27/2010 - no evidence of DVT  . Thoracic aortic aneurysm (HCC)    status post resection and grafting  . Thyroid disease    hypothyroid   Past Surgical History:  Past Surgical History:  Procedure Laterality Date  . CARDIAC CATHETERIZATION   06/18/2010   Recommended CABG  . CARDIOVERSION N/A 09/30/2015   Procedure: CARDIOVERSION;  Surgeon: [name], MD;  Location: [facility] ENDOSCOPY;  Service: Cardiovascular;  Laterality: N/A;  . CARDIOVERSION N/A 06/05/2016   Procedure: CARDIOVERSION;  Surgeon: [name], MD;  Location: [facility] ENDOSCOPY;  Service: Cardiovascular;  Laterality: N/A;  . CHOLECYSTECTOMY    . CORONARY ARTERY BYPASS GRAFT  06/18/2010   LIMA-LAD, vein-diagonal branch, vein-obtuse marginal branch, and resectioning and grafting of throacic aortic aneurysm  . ELBOW SURGERY  1979   fracture left  . THORACIC AORTIC ANEURYSM REPAIR    . TONSILLECTOMY  1945  . TUBAL LIGATION  19797   HPI:  82 year old female admited 09/29/17 with slurred speech, lasting 30+ minutes. PMH significant for hypothyroid, AAA, CAD, HTN, HLD, DM, AFib. MRI = no acute infarct   Assessment / Plan / Recommendation Clinical Impression  The Montreal Cognitive Assessment (MoCA) was administered. Pt scored 24/30 (n=26+/30) indicating mild cognitive impairment for pt age and level of education. Points were lost on the following subtests: Executive function, immediate and delayed recall, thought organization, calculation, and visuoperception. Pt reports she does not drive, but is able to complete personal responsibilities (cooking, cleaning, bill paying). She was encouraged to notify PCP if difficulties are noted after return to normal routines, as home health or outpatient speech therapy may be beneficial.     SLP Assessment  SLP Recommendation/Assessment: All further Speech Language Pathology needs can be addressed in the next venue of care if needs arise  SLP Visit Diagnosis: Cognitive communication deficit (R41.841)    Follow Up Recommendations  None(home health or outpatient speech if needs arise.)          SLP Evaluation Cognition  Overall Cognitive Status: No family/caregiver present to determine baseline cognitive functioning Arousal/Alertness:  Awake/alert Orientation Level: Oriented X4 Attention: Focused;Sustained Focused Attention: Appears intact Sustained Attention: Appears intact Memory: Impaired Memory Impairment: Retrieval deficit;Storage deficit;Decreased recall of new information Executive Function: Organizing;Reasoning Reasoning: Impaired Reasoning Impairment: Verbal basic Organizing: Impaired Organizing Impairment: Verbal basic       Comprehension  Auditory Comprehension Overall Auditory Comprehension: Appears within functional limits for tasks assessed    Expression Expression Primary Mode of Expression: Verbal Verbal Expression Overall Verbal Expression: Appears within functional limits for tasks assessed   Oral / Motor  Oral Motor/Sensory Function Overall Oral Motor/Sensory Function: Within functional limits Motor Speech Overall Motor Speech: Appears within functional limits for tasks assessed   GO                   Raney Antwine B. [facility], CCC-SLP Speech Language Pathologist [phone]  Shonna Chock 10/01/2017, 1:18 PM

## 2017-10-01 NOTE — Progress Notes (Signed)
Discharge Note: Patient alert and oriented X 4 and in no distress.  Patient and her daughter, Santiago Glad, given discharge instructions regarding signs and symptoms to report, medications, diet, activity, and upcoming appointments.  They verbalized understanding of all instructions.  Peripheral IV and telemetry discontinued.  Patient was transported out via wheelchair by NT, MiMi.

## 2017-10-01 NOTE — Progress Notes (Signed)
Physical Therapy Treatment and Discharge Patient Details Name: Lindsay Nguyen MRN: [id number] DOB: 21-Jan-1936 Today's Date: 10/01/2017    History of Present Illness  Lindsay Nguyen is a 82 y.o. Caucasian female with PMH of atrial for ablation on Eliquis, CAD status post CABG on aspirin 81, CHF, diabetes, COPD, hypertension, hyperlipidemia, cognitive impairment presented as code stroke. This morning around 810am, patient was just dressed up, trying to put her clothes on, started to be confused with clothes.  Daughter came to help her, found her not able to make sentences, however able to tell daughter's name and son's name, but slurry speech.  Also found to have right facial droop, and right-sided arm weakness.  Denies any shaking jerking, seizure-like activity, denies headache.  EMS called.  On arrival, BP 168/96, glucose 250.  Patient symptoms started to be better, arm weakness much improved, however still has slurred speech.  On arrival to ED, patient symptoms all resolved.  CT no acute abnormality.  NIHSS =     PT Comments    Session focused heavily on family education discussion over safety considerations for return to home. Patient continues to walk without AD however with diminished mechanics and safety with dual cogntive tasking, as well as head turns/vestibular input. Daughter supportive and looks forward to working with home therapies to maximize patients safety at home going forward. Patient d/c this afternoon, all education and training complete at this time.      Follow Up Recommendations  Home health PT;Supervision for mobility/OOB     Equipment Recommendations  None recommended by PT    Recommendations for Other Services       Precautions / Restrictions Precautions Precautions: Fall Restrictions Weight Bearing Restrictions: No    Mobility  Bed Mobility               General bed mobility comments: OOB at entry  Transfers Overall transfer level: Needs  assistance Equipment used: None Transfers: Sit to/from Stand Sit to Stand: Min guard;Supervision         General transfer comment: supervision for safety  Ambulation/Gait Ambulation/Gait assistance: Min guard;Supervision Ambulation Distance (Feet): 500 Feet Assistive device: None Gait Pattern/deviations: Staggering right;Drifts right/left Gait velocity: decreased   General Gait Details: less staggering tonight, with dual tasking quality of gait decreases. spent much of the time speaking with dauther caregiver about deficits and things to look out for.    Stairs            Wheelchair Mobility    Modified Rankin (Stroke Patients Only) Modified Rankin (Stroke Patients Only) Pre-Morbid Rankin Score: Slight disability Modified Rankin: Slight disability     Balance Overall balance assessment: Needs assistance   Sitting balance-Leahy Scale: Good       Standing balance-Leahy Scale: Fair Standing balance comment: sway w/ EC romberg, unable to tolerate tandem stance eyes opened.                             Cognition Arousal/Alertness: Awake/alert Behavior During Therapy: WFL for tasks assessed/performed Overall Cognitive Status: History of cognitive impairments - at baseline Area of Impairment: Attention;Memory;Safety/judgement;Awareness                   Current Attention Level: Selective Memory: Decreased short-term memory   Safety/Judgement: Decreased awareness of safety;Decreased awareness of deficits Awareness: Emergent   General Comments: mild dementia, pt AOx4 and appropriate in conversation.       Exercises  General Comments General comments (skin integrity, edema, etc.): Extensive discussion with family over receomendations for home safety and vestibular and balance rehab. All questions have been answered tonight.       Pertinent Vitals/Pain Pain Assessment: No/denies pain    Home Living                       Prior Function            PT Goals (current goals can now be found in the care plan section) Acute Rehab PT Goals Patient Stated Goal: return home PT Goal Formulation: With patient/family Time For Goal Achievement: 10/07/17 Potential to Achieve Goals: Good Progress towards PT goals: Goals met/education completed, patient discharged from PT    Frequency    Min 4X/week      PT Plan Current plan remains appropriate    Co-evaluation              AM-PAC PT "6 Clicks" Daily Activity  Outcome Measure  Difficulty turning over in bed (including adjusting bedclothes, sheets and blankets)?: A Little Difficulty moving from lying on back to sitting on the side of the bed? : A Little Difficulty sitting down on and standing up from a chair with arms (e.g., wheelchair, bedside commode, etc,.)?: A Little Help needed moving to and from a bed to chair (including a wheelchair)?: A Little Help needed walking in hospital room?: A Little Help needed climbing 3-5 steps with a railing? : A Little 6 Click Score: 18    End of Session Equipment Utilized During Treatment: Gait belt Activity Tolerance: Patient tolerated treatment well Patient left: in bed Nurse Communication: Mobility status PT Visit Diagnosis: Unsteadiness on feet (R26.81);Other abnormalities of gait and mobility (R26.89)     Time: 8828-0034 PT Time Calculation (min) (ACUTE ONLY): 27 min  Charges:  $Gait Training: 8-22 mins $Self Care/Home Management: 8-22                    G Codes:       Reinaldo Berber, PT, DPT Acute Rehab Services Pager: [phone]     Reinaldo Berber 10/01/2017, 5:46 PM

## 2017-10-01 NOTE — Care Management Obs Status (Signed)
[place] NOTIFICATION   Patient Details  Name: Lindsay Nguyen MRN: [id number] Date of Birth: 1935/10/28   Medicare Observation Status Notification Given:  Yes    Pollie Friar, RN 10/01/2017, 1:13 PM

## 2017-10-06 DIAGNOSIS — E039 Hypothyroidism, unspecified: Secondary | ICD-10-CM | POA: Diagnosis not present

## 2017-10-06 DIAGNOSIS — I252 Old myocardial infarction: Secondary | ICD-10-CM | POA: Diagnosis not present

## 2017-10-06 DIAGNOSIS — F0390 Unspecified dementia without behavioral disturbance: Secondary | ICD-10-CM | POA: Diagnosis not present

## 2017-10-06 DIAGNOSIS — I10 Essential (primary) hypertension: Secondary | ICD-10-CM | POA: Diagnosis not present

## 2017-10-06 DIAGNOSIS — Z7984 Long term (current) use of oral hypoglycemic drugs: Secondary | ICD-10-CM | POA: Diagnosis not present

## 2017-10-06 DIAGNOSIS — I480 Paroxysmal atrial fibrillation: Secondary | ICD-10-CM | POA: Diagnosis not present

## 2017-10-06 DIAGNOSIS — M8580 Other specified disorders of bone density and structure, unspecified site: Secondary | ICD-10-CM | POA: Diagnosis not present

## 2017-10-06 DIAGNOSIS — R2689 Other abnormalities of gait and mobility: Secondary | ICD-10-CM | POA: Diagnosis not present

## 2017-10-06 DIAGNOSIS — Z951 Presence of aortocoronary bypass graft: Secondary | ICD-10-CM | POA: Diagnosis not present

## 2017-10-06 DIAGNOSIS — E785 Hyperlipidemia, unspecified: Secondary | ICD-10-CM | POA: Diagnosis not present

## 2017-10-06 DIAGNOSIS — I2510 Atherosclerotic heart disease of native coronary artery without angina pectoris: Secondary | ICD-10-CM | POA: Diagnosis not present

## 2017-10-06 DIAGNOSIS — Z8673 Personal history of transient ischemic attack (TIA), and cerebral infarction without residual deficits: Secondary | ICD-10-CM | POA: Diagnosis not present

## 2017-10-06 DIAGNOSIS — E119 Type 2 diabetes mellitus without complications: Secondary | ICD-10-CM | POA: Diagnosis not present

## 2017-10-12 DIAGNOSIS — I639 Cerebral infarction, unspecified: Secondary | ICD-10-CM | POA: Diagnosis not present

## 2017-10-12 DIAGNOSIS — I252 Old myocardial infarction: Secondary | ICD-10-CM | POA: Diagnosis not present

## 2017-10-12 DIAGNOSIS — F0390 Unspecified dementia without behavioral disturbance: Secondary | ICD-10-CM | POA: Diagnosis not present

## 2017-10-12 DIAGNOSIS — H47321 Drusen of optic disc, right eye: Secondary | ICD-10-CM | POA: Diagnosis not present

## 2017-10-12 DIAGNOSIS — Z8673 Personal history of transient ischemic attack (TIA), and cerebral infarction without residual deficits: Secondary | ICD-10-CM | POA: Diagnosis not present

## 2017-10-12 DIAGNOSIS — I2510 Atherosclerotic heart disease of native coronary artery without angina pectoris: Secondary | ICD-10-CM | POA: Diagnosis not present

## 2017-10-12 DIAGNOSIS — E119 Type 2 diabetes mellitus without complications: Secondary | ICD-10-CM | POA: Diagnosis not present

## 2017-10-12 DIAGNOSIS — R2689 Other abnormalities of gait and mobility: Secondary | ICD-10-CM | POA: Diagnosis not present

## 2017-10-12 LAB — HM DIABETES EYE EXAM

## 2017-10-13 ENCOUNTER — Encounter: Payer: Self-pay | Admitting: *Deleted

## 2017-10-13 DIAGNOSIS — I2510 Atherosclerotic heart disease of native coronary artery without angina pectoris: Secondary | ICD-10-CM | POA: Diagnosis not present

## 2017-10-13 DIAGNOSIS — Z8673 Personal history of transient ischemic attack (TIA), and cerebral infarction without residual deficits: Secondary | ICD-10-CM | POA: Diagnosis not present

## 2017-10-13 DIAGNOSIS — I252 Old myocardial infarction: Secondary | ICD-10-CM | POA: Diagnosis not present

## 2017-10-13 DIAGNOSIS — E119 Type 2 diabetes mellitus without complications: Secondary | ICD-10-CM | POA: Diagnosis not present

## 2017-10-13 DIAGNOSIS — F0390 Unspecified dementia without behavioral disturbance: Secondary | ICD-10-CM | POA: Diagnosis not present

## 2017-10-13 DIAGNOSIS — R2689 Other abnormalities of gait and mobility: Secondary | ICD-10-CM | POA: Diagnosis not present

## 2017-10-14 ENCOUNTER — Encounter: Payer: Self-pay | Admitting: *Deleted

## 2017-10-14 DIAGNOSIS — I252 Old myocardial infarction: Secondary | ICD-10-CM | POA: Diagnosis not present

## 2017-10-14 DIAGNOSIS — I2510 Atherosclerotic heart disease of native coronary artery without angina pectoris: Secondary | ICD-10-CM | POA: Diagnosis not present

## 2017-10-14 DIAGNOSIS — E119 Type 2 diabetes mellitus without complications: Secondary | ICD-10-CM | POA: Diagnosis not present

## 2017-10-14 DIAGNOSIS — Z8673 Personal history of transient ischemic attack (TIA), and cerebral infarction without residual deficits: Secondary | ICD-10-CM | POA: Diagnosis not present

## 2017-10-14 DIAGNOSIS — R2689 Other abnormalities of gait and mobility: Secondary | ICD-10-CM | POA: Diagnosis not present

## 2017-10-14 DIAGNOSIS — F0390 Unspecified dementia without behavioral disturbance: Secondary | ICD-10-CM | POA: Diagnosis not present

## 2017-10-18 ENCOUNTER — Encounter: Payer: Self-pay | Admitting: Physician Assistant

## 2017-10-18 ENCOUNTER — Ambulatory Visit (INDEPENDENT_AMBULATORY_CARE_PROVIDER_SITE_OTHER): Payer: Medicare Other | Admitting: Physician Assistant

## 2017-10-18 VITALS — BP 130/70 | HR 87 | Temp 97.5°F | Resp 16 | Wt 208.8 lb

## 2017-10-18 DIAGNOSIS — J029 Acute pharyngitis, unspecified: Secondary | ICD-10-CM

## 2017-10-18 DIAGNOSIS — B9689 Other specified bacterial agents as the cause of diseases classified elsewhere: Secondary | ICD-10-CM

## 2017-10-18 DIAGNOSIS — J988 Other specified respiratory disorders: Secondary | ICD-10-CM | POA: Diagnosis not present

## 2017-10-18 MED ORDER — CEFDINIR 300 MG PO CAPS: 300.0000 mg | ORAL_CAPSULE | Freq: Two times a day (BID) | ORAL | 0 refills | Status: DC

## 2017-10-18 NOTE — Progress Notes (Signed)
Patient ID: Lindsay Nguyen MRN: [id number], DOB: 07-11-1936, 82 y.o. Date of Encounter: 10/18/2017, 10:50 AM    Chief Complaint:  Chief Complaint  Patient presents with  . dry cough  . Sore Throat  . Otalgia     HPI: 82 y.o. year old female presents with above.   Her daughter accompanies her for visit. Daughter reports that patient's son lives with patient.  States that the patient's son had the same symptoms last week and saw his medical provider and has been prescribed Cefdinir antibiotic. Reports that patient started getting sick with same symptoms that son has-- that the patient started getting sick over this weekend.  Has had some sore throat.  Some cough-- mostly dry, hacky cough.  Also notes that her ears have hurt some and that in the past she has had problems with wax buildup in her ears and wants to have that checked as well that they have noticed her hearing has decreased over the last week as well.  Daughter notes the patient cannot use azithromycin secondary to prolonged QT.  She has not been having any fevers or chills.  No additional symptoms to address today.     Home Meds:   Outpatient Medications Prior to Visit  Medication Sig Dispense Refill  . albuterol (PROVENTIL) (2.5 MG/3ML) 0.083% nebulizer solution INHALE 1 NEBULE VIA NEBULIZER Q 2 HOURS PRN FOR WHEEZING OR SHORTNESS OF BREATH  5  . amLODipine (NORVASC) 2.5 MG tablet TAKE 1 TABLET(2.5 MG) BY MOUTH DAILY 90 tablet 3  . aspirin EC 81 MG tablet Take 1 tablet (81 mg total) by mouth daily. 90 tablet 3  . atorvastatin (LIPITOR) 40 MG tablet Take 1 tablet (40 mg total) by mouth daily at 6 PM. 30 tablet 0  . budesonide (PULMICORT) 0.25 MG/2ML nebulizer solution Take 0.25 mg by nebulization as directed.     . donepezil (ARICEPT) 10 MG tablet TAKE 1 TABLET(10 MG) BY MOUTH AT BEDTIME 90 tablet 3  . escitalopram (LEXAPRO) 10 MG tablet TAKE 1 TABLET(10 MG) BY MOUTH DAILY 90 tablet 3  . furosemide (LASIX) 40 MG tablet  TAKE 1 TABLET BY MOUTH DAILY AS NEEDED FOR FLUID RETENTION 90 tablet 1  . glimepiride (AMARYL) 2 MG tablet TAKE 1 TABLET(2 MG) BY MOUTH TWICE DAILY (Patient taking differently: TAKE 1 TABLET(2 MG) BY MOUTH TWICE DAILY/patient taking 1 daily) 180 tablet 1  . loratadine (CLARITIN) 10 MG tablet Take 10 mg by mouth daily as needed for allergies.    Marland Kitchen losartan (COZAAR) 50 MG tablet Take 1 tablet (50 mg total) by mouth daily. 90 tablet 3  . metoCLOPramide (REGLAN) 5 MG tablet Take 1 tablet (5 mg total) by mouth 4 (four) times daily -  before meals and at bedtime.    . metoprolol succinate (TOPROL-XL) 25 MG 24 hr tablet Take 1 tablet (25 mg total) by mouth daily. 30 tablet 0  . nitroGLYCERIN (NITROSTAT) 0.4 MG SL tablet Place 0.4 mg under the tongue every 5 (five) minutes as needed for chest pain (MAX 3 TABLETS).     . ONGLYZA 5 MG TABS tablet Take 1 tablet (5 mg total) by mouth daily. 90 tablet 1  . rivaroxaban (XARELTO) 20 MG TABS tablet Take 1 tablet (20 mg total) by mouth daily with supper. 30 tablet 0  . SYNTHROID 112 MCG tablet TAKE 1/2 TABLET BY MOUTH DAILY BEFORE BREAKFAST 90 tablet 1   No facility-administered medications prior to visit.     Allergies:  Allergies  Allergen Reactions  . Contrast Media [Iodinated Diagnostic Agents] Anaphylaxis and Swelling  . Iodine Anaphylaxis and Swelling  . Shellfish Allergy Anaphylaxis    unknown  . Gabapentin Hives      Review of Systems: See HPI for pertinent ROS. All other ROS negative.    Physical Exam: Blood pressure 130/70, pulse 87, temperature (!) 97.5 F (36.4 C), temperature source Oral, resp. rate 16, weight 94.7 kg (208 lb 12.8 oz), SpO2 95 %., Body mass index is 30.83 kg/m. General:  WNWD WF. Appears in no acute distress. HEENT: Normocephalic, atraumatic, eyes without discharge, sclera non-icteric, nares are without discharge.  Right ear canal is 100% obstructed with cerumen.  Left ear canal is about two thirds obstructed with  cerumen.  Oral cavity moist, posterior pharynx without exudate, erythema, peritonsillar abscess.  Neck: Supple. No thyromegaly. No lymphadenopathy. Lungs: Clear bilaterally to auscultation without wheezes, rales, or rhonchi. Breathing is unlabored. Heart: Irregular rhythm. Murmur. Msk:  Strength and tone normal for age. Extremities/Skin: Warm and dry.  Neuro: Alert and oriented X 3. Moves all extremities spontaneously. Gait is normal. CNII-XII grossly in tact. Psych:  Responds to questions appropriately with a normal affect.   Results for orders placed or performed in visit on 10/18/17  STREP GROUP A AG, W/REFLEX TO CULT  Result Value Ref Range   Streptococcus, Group A Screen (Direct) NONE DETECTED      ASSESSMENT AND PLAN:  82 y.o. year old female with  1. Bacterial respiratory infection She is to take [facility] as directed.  Follow-up if symptoms worsen significantly or do not resolve upon completion of antibiotic. - cefdinir (OMNICEF) 300 MG capsule; Take 1 capsule (300 mg total) by mouth 2 (two) times daily.  Dispense: 20 capsule; Refill: 0  2. Sore throat - STREP GROUP A AG, W/REFLEX TO CULT   Signed, [address] [place], [place], [facility] 10/18/2017 10:50 AM

## 2017-10-19 DIAGNOSIS — F0390 Unspecified dementia without behavioral disturbance: Secondary | ICD-10-CM | POA: Diagnosis not present

## 2017-10-19 DIAGNOSIS — Z8673 Personal history of transient ischemic attack (TIA), and cerebral infarction without residual deficits: Secondary | ICD-10-CM | POA: Diagnosis not present

## 2017-10-19 DIAGNOSIS — I2510 Atherosclerotic heart disease of native coronary artery without angina pectoris: Secondary | ICD-10-CM | POA: Diagnosis not present

## 2017-10-19 DIAGNOSIS — I252 Old myocardial infarction: Secondary | ICD-10-CM | POA: Diagnosis not present

## 2017-10-19 DIAGNOSIS — R2689 Other abnormalities of gait and mobility: Secondary | ICD-10-CM | POA: Diagnosis not present

## 2017-10-19 DIAGNOSIS — E119 Type 2 diabetes mellitus without complications: Secondary | ICD-10-CM | POA: Diagnosis not present

## 2017-10-20 LAB — CULTURE, GROUP A STREP
MICRO NUMBER: 90338245
SPECIMEN QUALITY: ADEQUATE

## 2017-10-20 LAB — STREP GROUP A AG, W/REFLEX TO CULT: Streptococcus, Group A Screen (Direct): NOT DETECTED

## 2017-10-21 DIAGNOSIS — R2689 Other abnormalities of gait and mobility: Secondary | ICD-10-CM | POA: Diagnosis not present

## 2017-10-21 DIAGNOSIS — I2510 Atherosclerotic heart disease of native coronary artery without angina pectoris: Secondary | ICD-10-CM | POA: Diagnosis not present

## 2017-10-21 DIAGNOSIS — I252 Old myocardial infarction: Secondary | ICD-10-CM | POA: Diagnosis not present

## 2017-10-21 DIAGNOSIS — Z8673 Personal history of transient ischemic attack (TIA), and cerebral infarction without residual deficits: Secondary | ICD-10-CM | POA: Diagnosis not present

## 2017-10-21 DIAGNOSIS — E119 Type 2 diabetes mellitus without complications: Secondary | ICD-10-CM | POA: Diagnosis not present

## 2017-10-21 DIAGNOSIS — F0390 Unspecified dementia without behavioral disturbance: Secondary | ICD-10-CM | POA: Diagnosis not present

## 2017-10-22 DIAGNOSIS — R2689 Other abnormalities of gait and mobility: Secondary | ICD-10-CM | POA: Diagnosis not present

## 2017-10-22 DIAGNOSIS — Z8673 Personal history of transient ischemic attack (TIA), and cerebral infarction without residual deficits: Secondary | ICD-10-CM | POA: Diagnosis not present

## 2017-10-22 DIAGNOSIS — I252 Old myocardial infarction: Secondary | ICD-10-CM | POA: Diagnosis not present

## 2017-10-22 DIAGNOSIS — I2510 Atherosclerotic heart disease of native coronary artery without angina pectoris: Secondary | ICD-10-CM | POA: Diagnosis not present

## 2017-10-22 DIAGNOSIS — E119 Type 2 diabetes mellitus without complications: Secondary | ICD-10-CM | POA: Diagnosis not present

## 2017-10-22 DIAGNOSIS — F0390 Unspecified dementia without behavioral disturbance: Secondary | ICD-10-CM | POA: Diagnosis not present

## 2017-10-25 ENCOUNTER — Ambulatory Visit (INDEPENDENT_AMBULATORY_CARE_PROVIDER_SITE_OTHER): Payer: Medicare Other | Admitting: Family Medicine

## 2017-10-25 ENCOUNTER — Encounter: Payer: Self-pay | Admitting: Family Medicine

## 2017-10-25 VITALS — BP 132/70 | HR 90 | Temp 97.6°F | Resp 16 | Ht 69.0 in | Wt 206.0 lb

## 2017-10-25 DIAGNOSIS — Z8673 Personal history of transient ischemic attack (TIA), and cerebral infarction without residual deficits: Secondary | ICD-10-CM | POA: Diagnosis not present

## 2017-10-25 DIAGNOSIS — G459 Transient cerebral ischemic attack, unspecified: Secondary | ICD-10-CM

## 2017-10-25 DIAGNOSIS — I252 Old myocardial infarction: Secondary | ICD-10-CM | POA: Diagnosis not present

## 2017-10-25 DIAGNOSIS — R2689 Other abnormalities of gait and mobility: Secondary | ICD-10-CM | POA: Diagnosis not present

## 2017-10-25 DIAGNOSIS — F0390 Unspecified dementia without behavioral disturbance: Secondary | ICD-10-CM | POA: Diagnosis not present

## 2017-10-25 DIAGNOSIS — E119 Type 2 diabetes mellitus without complications: Secondary | ICD-10-CM | POA: Diagnosis not present

## 2017-10-25 DIAGNOSIS — I2510 Atherosclerotic heart disease of native coronary artery without angina pectoris: Secondary | ICD-10-CM | POA: Diagnosis not present

## 2017-10-25 NOTE — Progress Notes (Signed)
Subjective:    Patient ID: Lindsay Nguyen, female    DOB: January 29, 1936, 82 y.o.   MRN: [id number]  HPI Patient was recently admitted to the hospital.  I have copied relevant portions of the discharge summary below for my reference:  Admit date: 09/29/2017 Discharge date: 10/01/2017  Time spent: > 35 minutes  Recommendations for Outpatient Follow-up:  1. Ensure patient f/u with neurology and cardiology   Discharge Diagnoses:  Principal Problem:   TIA (transient ischemic attack) Active Problems:   Hypothyroidism   Controlled diabetes mellitus type II without complication (HCC)   HLD (hyperlipidemia)   Essential hypertension   PAF (paroxysmal atrial fibrillation) (HCC)   Chronic anticoagulation   Dementia   Medication noncompliance due to cognitive impairment   Discharge Condition: stable  Diet recommendation: Heart healthy diet     Filed Weights   09/29/17 1828  Weight: 92.5 kg (204 lb)    History of present illness:  82 y.o.femalewith medical history significant ofhypothyroidism; AAA s/p grafting; CAD; HTN; HLD; DM; and afibon Eliquis (once daily at best)presentingafter she developed slurred speech,wasa bit unsteady on her feet, perhapsbecameconfused  Patient worked up for [place] Hospital Course:  TIA - patient had full work up. Neurology consulted.  - Placed patient on lipitor 40 mg po daily - changed eliquis for xarelto - changed coreg for once a day toprol xl due to problems with compliance secondary to dementia. - PT recommended home health PT  Atrial fibrillation - Pt will be d/c on xarelto - rate control with toprol xl  Procedures:  none  Consultations:  Neurology  cardiology     Patient is here today for follow-up.  She is accompanied by her son.  He states that they had not been as compliant as they should have been having her take Coreg twice daily along with Eliquis twice daily.  They are now doing much better on once  daily Toprol and Xarelto.  However on Toprol-XL 25 mg a day, her heart rate is elevated in the 90s.  She denies any palpitations.  She denies any chest pain.  She denies any shortness of breath.  She denies any syncope.  She denies any further episodes of TIA or stroke.  She continues to battle dementia.  Otherwise she is doing well with no concerns Past Medical History:  Diagnosis Date  . Anemia   . Atrial fibrillation ([place])    persistent; on Eliquis  . CAD (coronary artery disease)    2D ECHO, 11/04/2010 - EF >55%, mild-moderate mitral regurgitation, moderate tricuspid regurgitation  . Dementia   . Diabetes mellitus   . GERD (gastroesophageal reflux disease)   . Hyperlipidemia   . Hypertension   . Non-STEMI (non-ST elevated myocardial infarction) (HCC)    NUCLEAR STRESS TEST, 11/04/2010 - normal, no ischemia  . Osteopenia   . Pain in limb    LEA VENOUS DUPLEX, 06/27/2010 - no evidence of DVT  . Thoracic aortic aneurysm (HCC)    status post resection and grafting  . Thyroid disease    hypothyroid   Past Surgical History:  Procedure Laterality Date  . CARDIAC CATHETERIZATION  06/18/2010   Recommended CABG  . CARDIOVERSION N/A 09/30/2015   Procedure: CARDIOVERSION;  Surgeon: [name], MD;  Location: [facility] ENDOSCOPY;  Service: Cardiovascular;  Laterality: N/A;  . CARDIOVERSION N/A 06/05/2016   Procedure: CARDIOVERSION;  Surgeon: [name], MD;  Location: [facility] ENDOSCOPY;  Service: Cardiovascular;  Laterality: N/A;  . CHOLECYSTECTOMY    .  CORONARY ARTERY BYPASS GRAFT  06/18/2010   LIMA-LAD, vein-diagonal branch, vein-obtuse marginal branch, and resectioning and grafting of throacic aortic aneurysm  . ELBOW SURGERY  1979   fracture left  . THORACIC AORTIC ANEURYSM REPAIR    . TONSILLECTOMY  1945  . TUBAL LIGATION  1971   Current Outpatient Medications on File Prior to Visit  Medication Sig Dispense Refill  . albuterol (PROVENTIL) (2.5 MG/3ML) 0.083% nebulizer solution INHALE 1  NEBULE VIA NEBULIZER Q 2 HOURS PRN FOR WHEEZING OR SHORTNESS OF BREATH  5  . amLODipine (NORVASC) 2.5 MG tablet TAKE 1 TABLET(2.5 MG) BY MOUTH DAILY 90 tablet 3  . aspirin EC 81 MG tablet Take 1 tablet (81 mg total) by mouth daily. 90 tablet 3  . atorvastatin (LIPITOR) 40 MG tablet Take 1 tablet (40 mg total) by mouth daily at 6 PM. 30 tablet 0  . budesonide (PULMICORT) 0.25 MG/2ML nebulizer solution Take 0.25 mg by nebulization as directed.     . donepezil (ARICEPT) 10 MG tablet TAKE 1 TABLET(10 MG) BY MOUTH AT BEDTIME 90 tablet 3  . escitalopram (LEXAPRO) 10 MG tablet TAKE 1 TABLET(10 MG) BY MOUTH DAILY 90 tablet 3  . furosemide (LASIX) 40 MG tablet TAKE 1 TABLET BY MOUTH DAILY AS NEEDED FOR FLUID RETENTION 90 tablet 1  . glimepiride (AMARYL) 2 MG tablet TAKE 1 TABLET(2 MG) BY MOUTH TWICE DAILY (Patient taking differently: TAKE 1 TABLET(2 MG) BY MOUTH TWICE DAILY/patient taking 1 daily) 180 tablet 1  . loratadine (CLARITIN) 10 MG tablet Take 10 mg by mouth daily as needed for allergies.    Marland Kitchen losartan (COZAAR) 50 MG tablet Take 1 tablet (50 mg total) by mouth daily. 90 tablet 3  . metoCLOPramide (REGLAN) 5 MG tablet Take 1 tablet (5 mg total) by mouth 4 (four) times daily -  before meals and at bedtime.    . metoprolol succinate (TOPROL-XL) 25 MG 24 hr tablet Take 1 tablet (25 mg total) by mouth daily. 30 tablet 0  . nitroGLYCERIN (NITROSTAT) 0.4 MG SL tablet Place 0.4 mg under the tongue every 5 (five) minutes as needed for chest pain (MAX 3 TABLETS).     . ONGLYZA 5 MG TABS tablet Take 1 tablet (5 mg total) by mouth daily. 90 tablet 1  . rivaroxaban (XARELTO) 20 MG TABS tablet Take 1 tablet (20 mg total) by mouth daily with supper. 30 tablet 0  . SYNTHROID 112 MCG tablet TAKE 1/2 TABLET BY MOUTH DAILY BEFORE BREAKFAST 90 tablet 1   No current facility-administered medications on file prior to visit.    Allergies  Allergen Reactions  . Contrast Media [Iodinated Diagnostic Agents] Anaphylaxis  and Swelling  . Iodine Anaphylaxis and Swelling  . Shellfish Allergy Anaphylaxis    unknown  . Gabapentin Hives   Social History   Socioeconomic History  . Marital status: Married    Spouse name: Not on file  . Number of children: Not on file  . Years of education: Not on file  . Highest education level: Not on file  Occupational History  . Not on file  Social Needs  . Financial resource strain: Not on file  . Food insecurity:    Worry: Not on file    Inability: Not on file  . Transportation needs:    Medical: Not on file    Non-medical: Not on file  Tobacco Use  . Smoking status: Never Smoker  . Smokeless tobacco: Never Used  Substance and Sexual Activity  .  Alcohol use: No  . Drug use: No  . Sexual activity: Not on file  Lifestyle  . Physical activity:    Days per week: Not on file    Minutes per session: Not on file  . Stress: Not on file  Relationships  . Social connections:    Talks on phone: Not on file    Gets together: Not on file    Attends religious service: Not on file    Active member of club or organization: Not on file    Attends meetings of clubs or organizations: Not on file    Relationship status: Not on file  . Intimate partner violence:    Fear of current or ex partner: Not on file    Emotionally abused: Not on file    Physically abused: Not on file    Forced sexual activity: Not on file  Other Topics Concern  . Not on file  Social History Narrative   Patient's daughter is a Designer, jewellery who does hospitalist work at [employer].  Her son is also in health care.      Review of Systems  All other systems reviewed and are negative.      Objective:   Physical Exam  Constitutional: She is oriented to person, place, and time. She appears well-developed and well-nourished. No distress.  Neck: Neck supple. No JVD present.  Cardiovascular: Normal rate. An irregularly irregular rhythm present.  Murmur heard. Pulmonary/Chest:  Effort normal and breath sounds normal. No respiratory distress. She has no wheezes. She has no rales.  Abdominal: Soft. Bowel sounds are normal. She exhibits no distension. There is no tenderness. There is no rebound.  Musculoskeletal: She exhibits no edema.  Neurological: She is alert and oriented to person, place, and time. She displays normal reflexes. No cranial nerve deficit. She exhibits normal muscle tone. Coordination normal.  Skin: She is not diaphoretic.  Vitals reviewed.         Assessment & Plan:  TIA (transient ischemic attack) - Plan: CBC with Differential/Platelet, COMPLETE METABOLIC PANEL WITH GFR  Fortunately, symptoms have completely resolved.  I stressed the importance of taking her anticoagulation as directed.  I do believe that she would benefit from higher dose of Toprol to better control her heart rate ideally less than 70 bpm.  However she has an appointment with her cardiologist tomorrow and I will defer the decision to increase Toprol to Dr. Alvester Chou.  I will check a follow-up CBC along with CMP while the patient is here today.  Now that she is compliant with medication, I would recommend follow-up in 3 months as previously directed.

## 2017-10-26 ENCOUNTER — Encounter: Payer: Self-pay | Admitting: Cardiovascular Disease

## 2017-10-26 ENCOUNTER — Encounter: Payer: Self-pay | Admitting: Family Medicine

## 2017-10-26 ENCOUNTER — Ambulatory Visit (INDEPENDENT_AMBULATORY_CARE_PROVIDER_SITE_OTHER): Payer: Medicare Other | Admitting: Cardiovascular Disease

## 2017-10-26 VITALS — BP 131/73 | HR 82 | Ht 69.0 in | Wt 207.0 lb

## 2017-10-26 DIAGNOSIS — E7800 Pure hypercholesterolemia, unspecified: Secondary | ICD-10-CM

## 2017-10-26 DIAGNOSIS — Z951 Presence of aortocoronary bypass graft: Secondary | ICD-10-CM | POA: Diagnosis not present

## 2017-10-26 DIAGNOSIS — I739 Peripheral vascular disease, unspecified: Secondary | ICD-10-CM | POA: Diagnosis not present

## 2017-10-26 DIAGNOSIS — I2510 Atherosclerotic heart disease of native coronary artery without angina pectoris: Secondary | ICD-10-CM | POA: Diagnosis not present

## 2017-10-26 DIAGNOSIS — Z8673 Personal history of transient ischemic attack (TIA), and cerebral infarction without residual deficits: Secondary | ICD-10-CM | POA: Diagnosis not present

## 2017-10-26 DIAGNOSIS — I480 Paroxysmal atrial fibrillation: Secondary | ICD-10-CM | POA: Diagnosis not present

## 2017-10-26 DIAGNOSIS — I10 Essential (primary) hypertension: Secondary | ICD-10-CM

## 2017-10-26 DIAGNOSIS — E119 Type 2 diabetes mellitus without complications: Secondary | ICD-10-CM | POA: Diagnosis not present

## 2017-10-26 DIAGNOSIS — F0390 Unspecified dementia without behavioral disturbance: Secondary | ICD-10-CM | POA: Diagnosis not present

## 2017-10-26 DIAGNOSIS — R2689 Other abnormalities of gait and mobility: Secondary | ICD-10-CM | POA: Diagnosis not present

## 2017-10-26 DIAGNOSIS — I252 Old myocardial infarction: Secondary | ICD-10-CM | POA: Diagnosis not present

## 2017-10-26 LAB — CBC WITH DIFFERENTIAL/PLATELET
BASOS PCT: 0.5 %
Basophils Absolute: 41 cells/uL (ref 0–200)
EOS PCT: 3.6 %
Eosinophils Absolute: 292 cells/uL (ref 15–500)
HCT: 41.6 % (ref 35.0–45.0)
Hemoglobin: 14.5 g/dL (ref 11.7–15.5)
Lymphs Abs: 1701 cells/uL (ref 850–3900)
MCH: 33.2 pg — ABNORMAL HIGH (ref 27.0–33.0)
MCHC: 34.9 g/dL (ref 32.0–36.0)
MCV: 95.2 fL (ref 80.0–100.0)
MONOS PCT: 10.5 %
MPV: 11.3 fL (ref 7.5–12.5)
Neutro Abs: 5216 cells/uL (ref 1500–7800)
Neutrophils Relative %: 64.4 %
PLATELETS: 257 10*3/uL (ref 140–400)
RBC: 4.37 10*6/uL (ref 3.80–5.10)
RDW: 12.3 % (ref 11.0–15.0)
TOTAL LYMPHOCYTE: 21 %
WBC: 8.1 10*3/uL (ref 3.8–10.8)
WBCMIX: 851 {cells}/uL (ref 200–950)

## 2017-10-26 LAB — COMPLETE METABOLIC PANEL WITH GFR
AG Ratio: 1.1 (calc) (ref 1.0–2.5)
ALT: 13 U/L (ref 6–29)
AST: 19 U/L (ref 10–35)
Albumin: 3.7 g/dL (ref 3.6–5.1)
Alkaline phosphatase (APISO): 71 U/L (ref 33–130)
BUN/Creatinine Ratio: 17 (calc) (ref 6–22)
BUN: 18 mg/dL (ref 7–25)
CALCIUM: 9 mg/dL (ref 8.6–10.4)
CO2: 26 mmol/L (ref 20–32)
CREATININE: 1.05 mg/dL — AB (ref 0.60–0.93)
Chloride: 100 mmol/L (ref 98–110)
GFR, EST AFRICAN AMERICAN: 58 mL/min/{1.73_m2} — AB (ref >=60)
GFR, EST NON AFRICAN AMERICAN: 50 mL/min/{1.73_m2} — AB (ref >=60)
GLUCOSE: 327 mg/dL — AB (ref 65–99)
Globulin: 3.5 g/dL (calc) (ref 1.9–3.7)
Potassium: 4.3 mmol/L (ref 3.5–5.3)
Sodium: 138 mmol/L (ref 135–146)
TOTAL PROTEIN: 7.2 g/dL (ref 6.1–8.1)
Total Bilirubin: 0.5 mg/dL (ref 0.2–1.2)

## 2017-10-26 MED ORDER — NITROGLYCERIN 0.4 MG SL SUBL: 0.4000 mg | SUBLINGUAL_TABLET | SUBLINGUAL | 4 refills | Status: AC | PRN

## 2017-10-26 MED ORDER — METOPROLOL SUCCINATE ER 50 MG PO TB24: 50.0000 mg | ORAL_TABLET | Freq: Every day | ORAL | 6 refills | Status: DC

## 2017-10-26 MED ORDER — ATORVASTATIN CALCIUM 40 MG PO TABS: 40.0000 mg | ORAL_TABLET | Freq: Every day | ORAL | 6 refills | Status: DC

## 2017-10-26 NOTE — Assessment & Plan Note (Signed)
History of essential hypertension. Blood pressure measures 131/73. She is on metoprolol, amlodipine and losartan. Continue current meds at current dosing.

## 2017-10-26 NOTE — Progress Notes (Signed)
10/26/2017 [place]   [birth year]  [id number]  Primary Physician [name], MD Primary Cardiologist: [name] MD [name], [place], [place]  HPI:  Lindsay Nguyen is a 82 y.o.  mildly overweight, married Caucasian female, mother of 2, grandmother to 2 grandchildren accompanied by her  daughter..I last saw her in the office 02/02/17. Her risk factors include family history, hypertension, hyperlipidemia and noninsulin-requiring diabetes. She had a non-ST-segment-elevation myocardial infarction March 21, 2010, with peak troponin of 4. I brought her to the cath lab the following day and stented her proximal LAD with a bare-metal stent. She did have nonobstructive disease beyond the stent as well as a diagonal branch, AV groove circumflex with an EF of 45% and anteroapical akinesia. Because of a generous thoracic aorta on cath, I performed a CT angiogram on her revealing her thoracic aorta which measured 4.9 cm. She was seen by Dr. Gilford Raid for evaluation of this at my request. Myoview performed June 11, 2010, showed apical and inferior ischemia as well as anterior ischemia. I noted her June 17, 2010, with rapid onset dyspnea relieved with sublingual nitroglycerin. The patient ruled out for myocardial infarction and was heparinized. I ultimately cath'd her November 16 revealing aggressive "in-stent restenosis" within the proximal LAD stent, as well as in her diagonal branch and AV groove circumflex.   She ultimately underwent coronary artery bypass grafting by Dr. Gilford Raid with LIMA to her LAD, a vein to a diagonal branch, as well as obtuse marginal branch with resection and grafting of her thoracic aortic aneurysm using a 20-mm supracoronary Hemashield 2 graft utilizing deep hypothermic circulatory arrest with resuspension of her native aortic valve and reimplantation of her native coronary arteries. Her postop course was complicated and prolonged, lasting 4 weeks. She did  have atrial fibrillation, pneumonia, cholecystitis status post cholecystectomy, as well as pleural effusions. She had a percutaneous drain of her gallbladder and ultimately cholecystectomy by Dr. Excell Seltzer September 18, 2010. She recuperated nicely. She had a Myoview performed November 04, 2010, which was nonischemic and a 2D echo that showed normal LV systolic function with a normal functioning aortic valve.  When I saw her last she was in atrial fibrillation which was confirmed on 30 day event monitoring with tachybradycardia syndrome. She was placed on oral anticoagulation and ultimately underwent successful outpatient cardioversion by Dr. Dorris Carnes 09/30/15. She remains in sinus rhythm/sinus bradycardia. She was hospitalized in [place] 10/15/15 for 2 days with upper respiratory tract infection. She did have diastolic heart failure that time with a BNP of greater than 2000 and was diuresed. A 2-D echo did show moderate to severe pulmonary hypertension without PA systolic pressure of 18-84 mmHg and septal flattening. Apparently a pulmonary embolus was ruled out at that time. A subsequent 2-D echo performed 11/25/15 showed normal pulmonary artery pressures and normal LV function. She did have an outpatient sleep study that did not show sleep apnea. She's had increasing fatigue and dyspnea over the last several weeks.Because she has been symptomatic in [name] I'll arrange for her to undergo outpatient DC cardioversion. This occurred successfully performed by Dr. Sallyanne Kuster on 06/05/16 and she was seen by Roderic Palau in the [name] clinic on 06/16/16.   Since I saw her in the office 02/02/17 she had done well until recently when she was admitted with a TIA. This was characterized by slurred speech and difficulty walking. Her symptoms ultimately resolved. Neurologic workup was unremarkable.  Her Eliquis was changed to Xarelto because of compliance issues..     Current Meds  Medication Sig  .  albuterol (PROVENTIL) (2.5 MG/3ML) 0.083% nebulizer solution INHALE 1 NEBULE VIA NEBULIZER Q 2 HOURS PRN FOR WHEEZING OR SHORTNESS OF BREATH  . amLODipine (NORVASC) 2.5 MG tablet TAKE 1 TABLET(2.5 MG) BY MOUTH DAILY  . aspirin EC 81 MG tablet Take 1 tablet (81 mg total) by mouth daily.  Marland Kitchen atorvastatin (LIPITOR) 40 MG tablet Take 1 tablet (40 mg total) by mouth daily at 6 PM.  . budesonide (PULMICORT) 0.25 MG/2ML nebulizer solution Take 0.25 mg by nebulization as directed.   . donepezil (ARICEPT) 10 MG tablet TAKE 1 TABLET(10 MG) BY MOUTH AT BEDTIME  . escitalopram (LEXAPRO) 10 MG tablet TAKE 1 TABLET(10 MG) BY MOUTH DAILY  . furosemide (LASIX) 40 MG tablet TAKE 1 TABLET BY MOUTH DAILY AS NEEDED FOR FLUID RETENTION  . glimepiride (AMARYL) 2 MG tablet TAKE 1 TABLET(2 MG) BY MOUTH TWICE DAILY (Patient taking differently: TAKE 1 TABLET(2 MG) BY MOUTH TWICE DAILY/patient taking 1 daily)  . loratadine (CLARITIN) 10 MG tablet Take 10 mg by mouth daily as needed for allergies.  Marland Kitchen losartan (COZAAR) 50 MG tablet Take 1 tablet (50 mg total) by mouth daily.  . metoCLOPramide (REGLAN) 5 MG tablet Take 1 tablet (5 mg total) by mouth 4 (four) times daily -  before meals and at bedtime.  . metoprolol succinate (TOPROL-XL) 50 MG 24 hr tablet Take 1 tablet (50 mg total) by mouth daily.  . nitroGLYCERIN (NITROSTAT) 0.4 MG SL tablet Place 1 tablet (0.4 mg total) under the tongue every 5 (five) minutes as needed for chest pain (MAX 3 TABLETS).  . ONGLYZA 5 MG TABS tablet Take 1 tablet (5 mg total) by mouth daily.  . rivaroxaban (XARELTO) 20 MG TABS tablet Take 1 tablet (20 mg total) by mouth daily with supper.  Marland Kitchen SYNTHROID 112 MCG tablet TAKE 1/2 TABLET BY MOUTH DAILY BEFORE BREAKFAST  . [DISCONTINUED] atorvastatin (LIPITOR) 40 MG tablet Take 1 tablet (40 mg total) by mouth daily at 6 PM.  . [DISCONTINUED] atorvastatin (LIPITOR) 40 MG tablet Take 1 tablet (40 mg total) by mouth daily at 6 PM.  . [DISCONTINUED]  metoprolol succinate (TOPROL-XL) 25 MG 24 hr tablet Take 1 tablet (25 mg total) by mouth daily.  . [DISCONTINUED] nitroGLYCERIN (NITROSTAT) 0.4 MG SL tablet Place 0.4 mg under the tongue every 5 (five) minutes as needed for chest pain (MAX 3 TABLETS).      Allergies  Allergen Reactions  . Contrast Media [Iodinated Diagnostic Agents] Anaphylaxis and Swelling  . Iodine Anaphylaxis and Swelling  . Shellfish Allergy Anaphylaxis    unknown  . Gabapentin Hives    Social History   Socioeconomic History  . Marital status: Married    Spouse name: Not on file  . Number of children: Not on file  . Years of education: Not on file  . Highest education level: Not on file  Occupational History  . Not on file  Social Needs  . Financial resource strain: Not on file  . Food insecurity:    Worry: Not on file    Inability: Not on file  . Transportation needs:    Medical: Not on file    Non-medical: Not on file  Tobacco Use  . Smoking status: Never Smoker  . Smokeless tobacco: Never Used  Substance and Sexual Activity  . Alcohol use: No  . Drug use: No  .  Sexual activity: Not on file  Lifestyle  . Physical activity:    Days per week: Not on file    Minutes per session: Not on file  . Stress: Not on file  Relationships  . Social connections:    Talks on phone: Not on file    Gets together: Not on file    Attends religious service: Not on file    Active member of club or organization: Not on file    Attends meetings of clubs or organizations: Not on file    Relationship status: Not on file  . Intimate partner violence:    Fear of current or ex partner: Not on file    Emotionally abused: Not on file    Physically abused: Not on file    Forced sexual activity: Not on file  Other Topics Concern  . Not on file  Social History Narrative   Patient's daughter is a Designer, jewellery who does hospitalist work at [employer].  Her son is also in health care.     Review of  Systems: General: negative for chills, fever, night sweats or weight changes.  Cardiovascular: negative for chest pain, dyspnea on exertion, edema, orthopnea, palpitations, paroxysmal nocturnal dyspnea or shortness of breath Dermatological: negative for rash Respiratory: negative for cough or wheezing Urologic: negative for hematuria Abdominal: negative for nausea, vomiting, diarrhea, bright red blood per rectum, melena, or hematemesis Neurologic: negative for visual changes, syncope, or dizziness All other systems reviewed and are otherwise negative except as noted above.    Blood pressure 131/73, pulse 82, height 5\' 9"  (1.753 m), weight 207 lb (93.9 kg).  General appearance: alert and no distress Neck: no adenopathy, no carotid bruit, no JVD, supple, symmetrical, trachea midline and thyroid not enlarged, symmetric, no tenderness/mass/nodules Lungs: clear to auscultation bilaterally Heart: irregularly irregular rhythm Extremities: extremities normal, atraumatic, no cyanosis or edema Pulses: 2+ and symmetric Skin: Skin color, texture, turgor normal. No rashes or lesions Neurologic: Alert and oriented X 3, normal strength and tone. Normal symmetric reflexes. Normal coordination and gait  EKG atrial fibrillation with ventricular response of 71, left bundle branch block. I personally reviewed this EKG  ASSESSMENT AND PLAN:   HLD (hyperlipidemia) History of hyperlipidemia on statin therapy with lipid profile performed 08/05/17 revealing total cholesterol 164 which is increased from a year ago when it was 123. We we will write her a prescription for Lipitor 40 mg and recheck a lipid liver profile in 3 months.  Essential hypertension History of essential hypertension. Blood pressure measures 131/73. She is on metoprolol, amlodipine and losartan. Continue current meds at current dosing.  Hx of CABG History of coronary artery disease status post bypass grafting by Dr. Cyndia Bent with a LIMA to  LAD, vein to obtuse marginal branch. This was done afterseveral stent procedures in the past dating back to 2011.she currently denies chest pain  PVD (peripheral vascular disease)-  History of thoracic aortic aneurysm repair by Dr. Cyndia Bent in 2011 at the time of her bypass grafting.  PAF (paroxysmal atrial fibrillation) (HCC) History of [name] with multiple cardioversion in the past currently rate controlled on Eliquis is oral anticoagulation with was recently changed to Xarelto because of a TIA and potential noncompliance.      [name] MD FACP,FACC,FAHA, [facility] 10/26/2017 2:45 PM

## 2017-10-26 NOTE — Assessment & Plan Note (Signed)
History of [name] with multiple cardioversion in the past currently rate controlled on Eliquis is oral anticoagulation with was recently changed to Xarelto because of a TIA and potential noncompliance.

## 2017-10-26 NOTE — Assessment & Plan Note (Signed)
History of hyperlipidemia on statin therapy with lipid profile performed 08/05/17 revealing total cholesterol 164 which is increased from a year ago when it was 123. We we will write her a prescription for Lipitor 40 mg and recheck a lipid liver profile in 3 months.

## 2017-10-26 NOTE — Assessment & Plan Note (Signed)
History of thoracic aortic aneurysm repair by Dr. Cyndia Bent in 2011 at the time of her bypass grafting.

## 2017-10-26 NOTE — Assessment & Plan Note (Signed)
History of coronary artery disease status post bypass grafting by Dr. Cyndia Bent with a LIMA to LAD, vein to obtuse marginal branch. This was done afterseveral stent procedures in the past dating back to 2011.she currently denies chest pain

## 2017-10-26 NOTE — Patient Instructions (Signed)
Medication Instructions: Your physician recommends that you continue on your current medications as directed. Please refer to the Current Medication list given to you today.  Take 40 mg Atorvastatin daily.  Increase Metoprolol to 50 mg daily.   Labwork: Your physician recommends that you return for a FASTING lipid profile and hepatic function panel in 3 months (June).   Follow-Up: We request that you follow-up in: 6 months with an extender and in 12 months with Dr Andria Rhein will receive a reminder letter in the mail two months in advance. If you don't receive a letter, please call our office to schedule the follow-up appointment.  If you need a refill on your cardiac medications before your next appointment, please call your pharmacy.

## 2017-10-27 ENCOUNTER — Ambulatory Visit: Payer: Medicare Other | Admitting: Family Medicine

## 2017-10-27 DIAGNOSIS — I639 Cerebral infarction, unspecified: Secondary | ICD-10-CM | POA: Diagnosis not present

## 2017-10-28 DIAGNOSIS — I252 Old myocardial infarction: Secondary | ICD-10-CM | POA: Diagnosis not present

## 2017-10-28 DIAGNOSIS — F0390 Unspecified dementia without behavioral disturbance: Secondary | ICD-10-CM | POA: Diagnosis not present

## 2017-10-28 DIAGNOSIS — I2510 Atherosclerotic heart disease of native coronary artery without angina pectoris: Secondary | ICD-10-CM | POA: Diagnosis not present

## 2017-10-28 DIAGNOSIS — E119 Type 2 diabetes mellitus without complications: Secondary | ICD-10-CM | POA: Diagnosis not present

## 2017-10-28 DIAGNOSIS — R2689 Other abnormalities of gait and mobility: Secondary | ICD-10-CM | POA: Diagnosis not present

## 2017-10-28 DIAGNOSIS — Z8673 Personal history of transient ischemic attack (TIA), and cerebral infarction without residual deficits: Secondary | ICD-10-CM | POA: Diagnosis not present

## 2017-11-01 DIAGNOSIS — Z8673 Personal history of transient ischemic attack (TIA), and cerebral infarction without residual deficits: Secondary | ICD-10-CM | POA: Diagnosis not present

## 2017-11-01 DIAGNOSIS — I2510 Atherosclerotic heart disease of native coronary artery without angina pectoris: Secondary | ICD-10-CM | POA: Diagnosis not present

## 2017-11-01 DIAGNOSIS — E119 Type 2 diabetes mellitus without complications: Secondary | ICD-10-CM | POA: Diagnosis not present

## 2017-11-01 DIAGNOSIS — R2689 Other abnormalities of gait and mobility: Secondary | ICD-10-CM | POA: Diagnosis not present

## 2017-11-01 DIAGNOSIS — I252 Old myocardial infarction: Secondary | ICD-10-CM | POA: Diagnosis not present

## 2017-11-01 DIAGNOSIS — F0390 Unspecified dementia without behavioral disturbance: Secondary | ICD-10-CM | POA: Diagnosis not present

## 2017-11-03 ENCOUNTER — Other Ambulatory Visit: Payer: Self-pay | Admitting: Family Medicine

## 2017-11-03 ENCOUNTER — Other Ambulatory Visit: Payer: Self-pay | Admitting: *Deleted

## 2017-11-03 DIAGNOSIS — R2689 Other abnormalities of gait and mobility: Secondary | ICD-10-CM | POA: Diagnosis not present

## 2017-11-03 DIAGNOSIS — I252 Old myocardial infarction: Secondary | ICD-10-CM | POA: Diagnosis not present

## 2017-11-03 DIAGNOSIS — I2510 Atherosclerotic heart disease of native coronary artery without angina pectoris: Secondary | ICD-10-CM | POA: Diagnosis not present

## 2017-11-03 DIAGNOSIS — F0390 Unspecified dementia without behavioral disturbance: Secondary | ICD-10-CM | POA: Diagnosis not present

## 2017-11-03 DIAGNOSIS — Z8673 Personal history of transient ischemic attack (TIA), and cerebral infarction without residual deficits: Secondary | ICD-10-CM | POA: Diagnosis not present

## 2017-11-03 DIAGNOSIS — E119 Type 2 diabetes mellitus without complications: Secondary | ICD-10-CM | POA: Diagnosis not present

## 2017-11-03 MED ORDER — METOPROLOL SUCCINATE ER 50 MG PO TB24: 50.0000 mg | ORAL_TABLET | Freq: Every day | ORAL | 11 refills | Status: DC

## 2017-11-05 ENCOUNTER — Other Ambulatory Visit: Payer: Self-pay | Admitting: Cardiovascular Disease

## 2017-11-05 ENCOUNTER — Other Ambulatory Visit: Payer: Self-pay | Admitting: Pharmacist Clinician (PhC)/ Clinical Pharmacy Specialist

## 2017-11-05 ENCOUNTER — Other Ambulatory Visit: Payer: Self-pay | Admitting: Family Medicine

## 2017-11-05 MED ORDER — METOPROLOL SUCCINATE ER 50 MG PO TB24: 50.0000 mg | ORAL_TABLET | Freq: Every day | ORAL | 3 refills | Status: DC

## 2017-11-05 MED ORDER — RIVAROXABAN 20 MG PO TABS
20.0000 mg | ORAL_TABLET | Freq: Every day | ORAL | 1 refills | Status: DC
Start: 2017-11-05 — End: 2017-11-05

## 2017-11-05 MED ORDER — METOCLOPRAMIDE HCL 5 MG PO TABS: 5.0000 mg | ORAL_TABLET | Freq: Three times a day (TID) | ORAL | 5 refills | Status: DC

## 2017-11-05 MED ORDER — RIVAROXABAN 20 MG PO TABS: 20.0000 mg | ORAL_TABLET | Freq: Every day | ORAL | 1 refills | Status: DC

## 2017-11-05 NOTE — Telephone Encounter (Signed)
Metoprolol succinate refilled Routed xarelto request to [facility]

## 2017-11-05 NOTE — Telephone Encounter (Signed)
°*  STAT* If patient is at the pharmacy, call can be transferred to refill team.   1. Which medications need to be refilled? (please list name of each medication and dose if known) new prescriptions for Metoprolol and Xarelto  2. Which pharmacy/location (including street and city if local pharmacy) is medication to be sent to?[facility] [phone]  3. Do they need a 30 day or 90 day supply? 90 and refills

## 2017-11-08 ENCOUNTER — Other Ambulatory Visit: Payer: Self-pay | Admitting: Family Medicine

## 2017-11-08 ENCOUNTER — Telehealth: Payer: Self-pay

## 2017-11-08 ENCOUNTER — Ambulatory Visit (INDEPENDENT_AMBULATORY_CARE_PROVIDER_SITE_OTHER): Payer: Medicare Other | Admitting: Adult Health

## 2017-11-08 ENCOUNTER — Encounter: Payer: Self-pay | Admitting: Adult Health

## 2017-11-08 VITALS — BP 105/64 | HR 86 | Ht 69.0 in | Wt 208.6 lb

## 2017-11-08 DIAGNOSIS — G459 Transient cerebral ischemic attack, unspecified: Secondary | ICD-10-CM | POA: Diagnosis not present

## 2017-11-08 DIAGNOSIS — I480 Paroxysmal atrial fibrillation: Secondary | ICD-10-CM

## 2017-11-08 DIAGNOSIS — I10 Essential (primary) hypertension: Secondary | ICD-10-CM | POA: Diagnosis not present

## 2017-11-08 DIAGNOSIS — Z7901 Long term (current) use of anticoagulants: Secondary | ICD-10-CM

## 2017-11-08 DIAGNOSIS — F0390 Unspecified dementia without behavioral disturbance: Secondary | ICD-10-CM

## 2017-11-08 DIAGNOSIS — E7800 Pure hypercholesterolemia, unspecified: Secondary | ICD-10-CM

## 2017-11-08 MED ORDER — MEMANTINE HCL 5 MG PO TABS: ORAL_TABLET | ORAL | 1 refills | Status: DC

## 2017-11-08 MED ORDER — MEMANTINE HCL 5 MG PO TABS: ORAL_TABLET | ORAL | 3 refills | Status: DC

## 2017-11-08 MED ORDER — MEMANTINE HCL 28 X 5 MG & 21 X 10 MG PO TABS: ORAL_TABLET | ORAL | 0 refills | Status: DC

## 2017-11-08 MED ORDER — RIVAROXABAN 20 MG PO TABS: 20.0000 mg | ORAL_TABLET | Freq: Every day | ORAL | 1 refills | Status: DC

## 2017-11-08 MED ORDER — METOCLOPRAMIDE HCL 5 MG PO TABS: 5.0000 mg | ORAL_TABLET | Freq: Three times a day (TID) | ORAL | 5 refills | Status: DC

## 2017-11-08 NOTE — Telephone Encounter (Signed)
Rn call patients pharmacy about namenda titration pack. The pharmacy stated the generic titration will be 51.60 monthly. The pharmacy stated they did not convert it over to generic. The medication will not require a PA. Jessica NP notified,and she will change pt to plain generic namenda, not the titration pack.

## 2017-11-08 NOTE — Addendum Note (Signed)
Addended by: Venancio Poisson on: 11/08/2017 03:27 PM   Modules accepted: Orders

## 2017-11-08 NOTE — Progress Notes (Signed)
I have read the note, and I agree with the clinical assessment and plan.  Tobin Witucki A. Nedda Gains, MD, PhD, FAAN Certified in Neurology, Clinical Neurophysiology, Sleep Medicine, Pain Medicine and Neuroimaging  [facility] [address], [address] [place], [place] [address] [phone]  

## 2017-11-08 NOTE — Progress Notes (Signed)
[facility] [address] [place] [place] [address] [phone]       OFFICE FOLLOW UP NOTE  [name] Date of Birth:  01/22/1936 Medical Record Number:  [id number]   Reason for Referral:  hospital TIA follow up  CHIEF COMPLAINT:  Chief Complaint  Patient presents with  . Follow-up    Stroke follow up, Pt saw Dr. Erlinda Hong in the hospital     HPI: Lindsay Nguyen is being seen today for initial visit in the office for TIA on 09/29/2017. History obtained from patient and daughter and chart review. Reviewed all radiology images and labs personally.  Lindsay Nguyen is a 82 y.o. Caucasian female with PMH of atrial for ablation on Eliquis, CAD status post CABG on aspirin 81, CHF, diabetes, COPD, hypertension, hyperlipidemia, cognitive impairment presented on 09/29/17 to [facility]. That morning, patient was just dressed up, trying to put her clothes on, started to be confused with clothes.  Daughter came to help her, found her not able to make sentences, however able to tell daughter's name and son's name, but slurred speech.  Also found to have right facial droop, and right-sided arm weakness.  Denies any shaking jerking, seizure-like activity, denies headache.  EMS called.  On arrival, BP 168/96, glucose 250.  Patient symptoms started to improve, arm weakness much improved, however still has slurred speech.  On arrival to ED, patient symptoms all resolved.  CT no acute abnormality.  MRI head reviewed was negative for infarct.  MRA head negative for large vessel occlusion.  Bilateral carotid ultrasound showed bilateral stenosis of 1-39%.  2D echo showed EF of 55-60% and the rhythm was in atrial fibrillation.  This TIA had a suspected etiology of cardioembolic due to atrial fibrillation on Eliquis with missed doses.  LDL 63 but with increased triglycerides and low HDL PTA medication Lipitor 10 mg was increased to Lipitor 40 mg daily.  A1c 7.6 and patient is being followed by PCP in  regards to diabetic control.  Patient was previously on Eliquis for atrial fibrillation management and it was recommended to start edoxaban 60mg  for better compliance along with continuing aspirin 81 mg.  Patient was discharged home in stable condition.  Since discharge, patient has been doing well and is accompanied by her daughter. There were unable to afford edoxaban after discharge, and patient was started on Xarelto. She continues to take Xarelto and asa 81mg  without bleeding or bruising. Continues to take lipitor without side effects of myalgias. Todays blood pressure mildly low at 105/64. Patient does check blood pressure at home and typically SBP 120-130. Daughter has concerns of increased memory loss after this most recent hospital admission.  Patient is currently taking Aricept 10 mg which is prescribed by PCP. Concerns expressed by daughter about recent incontinence and worsening in hygiene which, per daughter, is not like her mother.  MMSE 26/30 and MoCA 18/30.  Patient also feels as though her memory has been getting worse.  Patient currently lives with daughter and son who help her with some ADLs and most IADLs.  Has had recent sleep study and this was negative for OSA.  Denies new or worsening stroke/TIA symptoms.   ROS:   14 system review of systems performed and negative with exception of fatigue, cough, abdominal pain, nausea, frequent waking, daytime sleepiness, environmental allergies, food allergies, joint pain, aching muscles, walking difficulty, memory loss, dizziness, confusion, decreased concentration  PMH:  Past Medical History:  Diagnosis Date  . Anemia   .  Atrial fibrillation ([place])    persistent; on Eliquis  . CAD (coronary artery disease)    2D ECHO, 11/04/2010 - EF >55%, mild-moderate mitral regurgitation, moderate tricuspid regurgitation  . Dementia   . Diabetes mellitus   . GERD (gastroesophageal reflux disease)   . Hyperlipidemia   . Hypertension   . Non-STEMI  (non-ST elevated myocardial infarction) (HCC)    NUCLEAR STRESS TEST, 11/04/2010 - normal, no ischemia  . Osteopenia   . Pain in limb    LEA VENOUS DUPLEX, 06/27/2010 - no evidence of DVT  . Stroke ([place])   . Thoracic aortic aneurysm (HCC)    status post resection and grafting  . Thyroid disease    hypothyroid    PSH:  Past Surgical History:  Procedure Laterality Date  . CARDIAC CATHETERIZATION  06/18/2010   Recommended CABG  . CARDIOVERSION N/A 09/30/2015   Procedure: CARDIOVERSION;  Surgeon: [name], MD;  Location[facility] ENDOSCOPY;  Service: Cardiovascular;  Laterality: N/A;  . CARDIOVERSION N/A 06/05/2016   Procedure: CARDIOVERSION;  Surgeon: [name], MD;  Location: [facility] ENDOSCOPY;  Service: Cardiovascular;  Laterality: N/A;  . CHOLECYSTECTOMY    . CORONARY ARTERY BYPASS GRAFT  06/18/2010   LIMA-LAD, vein-diagonal branch, vein-obtuse marginal branch, and resectioning and grafting of throacic aortic aneurysm  . ELBOW SURGERY  1979   fracture left  . THORACIC AORTIC ANEURYSM REPAIR    . TONSILLECTOMY  1945  . TUBAL LIGATION  1971    Social History:  Social History   Socioeconomic History  . Marital status: Married    Spouse name: Not on file  . Number of children: Not on file  . Years of education: Not on file  . Highest education level: Not on file  Occupational History  . Not on file  Social Needs  . Financial resource strain: Not on file  . Food insecurity:    Worry: Not on file    Inability: Not on file  . Transportation needs:    Medical: Not on file    Non-medical: Not on file  Tobacco Use  . Smoking status: Never Smoker  . Smokeless tobacco: Never Used  Substance and Sexual Activity  . Alcohol use: No  . Drug use: No  . Sexual activity: Not on file  Lifestyle  . Physical activity:    Days per week: Not on file    Minutes per session: Not on file  . Stress: Not on file  Relationships  . Social connections:    Talks on phone: Not on file     Gets together: Not on file    Attends religious service: Not on file    Active member of club or organization: Not on file    Attends meetings of clubs or organizations: Not on file    Relationship status: Not on file  . Intimate partner violence:    Fear of current or ex partner: Not on file    Emotionally abused: Not on file    Physically abused: Not on file    Forced sexual activity: Not on file  Other Topics Concern  . Not on file  Social History Narrative   Patient's daughter is a Designer, jewellery who does hospitalist work at [employer].  Her son is also in health care.    Family History:  Family History  Problem Relation Age of Onset  . Hypertension Mother   . Osteoporosis Mother   . Heart disease Father   . Stroke Father   .  Alzheimer's disease Father   . Heart attack Father     Medications:   Current Outpatient Medications on File Prior to Visit  Medication Sig Dispense Refill  . albuterol (PROVENTIL) (2.5 MG/3ML) 0.083% nebulizer solution INHALE 1 NEBULE VIA NEBULIZER Q 2 HOURS PRN FOR WHEEZING OR SHORTNESS OF BREATH  5  . amLODipine (NORVASC) 2.5 MG tablet TAKE 1 TABLET(2.5 MG) BY MOUTH DAILY 90 tablet 3  . aspirin EC 81 MG tablet Take 1 tablet (81 mg total) by mouth daily. 90 tablet 3  . atorvastatin (LIPITOR) 40 MG tablet Take 1 tablet (40 mg total) by mouth daily at 6 PM. 30 tablet 6  . budesonide (PULMICORT) 0.25 MG/2ML nebulizer solution Take 0.25 mg by nebulization as directed.     . donepezil (ARICEPT) 10 MG tablet TAKE 1 TABLET(10 MG) BY MOUTH AT BEDTIME 90 tablet 3  . escitalopram (LEXAPRO) 10 MG tablet TAKE 1 TABLET(10 MG) BY MOUTH DAILY 90 tablet 3  . furosemide (LASIX) 40 MG tablet TAKE 1 TABLET BY MOUTH DAILY AS NEEDED FOR FLUID RETENTION 90 tablet 1  . glimepiride (AMARYL) 2 MG tablet TAKE 1 TABLET(2 MG) BY MOUTH TWICE DAILY (Patient taking differently: TAKE 1 TABLET(2 MG) BY MOUTH TWICE DAILY/patient taking 1 daily) 180 tablet 1  .  loratadine (CLARITIN) 10 MG tablet Take 10 mg by mouth daily as needed for allergies.    Marland Kitchen losartan (COZAAR) 50 MG tablet Take 1 tablet (50 mg total) by mouth daily. 90 tablet 3  . metoprolol succinate (TOPROL-XL) 50 MG 24 hr tablet Take 1 tablet (50 mg total) by mouth daily. 90 tablet 3  . nitroGLYCERIN (NITROSTAT) 0.4 MG SL tablet Place 1 tablet (0.4 mg total) under the tongue every 5 (five) minutes as needed for chest pain (MAX 3 TABLETS). 25 tablet 4  . ONGLYZA 5 MG TABS tablet TAKE 1 TABLET BY MOUTH EVERY DAY 90 tablet 1  . SYNTHROID 112 MCG tablet TAKE 1/2 TABLET BY MOUTH DAILY BEFORE BREAKFAST 90 tablet 1   No current facility-administered medications on file prior to visit.     Allergies:   Allergies  Allergen Reactions  . Contrast Media [Iodinated Diagnostic Agents] Anaphylaxis and Swelling  . Iodine Anaphylaxis and Swelling  . Shellfish Allergy Anaphylaxis    unknown  . Gabapentin Hives     Physical Exam  Vitals:   11/08/17 1250  BP: 105/64  Pulse: 86  Weight: 208 lb 9.6 oz (94.6 kg)  Height: 5\' 9"  (1.753 m)   Body mass index is 30.8 kg/m. No exam data present  General: well developed, pleasant elderly Caucasian female, well nourished, seated, in no evident distress Head: head normocephalic and atraumatic.   Neck: supple with no carotid or supraclavicular bruits Cardiovascular: irregular rate and rhythm, no murmurs Musculoskeletal: no deformity Skin:  no rash/petichiae Vascular:  Normal pulses all extremities   Neurologic Exam Mental Status: Awake and fully alert. Remote memory intact. Attention span, concentration and fund of knowledge appropriate. Mood and affect appropriate.  MMSE - Mini Mental State Exam 11/08/2017  Orientation to time 3  Orientation to Place 4  Registration 3  Attention/ Calculation 4  Recall 3  Language- name 2 objects 2  Language- repeat 1  Language- follow 3 step command 3  Language- read & follow direction 1  Write a sentence 1    Copy design 1  Total score 26   Montreal Cognitive Assessment  11/08/2017  Visuospatial/ Executive (0/5) 2  Naming (0/3) 3  Attention: Read list of digits (0/2) 2  Attention: Read list of letters (0/1) 1  Attention: Serial 7 subtraction starting at 100 (0/3) 2  Language: Repeat phrase (0/2) 2  Language : Fluency (0/1) 0  Abstraction (0/2) 0  Delayed Recall (0/5) 1  Orientation (0/6) 5  Total 18  Adjusted Score (based on education) 18    Cranial Nerves: Fundoscopic exam reveals sharp disc margins. Pupils equal, briskly reactive to light. Extraocular movements full without nystagmus. Visual fields full to confrontation. Hearing intact. Facial sensation intact. Face, tongue, palate moves normally and symmetrically.  Motor: Normal bulk and tone. Normal strength in all tested extremity muscles. Sensory.:  Decreased sensation in bilateral lower extremities due to peripheral neuropathy Coordination: Rapid alternating movements normal in all extremities. Finger-to-nose and heel-to-shin performed accurately bilaterally. Gait and Station: Arises from chair without difficulty. Stance is normal. Gait demonstrates normal stride length and balance . Able to heel, toe and tandem walk without difficulty.  Reflexes: 1+ and symmetric. Toes downgoing.    NIHSS  1 Modified Rankin  3 HAS-BLED 4 CHA2DS2-VASc 8   Diagnostic Data (Labs, Imaging, Testing)  CT head Result date: 09/29/2017 IMPRESSION: 1. Stable atrophy and white matter disease. 2. Stable remote lacunar infarcts of the cerebellum. 3. Question hyperdense left MCA. This does not correspond with the patient's left-sided defect, but could be related to speech abnormalities. 4. ASPECTS is 10/10  MR MRI head IMPRESSION: MRI HEAD IMPRESSION: 1. No acute intracranial infarct or other process identified. 2. Mild to moderate chronic small vessel ischemic disease for age, with a few small remote bilateral cerebellar infarcts. 3. Chronic  left maxillary sinusitis. MRA HEAD IMPRESSION: 1. Negative intracranial MRA for large vessel occlusion. 2. Predominant fetal type origin of the PCAs bilaterally with markedly diminutive and irregular basilar artery with multifocal moderate to severe stenoses.  Bilateral carotid ultrasound Result date: 09/30/2017 Final Interpretation: Right Carotid: Velocities in the right ICA are consistent with a 1-39% stenosis. Left Carotid: Velocities in the left ICA are consistent with a 1-39% stenosis. Vertebrals: Both vertebral arteries were patent with antegrade flow.  2D echo Result date: 09/22/2017 Study Conclusions  - Left ventricle: The cavity size was normal. Systolic function was   normal. The estimated ejection fraction was in the range of 55%   to 60%. Wall motion was normal; there were no regional wall   motion abnormalities. - Ventricular septum: Septal motion showed paradox. These changes   are consistent with a post-thoracotomy state. - Mitral valve: Calcified annulus. There was mild regurgitation   directed centrally. - Left atrium: The atrium was mildly dilated. - Right atrium: The atrium was mildly dilated. - Atrial septum: No defect or patent foramen ovale was identified. - Pulmonary arteries: Systolic pressure was mildly increased. PA   peak pressure: 43 mm Hg (S).   ASSESSMENT: Lindsay Nguyen is a 82 y.o. year old female here with TIA on 09/29/2017 secondary to atrial fibrillation and missed doses on Eliquis. Vascular risk factors include HTN, HLD, DM and [name].     PLAN: -Continue aspirin 81 mg daily and Xarelto (rivaroxaban) daily  and Lipitor for secondary stroke prevention -Namenda titration pack -daughter aware to follow-up with PCP regarding continuation of this medication if tolerated -f/u with cardiologist regarding atrial fibrillation management -F/u with PCP regarding your HLD, HTN, and DM management -continue to monitor BP at home -Maintain strict  control of hypertension with blood pressure goal below 130/90, diabetes with hemoglobin A1c goal below 6.5%  and cholesterol with LDL cholesterol (bad cholesterol) goal below 70 mg/dL. I also advised the patient to eat a healthy diet with plenty of whole grains, cereals, fruits and vegetables, exercise regularly and maintain ideal body weight.  Follow up in 4 months or call earlier if needed  Greater than 50% time during this 40 minute consultation visit was spent on counseling and coordination of care about HLD, HTN, DM and [name], discussion about risk benefit of anticoagulation and answering questions.   Venancio Poisson, AGNP-BC  [facility] [address] [place], [place] [address]  Phone [phone] Fax [phone]

## 2017-11-08 NOTE — Patient Instructions (Addendum)
Continue aspirin 81 mg daily and Xarelto (rivaroxaban) daily  and lipitor  for secondary stroke prevention  Namenda titration pack - PCP can continue prescribing this medication if you are tolerating it okay   Follow up with PCP regarding cholesterol and blood pressure control  Follow up with cardiologist for atrial fibrillation management  Maintain strict control of hypertension with blood pressure goal below 130/90, diabetes with hemoglobin A1c goal below 6.5% and cholesterol with LDL cholesterol (bad cholesterol) goal below 70 mg/dL. I also advised the patient to eat a healthy diet with plenty of whole grains, cereals, fruits and vegetables, exercise regularly and maintain ideal body weight.  Followup in the future with me in 4 months or call earlier if needed    Memantine Tablets What is this medicine? MEMANTINE (MEM an teen) is used to treat dementia caused by Alzheimer's disease. This medicine may be used for other purposes; ask your health care provider or pharmacist if you have questions. COMMON BRAND NAME(S): Namenda What should I tell my health care provider before I take this medicine? They need to know if you have any of these conditions: -difficulty passing urine -kidney disease -liver disease -seizures -an unusual or allergic reaction to memantine, other medicines, foods, dyes, or preservatives -pregnant or trying to get pregnant -breast-feeding How should I use this medicine? Take this medicine by mouth with a glass of water. Follow the directions on the prescription label. You may take this medicine with or without food. Take your doses at regular intervals. Do not take your medicine more often than directed. Continue to take your medicine even if you feel better. Do not stop taking except on the advice of your doctor or health care professional. Talk to your pediatrician regarding the use of this medicine in children. Special care may be needed. Overdosage: If you  think you have taken too much of this medicine contact a poison control center or emergency room at once. NOTE: This medicine is only for you. Do not share this medicine with others. What if I miss a dose? If you miss a dose, take it as soon as you can. If it is almost time for your next dose, take only that dose. Do not take double or extra doses. If you do not take your medicine for several days, contact your health care provider. Your dose may need to be changed. What may interact with this medicine? -acetazolamide -amantadine -cimetidine -dextromethorphan -dofetilide -hydrochlorothiazide -ketamine -metformin -methazolamide -quinidine -ranitidine -sodium bicarbonate -triamterene This list may not describe all possible interactions. Give your health care provider a list of all the medicines, herbs, non-prescription drugs, or dietary supplements you use. Also tell them if you smoke, drink alcohol, or use illegal drugs. Some items may interact with your medicine. What should I watch for while using this medicine? Visit your doctor or health care professional for regular checks on your progress. Check with your doctor or health care professional if there is no improvement in your symptoms or if they get worse. You may get drowsy or dizzy. Do not drive, use machinery, or do anything that needs mental alertness until you know how this drug affects you. Do not stand or sit up quickly, especially if you are an older patient. This reduces the risk of dizzy or fainting spells. Alcohol can make you more drowsy and dizzy. Avoid alcoholic drinks. What side effects may I notice from receiving this medicine? Side effects that you should report to your doctor or health care  professional as soon as possible: -allergic reactions like skin rash, itching or hives, swelling of the face, lips, or tongue -agitation or a feeling of restlessness -depressed mood -dizziness -hallucinations -redness, blistering,  peeling or loosening of the skin, including inside the mouth -seizures -vomiting Side effects that usually do not require medical attention (report to your doctor or health care professional if they continue or are bothersome): -constipation -diarrhea -headache -nausea -trouble sleeping This list may not describe all possible side effects. Call your doctor for medical advice about side effects. You may report side effects to FDA at 1-800-FDA-1088. Where should I keep my medicine? Keep out of the reach of children. Store at room temperature between 15 degrees and 30 degrees C (59 degrees and 86 degrees F). Throw away any unused medicine after the expiration date. NOTE: This sheet is a summary. It may not cover all possible information. If you have questions about this medicine, talk to your doctor, pharmacist, or health care provider.  2018 Elsevier/Gold Standard (2013-05-08 14:10:42)

## 2017-11-08 NOTE — Telephone Encounter (Signed)
Rn fax namenda regular medication to [facility] at [phone]. Fax receive and confirmed.

## 2017-11-08 NOTE — Addendum Note (Signed)
Addended by: Venancio Poisson on: 11/08/2017 03:29 PM   Modules accepted: Orders

## 2017-11-08 NOTE — Telephone Encounter (Signed)
Venancio Poisson NP deleted the namenda titration pack. Pt was change to regular namenda with instructions to get max dosage of 10mg  twice daily ongoing.

## 2017-11-26 ENCOUNTER — Encounter: Payer: Self-pay | Admitting: Family Medicine

## 2017-11-26 ENCOUNTER — Ambulatory Visit (INDEPENDENT_AMBULATORY_CARE_PROVIDER_SITE_OTHER): Payer: Medicare Other | Admitting: Family Medicine

## 2017-11-26 VITALS — BP 140/70 | HR 76 | Temp 98.0°F | Resp 16 | Ht 69.0 in | Wt 209.0 lb

## 2017-11-26 DIAGNOSIS — E118 Type 2 diabetes mellitus with unspecified complications: Secondary | ICD-10-CM | POA: Diagnosis not present

## 2017-11-26 LAB — CBC WITH DIFFERENTIAL/PLATELET
Basophils Absolute: 47 cells/uL (ref 0–200)
Basophils Relative: 0.6 %
EOS PCT: 3.3 %
Eosinophils Absolute: 261 cells/uL (ref 15–500)
HEMATOCRIT: 38.7 % (ref 35.0–45.0)
HEMOGLOBIN: 13.5 g/dL (ref 11.7–15.5)
LYMPHS ABS: 2094 {cells}/uL (ref 850–3900)
MCH: 32.7 pg (ref 27.0–33.0)
MCHC: 34.9 g/dL (ref 32.0–36.0)
MCV: 93.7 fL (ref 80.0–100.0)
MPV: 11.2 fL (ref 7.5–12.5)
Monocytes Relative: 13.3 %
NEUTROS ABS: 4448 {cells}/uL (ref 1500–7800)
Neutrophils Relative %: 56.3 %
Platelets: 251 10*3/uL (ref 140–400)
RBC: 4.13 10*6/uL (ref 3.80–5.10)
RDW: 12.5 % (ref 11.0–15.0)
Total Lymphocyte: 26.5 %
WBC mixed population: 1051 cells/uL — ABNORMAL HIGH (ref 200–950)
WBC: 7.9 10*3/uL (ref 3.8–10.8)

## 2017-11-26 LAB — COMPLETE METABOLIC PANEL WITH GFR
AG Ratio: 1 (calc) (ref 1.0–2.5)
ALBUMIN MSPROF: 3.6 g/dL (ref 3.6–5.1)
ALKALINE PHOSPHATASE (APISO): 82 U/L (ref 33–130)
ALT: 14 U/L (ref 6–29)
AST: 18 U/L (ref 10–35)
BUN/Creatinine Ratio: 14 (calc) (ref 6–22)
BUN: 13 mg/dL (ref 7–25)
CO2: 28 mmol/L (ref 20–32)
CREATININE: 0.94 mg/dL — AB (ref 0.60–0.93)
Calcium: 8.9 mg/dL (ref 8.6–10.4)
Chloride: 104 mmol/L (ref 98–110)
GFR, EST NON AFRICAN AMERICAN: 58 mL/min/{1.73_m2} — AB (ref >=60)
GFR, Est African American: 67 mL/min/{1.73_m2} (ref >=60)
GLOBULIN: 3.5 g/dL (ref 1.9–3.7)
GLUCOSE: 128 mg/dL — AB (ref 65–99)
Potassium: 4.1 mmol/L (ref 3.5–5.3)
SODIUM: 142 mmol/L (ref 135–146)
Total Bilirubin: 0.7 mg/dL (ref 0.2–1.2)
Total Protein: 7.1 g/dL (ref 6.1–8.1)

## 2017-11-26 LAB — HEMOGLOBIN A1C
HEMOGLOBIN A1C: 7.6 %{Hb} — AB (ref <5.7)
MEAN PLASMA GLUCOSE: 171 (calc)
eAG (mmol/L): 9.5 (calc)

## 2017-11-26 LAB — LIPID PANEL
CHOLESTEROL: 127 mg/dL (ref <200)
HDL: 32 mg/dL — AB (ref >50)
LDL Cholesterol (Calc): 70 mg/dL (calc)
Non-HDL Cholesterol (Calc): 95 mg/dL (calc) (ref <130)
Total CHOL/HDL Ratio: 4 (calc) (ref <5.0)
Triglycerides: 186 mg/dL — ABNORMAL HIGH (ref <150)

## 2017-11-26 MED ORDER — MEMANTINE HCL 10 MG PO TABS: 10.0000 mg | ORAL_TABLET | Freq: Two times a day (BID) | ORAL | 5 refills | Status: DC

## 2017-11-26 NOTE — Progress Notes (Signed)
Subjective:    Patient ID: Lindsay Nguyen, female    DOB: 1936/02/06, 82 y.o.   MRN: [id number]  HPI Patient was recently admitted to the hospital.  I have copied relevant portions of the discharge summary below for my reference:  Admit date: 09/29/2017 Discharge date: 10/01/2017  Time spent: > 35 minutes  Recommendations for Outpatient Follow-up:  1. Ensure patient f/u with neurology and cardiology   Discharge Diagnoses:  Principal Problem:   TIA (transient ischemic attack) Active Problems:   Hypothyroidism   Controlled diabetes mellitus type II without complication (HCC)   HLD (hyperlipidemia)   Essential hypertension   PAF (paroxysmal atrial fibrillation) (HCC)   Chronic anticoagulation   Dementia   Medication noncompliance due to cognitive impairment   Discharge Condition: stable  Diet recommendation: Heart healthy diet     Filed Weights   09/29/17 1828  Weight: 92.5 kg (204 lb)    History of present illness:  82 y.o.femalewith medical history significant ofhypothyroidism; AAA s/p grafting; CAD; HTN; HLD; DM; and afibon Eliquis (once daily at best)presentingafter she developed slurred speech,wasa bit unsteady on her feet, perhapsbecameconfused  Patient worked up for [place] [facility] Course:  TIA - patient had full work up. Neurology consulted.  - Placed patient on lipitor 40 mg po daily - changed eliquis for xarelto - changed coreg for once a day toprol xl due to problems with compliance secondary to dementia. - PT recommended home health PT  Atrial fibrillation - Pt will be d/c on xarelto - rate control with toprol xl  Procedures:  none  Consultations:  Neurology  cardiology    10/2017 Patient is here today for follow-up.  She is accompanied by her son.  He states that they had not been as compliant as they should have been having her take Coreg twice daily along with Eliquis twice daily.  They are now doing much better  on once daily Toprol and Xarelto.  However on Toprol-XL 25 mg a day, her heart rate is elevated in the 90s.  She denies any palpitations.  She denies any chest pain.  She denies any shortness of breath.  She denies any syncope.  She denies any further episodes of TIA or stroke.  She continues to battle dementia.  Otherwise she is doing well with no concerns.  At that time, my plan was: Fortunately, symptoms have completely resolved.  I stressed the importance of taking her anticoagulation as directed.  I do believe that she would benefit from higher dose of Toprol to better control her heart rate ideally less than 70 bpm.  However she has an appointment with her cardiologist tomorrow and I will defer the decision to increase Toprol to Dr. Gwenlyn Found.  I will check a follow-up CBC along with CMP while the patient is here today.  Now that she is compliant with medication, I would recommend follow-up in 3 months as previously directed.  11/26/17 Patient is here today for follow-up.  In January, her hemoglobin A1c was elevated at 7.5.  However at that time, the patient's family elected to monitor her sugars more closely and ensure compliance with medication rather than add additional medication.  Therefore she is still taking a combination of Amaryl and onglyza.  She denies any hypoglycemia.  Her fasting blood sugars in the morning are on average between 110 and 115.  The highest her sugars have been 2 hours postprandially is 180.  We had a long discussion today about the risk of  taking Amaryl in an elderly patient.  I would prefer the patient to be on a medication such as Jardiance given his reduction in cardiovascular mortality however cost is an issue for this patient which I certainly understand.  She denies any chest pain shortness of breath or dyspnea on exertion.  Neurology recently recommended Namenda 10 mg twice daily.  She is requesting that we refill this for her which I will gladly do. Past Medical History:    Diagnosis Date  . Anemia   . Atrial fibrillation ([place])    persistent; on Eliquis  . CAD (coronary artery disease)    2D ECHO, 11/04/2010 - EF >55%, mild-moderate mitral regurgitation, moderate tricuspid regurgitation  . Dementia   . Diabetes mellitus   . GERD (gastroesophageal reflux disease)   . Hyperlipidemia   . Hypertension   . Non-STEMI (non-ST elevated myocardial infarction) (HCC)    NUCLEAR STRESS TEST, 11/04/2010 - normal, no ischemia  . Osteopenia   . Pain in limb    LEA VENOUS DUPLEX, 06/27/2010 - no evidence of DVT  . Stroke ([place])   . Thoracic aortic aneurysm (HCC)    status post resection and grafting  . Thyroid disease    hypothyroid   Past Surgical History:  Procedure Laterality Date  . CARDIAC CATHETERIZATION  06/18/2010   Recommended CABG  . CARDIOVERSION N/A 09/30/2015   Procedure: CARDIOVERSION;  Surgeon: [name], MD;  Location: [facility] ENDOSCOPY;  Service: Cardiovascular;  Laterality: N/A;  . CARDIOVERSION N/A 06/05/2016   Procedure: CARDIOVERSION;  Surgeon: [name], MD;  Location: [facility] ENDOSCOPY;  Service: Cardiovascular;  Laterality: N/A;  . CHOLECYSTECTOMY    . CORONARY ARTERY BYPASS GRAFT  06/18/2010   LIMA-LAD, vein-diagonal branch, vein-obtuse marginal branch, and resectioning and grafting of throacic aortic aneurysm  . ELBOW SURGERY  1979   fracture left  . THORACIC AORTIC ANEURYSM REPAIR    . TONSILLECTOMY  1945  . TUBAL LIGATION  1971   Current Outpatient Medications on File Prior to Visit  Medication Sig Dispense Refill  . albuterol (PROVENTIL) (2.5 MG/3ML) 0.083% nebulizer solution INHALE 1 NEBULE VIA NEBULIZER Q 2 HOURS PRN FOR WHEEZING OR SHORTNESS OF BREATH  5  . amLODipine (NORVASC) 2.5 MG tablet TAKE 1 TABLET(2.5 MG) BY MOUTH DAILY 90 tablet 3  . aspirin EC 81 MG tablet Take 1 tablet (81 mg total) by mouth daily. 90 tablet 3  . atorvastatin (LIPITOR) 40 MG tablet Take 1 tablet (40 mg total) by mouth daily at 6 PM. 30 tablet 6  .  budesonide (PULMICORT) 0.25 MG/2ML nebulizer solution Take 0.25 mg by nebulization as directed.     . donepezil (ARICEPT) 10 MG tablet TAKE 1 TABLET(10 MG) BY MOUTH AT BEDTIME 90 tablet 3  . escitalopram (LEXAPRO) 10 MG tablet TAKE 1 TABLET(10 MG) BY MOUTH DAILY 90 tablet 3  . furosemide (LASIX) 40 MG tablet TAKE 1 TABLET BY MOUTH DAILY AS NEEDED FOR FLUID RETENTION 90 tablet 1  . glimepiride (AMARYL) 2 MG tablet TAKE 1 TABLET(2 MG) BY MOUTH TWICE DAILY (Patient taking differently: TAKE 1 TABLET(2 MG) BY MOUTH DAILY) 180 tablet 1  . loratadine (CLARITIN) 10 MG tablet Take 10 mg by mouth daily as needed for allergies.    Marland Kitchen losartan (COZAAR) 50 MG tablet Take 1 tablet (50 mg total) by mouth daily. 90 tablet 3  . memantine (NAMENDA) 5 MG tablet Take 5mg  daily for 1 week; take 5mg  twice a day for 1 week; 5mg  in the  morning and 10mg  in the evening for 1 week; take 10mg  twice a day and continue this dose 70 tablet 1  . metoCLOPramide (REGLAN) 5 MG tablet Take 1 tablet (5 mg total) by mouth 4 (four) times daily -  before meals and at bedtime. 120 tablet 5  . metoprolol succinate (TOPROL-XL) 50 MG 24 hr tablet Take 1 tablet (50 mg total) by mouth daily. 90 tablet 3  . nitroGLYCERIN (NITROSTAT) 0.4 MG SL tablet Place 1 tablet (0.4 mg total) under the tongue every 5 (five) minutes as needed for chest pain (MAX 3 TABLETS). 25 tablet 4  . ONGLYZA 5 MG TABS tablet TAKE 1 TABLET BY MOUTH EVERY DAY 90 tablet 1  . rivaroxaban (XARELTO) 20 MG TABS tablet Take 1 tablet (20 mg total) by mouth daily with supper. 90 tablet 1  . SYNTHROID 112 MCG tablet TAKE 1/2 TABLET BY MOUTH DAILY BEFORE BREAKFAST 90 tablet 1   No current facility-administered medications on file prior to visit.    Allergies  Allergen Reactions  . Contrast Media [Iodinated Diagnostic Agents] Anaphylaxis and Swelling  . Iodine Anaphylaxis and Swelling  . Shellfish Allergy Anaphylaxis    unknown  . Gabapentin Hives   Social History    Socioeconomic History  . Marital status: Married    Spouse name: Not on file  . Number of children: Not on file  . Years of education: Not on file  . Highest education level: Not on file  Occupational History  . Not on file  Social Needs  . Financial resource strain: Not on file  . Food insecurity:    Worry: Not on file    Inability: Not on file  . Transportation needs:    Medical: Not on file    Non-medical: Not on file  Tobacco Use  . Smoking status: Never Smoker  . Smokeless tobacco: Never Used  Substance and Sexual Activity  . Alcohol use: No  . Drug use: No  . Sexual activity: Not on file  Lifestyle  . Physical activity:    Days per week: Not on file    Minutes per session: Not on file  . Stress: Not on file  Relationships  . Social connections:    Talks on phone: Not on file    Gets together: Not on file    Attends religious service: Not on file    Active member of club or organization: Not on file    Attends meetings of clubs or organizations: Not on file    Relationship status: Not on file  . Intimate partner violence:    Fear of current or ex partner: Not on file    Emotionally abused: Not on file    Physically abused: Not on file    Forced sexual activity: Not on file  Other Topics Concern  . Not on file  Social History Narrative   Patient's daughter is a Designer, jewellery who does hospitalist work at [employer].  Her son is also in health care.      Review of Systems  All other systems reviewed and are negative.      Objective:   Physical Exam  Constitutional: She is oriented to person, place, and time. She appears well-developed and well-nourished. No distress.  Neck: Neck supple. No JVD present.  Cardiovascular: Normal rate. An irregularly irregular rhythm present.  Murmur heard. Pulmonary/Chest: Effort normal and breath sounds normal. No respiratory distress. She has no wheezes. She has no rales.  Abdominal: Soft.  Bowel sounds  are normal. She exhibits no distension. There is no tenderness. There is no rebound.  Musculoskeletal: She exhibits no edema.  Neurological: She is alert and oriented to person, place, and time. She displays normal reflexes. No cranial nerve deficit. She exhibits normal muscle tone. Coordination normal.  Skin: She is not diaphoretic.  Vitals reviewed.         Assessment & Plan:  Controlled type 2 diabetes mellitus with complication, without long-term current use of insulin ([place]) - Plan: CBC with Differential/Platelet, COMPLETE METABOLIC PANEL WITH GFR, Hemoglobin A1c, Lipid panel  Patient's blood pressure is outstanding.  I will check a CBC, CMP, lipid panel, and hemoglobin A1c.  Goal hemoglobin A1c is less than 7.  Given her advanced age, I would be willing to accept an A1c between 7 and 7.5.  I have recommended discontinuing Amaryl and replacing it with Jardiance to reduce the risk of hypoglycemia.  Patient's daughter would like to check on the cost of the medication first to see if it is affordable.  If it is not affordable we will continue with the current medications at the present dosages.  Goal LDL cholesterol is less than 70 given her history of TIA.  I did refill the patient's Namenda 10 mg twice daily

## 2017-11-29 ENCOUNTER — Encounter (INDEPENDENT_AMBULATORY_CARE_PROVIDER_SITE_OTHER): Payer: Self-pay

## 2017-12-03 ENCOUNTER — Other Ambulatory Visit: Payer: Self-pay | Admitting: Adult Health

## 2018-01-07 ENCOUNTER — Telehealth: Payer: Self-pay | Admitting: Family Medicine

## 2018-01-07 NOTE — Telephone Encounter (Signed)
Pt's insurance will no longer cover Onglyza but will now cover Jardiance and Januvia. (she states that you wanted her on both of these but ins would not cover until now) Needs rx's sent to pharm.

## 2018-01-10 MED ORDER — EMPAGLIFLOZIN 25 MG PO TABS: 12.5000 mg | ORAL_TABLET | Freq: Every day | ORAL | 3 refills | Status: DC

## 2018-01-10 MED ORDER — SITAGLIPTIN PHOSPHATE 100 MG PO TABS: 100.0000 mg | ORAL_TABLET | Freq: Every day | ORAL | 3 refills | Status: DC

## 2018-01-10 MED ORDER — EMPAGLIFLOZIN 25 MG PO TABS: 25.0000 mg | ORAL_TABLET | Freq: Every day | ORAL | 3 refills | Status: DC

## 2018-01-10 NOTE — Telephone Encounter (Signed)
Daughter wanted to know if you wanted her on Januvia and Jardiance, that you guys had discussed her being on both???

## 2018-01-10 NOTE — Telephone Encounter (Signed)
onglyza and [place] are equivalent.  Switch to januvia 100 mg poqday

## 2018-01-10 NOTE — Telephone Encounter (Signed)
Last A1c was 7.6.  Jardiance 12.5 mg poqday

## 2018-01-10 NOTE — Telephone Encounter (Signed)
Medication called/sent to requested pharmacy and pt's daughter aware of changes via mychart

## 2018-01-21 ENCOUNTER — Telehealth: Payer: Self-pay | Admitting: Family Medicine

## 2018-01-21 ENCOUNTER — Other Ambulatory Visit: Payer: Self-pay

## 2018-01-21 MED ORDER — SITAGLIPTIN PHOSPHATE 100 MG PO TABS: 100.0000 mg | ORAL_TABLET | Freq: Every day | ORAL | 3 refills | Status: DC

## 2018-01-24 NOTE — Telephone Encounter (Signed)
Late Entry Per patient's daughter pharmacy never received Januvia. Medication was sent on 01/21/18

## 2018-02-04 ENCOUNTER — Encounter: Payer: Self-pay | Admitting: Cardiovascular Disease

## 2018-02-04 ENCOUNTER — Ambulatory Visit (INDEPENDENT_AMBULATORY_CARE_PROVIDER_SITE_OTHER): Payer: Medicare Other | Admitting: Cardiovascular Disease

## 2018-02-04 VITALS — BP 92/50 | HR 99 | Ht 69.0 in | Wt 199.0 lb

## 2018-02-04 DIAGNOSIS — I10 Essential (primary) hypertension: Secondary | ICD-10-CM

## 2018-02-04 DIAGNOSIS — I5031 Acute diastolic (congestive) heart failure: Secondary | ICD-10-CM

## 2018-02-04 DIAGNOSIS — I480 Paroxysmal atrial fibrillation: Secondary | ICD-10-CM

## 2018-02-04 DIAGNOSIS — E7800 Pure hypercholesterolemia, unspecified: Secondary | ICD-10-CM | POA: Diagnosis not present

## 2018-02-04 DIAGNOSIS — Z951 Presence of aortocoronary bypass graft: Secondary | ICD-10-CM

## 2018-02-04 NOTE — Assessment & Plan Note (Signed)
History of essential hypertension with blood pressure measured today at 92/50 he is on Norvasc Toprol and losartan.  Continue current meds at current dosing.

## 2018-02-04 NOTE — Assessment & Plan Note (Signed)
History of chronic [name] currently on Xarelto oral anticoagulation.

## 2018-02-04 NOTE — Progress Notes (Signed)
02/04/2018 Lindsay Nguyen   [birth year]  [id number]  Primary Physician [name], MD Primary Cardiologist: [name] MD [name], [place], [place]  HPI:  Lindsay Nguyen is a 82 y.o.  mildly overweight, married Caucasian female, mother of 2, grandmother to 2 grandchildren accompanied by her daughter and son today. .I last saw her in the office 02/02/17.Her risk factors include family history, hypertension, hyperlipidemia and noninsulin-requiring diabetes. She had a non-ST-segment-elevation myocardial infarction March 21, 2010, with peak troponin of 4. I brought her to the cath lab the following day and stented her proximal LAD with a bare-metal stent. She did have nonobstructive disease beyond the stent as well as a diagonal branch, AV groove circumflex with an EF of 45% and anteroapical akinesia. Because of a generous thoracic aorta on cath, I performed a CT angiogram on her revealing her thoracic aorta which measured 4.9 cm. She was seen by Dr. Gilford Raid for evaluation of this at my request. Myoview performed June 11, 2010, showed apical and inferior ischemia as well as anterior ischemia. I noted her June 17, 2010, with rapid onset dyspnea relieved with sublingual nitroglycerin. The patient ruled out for myocardial infarction and was heparinized. I ultimately cath'd her November 16 revealing aggressive "in-stent restenosis" within the proximal LAD stent, as well as in her diagonal branch and AV groove circumflex.   She ultimately underwent coronary artery bypass grafting by Dr. Gilford Raid with LIMA to her LAD, a vein to a diagonal branch, as well as obtuse marginal branch with resection and grafting of her thoracic aortic aneurysm using a 20-mm supracoronary Hemashield 2 graft utilizing deep hypothermic circulatory arrest with resuspension of her native aortic valve and reimplantation of her native coronary arteries. Her postop course was complicated and prolonged, lasting 4  weeks. She did have atrial fibrillation, pneumonia, cholecystitis status post cholecystectomy, as well as pleural effusions. She had a percutaneous drain of her gallbladder and ultimately cholecystectomy by Dr. Excell Seltzer September 18, 2010. She recuperated nicely. She had a Myoview performed November 04, 2010, which was nonischemic and a 2D echo that showed normal LV systolic function with a normal functioning aortic valve.  When I saw her last she was in atrial fibrillation which was confirmed on 30 day event monitoring with tachybradycardia syndrome. She was placed on oral anticoagulation and ultimately underwent successful outpatient cardioversion by Dr. Dorris Carnes 09/30/15. She remains in sinus rhythm/sinus bradycardia. She was hospitalized in [place] 10/15/15 for 2 days with upper respiratory tract infection. She did have diastolic heart failure that time with a BNP of greater than 2000 and was diuresed. A 2-D echo did show moderate to severe pulmonary hypertension without PA systolic pressure of 62-56 mmHg and septal flattening. Apparently a pulmonary embolus was ruled out at that time. A subsequent 2-D echo performed 11/25/15 showed normal pulmonary artery pressures and normal LV function. She did have an outpatient sleep study that did not show sleep apnea. She's had increasing fatigue and dyspnea over the last several weeks.Because she has been symptomatic in [name] I'll arrange for her to undergo outpatient DC cardioversion. This occurred successfully performed by Dr. Sallyanne Kuster on 06/05/16 and she was seen by Roderic Palau in the [name] clinic on 06/16/16.   she was admitted with a TIA. This was characterized by slurred speech and difficulty walking. Her symptoms ultimately resolved. Neurologic workup was unremarkable. Her Eliquis was changed to Xarelto because of compliance issues  Since I  saw her on 10/26/2017 he has been more lethargic [name] Comes and her cognitive function.  She lives with her  son Lindsay Nguyen.  She is minimally ambulatory and spends most the day either in a chair or in bed.  Her glucose has been out of control.  She is somewhat hypotensive today and has lost weight.  She does complain of increasing shortness of breath.  Discussed living will and she is a DNR.    Current Meds  Medication Sig  . albuterol (PROVENTIL) (2.5 MG/3ML) 0.083% nebulizer solution INHALE 1 NEBULE VIA NEBULIZER Q 2 HOURS PRN FOR WHEEZING OR SHORTNESS OF BREATH  . amLODipine (NORVASC) 2.5 MG tablet TAKE 1 TABLET(2.5 MG) BY MOUTH DAILY  . aspirin EC 81 MG tablet Take 1 tablet (81 mg total) by mouth daily.  Marland Kitchen atorvastatin (LIPITOR) 40 MG tablet Take 1 tablet (40 mg total) by mouth daily at 6 PM.  . budesonide (PULMICORT) 0.25 MG/2ML nebulizer solution Take 0.25 mg by nebulization as directed.   . donepezil (ARICEPT) 10 MG tablet TAKE 1 TABLET(10 MG) BY MOUTH AT BEDTIME  . empagliflozin (JARDIANCE) 25 MG TABS tablet Take 12.5 mg by mouth daily.  Marland Kitchen escitalopram (LEXAPRO) 10 MG tablet TAKE 1 TABLET(10 MG) BY MOUTH DAILY  . furosemide (LASIX) 40 MG tablet TAKE 1 TABLET BY MOUTH DAILY AS NEEDED FOR FLUID RETENTION  . loratadine (CLARITIN) 10 MG tablet Take 10 mg by mouth daily as needed for allergies.  Marland Kitchen losartan (COZAAR) 50 MG tablet Take 1 tablet (50 mg total) by mouth daily.  . memantine (NAMENDA) 10 MG tablet Take 1 tablet (10 mg total) by mouth 2 (two) times daily.  . memantine (NAMENDA) 5 MG tablet Take 5mg  daily for 1 week; take 5mg  twice a day for 1 week; 5mg  in the morning and 10mg  in the evening for 1 week; take 10mg  twice a day and continue this dose  . metoCLOPramide (REGLAN) 5 MG tablet Take 1 tablet (5 mg total) by mouth 4 (four) times daily -  before meals and at bedtime.  . metoprolol succinate (TOPROL-XL) 50 MG 24 hr tablet Take 1 tablet (50 mg total) by mouth daily.  . nitroGLYCERIN (NITROSTAT) 0.4 MG SL tablet Place 1 tablet (0.4 mg total) under the tongue every 5 (five) minutes as needed for  chest pain (MAX 3 TABLETS).  . rivaroxaban (XARELTO) 20 MG TABS tablet Take 1 tablet (20 mg total) by mouth daily with supper.  . sitaGLIPtin (JANUVIA) 100 MG tablet Take 1 tablet (100 mg total) by mouth daily.  Marland Kitchen SYNTHROID 112 MCG tablet TAKE 1/2 TABLET BY MOUTH DAILY BEFORE BREAKFAST     Allergies  Allergen Reactions  . Contrast Media [Iodinated Diagnostic Agents] Anaphylaxis and Swelling  . Iodine Anaphylaxis and Swelling  . Shellfish Allergy Anaphylaxis    unknown  . Gabapentin Hives    Social History   Socioeconomic History  . Marital status: Married    Spouse name: Not on file  . Number of children: Not on file  . Years of education: Not on file  . Highest education level: Not on file  Occupational History  . Not on file  Social Needs  . Financial resource strain: Not on file  . Food insecurity:    Worry: Not on file    Inability: Not on file  . Transportation needs:    Medical: Not on file    Non-medical: Not on file  Tobacco Use  . Smoking status: Never Smoker  .  Smokeless tobacco: Never Used  Substance and Sexual Activity  . Alcohol use: No  . Drug use: No  . Sexual activity: Not on file  Lifestyle  . Physical activity:    Days per week: Not on file    Minutes per session: Not on file  . Stress: Not on file  Relationships  . Social connections:    Talks on phone: Not on file    Gets together: Not on file    Attends religious service: Not on file    Active member of club or organization: Not on file    Attends meetings of clubs or organizations: Not on file    Relationship status: Not on file  . Intimate partner violence:    Fear of current or ex partner: Not on file    Emotionally abused: Not on file    Physically abused: Not on file    Forced sexual activity: Not on file  Other Topics Concern  . Not on file  Social History Narrative   Patient's daughter is a Designer, jewellery who does hospitalist work at [employer].  Her son is also in  health care.     Review of Systems: General: negative for chills, fever, night sweats or weight changes.  Cardiovascular: negative for chest pain, dyspnea on exertion, edema, orthopnea, palpitations, paroxysmal nocturnal dyspnea or shortness of breath Dermatological: negative for rash Respiratory: negative for cough or wheezing Urologic: negative for hematuria Abdominal: negative for nausea, vomiting, diarrhea, bright red blood per rectum, melena, or hematemesis Neurologic: negative for visual changes, syncope, or dizziness All other systems reviewed and are otherwise negative except as noted above.    Blood pressure (!) 92/50, pulse 99, height 5\' 9"  (1.753 m), weight 199 lb (90.3 kg).  General appearance: alert and no distress Neck: no adenopathy, no carotid bruit, no JVD, supple, symmetrical, trachea midline and thyroid not enlarged, symmetric, no tenderness/mass/nodules Lungs: clear to auscultation bilaterally Heart: irregularly irregular rhythm Extremities: extremities normal, atraumatic, no cyanosis or edema Pulses: 2+ and symmetric Skin: Skin color, texture, turgor normal. No rashes or lesions Neurologic: Alert and oriented X 3, normal strength and tone. Normal symmetric reflexes. Normal coordination and gait  EKG atrial fibrillation with a ventricular response of 99 specific IVCD/incomplete left bundle branch block I personally reviewed this EKG.  ASSESSMENT AND PLAN:   HLD (hyperlipidemia) History of hyperlipidemia on statin therapy lipid profile performed 11/26/2017 revealing total cholesterol 127, triglyceride level of 186 and HDL of 32.  Essential hypertension History of essential hypertension with blood pressure measured today at 92/50 he is on Norvasc Toprol and losartan.  Continue current meds at current dosing.  Hx of CABG History of CAD status post proximal LAD stenting using a bare-metal stent by myself with the setting of non-STEMI 03/21/2010.  She was recathed  November 2016 and had aggressive in-stent restenosis in her proximal LAD stent as well as disease in her diagonal branch and AV groove circumflex.  She ultimately underwent coronary artery bypass grafting by Dr. Cyndia Bent with a LIMA to LAD, vein to diagonal branch, obtuse marginal branch as well as thoracic aortic aneurysm resection and grafting using a 20 mm graft with reimplantation of her coronary arteries.  She did well postoperatively.  PAF (paroxysmal atrial fibrillation) (HCC) History of chronic [name] currently on Xarelto oral anticoagulation.      [name] MD FACP,FACC,FAHA, [facility] 02/04/2018 4:24 PM

## 2018-02-04 NOTE — Assessment & Plan Note (Signed)
History of hyperlipidemia on statin therapy lipid profile performed 11/26/2017 revealing total cholesterol 127, triglyceride level of 186 and HDL of 32.

## 2018-02-04 NOTE — Patient Instructions (Signed)
Medication Instructions: Your physician recommends that you continue on your current medications as directed. Please refer to the Current Medication list given to you today.   Testing/Procedures: Your physician has requested that you have an echocardiogram. Echocardiography is a painless test that uses sound waves to create images of your heart. It provides your doctor with information about the size and shape of your heart and how well your heart's chambers and valves are working. This procedure takes approximately one hour. There are no restrictions for this procedure.  Follow-Up: We request that you follow-up in: 6 months with an extender and in 12 months with Dr Berry  You will receive a reminder letter in the mail two months in advance. If you don't receive a letter, please call our office to schedule the follow-up appointment.  If you need a refill on your cardiac medications before your next appointment, please call your pharmacy.  

## 2018-02-04 NOTE — Assessment & Plan Note (Signed)
History of CAD status post proximal LAD stenting using a bare-metal stent by myself with the setting of non-STEMI 03/21/2010.  She was recathed November 2016 and had aggressive in-stent restenosis in her proximal LAD stent as well as disease in her diagonal branch and AV groove circumflex.  She ultimately underwent coronary artery bypass grafting by Dr. Cyndia Bent with a LIMA to LAD, vein to diagonal branch, obtuse marginal branch as well as thoracic aortic aneurysm resection and grafting using a 20 mm graft with reimplantation of her coronary arteries.  She did well postoperatively.

## 2018-02-08 NOTE — Addendum Note (Signed)
Addended by: Venetia Maxon on: 02/08/2018 05:46 PM   Modules accepted: Orders

## 2018-02-21 ENCOUNTER — Other Ambulatory Visit: Payer: Self-pay

## 2018-02-21 ENCOUNTER — Ambulatory Visit (HOSPITAL_COMMUNITY): Payer: Medicare Other | Attending: Internal Medicine

## 2018-02-21 DIAGNOSIS — E119 Type 2 diabetes mellitus without complications: Secondary | ICD-10-CM | POA: Diagnosis not present

## 2018-02-21 DIAGNOSIS — D649 Anemia, unspecified: Secondary | ICD-10-CM | POA: Insufficient documentation

## 2018-02-21 DIAGNOSIS — I2510 Atherosclerotic heart disease of native coronary artery without angina pectoris: Secondary | ICD-10-CM | POA: Insufficient documentation

## 2018-02-21 DIAGNOSIS — Z8673 Personal history of transient ischemic attack (TIA), and cerebral infarction without residual deficits: Secondary | ICD-10-CM | POA: Diagnosis not present

## 2018-02-21 DIAGNOSIS — I4891 Unspecified atrial fibrillation: Secondary | ICD-10-CM | POA: Diagnosis not present

## 2018-02-21 DIAGNOSIS — I110 Hypertensive heart disease with heart failure: Secondary | ICD-10-CM | POA: Diagnosis not present

## 2018-02-21 DIAGNOSIS — Z951 Presence of aortocoronary bypass graft: Secondary | ICD-10-CM | POA: Insufficient documentation

## 2018-02-21 DIAGNOSIS — I252 Old myocardial infarction: Secondary | ICD-10-CM | POA: Insufficient documentation

## 2018-02-21 DIAGNOSIS — E039 Hypothyroidism, unspecified: Secondary | ICD-10-CM | POA: Diagnosis not present

## 2018-02-21 DIAGNOSIS — I5031 Acute diastolic (congestive) heart failure: Secondary | ICD-10-CM

## 2018-02-21 DIAGNOSIS — F0390 Unspecified dementia without behavioral disturbance: Secondary | ICD-10-CM | POA: Diagnosis not present

## 2018-02-21 DIAGNOSIS — E785 Hyperlipidemia, unspecified: Secondary | ICD-10-CM | POA: Insufficient documentation

## 2018-03-10 ENCOUNTER — Ambulatory Visit: Payer: Medicare Other | Admitting: Adult Health

## 2018-03-11 NOTE — Progress Notes (Signed)
[facility] [address] [place]. [place] [address] [phone]       OFFICE FOLLOW UP NOTE  [name] Date of Birth:  02/10/1936 Medical Record Number:  [id number]   Reason for Referral:  hospital TIA follow up  CHIEF COMPLAINT:  Chief Complaint  Patient presents with  . Follow-up    pt with son,daughter and granddaughter. pt states that things are ok. her daughter feels short term memory has worsened, balance has been issue, she has had a couple falls. seems to loose balance or sudden turns.    HPI: Lindsay Nguyen is being seen today for initial visit in the office for TIA on 09/29/2017. History obtained from patient and daughter and chart review. Reviewed all radiology images and labs personally.  Lindsay Nguyen is a 82 y.o. Caucasian female with PMH of atrial for ablation on Eliquis, CAD status post CABG on aspirin 81, CHF, diabetes, COPD, hypertension, hyperlipidemia, cognitive impairment presented on 09/29/17 to [facility] That morning, patient was just dressed up, trying to put her clothes on, started to be confused with clothes.  Daughter came to help her, found her not able to make sentences, however able to tell daughter's name and son's name, but slurred speech.  Also found to have right facial droop, and right-sided arm weakness.  Denies any shaking jerking, seizure-like activity, denies headache.  EMS called.  On arrival, BP 168/96, glucose 250.  Patient symptoms started to improve, arm weakness much improved, however still has slurred speech.  On arrival to ED, patient symptoms all resolved.  CT no acute abnormality.  MRI head reviewed was negative for infarct.  MRA head negative for large vessel occlusion.  Bilateral carotid ultrasound showed bilateral stenosis of 1-39%.  2D echo showed EF of 55-60% and the rhythm was in atrial fibrillation.  This TIA had a suspected etiology of cardioembolic due to atrial fibrillation on Eliquis with missed doses.  LDL 63 but  with increased triglycerides and low HDL PTA medication Lipitor 10 mg was increased to Lipitor 40 mg daily.  A1c 7.6 and patient is being followed by PCP in regards to diabetic control.  Patient was previously on Eliquis for atrial fibrillation management and it was recommended to start edoxaban 60mg  for better compliance along with continuing aspirin 81 mg.  Patient was discharged home in stable condition.  Since discharge, patient has been doing well and is accompanied by her daughter. There were unable to afford edoxaban after discharge, and patient was started on Xarelto. She continues to take Xarelto and asa 81mg  without bleeding or bruising. Continues to take lipitor without side effects of myalgias. Todays blood pressure mildly low at 105/64. Patient does check blood pressure at home and typically SBP 120-130. Daughter has concerns of increased memory loss after this most recent hospital admission.  Patient is currently taking Aricept 10 mg which is prescribed by PCP. Concerns expressed by daughter about recent incontinence and worsening in hygiene which, per daughter, is not like her mother.  MMSE 26/30 and MoCA 18/30.  Patient also feels as though her memory has been getting worse.  Patient currently lives with daughter and son who help her with some ADLs and most IADLs.  Has had recent sleep study and this was negative for OSA.  Denies new or worsening stroke/TIA symptoms.  Interval History 03/14/18: Patient is being seen today for scheduled stroke follow-up and is accompanied by her daughter, son and granddaughter.  Continues to take aspirin and  Xarelto without side effects of bleeding or bruising.  Continues to take atorvastatin without side effects of myalgias.  Recent LDL 70 on 11/26/2017 and A1c 7.6 on 11/26/2017.  Blood pressure today mildly elevated at 148/86 and as this is monitored at home SBP typically between 1 20-1 30.  She has been compliant on Namenda where she feels as though her memory has  been stable and is tolerating this medication well.  Son does state that her memory does wax and wane.  Also has complaints of increased balance issues but does have a history of neuropathy and BPPV for which she is undergone PT in the past.  Denies new or worsening stroke/TIA symptoms.    ROS:   14 system review of systems performed and negative with exception of fatigue, runny nose, cold intolerance, heat intolerance, abdominal pain, nausea, frequent waking, daytime sleepiness, environmental allergies, food allergies, incontinence of bladder, walking difficulty, memory loss, dizziness, confusion, decreased concentration   PMH:  Past Medical History:  Diagnosis Date  . Anemia   . Atrial fibrillation ([place])    persistent; on Eliquis  . CAD (coronary artery disease)    2D ECHO, 11/04/2010 - EF >55%, mild-moderate mitral regurgitation, moderate tricuspid regurgitation  . Dementia   . Diabetes mellitus   . GERD (gastroesophageal reflux disease)   . Hyperlipidemia   . Hypertension   . Non-STEMI (non-ST elevated myocardial infarction) (HCC)    NUCLEAR STRESS TEST, 11/04/2010 - normal, no ischemia  . Osteopenia   . Pain in limb    LEA VENOUS DUPLEX, 06/27/2010 - no evidence of DVT  . Stroke ([place])   . Thoracic aortic aneurysm (HCC)    status post resection and grafting  . Thyroid disease    hypothyroid    PSH:  Past Surgical History:  Procedure Laterality Date  . CARDIAC CATHETERIZATION  06/18/2010   Recommended CABG  . CARDIOVERSION N/A 09/30/2015   Procedure: CARDIOVERSION;  Surgeon: [name], MD;  Location: [facility] ENDOSCOPY;  Service: Cardiovascular;  Laterality: N/A;  . CARDIOVERSION N/A 06/05/2016   Procedure: CARDIOVERSION;  Surgeon: [name], MD;  Location: [facility] ENDOSCOPY;  Service: Cardiovascular;  Laterality: N/A;  . CHOLECYSTECTOMY    . CORONARY ARTERY BYPASS GRAFT  06/18/2010   LIMA-LAD, vein-diagonal branch, vein-obtuse marginal branch, and resectioning and grafting of  throacic aortic aneurysm  . ELBOW SURGERY  1979   fracture left  . THORACIC AORTIC ANEURYSM REPAIR    . TONSILLECTOMY  1945  . TUBAL LIGATION  1971    Social History:  Social History   Socioeconomic History  . Marital status: Married    Spouse name: Not on file  . Number of children: Not on file  . Years of education: Not on file  . Highest education level: Not on file  Occupational History  . Not on file  Social Needs  . Financial resource strain: Not on file  . Food insecurity:    Worry: Not on file    Inability: Not on file  . Transportation needs:    Medical: Not on file    Non-medical: Not on file  Tobacco Use  . Smoking status: Never Smoker  . Smokeless tobacco: Never Used  Substance and Sexual Activity  . Alcohol use: No  . Drug use: No  . Sexual activity: Not on file  Lifestyle  . Physical activity:    Days per week: Not on file    Minutes per session: Not on file  . Stress: Not  on file  Relationships  . Social connections:    Talks on phone: Not on file    Gets together: Not on file    Attends religious service: Not on file    Active member of club or organization: Not on file    Attends meetings of clubs or organizations: Not on file    Relationship status: Not on file  . Intimate partner violence:    Fear of current or ex partner: Not on file    Emotionally abused: Not on file    Physically abused: Not on file    Forced sexual activity: Not on file  Other Topics Concern  . Not on file  Social History Narrative   Patient's daughter is a Designer, jewellery who does hospitalist work at [employer].  Her son is also in health care.    Family History:  Family History  Problem Relation Age of Onset  . Hypertension Mother   . Osteoporosis Mother   . Heart disease Father   . Stroke Father   . Alzheimer's disease Father   . Heart attack Father     Medications:   Current Outpatient Medications on File Prior to Visit  Medication Sig  Dispense Refill  . albuterol (PROVENTIL) (2.5 MG/3ML) 0.083% nebulizer solution INHALE 1 NEBULE VIA NEBULIZER Q 2 HOURS PRN FOR WHEEZING OR SHORTNESS OF BREATH  5  . amLODipine (NORVASC) 2.5 MG tablet TAKE 1 TABLET(2.5 MG) BY MOUTH DAILY 90 tablet 3  . aspirin EC 81 MG tablet Take 1 tablet (81 mg total) by mouth daily. 90 tablet 3  . atorvastatin (LIPITOR) 40 MG tablet Take 1 tablet (40 mg total) by mouth daily at 6 PM. 30 tablet 6  . budesonide (PULMICORT) 0.25 MG/2ML nebulizer solution Take 0.25 mg by nebulization as directed.     . donepezil (ARICEPT) 10 MG tablet TAKE 1 TABLET(10 MG) BY MOUTH AT BEDTIME 90 tablet 3  . empagliflozin (JARDIANCE) 25 MG TABS tablet Take 12.5 mg by mouth daily. 30 tablet 3  . escitalopram (LEXAPRO) 10 MG tablet TAKE 1 TABLET(10 MG) BY MOUTH DAILY 90 tablet 3  . furosemide (LASIX) 40 MG tablet TAKE 1 TABLET BY MOUTH DAILY AS NEEDED FOR FLUID RETENTION 90 tablet 1  . loratadine (CLARITIN) 10 MG tablet Take 10 mg by mouth daily as needed for allergies.    Marland Kitchen losartan (COZAAR) 50 MG tablet Take 1 tablet (50 mg total) by mouth daily. 90 tablet 3  . memantine (NAMENDA) 10 MG tablet Take 1 tablet (10 mg total) by mouth 2 (two) times daily. 60 tablet 5  . metoCLOPramide (REGLAN) 5 MG tablet Take 1 tablet (5 mg total) by mouth 4 (four) times daily -  before meals and at bedtime. 120 tablet 5  . metoprolol succinate (TOPROL-XL) 50 MG 24 hr tablet Take 1 tablet (50 mg total) by mouth daily. 90 tablet 3  . nitroGLYCERIN (NITROSTAT) 0.4 MG SL tablet Place 1 tablet (0.4 mg total) under the tongue every 5 (five) minutes as needed for chest pain (MAX 3 TABLETS). 25 tablet 4  . rivaroxaban (XARELTO) 20 MG TABS tablet Take 1 tablet (20 mg total) by mouth daily with supper. 90 tablet 1  . sitaGLIPtin (JANUVIA) 100 MG tablet Take 1 tablet (100 mg total) by mouth daily. 30 tablet 3  . SYNTHROID 112 MCG tablet TAKE 1/2 TABLET BY MOUTH DAILY BEFORE BREAKFAST 90 tablet 1   No current  facility-administered medications on file prior to visit.  Allergies:   Allergies  Allergen Reactions  . Contrast Media [Iodinated Diagnostic Agents] Anaphylaxis and Swelling  . Iodine Anaphylaxis and Swelling  . Shellfish Allergy Anaphylaxis    unknown  . Gabapentin Hives     Physical Exam  Vitals:   03/14/18 1337  BP: (!) 148/86  Pulse: 60  Weight: 200 lb (90.7 kg)  Height: 5\' 9"  (1.753 m)   Body mass index is 29.53 kg/m. No exam data present  General: well developed, pleasant elderly Caucasian female, well nourished, seated, in no evident distress Head: head normocephalic and atraumatic.   Neck: supple with no carotid or supraclavicular bruits Cardiovascular: irregular rate and rhythm, no murmurs Musculoskeletal: no deformity Skin:  no rash/petichiae Vascular:  Normal pulses all extremities   Neurologic Exam Mental Status: Awake and fully alert. Remote memory intact. Attention span, concentration and fund of knowledge appropriate. Mood and affect appropriate.  Cranial Nerves: Fundoscopic exam reveals sharp disc margins. Pupils equal, briskly reactive to light. Extraocular movements full without nystagmus. Visual fields full to confrontation. Hearing intact. Facial sensation intact. Face, tongue, palate moves normally and symmetrically.  Motor: Normal bulk and tone. Normal strength in all tested extremity muscles. Sensory.:  Decreased sensation in bilateral lower extremities due to peripheral neuropathy Coordination: Rapid alternating movements normal in all extremities. Finger-to-nose and heel-to-shin performed accurately bilaterally. Gait and Station: Arises from chair without difficulty. Stance is normal. Gait demonstrates normal stride length and balance . Able to heel, toe and tandem walk without difficulty.  Reflexes: 1+ and symmetric. Toes downgoing.     Diagnostic Data (Labs, Imaging, Testing)  CT head Result date: 09/29/2017 IMPRESSION: 1. Stable  atrophy and white matter disease. 2. Stable remote lacunar infarcts of the cerebellum. 3. Question hyperdense left MCA. This does not correspond with the patient's left-sided defect, but could be related to speech abnormalities. 4. ASPECTS is 10/10  MR MRI head IMPRESSION: MRI HEAD IMPRESSION: 1. No acute intracranial infarct or other process identified. 2. Mild to moderate chronic small vessel ischemic disease for age, with a few small remote bilateral cerebellar infarcts. 3. Chronic left maxillary sinusitis. MRA HEAD IMPRESSION: 1. Negative intracranial MRA for large vessel occlusion. 2. Predominant fetal type origin of the PCAs bilaterally with markedly diminutive and irregular basilar artery with multifocal moderate to severe stenoses.  Bilateral carotid ultrasound Result date: 09/30/2017 Final Interpretation: Right Carotid: Velocities in the right ICA are consistent with a 1-39% stenosis. Left Carotid: Velocities in the left ICA are consistent with a 1-39% stenosis. Vertebrals: Both vertebral arteries were patent with antegrade flow.  2D echo Result date: 09/22/2017 Study Conclusions  - Left ventricle: The cavity size was normal. Systolic function was   normal. The estimated ejection fraction was in the range of 55%   to 60%. Wall motion was normal; there were no regional wall   motion abnormalities. - Ventricular septum: Septal motion showed paradox. These changes   are consistent with a post-thoracotomy state. - Mitral valve: Calcified annulus. There was mild regurgitation   directed centrally. - Left atrium: The atrium was mildly dilated. - Right atrium: The atrium was mildly dilated. - Atrial septum: No defect or patent foramen ovale was identified. - Pulmonary arteries: Systolic pressure was mildly increased. PA   peak pressure: 43 mm Hg (S).   ASSESSMENT: Lindsay Nguyen is a 82 y.o. year old female here with TIA on 09/29/2017 secondary to atrial fibrillation  and missed doses on Eliquis. Vascular risk factors include HTN, HLD, DM and  [name].     PLAN: -Continue aspirin 81 mg daily and Xarelto (rivaroxaban) daily  and Lipitor for secondary stroke prevention -continue namenda and aricept for dementia/memory loss -daughter will look into if insurance will cover Namzeric and will notify PCP if they are able to obtain this for combination dosage -f/u with cardiologist regarding atrial fibrillation management and Xarelto management -F/u with PCP regarding your HLD, HTN, and DM management -continue to monitor BP at home -Advised to do mind exercises such as suduku, word search, crossword puzzles and card games -Maintain strict control of hypertension with blood pressure goal below 130/90, diabetes with hemoglobin A1c goal below 6.5% and cholesterol with LDL cholesterol (bad cholesterol) goal below 70 mg/dL. I also advised the patient to eat a healthy diet with plenty of whole grains, cereals, fruits and vegetables, exercise regularly and maintain ideal body weight.  Follow up in 6 months or call earlier if needed  Greater than 50% time during this 40 minute consultation visit was spent on counseling and coordination of care about HLD, HTN, DM and [name], discussion about risk benefit of anticoagulation and answering questions.   Venancio Poisson, AGNP-BC  [facility] [address] [place], [place] [address]  Phone [phone] Fax [phone]

## 2018-03-14 ENCOUNTER — Ambulatory Visit (INDEPENDENT_AMBULATORY_CARE_PROVIDER_SITE_OTHER): Payer: Medicare Other | Admitting: Adult Health

## 2018-03-14 ENCOUNTER — Encounter: Payer: Self-pay | Admitting: Adult Health

## 2018-03-14 VITALS — BP 148/86 | HR 60 | Ht 69.0 in | Wt 200.0 lb

## 2018-03-14 DIAGNOSIS — I480 Paroxysmal atrial fibrillation: Secondary | ICD-10-CM

## 2018-03-14 DIAGNOSIS — I10 Essential (primary) hypertension: Secondary | ICD-10-CM | POA: Diagnosis not present

## 2018-03-14 DIAGNOSIS — Z7901 Long term (current) use of anticoagulants: Secondary | ICD-10-CM | POA: Diagnosis not present

## 2018-03-14 DIAGNOSIS — E119 Type 2 diabetes mellitus without complications: Secondary | ICD-10-CM | POA: Diagnosis not present

## 2018-03-14 DIAGNOSIS — Z794 Long term (current) use of insulin: Secondary | ICD-10-CM

## 2018-03-14 DIAGNOSIS — F0390 Unspecified dementia without behavioral disturbance: Secondary | ICD-10-CM | POA: Diagnosis not present

## 2018-03-14 DIAGNOSIS — G459 Transient cerebral ischemic attack, unspecified: Secondary | ICD-10-CM | POA: Diagnosis not present

## 2018-03-14 NOTE — Patient Instructions (Signed)
Continue aspirin 81 mg daily and Xarelto (rivaroxaban) daily  and lipitor  for secondary stroke prevention  Continue to follow up with PCP regarding cholesterol, blood pressure and diabetes management   Follow up with cardiology for atrial fibrillation and Xarelto management   Continue namenda and aricept for memory loss - look into if insurance will cover Namzeric in order to combine both namenda and aricept medications  Try to increase active and ensure you are drinking enough fluids  Continue to monitor blood pressure at home  Maintain strict control of hypertension with blood pressure goal below 130/90, diabetes with hemoglobin A1c goal below 6.5% and cholesterol with LDL cholesterol (bad cholesterol) goal below 70 mg/dL. I also advised the patient to eat a healthy diet with plenty of whole grains, cereals, fruits and vegetables, exercise regularly and maintain ideal body weight.  Followup in the future with me in 6 months or call earlier if needed       Thank you for coming to see [place] at [facility]. I hope we have been able to provide you high quality care today.  You may receive a patient satisfaction survey over the next few weeks. We would appreciate your feedback and comments so that we may continue to improve ourselves and the health of our patients.

## 2018-03-17 NOTE — Progress Notes (Signed)
I agree with the above plan 

## 2018-03-25 ENCOUNTER — Other Ambulatory Visit: Payer: Self-pay | Admitting: Family Medicine

## 2018-04-30 ENCOUNTER — Other Ambulatory Visit: Payer: Self-pay | Admitting: Family Medicine

## 2018-05-11 ENCOUNTER — Other Ambulatory Visit: Payer: Self-pay | Admitting: Family Medicine

## 2018-05-11 DIAGNOSIS — Z23 Encounter for immunization: Secondary | ICD-10-CM | POA: Diagnosis not present

## 2018-05-27 ENCOUNTER — Other Ambulatory Visit: Payer: Self-pay | Admitting: Family Medicine

## 2018-06-05 ENCOUNTER — Other Ambulatory Visit: Payer: Self-pay | Admitting: Family Medicine

## 2018-07-09 ENCOUNTER — Other Ambulatory Visit: Payer: Self-pay | Admitting: Family Medicine

## 2018-07-11 ENCOUNTER — Other Ambulatory Visit: Payer: Self-pay | Admitting: Family Medicine

## 2018-07-19 ENCOUNTER — Other Ambulatory Visit: Payer: Self-pay | Admitting: Family Medicine

## 2018-08-02 ENCOUNTER — Encounter: Payer: Self-pay | Admitting: Family Medicine

## 2018-08-05 ENCOUNTER — Ambulatory Visit (INDEPENDENT_AMBULATORY_CARE_PROVIDER_SITE_OTHER): Payer: Medicare Other | Admitting: Family Medicine

## 2018-08-05 ENCOUNTER — Encounter: Payer: Self-pay | Admitting: Family Medicine

## 2018-08-05 VITALS — BP 120/60 | HR 76 | Temp 98.1°F | Resp 16 | Ht 69.0 in | Wt 194.0 lb

## 2018-08-05 DIAGNOSIS — F0280 Dementia in other diseases classified elsewhere without behavioral disturbance: Secondary | ICD-10-CM | POA: Diagnosis not present

## 2018-08-05 DIAGNOSIS — G301 Alzheimer's disease with late onset: Secondary | ICD-10-CM

## 2018-08-05 NOTE — Addendum Note (Signed)
Addended by: Sheral Flow on: 08/05/2018 02:19 PM   Modules accepted: Orders

## 2018-08-05 NOTE — Progress Notes (Signed)
Subjective:    Patient ID: Lindsay Nguyen, female    DOB: [birth year], 83 y.o.   MRN: [id number]  HPI  11/26/17 Patient is here today for follow-up.  In January, her hemoglobin A1c was elevated at 7.5.  However at that time, the patient's family elected to monitor her sugars more closely and ensure compliance with medication rather than add additional medication.  Therefore she is still taking a combination of Amaryl and onglyza.  She denies any hypoglycemia.  Her fasting blood sugars in the morning are on average between 110 and 115.  The highest her sugars have been 2 hours postprandially is 180.  We had a long discussion today about the risk of taking Amaryl in an elderly patient.  I would prefer the patient to be on a medication such as Jardiance given his reduction in cardiovascular mortality however cost is an issue for this patient which I certainly understand.  She denies any chest pain shortness of breath or dyspnea on exertion.  Neurology recently recommended Namenda 10 mg twice daily.  She is requesting that we refill this for her which I will gladly do.  At that time, my plan was: Patient's blood pressure is outstanding.  I will check a CBC, CMP, lipid panel, and hemoglobin A1c.  Goal hemoglobin A1c is less than 7.  Given her advanced age, I would be willing to accept an A1c between 7 and 7.5.  I have recommended discontinuing Amaryl and replacing it with Jardiance to reduce the risk of hypoglycemia.  Patient's daughter would like to check on the cost of the medication first to see if it is affordable.  If it is not affordable we will continue with the current medications at the present dosages.  Goal LDL cholesterol is less than 70 given her history of TIA.  I did refill the patient's Namenda 10 mg twice daily  08/05/18 Has not been seen since April.  Recently received this communication from daughter:  I just wanted to bring a few issues to the forefront that is at the center for our visit  Friday. Don't want to embarrass mom. Mom has had multiple issues of both urinary and fecal incontinence recently, not every day but several. She is having back pain and at times abdominal cramping with diarrhea. Getting tough to get her to eat and mainly wants sweet snacks.Spending 16 to 18 hours in bed and co fatigue.Very weak and has had some falls.Fights [place] some verbally with getting up. I have been giving her showers for months now and she at times fights this. I am going to have to have surgery on my dominant arm soon and will be less able to help. We understand Mom is becoming end stages rapidly and are not wanting heroics, just comfort and in home help.Not interested in SNF but would like to investigate Hospice. My brother is struggling a little more with her not getting OOB. I want to try to keep her moving but not at expense of fighting making whatever time she has poor!  I initially told the daughter and her brother aside to discuss the situation without the mom being present.  Daughter is requesting a hospice referral.  When I specifically asked what they were hoping to achieve with hospice referral, she was hoping for help getting mom out of bed.  She was hoping to help with assistance with ADLs such as bathing.  She states that mother will stay in bed up to 20 hours a  day if allowed.  She is able to walk.  However she is having to use a walker.  She is getting progressively weaker and more deconditioned.  This is causing increasing incontinence as the patient is unable to make it to the restroom in time.  However the patient is still conversant.  She is able to answer questions appropriately.  She is still feeding herself.  She has lost 6 pounds since August.  Her medications are supervised by her family.  At times she elects not to take her medications.  She saw her cardiologist in July.  Her ejection fraction had actually improved to 65%.  She does have a history of TIA and diabetes however  I do not believe that these are at the level to be considered terminal illnesses requiring hospice.  Her dementia is certainly moderate but I do not believe she is at end-stage dementia.  She requires assistance with ADLs.  She requires physical therapy.  I believe these would be better addressed with home health and physical therapy.  However part of the issue is financial and being able to cover this.  Past Medical History:  Diagnosis Date  . Anemia   . Atrial fibrillation ([place])    persistent; on Eliquis  . CAD (coronary artery disease)    2D ECHO, 11/04/2010 - EF >55%, mild-moderate mitral regurgitation, moderate tricuspid regurgitation  . Dementia ([place])   . Diabetes mellitus   . GERD (gastroesophageal reflux disease)   . Hyperlipidemia   . Hypertension   . Non-STEMI (non-ST elevated myocardial infarction) ([facility])    NUCLEAR STRESS TEST, 11/04/2010 - normal, no ischemia  . Osteopenia   . Pain in limb    LEA VENOUS DUPLEX, 06/27/2010 - no evidence of DVT  . Stroke ([place])   . Thoracic aortic aneurysm ([facility])    status post resection and grafting  . Thyroid disease    hypothyroid   Past Surgical History:  Procedure Laterality Date  . CARDIAC CATHETERIZATION  06/18/2010   Recommended CABG  . CARDIOVERSION N/A 09/30/2015   Procedure: CARDIOVERSION;  Surgeon: [name], MD;  Location: [facility] ENDOSCOPY;  Service: Cardiovascular;  Laterality: N/A;  . CARDIOVERSION N/A 06/05/2016   Procedure: CARDIOVERSION;  Surgeon: [name], MD;  Location: [facility] ENDOSCOPY;  Service: Cardiovascular;  Laterality: N/A;  . CHOLECYSTECTOMY    . CORONARY ARTERY BYPASS GRAFT  06/18/2010   LIMA-LAD, vein-diagonal branch, vein-obtuse marginal branch, and resectioning and grafting of throacic aortic aneurysm  . ELBOW SURGERY  1979   fracture left  . THORACIC AORTIC ANEURYSM REPAIR    . TONSILLECTOMY  1945  . TUBAL LIGATION  1971   Current Outpatient Medications on File Prior to Visit  Medication Sig Dispense  Refill  . albuterol (PROVENTIL) (2.5 MG/3ML) 0.083% nebulizer solution INHALE 1 NEBULE VIA NEBULIZER Q 2 HOURS PRN FOR WHEEZING OR SHORTNESS OF BREATH  5  . amLODipine (NORVASC) 2.5 MG tablet TAKE 1 TABLET(2.5 MG) BY MOUTH DAILY 90 tablet 3  . aspirin EC 81 MG tablet Take 1 tablet (81 mg total) by mouth daily. 90 tablet 3  . atorvastatin (LIPITOR) 40 MG tablet Take 1 tablet (40 mg total) by mouth daily at 6 PM. 30 tablet 6  . budesonide (PULMICORT) 0.25 MG/2ML nebulizer solution Take 0.25 mg by nebulization as directed.     . donepezil (ARICEPT) 10 MG tablet TAKE 1 TABLET(10 MG) BY MOUTH AT BEDTIME 90 tablet 3  . empagliflozin (JARDIANCE) 25 MG TABS tablet Take 12.5  mg by mouth daily. 30 tablet 3  . escitalopram (LEXAPRO) 10 MG tablet TAKE 1 TABLET(10 MG) BY MOUTH DAILY 90 tablet 3  . furosemide (LASIX) 40 MG tablet TAKE 1 TABLET BY MOUTH DAILY AS NEEDED FOR FLUID RETENTION 90 tablet 1  . JANUVIA 100 MG tablet TAKE 1 TABLET BY MOUTH DAILY 90 tablet 0  . loratadine (CLARITIN) 10 MG tablet Take 10 mg by mouth daily as needed for allergies.    Marland Kitchen losartan (COZAAR) 50 MG tablet TAKE 1 TABLET BY MOUTH DAILY(DISCONTINUE VALSARTAN) 90 tablet 1  . memantine (NAMENDA) 10 MG tablet TAKE 1 TABLET(10 MG) BY MOUTH TWICE DAILY 60 tablet 0  . metoCLOPramide (REGLAN) 5 MG tablet TAKE 1 TABLET(5 MG) BY MOUTH FOUR TIMES DAILY BEFORE MEALS AND AT BEDTIME 120 tablet 0  . metoprolol succinate (TOPROL-XL) 50 MG 24 hr tablet Take 1 tablet (50 mg total) by mouth daily. 90 tablet 3  . nitroGLYCERIN (NITROSTAT) 0.4 MG SL tablet Place 1 tablet (0.4 mg total) under the tongue every 5 (five) minutes as needed for chest pain (MAX 3 TABLETS). 25 tablet 4  . SYNTHROID 112 MCG tablet TAKE 1/2 TABLET BY MOUTH DAILY BEFORE BREAKFAST 90 tablet 0  . XARELTO 20 MG TABS tablet TAKE 1 TABLET(20 MG) BY MOUTH DAILY WITH SUPPER 90 tablet 3   No current facility-administered medications on file prior to visit.    Allergies  Allergen  Reactions  . Contrast Media [Iodinated Diagnostic Agents] Anaphylaxis and Swelling  . Iodine Anaphylaxis and Swelling  . Shellfish Allergy Anaphylaxis    unknown  . Gabapentin Hives   Social History   Socioeconomic History  . Marital status: Married    Spouse name: Not on file  . Number of children: Not on file  . Years of education: Not on file  . Highest education level: Not on file  Occupational History  . Not on file  Social Needs  . Financial resource strain: Not on file  . Food insecurity:    Worry: Not on file    Inability: Not on file  . Transportation needs:    Medical: Not on file    Non-medical: Not on file  Tobacco Use  . Smoking status: Never Smoker  . Smokeless tobacco: Never Used  Substance and Sexual Activity  . Alcohol use: No  . Drug use: No  . Sexual activity: Not on file  Lifestyle  . Physical activity:    Days per week: Not on file    Minutes per session: Not on file  . Stress: Not on file  Relationships  . Social connections:    Talks on phone: Not on file    Gets together: Not on file    Attends religious service: Not on file    Active member of club or organization: Not on file    Attends meetings of clubs or organizations: Not on file    Relationship status: Not on file  . Intimate partner violence:    Fear of current or ex partner: Not on file    Emotionally abused: Not on file    Physically abused: Not on file    Forced sexual activity: Not on file  Other Topics Concern  . Not on file  Social History Narrative   Patient's daughter is a Designer, jewellery who does hospitalist work at [employer].  Her son is also in health care.      Review of Systems  All other systems reviewed and are  negative.      Objective:   Physical Exam  Constitutional: She is oriented to person, place, and time. She appears well-developed and well-nourished. No distress.  Neck: Neck supple. No JVD present.  Cardiovascular: Normal rate. An  irregularly irregular rhythm present.  Murmur heard. Pulmonary/Chest: Effort normal and breath sounds normal. No respiratory distress. She has no wheezes. She has no rales.  Abdominal: Soft. Bowel sounds are normal. She exhibits no distension. There is no abdominal tenderness. There is no rebound.  Musculoskeletal:        General: No edema.  Neurological: She is alert and oriented to person, place, and time. No cranial nerve deficit. She exhibits normal muscle tone. Coordination normal.  Skin: She is not diaphoretic.  Vitals reviewed.         Assessment & Plan:  Alzheimer's dementia  My sympathy goes to the family.  I certainly believe that her dementia is progressing.  However I do not believe she meets criteria for hospice referral.  I do not believe she has a requisite terminal illness.  I believe she would be better cared for by home health or skilled nursing facility.  I realize that this creates a financial burden for the family however I also have to respect the appropriateness of the hospice referral.  Therefore I will consult home health physical therapy as well as for assistance with ADLs.  We could even consider hospice if the situation deteriorates further but at the present time I believe she would be declined.

## 2018-08-15 ENCOUNTER — Emergency Department (HOSPITAL_COMMUNITY): Payer: Medicare Other

## 2018-08-15 ENCOUNTER — Emergency Department (HOSPITAL_COMMUNITY)
Admission: EM | Admit: 2018-08-15 | Discharge: 2018-08-15 | Disposition: A | Discharge status: Home / Self Care | Payer: Medicare Other | Attending: Emergency Medicine | Admitting: Emergency Medicine

## 2018-08-15 DIAGNOSIS — I252 Old myocardial infarction: Secondary | ICD-10-CM | POA: Diagnosis not present

## 2018-08-15 DIAGNOSIS — E119 Type 2 diabetes mellitus without complications: Secondary | ICD-10-CM | POA: Diagnosis not present

## 2018-08-15 DIAGNOSIS — Z8673 Personal history of transient ischemic attack (TIA), and cerebral infarction without residual deficits: Secondary | ICD-10-CM | POA: Diagnosis not present

## 2018-08-15 DIAGNOSIS — Z7982 Long term (current) use of aspirin: Secondary | ICD-10-CM | POA: Insufficient documentation

## 2018-08-15 DIAGNOSIS — I110 Hypertensive heart disease with heart failure: Secondary | ICD-10-CM | POA: Insufficient documentation

## 2018-08-15 DIAGNOSIS — F0390 Unspecified dementia without behavioral disturbance: Secondary | ICD-10-CM | POA: Insufficient documentation

## 2018-08-15 DIAGNOSIS — Z7984 Long term (current) use of oral hypoglycemic drugs: Secondary | ICD-10-CM | POA: Diagnosis not present

## 2018-08-15 DIAGNOSIS — I5032 Chronic diastolic (congestive) heart failure: Secondary | ICD-10-CM | POA: Diagnosis not present

## 2018-08-15 DIAGNOSIS — R531 Weakness: Secondary | ICD-10-CM | POA: Diagnosis not present

## 2018-08-15 DIAGNOSIS — R1084 Generalized abdominal pain: Secondary | ICD-10-CM | POA: Diagnosis not present

## 2018-08-15 DIAGNOSIS — E039 Hypothyroidism, unspecified: Secondary | ICD-10-CM | POA: Insufficient documentation

## 2018-08-15 DIAGNOSIS — Z7901 Long term (current) use of anticoagulants: Secondary | ICD-10-CM | POA: Insufficient documentation

## 2018-08-15 DIAGNOSIS — I4891 Unspecified atrial fibrillation: Secondary | ICD-10-CM | POA: Diagnosis not present

## 2018-08-15 DIAGNOSIS — Z79899 Other long term (current) drug therapy: Secondary | ICD-10-CM | POA: Diagnosis not present

## 2018-08-15 DIAGNOSIS — I2510 Atherosclerotic heart disease of native coronary artery without angina pectoris: Secondary | ICD-10-CM | POA: Diagnosis not present

## 2018-08-15 DIAGNOSIS — R5383 Other fatigue: Secondary | ICD-10-CM | POA: Diagnosis present

## 2018-08-15 DIAGNOSIS — R42 Dizziness and giddiness: Secondary | ICD-10-CM | POA: Diagnosis not present

## 2018-08-15 DIAGNOSIS — I4819 Other persistent atrial fibrillation: Secondary | ICD-10-CM | POA: Diagnosis not present

## 2018-08-15 DIAGNOSIS — I499 Cardiac arrhythmia, unspecified: Secondary | ICD-10-CM | POA: Diagnosis not present

## 2018-08-15 DIAGNOSIS — E785 Hyperlipidemia, unspecified: Secondary | ICD-10-CM | POA: Insufficient documentation

## 2018-08-15 DIAGNOSIS — R52 Pain, unspecified: Secondary | ICD-10-CM | POA: Diagnosis not present

## 2018-08-15 DIAGNOSIS — R404 Transient alteration of awareness: Secondary | ICD-10-CM | POA: Diagnosis not present

## 2018-08-15 LAB — URINALYSIS, COMPLETE (UACMP) WITH MICROSCOPIC
Bilirubin Urine: NEGATIVE
GLUCOSE, UA: 50 mg/dL — AB
Hgb urine dipstick: NEGATIVE
Ketones, ur: 5 mg/dL — AB
Leukocytes, UA: NEGATIVE
Nitrite: NEGATIVE
Protein, ur: >=300 mg/dL — AB
Specific Gravity, Urine: 1.015 (ref 1.005–1.030)
pH: 8 (ref 5.0–8.0)

## 2018-08-15 LAB — CBC WITH DIFFERENTIAL/PLATELET
Abs Immature Granulocytes: 0.03 10*3/uL (ref 0.00–0.07)
Basophils Absolute: 0 10*3/uL (ref 0.0–0.1)
Basophils Relative: 0 %
EOS PCT: 1 %
Eosinophils Absolute: 0.1 10*3/uL (ref 0.0–0.5)
HCT: 39.6 % (ref 36.0–46.0)
Hemoglobin: 13.7 g/dL (ref 12.0–15.0)
Immature Granulocytes: 0 %
Lymphocytes Relative: 29 %
Lymphs Abs: 2.6 10*3/uL (ref 0.7–4.0)
MCH: 32.5 pg (ref 26.0–34.0)
MCHC: 34.6 g/dL (ref 30.0–36.0)
MCV: 94.1 fL (ref 80.0–100.0)
MONO ABS: 1.2 10*3/uL — AB (ref 0.1–1.0)
Monocytes Relative: 13 %
Neutro Abs: 5.2 10*3/uL (ref 1.7–7.7)
Neutrophils Relative %: 57 %
Platelets: 210 10*3/uL (ref 150–400)
RBC: 4.21 MIL/uL (ref 3.87–5.11)
RDW: 12.1 % (ref 11.5–15.5)
WBC: 9.1 10*3/uL (ref 4.0–10.5)
nRBC: 0 % (ref 0.0–0.2)

## 2018-08-15 LAB — COMPREHENSIVE METABOLIC PANEL
ALBUMIN: 3.1 g/dL — AB (ref 3.5–5.0)
ALT: 13 U/L (ref 0–44)
AST: 17 U/L (ref 15–41)
Alkaline Phosphatase: 62 U/L (ref 38–126)
Anion gap: 11 (ref 5–15)
BUN: 13 mg/dL (ref 8–23)
CO2: 21 mmol/L — ABNORMAL LOW (ref 22–32)
Calcium: 8.5 mg/dL — ABNORMAL LOW (ref 8.9–10.3)
Chloride: 100 mmol/L (ref 98–111)
Creatinine, Ser: 0.8 mg/dL (ref 0.44–1.00)
GFR calc Af Amer: >60 mL/min (ref >60)
GFR calc non Af Amer: >60 mL/min (ref >60)
GLUCOSE: 193 mg/dL — AB (ref 70–99)
Potassium: 2.9 mmol/L — ABNORMAL LOW (ref 3.5–5.1)
Sodium: 132 mmol/L — ABNORMAL LOW (ref 135–145)
Total Bilirubin: 0.9 mg/dL (ref 0.3–1.2)
Total Protein: 6.9 g/dL (ref 6.5–8.1)

## 2018-08-15 LAB — I-STAT TROPONIN, ED: Troponin i, poc: 0.01 ng/mL (ref 0.00–0.08)

## 2018-08-15 LAB — CBG MONITORING, ED: Glucose-Capillary: 276 mg/dL — ABNORMAL HIGH (ref 70–99)

## 2018-08-15 MED ORDER — SODIUM CHLORIDE 0.9 % IV BOLUS
500.0000 mL | Freq: Once | INTRAVENOUS | Status: AC
  Administered 2018-08-15: 500 mL via INTRAVENOUS

## 2018-08-15 MED ORDER — POTASSIUM CHLORIDE 20 MEQ/15ML (10%) PO SOLN
40.0000 meq | Freq: Once | ORAL | Status: AC
  Administered 2018-08-15: 40 meq via ORAL
  Filled 2018-08-15: qty 30

## 2018-08-15 NOTE — Discharge Instructions (Addendum)
Please follow-up with your primary care doctor, cardiology about medications.

## 2018-08-15 NOTE — Progress Notes (Signed)
Consult request has been received. CSW attempting to follow up at present time.  CSW spoke to EDP who states pt's family desires [facility].  EPD who will place a consult for CM with an order for face-to-face and an order for RN/Aide/PT/OT and Social Work.  RN CM updated.  Alphonse Guild. Chigozie Basaldua, LCSW, LCAS, CSI Clinical Social Worker Ph: [phone]

## 2018-08-15 NOTE — ED Notes (Signed)
Pt declined rectal temperature.

## 2018-08-15 NOTE — ED Triage Notes (Addendum)
[facility] reports pt from home, with chronic generalized weakness and deconditioning since TIA 09/2017. Pt was to have PT today but was too weak and diaphoretic. Positive ortho statics.  Laying 1620/82, sitting 140/80, standing 150/80 cbg 187 187/81 hr 57, rr 22, 97% RA

## 2018-08-15 NOTE — ED Notes (Signed)
Pt alert and oriented in NAD. Pt and son verbalized understanding of discharge instructions and had no further questions.

## 2018-08-15 NOTE — Care Management (Signed)
ED CM received consult  Concerning home health. CM met with patient and daughter Damaris Hippo [phone] at bedside.  Patient c/o generalized weakness, she reports that she was scheduled today to start HHPT with [facility] but patient came to ED for evaluation. Discussed HH recommendations for adding additional services, patient and daughter are agreeable with those recommendations. ED CM received orders and sent to [facility] via Epic also contacted [employer] for [facility]. Who states start of care will be tomorrow 1/14. No further ED CM needs identified.

## 2018-08-15 NOTE — ED Provider Notes (Signed)
[facility] EMERGENCY DEPARTMENT Provider Note   CSN: [id number] Arrival date & time: 08/15/18  1728     History   Chief Complaint Chief Complaint  Patient presents with  . Fatigue    HPI Lindsay Nguyen is a 83 y.o. female.  The history is provided by the patient.  Weakness  Severity:  Mild Onset quality:  Gradual Timing:  Constant Progression:  Worsening Chronicity:  New Context comment:  Patient here for general weakness, having lightheadedness when standing up. No symptoms at rest. No chest pain, no SOB. Relieved by:  Nothing Worsened by:  Nothing Associated symptoms: no abdominal pain, no anorexia, no arthralgias, no chest pain, no cough, no diarrhea, no dizziness, no drooling, no dysuria, no falls, no fever, no foul-smelling urine, no loss of consciousness, no myalgias, no nausea, no seizures, no shortness of breath, no stroke symptoms, no urgency, no vision change and no vomiting   Risk factors: anemia   Risk factors comment:  Afib, dementia, stroke   Past Medical History:  Diagnosis Date  . Anemia   . Atrial fibrillation ([place])    persistent; on Eliquis  . CAD (coronary artery disease)    2D ECHO, 11/04/2010 - EF >55%, mild-moderate mitral regurgitation, moderate tricuspid regurgitation  . Dementia ([place])   . Diabetes mellitus   . GERD (gastroesophageal reflux disease)   . Hyperlipidemia   . Hypertension   . Non-STEMI (non-ST elevated myocardial infarction) (HCC)    NUCLEAR STRESS TEST, 11/04/2010 - normal, no ischemia  . Osteopenia   . Pain in limb    LEA VENOUS DUPLEX, 06/27/2010 - no evidence of DVT  . Stroke ([place])   . Thoracic aortic aneurysm (HCC)    status post resection and grafting  . Thyroid disease    hypothyroid    Patient Active Problem List   Diagnosis Date Noted  . Medication noncompliance due to cognitive impairment   . TIA (transient ischemic attack) 09/29/2017  . Dementia ([place]) 09/29/2017  . History of depression  11/17/2016  . Bradycardia, sinus 10/22/2015  . Acute diastolic (congestive) heart failure ([place]) 10/22/2015  . Chronic anticoagulation 10/22/2015  . 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 PAF (paroxysmal atrial fibrillation) ([place]) 08/07/2015  . Metabolic disorder, iron 22/63/3354  . PVD (peripheral vascular disease)-  01/20/2013  . Bronchitis 01/20/2013  . Bronchopneumonia 04/04/2011  . Myalgia 10/26/2010  . PLEURAL EFFUSION, RIGHT 08/20/2010  . Hx of CABG 05/01/2010  . Hypothyroidism 08/20/2009  . Controlled diabetes mellitus type II without complication ([place]) 56/25/6389  . HLD (hyperlipidemia) 08/20/2009  . Essential hypertension 08/20/2009  . GERD 08/20/2009  . OSTEOPENIA 08/20/2009    Past Surgical History:  Procedure Laterality Date  . CARDIAC CATHETERIZATION  06/18/2010   Recommended CABG  . CARDIOVERSION N/A 09/30/2015   Procedure: CARDIOVERSION;  Surgeon: [name], MD;  Location: [facility] ENDOSCOPY;  Service: Cardiovascular;  Laterality: N/A;  . CARDIOVERSION N/A 06/05/2016   Procedure: CARDIOVERSION;  Surgeon: [name], MD;  Location: [facility] ENDOSCOPY;  Service: Cardiovascular;  Laterality: N/A;  . CHOLECYSTECTOMY    . CORONARY ARTERY BYPASS GRAFT  06/18/2010   LIMA-LAD, vein-diagonal branch, vein-obtuse marginal branch, and resectioning and grafting of throacic aortic aneurysm  . ELBOW SURGERY  1979   fracture left  . THORACIC AORTIC ANEURYSM REPAIR    . TONSILLECTOMY  1945  . TUBAL LIGATION  1971     OB History   No obstetric history on file.  Home Medications    Prior to Admission medications   Medication Sig Start Date End Date Taking? Authorizing Provider  albuterol (PROVENTIL) (2.5 MG/3ML) 0.083% nebulizer solution INHALE 1 NEBULE VIA NEBULIZER Q 2 HOURS PRN FOR WHEEZING OR SHORTNESS OF BREATH 05/24/16   [provider]  amLODipine (NORVASC) 2.5 MG tablet TAKE 1 TABLET(2.5 MG) BY MOUTH DAILY 09/03/17   [name], MD  aspirin EC 81 MG  tablet Take 1 tablet (81 mg total) by mouth daily. 08/07/15   [name], MD  atorvastatin (LIPITOR) 40 MG tablet Take 1 tablet (40 mg total) by mouth daily at 6 PM. 10/26/17   [name], MD  budesonide (PULMICORT) 0.25 MG/2ML nebulizer solution Take 0.25 mg by nebulization as directed.     [provider]  donepezil (ARICEPT) 10 MG tablet TAKE 1 TABLET(10 MG) BY MOUTH AT BEDTIME 08/09/17   [name], MD  empagliflozin (JARDIANCE) 25 MG TABS tablet Take 12.5 mg by mouth daily. 01/10/18   [name], MD  escitalopram (LEXAPRO) 10 MG tablet TAKE 1 TABLET(10 MG) BY MOUTH DAILY 09/03/17   [name], MD  furosemide (LASIX) 40 MG tablet TAKE 1 TABLET BY MOUTH DAILY AS NEEDED FOR FLUID RETENTION 02/08/17   [name], MD  JANUVIA 100 MG tablet TAKE 1 TABLET BY MOUTH DAILY 07/11/18   [name], MD  loratadine (CLARITIN) 10 MG tablet Take 10 mg by mouth daily as needed for allergies.    [provider]  losartan (COZAAR) 50 MG tablet TAKE 1 TABLET BY MOUTH DAILY(DISCONTINUE VALSARTAN) 03/25/18   [name], MD  memantine (NAMENDA) 10 MG tablet TAKE 1 TABLET(10 MG) BY MOUTH TWICE DAILY 05/27/18   [name], MD  metoCLOPramide (REGLAN) 5 MG tablet TAKE 1 TABLET(5 MG) BY MOUTH FOUR TIMES DAILY BEFORE MEALS AND AT BEDTIME 07/11/18   [name], MD  metoprolol succinate (TOPROL-XL) 50 MG 24 hr tablet Take 1 tablet (50 mg total) by mouth daily. 11/05/17   [name], MD  nitroGLYCERIN (NITROSTAT) 0.4 MG SL tablet Place 1 tablet (0.4 mg total) under the tongue every 5 (five) minutes as needed for chest pain (MAX 3 TABLETS). 10/26/17   [name], MD  SYNTHROID 112 MCG tablet TAKE 1/2 TABLET BY MOUTH DAILY BEFORE BREAKFAST 07/20/18   [name], MD  XARELTO 20 MG TABS tablet TAKE 1 TABLET(20 MG) BY MOUTH DAILY WITH SUPPER 05/12/18   [name], MD    Family History Family History  Problem Relation Age of Onset   . Hypertension Mother   . Osteoporosis Mother   . Heart disease Father   . Stroke Father   . Alzheimer's disease Father   . Heart attack Father     Social History Social History   Tobacco Use  . Smoking status: Never Smoker  . Smokeless tobacco: Never Used  Substance Use Topics  . Alcohol use: No  . Drug use: No     Allergies   Contrast media [iodinated diagnostic agents]; Iodine; Shellfish allergy; and Gabapentin   Review of Systems Review of Systems  Constitutional: Negative for chills and fever.  HENT: Negative for drooling, ear pain and sore throat.   Eyes: Negative for pain and visual disturbance.  Respiratory: Negative for cough and shortness of breath.   Cardiovascular: Negative for chest pain and palpitations.  Gastrointestinal: Negative for abdominal pain, anorexia, diarrhea, nausea and vomiting.  Genitourinary: Negative  for dysuria, hematuria and urgency.  Musculoskeletal: Negative for arthralgias, back pain, falls and myalgias.  Skin: Negative for color change and rash.  Neurological: Positive for weakness and light-headedness. Negative for dizziness, seizures, loss of consciousness and syncope.  All other systems reviewed and are negative.    Physical Exam Updated Vital Signs  ED Triage Vitals [08/15/18 1728]  Enc Vitals Group     BP (!) 165/94     Pulse Rate (!) 58     Resp (!) 21     Temp 98.6 F (37 C)     Temp Source Oral     SpO2 96 %     Weight      Height      Head Circumference      Peak Flow      Pain Score      Pain Loc      Pain Edu?      Excl. in [place]?     Physical Exam Vitals signs and nursing note reviewed.  Constitutional:      General: She is not in acute distress.    Appearance: She is well-developed.  HENT:     Head: Normocephalic and atraumatic.     Nose: Nose normal.     Mouth/Throat:     Mouth: Mucous membranes are moist.  Eyes:     Extraocular Movements: Extraocular movements intact.     Conjunctiva/sclera:  Conjunctivae normal.     Pupils: Pupils are equal, round, and reactive to light.  Neck:     Musculoskeletal: Neck supple.  Cardiovascular:     Rate and Rhythm: Normal rate. Rhythm irregular.     Pulses: Normal pulses.     Heart sounds: Normal heart sounds. No murmur.  Pulmonary:     Effort: Pulmonary effort is normal. No respiratory distress.     Breath sounds: Normal breath sounds.  Abdominal:     Palpations: Abdomen is soft.     Tenderness: There is no abdominal tenderness.  Skin:    General: Skin is warm and dry.  Neurological:     General: No focal deficit present.     Mental Status: She is alert.     Cranial Nerves: No cranial nerve deficit.     Sensory: No sensory deficit.     Motor: No weakness.     Coordination: Coordination normal.     Comments: 5+/5 strength, normal sensation, no drift, normal heel to shin, normal finger to nose finger, normal speech      ED Treatments / Results  Labs (all labs ordered are listed, but only abnormal results are displayed) Labs Reviewed  COMPREHENSIVE METABOLIC PANEL - Abnormal; Notable for the following components:      Result Value   Sodium 132 (*)    Potassium 2.9 (*)    CO2 21 (*)    Glucose, Bld 193 (*)    Calcium 8.5 (*)    Albumin 3.1 (*)    All other components within normal limits  CBC WITH DIFFERENTIAL/PLATELET - Abnormal; Notable for the following components:   Monocytes Absolute 1.2 (*)    All other components within normal limits  URINALYSIS, COMPLETE (UACMP) WITH MICROSCOPIC - Abnormal; Notable for the following components:   Glucose, UA 50 (*)    Ketones, ur 5 (*)    Protein, ur >=300 (*)    Bacteria, UA RARE (*)    All other components within normal limits  CBG MONITORING, ED - Abnormal; Notable for the following  components:   Glucose-Capillary 276 (*)    All other components within normal limits  URINE CULTURE  I-STAT TROPONIN, ED    EKG EKG Interpretation  Date/Time:  Monday August 15 2018  17:30:42 EST Ventricular Rate:  56 PR Interval:    QRS Duration: 129 QT Interval:  551 QTC Calculation: 532 R Axis:   -61 Text Interpretation:  Atrial fibrillation LVH with IVCD, LAD and secondary repol abnrm Prolonged QT interval Confirmed by Lennice Sites [phone]) on 08/15/2018 5:34:42 PM   Radiology Dg Chest 2 View  Result Date: 08/15/2018 CLINICAL DATA:  83 year old female with weakness and diaphoresis. EXAM: CHEST - 2 VIEW COMPARISON:  05/26/2016 chest radiographs and earlier. FINDINGS: Semi upright AP and lateral views of the chest. Stable cardiomegaly and mediastinal contours. Prior sternotomy, CABG. Lung volumes are stable and within normal limits. No pneumothorax, pulmonary edema, pleural effusion or confluent pulmonary opacity. No acute osseous abnormality identified. Negative visible bowel gas pattern. IMPRESSION: Stable cardiomegaly. No acute cardiopulmonary abnormality. Electronically Signed   By: Genevie Ann M.D.   On: 08/15/2018 19:10   Ct Head Wo Contrast  Result Date: 08/15/2018 CLINICAL DATA:  Dizziness and lightheadedness EXAM: CT HEAD WITHOUT CONTRAST TECHNIQUE: Contiguous axial images were obtained from the base of the skull through the vertex without intravenous contrast. COMPARISON:  Head CT 09/29/2017 FINDINGS: Brain: There is no mass, hemorrhage or extra-axial collection. Mild generalized atrophy. 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 There is chronic microangiopathic white matter hypoattenuation. No acute parenchymal abnormality. Vascular: No abnormal hyperdensity of the major intracranial arteries or dural venous sinuses. No intracranial atherosclerosis. Skull: The visualized skull base, calvarium and extracranial soft tissues are normal. Sinuses/Orbits: No fluid levels or advanced mucosal thickening of the visualized paranasal sinuses. No mastoid or middle ear effusion. The orbits are normal. IMPRESSION: Atrophy and chronic small vessel disease without acute  intracranial abnormality. Electronically Signed   By: Ulyses Jarred M.D.   On: 08/15/2018 19:40    Procedures Procedures (including critical care time)  Medications Ordered in ED Medications  potassium chloride 20 MEQ/15ML (10%) solution 40 mEq (has no administration in time range)  sodium chloride 0.9 % bolus 500 mL (500 mLs Intravenous New Bag/Given 08/15/18 2201)     Initial Impression / Assessment and Plan / ED Course  I have reviewed the triage vital signs and the nursing notes.  Pertinent labs & imaging results that were available during my care of the patient were reviewed by me and considered in my medical decision making (see chart for details).     [place] P Troy is an 83 year old female history of anemia, diabetes, high cholesterol, hypertension who presents to the ED with general weakness.  Patient also with history of dementia.  Patient with normal vitals.  No fever.  Patient has had slow decline in her physical conditioning per her family.  Started to obtain some home health aide with physical therapy.  Her son is her primary caregiver.  She denies any specific chest pain, shortness of breath, abdominal pain.  No pain with urination.  No fever.  Overall patient just feels weak.  Having a harder time ambulating around the house.  Patient with overall unremarkable exam.  Neurologically intact.  No nausea, no vomiting.  Clear breath sounds.  No obvious distress.  Lab work was initiated for evaluation including head CT, chest x-ray.  Patient given small fluid bolus.  Patient with mild hypo-kalemia which was repleted orally.  EKG shows sinus rhythm.  No  signs of ischemic changes.  Troponin within normal limits.  Chest x-ray with no signs of pneumonia, pneumothorax, pleural effusion.  Head CT with no acute findings.  Patient with no other significant electrolyte abnormality, kidney injury, leukocytosis.  Urinalysis showed no signs of infection.  Case management was consulted and orders  were placed for home health aide, social work, physical therapy, Occupational Therapy.  Patient felt improved following IV fluids.  Likely chronic deconditioning ongoing from dementia.  Given return precautions and discharged from ED in good condition.  Recommend follow-up with primary care doctor.  This chart was dictated using voice recognition software.  Despite best efforts to proofread,  errors can occur which can change the documentation meaning.   Final Clinical Impressions(s) / ED Diagnoses   Final diagnoses:  Weakness    ED Discharge Orders    None       Lennice Sites, DO 08/15/18 2250

## 2018-08-17 ENCOUNTER — Other Ambulatory Visit: Payer: Self-pay | Admitting: Family Medicine

## 2018-08-17 ENCOUNTER — Encounter: Payer: Self-pay | Admitting: Family Medicine

## 2018-08-17 ENCOUNTER — Telehealth: Payer: Self-pay | Admitting: *Deleted

## 2018-08-17 DIAGNOSIS — F0280 Dementia in other diseases classified elsewhere without behavioral disturbance: Secondary | ICD-10-CM | POA: Diagnosis not present

## 2018-08-17 DIAGNOSIS — Z7982 Long term (current) use of aspirin: Secondary | ICD-10-CM | POA: Diagnosis not present

## 2018-08-17 DIAGNOSIS — Z8673 Personal history of transient ischemic attack (TIA), and cerebral infarction without residual deficits: Secondary | ICD-10-CM | POA: Diagnosis not present

## 2018-08-17 DIAGNOSIS — I5031 Acute diastolic (congestive) heart failure: Secondary | ICD-10-CM | POA: Diagnosis not present

## 2018-08-17 DIAGNOSIS — Z951 Presence of aortocoronary bypass graft: Secondary | ICD-10-CM | POA: Diagnosis not present

## 2018-08-17 DIAGNOSIS — E039 Hypothyroidism, unspecified: Secondary | ICD-10-CM | POA: Diagnosis not present

## 2018-08-17 DIAGNOSIS — Z7901 Long term (current) use of anticoagulants: Secondary | ICD-10-CM | POA: Diagnosis not present

## 2018-08-17 DIAGNOSIS — E1151 Type 2 diabetes mellitus with diabetic peripheral angiopathy without gangrene: Secondary | ICD-10-CM | POA: Diagnosis not present

## 2018-08-17 DIAGNOSIS — Z7984 Long term (current) use of oral hypoglycemic drugs: Secondary | ICD-10-CM | POA: Diagnosis not present

## 2018-08-17 DIAGNOSIS — I480 Paroxysmal atrial fibrillation: Secondary | ICD-10-CM | POA: Diagnosis not present

## 2018-08-17 DIAGNOSIS — G301 Alzheimer's disease with late onset: Secondary | ICD-10-CM | POA: Diagnosis not present

## 2018-08-17 DIAGNOSIS — I2510 Atherosclerotic heart disease of native coronary artery without angina pectoris: Secondary | ICD-10-CM | POA: Diagnosis not present

## 2018-08-17 DIAGNOSIS — K219 Gastro-esophageal reflux disease without esophagitis: Secondary | ICD-10-CM | POA: Diagnosis not present

## 2018-08-17 DIAGNOSIS — J449 Chronic obstructive pulmonary disease, unspecified: Secondary | ICD-10-CM | POA: Diagnosis not present

## 2018-08-17 DIAGNOSIS — I110 Hypertensive heart disease with heart failure: Secondary | ICD-10-CM | POA: Diagnosis not present

## 2018-08-17 LAB — URINE CULTURE: Culture: NO GROWTH

## 2018-08-17 NOTE — Telephone Encounter (Signed)
Left message for patient to call regarding appointment for 08/18/18 at 1:30pm--- [name], PA)hoping the patient can come in at 12 noon instead

## 2018-08-18 ENCOUNTER — Encounter: Payer: Self-pay | Admitting: Cardiology

## 2018-08-18 ENCOUNTER — Ambulatory Visit: Payer: Medicare Other | Admitting: Cardiology

## 2018-08-18 ENCOUNTER — Ambulatory Visit (INDEPENDENT_AMBULATORY_CARE_PROVIDER_SITE_OTHER): Payer: Medicare Other | Admitting: Cardiology

## 2018-08-18 ENCOUNTER — Ambulatory Visit (INDEPENDENT_AMBULATORY_CARE_PROVIDER_SITE_OTHER): Payer: Medicare Other

## 2018-08-18 VITALS — BP 127/76 | HR 62 | Ht 69.0 in | Wt 192.0 lb

## 2018-08-18 DIAGNOSIS — R5383 Other fatigue: Secondary | ICD-10-CM

## 2018-08-18 DIAGNOSIS — R531 Weakness: Secondary | ICD-10-CM

## 2018-08-18 DIAGNOSIS — I480 Paroxysmal atrial fibrillation: Secondary | ICD-10-CM

## 2018-08-18 MED ORDER — ATORVASTATIN CALCIUM 40 MG PO TABS: 40.0000 mg | ORAL_TABLET | Freq: Every day | ORAL | 2 refills | Status: DC

## 2018-08-18 NOTE — Patient Instructions (Addendum)
Medication Instructions:  Your physician recommends that you continue on your current medications as directed. Please refer to the Current Medication list given to you today. If you need a refill on your cardiac medications before your next appointment, please call your pharmacy.   Lab work: Your physician recommends that you return for lab work in: TODAY-BMET, [facility] If you have labs (blood work) drawn today and your tests are completely normal, you will receive your results only by: Marland Kitchen MyChart Message (if you have MyChart) OR . A paper copy in the mail If you have any lab test that is abnormal or we need to change your treatment, we will call you to review the results.  Testing/Procedures: Your physician has recommended that you wear an 7 DAY event monitor. Event monitors are medical devices that record the heart's electrical activity. Doctors most often [place] these monitors to diagnose arrhythmias. Arrhythmias are problems with the speed or rhythm of the heartbeat. The monitor is a small, portable device. You can wear one while you do your normal daily activities. This is usually used to diagnose what is causing palpitations/syncope (passing out). MONITOR WILL BE PLACED AT [address]    Follow-Up: At [facility], you and your health needs are our priority.  As part of our continuing mission to provide you with exceptional heart care, we have created designated Provider Care Teams.  These Care Teams include your primary Cardiologist (physician) and Advanced Practice Providers (APPs -  Physician Assistants and Nurse Practitioners) who all work together to provide you with the care you need, when you need it.  . Your physician recommends that you schedule a follow-up appointment in: 2-3 [place], PA-C (HOUR SLOT)  Any Other Special Instructions Will Be Listed Below (If Applicable).

## 2018-08-18 NOTE — Progress Notes (Signed)
08/18/2018 Lindsay Nguyen   March 20, 1937  [id number]  Primary Physician Lindsay Frizzle, MD Primary Cardiologist: Dr Lindsay Nguyen  HPI:  83 year old, female accompanied by her son and daughter. Her son Lindsay Nguyen was an Therapist, sports on unit 2000 at [employer] and her daughter is a NP in [place].   The pt had a NSTEMI March 21, 2010 and had an LAD BMS placed. Because of a generous thoracic aorta on cath she was seen by Dr. Gilford Nguyen for evaluation. She was cath'd later in November 2011 revealing aggressive "in-stent restenosis" within the proximal LAD stent, as well as in her diagonal branch and AV groove circumflex. She ultimately underwent CABG x 05 Jun 2010 by Dr. Gilford Nguyen with LIMA to her LAD, SVG-Dx, and an SVG-OM with resection and grafting of her thoracic aortic aneurysm. She had a Myoview performed November 04, 2010, which was nonischemic. She developed symptomatic atrial fibrillation in Feb 2017. She had a DCCV 09/30/15 to NSR, sinus bradycardia.Marland Kitchen   She was admitted to the hospital in [facility] 10/15/15-10/17/15 with acute respiratory failure which apparently was a combination of COPD exacerbation and diastolic CHF. Her echo there showed pulmonary HTN but a f/u echo in April 2017 showed normal pulmonary pressures and normal LVF. A sleep study in April 2017 was negative for sleep apnea.   When she saw Dr Lindsay Nguyen in Oct 2017 she was noted to be in AF and symptomatic. She underwent another DCCV 06/15/16 and was followed up in the AF clinic. Antiarrhythmics and further therapy was discussed.  . Dr Lindsay Nguyen and he feelt best antiarrythmic, due to baseline conduction issues would be Tikosyn. However, she is on an antidepressant and this in combo with Tiksoyn would cause concern for prolonged QTC. The pt/son stated then that it would be impossible for pt to stop antidepressant  She would not be an ablation candidate for fear of causingsevere AV Block and resulting in need for a pacemaker, which Dr. Rayann Nguyen feels that  this may be in her future at some point.   She is in the office today with her daughter and son. The patient has had persistent decline and is not very active at home. She is DNR. She was brought to the ED 09/02/2018 by her family with weakness.  Her K+ was 2.9 and she was in AF and bradycardic- HR 45-55 -but no other significant findings.  Since then they had held her Toprol for a few doses. Last PM her HR was in the 80's and they resumed it.     Current Outpatient Medications  Medication Sig Dispense Refill  . albuterol (PROVENTIL) (2.5 MG/3ML) 0.083% nebulizer solution INHALE 1 NEBULE VIA NEBULIZER Q 2 HOURS PRN FOR WHEEZING OR SHORTNESS OF BREATH  5  . amLODipine (NORVASC) 2.5 MG tablet TAKE 1 TABLET(2.5 MG) BY MOUTH DAILY 90 tablet 3  . aspirin EC 81 MG tablet Take 1 tablet (81 mg total) by mouth daily. 90 tablet 3  . atorvastatin (LIPITOR) 40 MG tablet Take 1 tablet (40 mg total) by mouth daily at 6 PM. 90 tablet 2  . budesonide (PULMICORT) 0.25 MG/2ML nebulizer solution Take 0.25 mg by nebulization as directed.     . donepezil (ARICEPT) 10 MG tablet TAKE 1 TABLET(10 MG) BY MOUTH AT BEDTIME 90 tablet 3  . empagliflozin (JARDIANCE) 25 MG TABS tablet Take 12.5 mg by mouth daily. 30 tablet 3  . escitalopram (LEXAPRO) 10 MG tablet TAKE 1 TABLET(10 MG) BY MOUTH DAILY 90 tablet  3  . furosemide (LASIX) 40 MG tablet TAKE 1 TABLET BY MOUTH DAILY AS NEEDED FOR FLUID RETENTION 90 tablet 1  . JANUVIA 100 MG tablet TAKE 1 TABLET BY MOUTH DAILY 90 tablet 0  . loratadine (CLARITIN) 10 MG tablet Take 10 mg by mouth daily as needed for allergies.    Marland Kitchen losartan (COZAAR) 50 MG tablet TAKE 1 TABLET BY MOUTH DAILY(DISCONTINUE VALSARTAN) 90 tablet 1  . memantine (NAMENDA) 10 MG tablet TAKE 1 TABLET(10 MG) BY MOUTH TWICE DAILY 60 tablet 0  . metoCLOPramide (REGLAN) 5 MG tablet TAKE 1 TABLET(5 MG) BY MOUTH FOUR TIMES DAILY BEFORE MEALS AND AT BEDTIME 120 tablet 3  . metoprolol succinate (TOPROL-XL) 50 MG 24 hr  tablet Take 1 tablet (50 mg total) by mouth daily. 90 tablet 3  . nitroGLYCERIN (NITROSTAT) 0.4 MG SL tablet Place 1 tablet (0.4 mg total) under the tongue every 5 (five) minutes as needed for chest pain (MAX 3 TABLETS). 25 tablet 4  . SYNTHROID 112 MCG tablet TAKE 1/2 TABLET BY MOUTH DAILY BEFORE BREAKFAST 90 tablet 0  . XARELTO 20 MG TABS tablet TAKE 1 TABLET(20 MG) BY MOUTH DAILY WITH SUPPER 90 tablet 3   No current facility-administered medications for this visit.     Allergies  Allergen Reactions  . Contrast Media [Iodinated Diagnostic Agents] Anaphylaxis and Swelling  . Iodine Anaphylaxis and Swelling  . Shellfish Allergy Anaphylaxis    unknown  . Gabapentin Hives    Past Medical History:  Diagnosis Date  . Anemia   . Atrial fibrillation ([place])    persistent; on Eliquis  . CAD (coronary artery disease)    2D ECHO, 11/04/2010 - EF >55%, mild-moderate mitral regurgitation, moderate tricuspid regurgitation  . Dementia ([place])   . Diabetes mellitus   . GERD (gastroesophageal reflux disease)   . Hyperlipidemia   . Hypertension   . Non-STEMI (non-ST elevated myocardial infarction) ([facility])    NUCLEAR STRESS TEST, 11/04/2010 - normal, no ischemia  . Osteopenia   . Pain in limb    LEA VENOUS DUPLEX, 06/27/2010 - no evidence of DVT  . Stroke ([place])   . Thoracic aortic aneurysm ([facility])    status post resection and grafting  . Thyroid disease    hypothyroid    Social History   Socioeconomic History  . Marital status: Married    Spouse name: Not on file  . Number of children: Not on file  . Years of education: Not on file  . Highest education level: Not on file  Occupational History  . Not on file  Social Needs  . Financial resource strain: Not on file  . Food insecurity:    Worry: Not on file    Inability: Not on file  . Transportation needs:    Medical: Not on file    Non-medical: Not on file  Tobacco Use  . Smoking status: Never Smoker  . Smokeless tobacco: Never Used    Substance and Sexual Activity  . Alcohol use: No  . Drug use: No  . Sexual activity: Not on file  Lifestyle  . Physical activity:    Days per week: Not on file    Minutes per session: Not on file  . Stress: Not on file  Relationships  . Social connections:    Talks on phone: Not on file    Gets together: Not on file    Attends religious service: Not on file    Active member of club or organization:  Not on file    Attends meetings of clubs or organizations: Not on file    Relationship status: Not on file  . Intimate partner violence:    Fear of current or ex partner: Not on file    Emotionally abused: Not on file    Physically abused: Not on file    Forced sexual activity: Not on file  Other Topics Concern  . Not on file  Social History Narrative   Patient's daughter is a Designer, jewellery who does hospitalist work at [employer].  Her son is also in health care.     Family History  Problem Relation Age of Onset  . Hypertension Mother   . Osteoporosis Mother   . Heart disease Father   . Stroke Father   . Alzheimer's disease Father   . Heart attack Father      Review of Systems: General: negative for chills, fever, night sweats or weight changes.  Cardiovascular: negative for chest pain, dyspnea on exertion, edema, orthopnea, palpitations, paroxysmal nocturnal dyspnea or shortness of breath Dermatological: negative for rash Respiratory: negative for cough or wheezing Urologic: negative for hematuria Abdominal: negative for nausea, vomiting, diarrhea, bright red blood per rectum, melena, or hematemesis Neurologic: negative for visual changes, syncope, or dizziness All other systems reviewed and are otherwise negative except as noted above.    Blood pressure 127/76, pulse 62, height 5\' 9"  (1.753 m), weight 192 lb (87.1 kg).  General appearance: alert, cooperative, appears stated age, no distress and in wheel chair, chronically ill appearing Lungs: clear to  auscultation bilaterally Heart: irregularly irregular rhythm Extremities: trace edema Skin: pale, warm, dry Neurologic: Grossly normal  EKG AF, LBBB, VR 57  ASSESSMENT AND PLAN:   Weakness Seen in ED 08/15/2018- K+ 2.9, HR slow. I suggested we decrease her Toprol to 25 mg daily and hold for HR less than 55. Check BMP, Mg++, and TSH  PAF (paroxysmal atrial fibrillation) ([place]) She has been in AF the last few office visits  Hx of CABG Hx of CABG x 3 with thoracic aneurysm repair 9476  Acute diastolic (congestive) heart failure ([place]) History of diastolic CHF in 5465- echo July 2019- EF 60-65%  Chronic anticoagulation Eliquis- CHA2Ds2VASc=7  History of depression Hx of depression and mild cognitive deficit on Lexapro and Aricept  PLAN  I think some of her fatigue may be secondary to AF with bradycardia. I will decrease her Toprol with hold parameters. Check TSH, BMP, and Mg++.  I'll see her back in 1-2 weeks.   [name] PA-C 08/18/2018 3:39 PM

## 2018-08-18 NOTE — Assessment & Plan Note (Signed)
Seen in ED 08/15/2018- K+ 2.9, HR slow. I suggested we decrease her Toprol to 25 mg daily and hold for HR less than 55. Check BMP, Mg++, and TSH

## 2018-08-19 ENCOUNTER — Inpatient Hospital Stay (HOSPITAL_COMMUNITY)
Admission: EM | Admit: 2018-08-19 | Discharge: 2018-08-26 | DRG: 864 | Disposition: A | Discharge status: Skilled Nursing Facility | Payer: Medicare Other | Attending: Internal Medicine | Admitting: Internal Medicine

## 2018-08-19 ENCOUNTER — Emergency Department (HOSPITAL_COMMUNITY): Payer: Medicare Other

## 2018-08-19 ENCOUNTER — Encounter (HOSPITAL_COMMUNITY): Payer: Self-pay | Admitting: Emergency Medicine

## 2018-08-19 ENCOUNTER — Other Ambulatory Visit: Payer: Self-pay

## 2018-08-19 ENCOUNTER — Telehealth: Payer: Self-pay | Admitting: Family Medicine

## 2018-08-19 DIAGNOSIS — R509 Fever, unspecified: Principal | ICD-10-CM | POA: Diagnosis present

## 2018-08-19 DIAGNOSIS — I2510 Atherosclerotic heart disease of native coronary artery without angina pectoris: Secondary | ICD-10-CM | POA: Diagnosis present

## 2018-08-19 DIAGNOSIS — I712 Thoracic aortic aneurysm, without rupture: Secondary | ICD-10-CM | POA: Diagnosis present

## 2018-08-19 DIAGNOSIS — Z7401 Bed confinement status: Secondary | ICD-10-CM | POA: Diagnosis not present

## 2018-08-19 DIAGNOSIS — Z91013 Allergy to seafood: Secondary | ICD-10-CM

## 2018-08-19 DIAGNOSIS — Z91041 Radiographic dye allergy status: Secondary | ICD-10-CM

## 2018-08-19 DIAGNOSIS — Z79890 Hormone replacement therapy (postmenopausal): Secondary | ICD-10-CM | POA: Diagnosis not present

## 2018-08-19 DIAGNOSIS — Z9049 Acquired absence of other specified parts of digestive tract: Secondary | ICD-10-CM

## 2018-08-19 DIAGNOSIS — K3184 Gastroparesis: Secondary | ICD-10-CM | POA: Diagnosis present

## 2018-08-19 DIAGNOSIS — I480 Paroxysmal atrial fibrillation: Secondary | ICD-10-CM | POA: Diagnosis not present

## 2018-08-19 DIAGNOSIS — F0280 Dementia in other diseases classified elsewhere without behavioral disturbance: Secondary | ICD-10-CM | POA: Diagnosis not present

## 2018-08-19 DIAGNOSIS — E876 Hypokalemia: Secondary | ICD-10-CM | POA: Diagnosis present

## 2018-08-19 DIAGNOSIS — M2550 Pain in unspecified joint: Secondary | ICD-10-CM | POA: Diagnosis not present

## 2018-08-19 DIAGNOSIS — Z951 Presence of aortocoronary bypass graft: Secondary | ICD-10-CM | POA: Diagnosis not present

## 2018-08-19 DIAGNOSIS — Z7982 Long term (current) use of aspirin: Secondary | ICD-10-CM | POA: Diagnosis not present

## 2018-08-19 DIAGNOSIS — I081 Rheumatic disorders of both mitral and tricuspid valves: Secondary | ICD-10-CM | POA: Diagnosis present

## 2018-08-19 DIAGNOSIS — I4891 Unspecified atrial fibrillation: Secondary | ICD-10-CM | POA: Diagnosis not present

## 2018-08-19 DIAGNOSIS — E039 Hypothyroidism, unspecified: Secondary | ICD-10-CM | POA: Diagnosis present

## 2018-08-19 DIAGNOSIS — Z888 Allergy status to other drugs, medicaments and biological substances status: Secondary | ICD-10-CM

## 2018-08-19 DIAGNOSIS — R6520 Severe sepsis without septic shock: Secondary | ICD-10-CM

## 2018-08-19 DIAGNOSIS — R5381 Other malaise: Secondary | ICD-10-CM | POA: Diagnosis not present

## 2018-08-19 DIAGNOSIS — E1151 Type 2 diabetes mellitus with diabetic peripheral angiopathy without gangrene: Secondary | ICD-10-CM | POA: Diagnosis present

## 2018-08-19 DIAGNOSIS — R531 Weakness: Secondary | ICD-10-CM

## 2018-08-19 DIAGNOSIS — R0902 Hypoxemia: Secondary | ICD-10-CM | POA: Diagnosis not present

## 2018-08-19 DIAGNOSIS — Z7901 Long term (current) use of anticoagulants: Secondary | ICD-10-CM

## 2018-08-19 DIAGNOSIS — I482 Chronic atrial fibrillation, unspecified: Secondary | ICD-10-CM | POA: Diagnosis not present

## 2018-08-19 DIAGNOSIS — I4819 Other persistent atrial fibrillation: Secondary | ICD-10-CM | POA: Diagnosis present

## 2018-08-19 DIAGNOSIS — A419 Sepsis, unspecified organism: Secondary | ICD-10-CM | POA: Diagnosis present

## 2018-08-19 DIAGNOSIS — Z7984 Long term (current) use of oral hypoglycemic drugs: Secondary | ICD-10-CM

## 2018-08-19 DIAGNOSIS — Z66 Do not resuscitate: Secondary | ICD-10-CM | POA: Diagnosis present

## 2018-08-19 DIAGNOSIS — F0390 Unspecified dementia without behavioral disturbance: Secondary | ICD-10-CM | POA: Diagnosis present

## 2018-08-19 DIAGNOSIS — Z8673 Personal history of transient ischemic attack (TIA), and cerebral infarction without residual deficits: Secondary | ICD-10-CM

## 2018-08-19 DIAGNOSIS — I252 Old myocardial infarction: Secondary | ICD-10-CM

## 2018-08-19 DIAGNOSIS — E785 Hyperlipidemia, unspecified: Secondary | ICD-10-CM | POA: Diagnosis present

## 2018-08-19 DIAGNOSIS — E1143 Type 2 diabetes mellitus with diabetic autonomic (poly)neuropathy: Secondary | ICD-10-CM | POA: Diagnosis present

## 2018-08-19 DIAGNOSIS — Z91048 Other nonmedicinal substance allergy status: Secondary | ICD-10-CM

## 2018-08-19 DIAGNOSIS — E1165 Type 2 diabetes mellitus with hyperglycemia: Secondary | ICD-10-CM | POA: Diagnosis not present

## 2018-08-19 DIAGNOSIS — I110 Hypertensive heart disease with heart failure: Secondary | ICD-10-CM | POA: Diagnosis present

## 2018-08-19 DIAGNOSIS — B349 Viral infection, unspecified: Secondary | ICD-10-CM | POA: Diagnosis not present

## 2018-08-19 DIAGNOSIS — Z79899 Other long term (current) drug therapy: Secondary | ICD-10-CM

## 2018-08-19 DIAGNOSIS — R7989 Other specified abnormal findings of blood chemistry: Secondary | ICD-10-CM | POA: Diagnosis not present

## 2018-08-19 DIAGNOSIS — I5032 Chronic diastolic (congestive) heart failure: Secondary | ICD-10-CM | POA: Diagnosis present

## 2018-08-19 DIAGNOSIS — I10 Essential (primary) hypertension: Secondary | ICD-10-CM | POA: Diagnosis not present

## 2018-08-19 DIAGNOSIS — K219 Gastro-esophageal reflux disease without esophagitis: Secondary | ICD-10-CM | POA: Diagnosis present

## 2018-08-19 DIAGNOSIS — Z7951 Long term (current) use of inhaled steroids: Secondary | ICD-10-CM

## 2018-08-19 DIAGNOSIS — Z8249 Family history of ischemic heart disease and other diseases of the circulatory system: Secondary | ICD-10-CM

## 2018-08-19 DIAGNOSIS — R740 Nonspecific elevation of levels of transaminase and lactic acid dehydrogenase [LDH]: Secondary | ICD-10-CM | POA: Diagnosis not present

## 2018-08-19 DIAGNOSIS — Z823 Family history of stroke: Secondary | ICD-10-CM

## 2018-08-19 LAB — URINALYSIS, ROUTINE W REFLEX MICROSCOPIC
Bacteria, UA: NONE SEEN
Bilirubin Urine: NEGATIVE
Ketones, ur: 5 mg/dL — AB
Leukocytes, UA: NEGATIVE
Nitrite: NEGATIVE
Protein, ur: 100 mg/dL — AB
Specific Gravity, Urine: 1.029 (ref 1.005–1.030)
pH: 5 (ref 5.0–8.0)

## 2018-08-19 LAB — COMPREHENSIVE METABOLIC PANEL
ALBUMIN: 3.5 g/dL (ref 3.5–5.0)
ALT: 14 U/L (ref 0–44)
AST: 19 U/L (ref 15–41)
Alkaline Phosphatase: 67 U/L (ref 38–126)
Anion gap: 11 (ref 5–15)
BUN: 16 mg/dL (ref 8–23)
CO2: 24 mmol/L (ref 22–32)
Calcium: 9.2 mg/dL (ref 8.9–10.3)
Chloride: 102 mmol/L (ref 98–111)
Creatinine, Ser: 1.05 mg/dL — ABNORMAL HIGH (ref 0.44–1.00)
GFR calc Af Amer: 58 mL/min — ABNORMAL LOW (ref >60)
GFR calc non Af Amer: 50 mL/min — ABNORMAL LOW (ref >60)
Glucose, Bld: 357 mg/dL — ABNORMAL HIGH (ref 70–99)
Potassium: 3.9 mmol/L (ref 3.5–5.1)
Sodium: 137 mmol/L (ref 135–145)
Total Bilirubin: 0.8 mg/dL (ref 0.3–1.2)
Total Protein: 7.4 g/dL (ref 6.5–8.1)

## 2018-08-19 LAB — I-STAT CG4 LACTIC ACID, ED: Lactic Acid, Venous: 2.72 mmol/L (ref 0.5–1.9)

## 2018-08-19 LAB — MRSA PCR SCREENING: MRSA by PCR: NEGATIVE

## 2018-08-19 LAB — I-STAT TROPONIN, ED: Troponin i, poc: 0.01 ng/mL (ref 0.00–0.08)

## 2018-08-19 LAB — CBC WITH DIFFERENTIAL/PLATELET
Abs Immature Granulocytes: 0.09 10*3/uL — ABNORMAL HIGH (ref 0.00–0.07)
Basophils Absolute: 0 10*3/uL (ref 0.0–0.1)
Basophils Relative: 0 %
Eosinophils Absolute: 0 10*3/uL (ref 0.0–0.5)
Eosinophils Relative: 0 %
HCT: 42.9 % (ref 36.0–46.0)
Hemoglobin: 14.5 g/dL (ref 12.0–15.0)
Immature Granulocytes: 1 %
Lymphocytes Relative: 4 %
Lymphs Abs: 0.8 10*3/uL (ref 0.7–4.0)
MCH: 32.5 pg (ref 26.0–34.0)
MCHC: 33.8 g/dL (ref 30.0–36.0)
MCV: 96.2 fL (ref 80.0–100.0)
Monocytes Absolute: 1.8 10*3/uL — ABNORMAL HIGH (ref 0.1–1.0)
Monocytes Relative: 9 %
NEUTROS PCT: 86 %
Neutro Abs: 16.6 10*3/uL — ABNORMAL HIGH (ref 1.7–7.7)
Platelets: 193 10*3/uL (ref 150–400)
RBC: 4.46 MIL/uL (ref 3.87–5.11)
RDW: 12.4 % (ref 11.5–15.5)
WBC: 19.4 10*3/uL — ABNORMAL HIGH (ref 4.0–10.5)
nRBC: 0 % (ref 0.0–0.2)

## 2018-08-19 LAB — GLUCOSE, CAPILLARY
Glucose-Capillary: 308 mg/dL — ABNORMAL HIGH (ref 70–99)
Glucose-Capillary: 336 mg/dL — ABNORMAL HIGH (ref 70–99)
Glucose-Capillary: 172 mg/dL — ABNORMAL HIGH (ref 70–99)
Glucose-Capillary: 203 mg/dL — ABNORMAL HIGH (ref 70–99)

## 2018-08-19 LAB — LACTIC ACID, PLASMA
Lactic Acid, Venous: 2.1 mmol/L (ref 0.5–1.9)
Lactic Acid, Venous: 2.2 mmol/L (ref 0.5–1.9)

## 2018-08-19 LAB — BASIC METABOLIC PANEL
BUN/Creatinine Ratio: 17 (ref 12–28)
BUN: 17 mg/dL (ref 8–27)
CO2: 26 mmol/L (ref 20–29)
Calcium: 9.1 mg/dL (ref 8.7–10.3)
Chloride: 95 mmol/L — ABNORMAL LOW (ref 96–106)
Creatinine, Ser: 1 mg/dL (ref 0.57–1.00)
GFR calc Af Amer: 61 mL/min/{1.73_m2} (ref >59)
GFR calc non Af Amer: 53 mL/min/{1.73_m2} — ABNORMAL LOW (ref >59)
Glucose: 312 mg/dL — ABNORMAL HIGH (ref 65–99)
Potassium: 4 mmol/L (ref 3.5–5.2)
Sodium: 138 mmol/L (ref 134–144)

## 2018-08-19 LAB — INFLUENZA PANEL BY PCR (TYPE A & B)
Influenza A By PCR: NEGATIVE
Influenza B By PCR: NEGATIVE

## 2018-08-19 LAB — HEMOGLOBIN A1C
Hgb A1c MFr Bld: 8.9 % — ABNORMAL HIGH (ref 4.8–5.6)
Mean Plasma Glucose: 208.73 mg/dL

## 2018-08-19 LAB — TSH: TSH: 1.62 u[IU]/mL (ref 0.450–4.500)

## 2018-08-19 LAB — PROCALCITONIN: Procalcitonin: <0.1 ng/mL

## 2018-08-19 LAB — MAGNESIUM: Magnesium: 1.6 mg/dL (ref 1.6–2.3)

## 2018-08-19 MED ORDER — METOPROLOL SUCCINATE ER 25 MG PO TB24
25.0000 mg | ORAL_TABLET | Freq: Every day | ORAL | Status: DC
  Administered 2018-08-19 – 2018-08-26 (×8): 25 mg via ORAL
  Filled 2018-08-19 (×9): qty 1

## 2018-08-19 MED ORDER — SODIUM CHLORIDE 0.9 % IV SOLN
INTRAVENOUS | Status: DC
  Administered 2018-08-19 – 2018-08-20 (×3): via INTRAVENOUS

## 2018-08-19 MED ORDER — VANCOMYCIN HCL IN DEXTROSE 1-5 GM/200ML-% IV SOLN: 1000.0000 mg | Freq: Once | INTRAVENOUS | Status: DC

## 2018-08-19 MED ORDER — INSULIN ASPART 100 UNIT/ML ~~LOC~~ SOLN: 0.0000 [IU] | Freq: Three times a day (TID) | SUBCUTANEOUS | Status: DC

## 2018-08-19 MED ORDER — NITROGLYCERIN 0.4 MG SL SUBL: 0.4000 mg | SUBLINGUAL_TABLET | SUBLINGUAL | Status: DC | PRN

## 2018-08-19 MED ORDER — VANCOMYCIN HCL 10 G IV SOLR
1250.0000 mg | INTRAVENOUS | Status: DC
  Administered 2018-08-20 – 2018-08-21 (×2): 1250 mg via INTRAVENOUS
  Filled 2018-08-19 (×3): qty 1250

## 2018-08-19 MED ORDER — ASPIRIN EC 81 MG PO TBEC
81.0000 mg | DELAYED_RELEASE_TABLET | Freq: Every day | ORAL | Status: DC
  Administered 2018-08-19 – 2018-08-26 (×8): 81 mg via ORAL
  Filled 2018-08-19 (×9): qty 1

## 2018-08-19 MED ORDER — ONDANSETRON HCL 4 MG/2ML IJ SOLN: 4.0000 mg | Freq: Four times a day (QID) | INTRAMUSCULAR | Status: DC | PRN

## 2018-08-19 MED ORDER — BUDESONIDE 0.25 MG/2ML IN SUSP
0.2500 mg | Freq: Every day | RESPIRATORY_TRACT | Status: DC | PRN
  Filled 2018-08-19: qty 2

## 2018-08-19 MED ORDER — LORATADINE 10 MG PO TABS
10.0000 mg | ORAL_TABLET | Freq: Every day | ORAL | Status: DC
  Administered 2018-08-19 – 2018-08-26 (×8): 10 mg via ORAL
  Filled 2018-08-19 (×9): qty 1

## 2018-08-19 MED ORDER — SODIUM CHLORIDE 0.9 % IV SOLN
1.0000 g | Freq: Two times a day (BID) | INTRAVENOUS | Status: DC
  Administered 2018-08-19 – 2018-08-21 (×5): 1 g via INTRAVENOUS
  Filled 2018-08-19 (×8): qty 1

## 2018-08-19 MED ORDER — ATORVASTATIN CALCIUM 40 MG PO TABS
40.0000 mg | ORAL_TABLET | Freq: Every day | ORAL | Status: DC
  Administered 2018-08-19 – 2018-08-25 (×6): 40 mg via ORAL
  Filled 2018-08-19 (×6): qty 1

## 2018-08-19 MED ORDER — METRONIDAZOLE IN NACL 5-0.79 MG/ML-% IV SOLN
500.0000 mg | Freq: Three times a day (TID) | INTRAVENOUS | Status: DC
  Filled 2018-08-19: qty 100

## 2018-08-19 MED ORDER — ALBUTEROL SULFATE (2.5 MG/3ML) 0.083% IN NEBU
2.5000 mg | INHALATION_SOLUTION | RESPIRATORY_TRACT | Status: DC | PRN
  Administered 2018-08-20 – 2018-08-21 (×2): 2.5 mg via RESPIRATORY_TRACT
  Filled 2018-08-19 (×2): qty 3

## 2018-08-19 MED ORDER — VANCOMYCIN HCL 10 G IV SOLR
1500.0000 mg | Freq: Once | INTRAVENOUS | Status: AC
  Administered 2018-08-19: 1500 mg via INTRAVENOUS
  Filled 2018-08-19: qty 1500

## 2018-08-19 MED ORDER — ACETAMINOPHEN 650 MG RE SUPP: 650.0000 mg | Freq: Four times a day (QID) | RECTAL | Status: DC | PRN

## 2018-08-19 MED ORDER — INSULIN ASPART 100 UNIT/ML ~~LOC~~ SOLN
0.0000 [IU] | Freq: Every day | SUBCUTANEOUS | Status: DC
  Administered 2018-08-22: 2 [IU] via SUBCUTANEOUS

## 2018-08-19 MED ORDER — RIVAROXABAN 20 MG PO TABS
20.0000 mg | ORAL_TABLET | Freq: Every day | ORAL | Status: DC
  Administered 2018-08-19 – 2018-08-25 (×7): 20 mg via ORAL
  Filled 2018-08-19 (×7): qty 1

## 2018-08-19 MED ORDER — ONDANSETRON HCL 4 MG PO TABS: 4.0000 mg | ORAL_TABLET | Freq: Four times a day (QID) | ORAL | Status: DC | PRN

## 2018-08-19 MED ORDER — INSULIN DETEMIR 100 UNIT/ML ~~LOC~~ SOLN
10.0000 [IU] | Freq: Two times a day (BID) | SUBCUTANEOUS | Status: DC
  Administered 2018-08-19 – 2018-08-26 (×15): 10 [IU] via SUBCUTANEOUS
  Filled 2018-08-19 (×15): qty 0.1

## 2018-08-19 MED ORDER — SODIUM CHLORIDE 0.9 % IV BOLUS
1000.0000 mL | Freq: Once | INTRAVENOUS | Status: AC
  Administered 2018-08-19: 1000 mL via INTRAVENOUS

## 2018-08-19 MED ORDER — ACETAMINOPHEN 325 MG PO TABS
650.0000 mg | ORAL_TABLET | Freq: Four times a day (QID) | ORAL | Status: DC | PRN
  Administered 2018-08-19 – 2018-08-26 (×4): 650 mg via ORAL
  Filled 2018-08-19 (×4): qty 2

## 2018-08-19 MED ORDER — SODIUM CHLORIDE 0.9 % IV SOLN
2.0000 g | Freq: Once | INTRAVENOUS | Status: AC
  Administered 2018-08-19: 2 g via INTRAVENOUS
  Filled 2018-08-19: qty 2

## 2018-08-19 MED ORDER — INSULIN ASPART 100 UNIT/ML ~~LOC~~ SOLN
0.0000 [IU] | Freq: Three times a day (TID) | SUBCUTANEOUS | Status: DC
  Administered 2018-08-19: 3 [IU] via SUBCUTANEOUS
  Administered 2018-08-19: 11 [IU] via SUBCUTANEOUS
  Administered 2018-08-20: 5 [IU] via SUBCUTANEOUS
  Administered 2018-08-20 – 2018-08-21 (×4): 3 [IU] via SUBCUTANEOUS
  Administered 2018-08-21: 5 [IU] via SUBCUTANEOUS
  Administered 2018-08-22: 2 [IU] via SUBCUTANEOUS
  Administered 2018-08-22: 3 [IU] via SUBCUTANEOUS
  Administered 2018-08-22: 2 [IU] via SUBCUTANEOUS
  Administered 2018-08-23: 3 [IU] via SUBCUTANEOUS
  Administered 2018-08-23: 8 [IU] via SUBCUTANEOUS
  Administered 2018-08-24: 5 [IU] via SUBCUTANEOUS
  Administered 2018-08-24 – 2018-08-25 (×3): 2 [IU] via SUBCUTANEOUS
  Administered 2018-08-25: 5 [IU] via SUBCUTANEOUS
  Administered 2018-08-26 (×2): 3 [IU] via SUBCUTANEOUS

## 2018-08-19 NOTE — Progress Notes (Signed)
Physical Therapy Evaluation Patient Details Name: Lindsay Nguyen MRN: [id number] DOB: 1936/02/26 Today's Date: 08/19/2018   History of Present Illness  Patient is 83 y/o female admitted to hospital 1/17 with symptoms of fever, nausea, and weakness secondary to sepsis of unknown source. Further workup is pending. PMH includes HTN, hx of CABG, PAF, TAA, CVA, dCHF, and dementia.   Clinical Impression  Patient admitted to hospital with above problems and deficits below. Required min-modA +2 to stand using stedy from [facility] to return to bed. Patient with significant functional mobility deficits with increased unsteadiness and strength noted with standing marches and heavy reliance on UE support. Patient would benefit from SNF recommendation to regain baseline strength. Patient will continue to benefit from acute physical therapy services to maximize independence and safety with functional mobility.     Follow Up Recommendations SNF;Supervision/Assistance - 24 hour    Equipment Recommendations  None recommended by PT    Recommendations for Other Services OT consult     Precautions / Restrictions Precautions Precautions: Fall Restrictions Weight Bearing Restrictions: No      Mobility  Bed Mobility Overal bed mobility: Needs Assistance Bed Mobility: Sit to Supine       Sit to supine: Mod assist   General bed mobility comments: Required modA for LE asssit and for positioning of shoulder upon return to supine.   Transfers Overall transfer level: Needs assistance   Transfers: Sit to/from Stand Sit to Stand: Min assist;Mod assist;+2 physical assistance         General transfer comment: Patient required min-modA +2 standing from low surface and min guard from steady height surface for safety.   Ambulation/Gait                Stairs            Wheelchair Mobility    Modified Rankin (Stroke Patients Only)       Balance Overall balance assessment: Needs  assistance Sitting-balance support: Feet supported Sitting balance-Leahy Scale: Fair     Standing balance support: Bilateral upper extremity supported Standing balance-Leahy Scale: Poor Standing balance comment: reliant on BUE while standing to maintain balance. Completed standing marches x2 BLE using stedy with significant difficulty and unsteadiness                             Pertinent Vitals/Pain Pain Assessment: Faces Faces Pain Scale: No hurt    Home Living Family/patient expects to be discharged to:: Private residence Living Arrangements: Children(lives with son) Available Help at Discharge: Family;Available 24 hours/day Type of Home: House Home Access: Stairs to enter Entrance Stairs-Rails: Right;Left(can't reach both rails at same time) Entrance Stairs-Number of Steps: 5 Home Layout: One level Home Equipment: Tub bench;Walker - 2 wheels      Prior Function Level of Independence: Needs assistance   Gait / Transfers Assistance Needed:  Prior to last 24hours patient ambulated with RW. Last 24 hours patient required maxA to total A from daughter and son to transfer to [facility] and bed due to increased weakness.   ADL's / Homemaking Assistance Needed: daughter assisted with some ADLs including bathing.        Hand Dominance        Extremity/Trunk Assessment   Upper Extremity Assessment Upper Extremity Assessment: Defer to OT evaluation    Lower Extremity Assessment Lower Extremity Assessment: RLE deficits/detail;LLE deficits/detail;Generalized weakness RLE Deficits / Details: Unable to hold SLR against resistance in supine. Functional  weakness noted, as pt with difficulty performing marching task in standing secondary to weakness.  LLE Deficits / Details: Unable to hold SLR against resistance in supine. Functional weakness noted, as pt with difficulty performing marching task in standing secondary to weakness.     Cervical / Trunk Assessment Cervical /  Trunk Assessment: Normal  Communication   Communication: No difficulties  Cognition Arousal/Alertness: Awake/alert Behavior During Therapy: WFL for tasks assessed/performed Overall Cognitive Status: History of cognitive impairments - at baseline                                 General Comments: dementia at baseline       General Comments      Exercises General Exercises - Lower Extremity Hip Flexion/Marching: AROM;Both;Other reps (comment);Standing(Standing marching using stedy)   Assessment/Plan    PT Assessment Patient needs continued PT services  PT Problem List Decreased strength;Decreased range of motion;Decreased activity tolerance;Decreased balance;Decreased mobility;Decreased cognition;Decreased knowledge of use of DME;Decreased safety awareness       PT Treatment Interventions DME instruction;Gait training;Stair training;Functional mobility training;Therapeutic activities;Therapeutic exercise;Balance training;Patient/family education;Cognitive remediation    PT Goals (Current goals can be found in the Care Plan section)  Acute Rehab PT Goals Patient Stated Goal: go home PT Goal Formulation: With patient Time For Goal Achievement: 09/02/18 Potential to Achieve Goals: Fair    Frequency Min 3X/week   Barriers to discharge        Co-evaluation               AM-PAC PT "6 Clicks" Mobility  Outcome Measure Help needed turning from your back to your side while in a flat bed without using bedrails?: A Lot Help needed moving from lying on your back to sitting on the side of a flat bed without using bedrails?: A Lot Help needed moving to and from a bed to a chair (including a wheelchair)?: A Lot Help needed standing up from a chair using your arms (e.g., wheelchair or bedside chair)?: A Lot Help needed to walk in hospital room?: Total Help needed climbing 3-5 steps with a railing? : Total 6 Click Score: 10    End of Session   Activity Tolerance:  Patient tolerated treatment well Patient left: in bed;with call bell/phone within reach;with nursing/sitter in room;with family/visitor present Nurse Communication: Mobility status PT Visit Diagnosis: Unsteadiness on feet (R26.81);Muscle weakness (generalized) (M62.81);Difficulty in walking, not elsewhere classified (R26.2)    Time: 1710-1734 PT Time Calculation (min) (ACUTE ONLY): 24 min   Charges:   PT Evaluation $PT Eval Moderate Complexity: 1 Mod PT Treatments $Therapeutic Exercise: 8-22 mins        Erick Blinks, SPT   Erick Blinks 08/19/2018, 6:30 PM

## 2018-08-19 NOTE — H&P (Signed)
History and Physical    Lindsay Kiner OEU:[id number] DOB: 26-Apr-1936 DOA: 08/19/2018  PCP: [name], MD  Patient coming from: home  Chief Complaint:  weakness  HPI: Lindsay Nguyen is a 83 y.o. female with medical history significant of coronary artery disease, diabetes, [name] comes in with weakness today with fever and nausea.  Patient has not been feeling well for several days.  She denies any shortness of breath or cough.  She has been nauseous without vomiting.  She is denies any rashes on her body.  She denies any diarrhea.  She denies any abdominal pain or chest pain.  She denies any nasal congestion.  She just feels awful and weak.  Patient is being referred for admission for fever over 101 of unclear source.  Both her son and daughter present her daughter is a Designer, jewellery at [employer] and her son is a retired Marine scientist.  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  Review of Systems: As per HPI otherwise 10 point review of systems negative.   Past Medical History:  Diagnosis Date  . Anemia   . Atrial fibrillation ([place])    persistent; on Eliquis  . CAD (coronary artery disease)    2D ECHO, 11/04/2010 - EF >55%, mild-moderate mitral regurgitation, moderate tricuspid regurgitation  . Dementia ([place])   . Diabetes mellitus   . GERD (gastroesophageal reflux disease)   . Hyperlipidemia   . Hypertension   . Non-STEMI (non-ST elevated myocardial infarction) (HCC)    NUCLEAR STRESS TEST, 11/04/2010 - normal, no ischemia  . Osteopenia   . Pain in limb    LEA VENOUS DUPLEX, 06/27/2010 - no evidence of DVT  . Stroke ([place])   . Thoracic aortic aneurysm (HCC)    status post resection and grafting  . Thyroid disease    hypothyroid    Past Surgical History:  Procedure Laterality Date  . CARDIAC CATHETERIZATION  06/18/2010   Recommended CABG  . CARDIOVERSION N/A 09/30/2015   Procedure: CARDIOVERSION;  Surgeon: [name], MD;  Location: [facility] ENDOSCOPY;  Service: Cardiovascular;   Laterality: N/A;  . CARDIOVERSION N/A 06/05/2016   Procedure: CARDIOVERSION;  Surgeon: [name], MD;  Location: [facility] ENDOSCOPY;  Service: Cardiovascular;  Laterality: N/A;  . CHOLECYSTECTOMY    . CORONARY ARTERY BYPASS GRAFT  06/18/2010   LIMA-LAD, vein-diagonal branch, vein-obtuse marginal branch, and resectioning and grafting of throacic aortic aneurysm  . ELBOW SURGERY  1979   fracture left  . THORACIC AORTIC ANEURYSM REPAIR    . TONSILLECTOMY  1945  . TUBAL LIGATION  1971     reports that she has never smoked. She has never used smokeless tobacco. She reports that she does not drink alcohol or use drugs.  Allergies  Allergen Reactions  . Contrast Media [Iodinated Diagnostic Agents] Anaphylaxis and Swelling  . Iodine Anaphylaxis and Swelling  . Shellfish Allergy Anaphylaxis    unknown  . Gabapentin Hives    Family History  Problem Relation Age of Onset  . Hypertension Mother   . Osteoporosis Mother   . Heart disease Father   . Stroke Father   . Alzheimer's disease Father   . Heart attack Father     Prior to Admission medications   Medication Sig Start Date End Date Taking? Authorizing Provider  albuterol (PROVENTIL) (2.5 MG/3ML) 0.083% nebulizer solution Take 2.5 mg by nebulization every 2 (two) hours as needed for wheezing or shortness of breath.  05/24/16  Yes [provider]  amLODipine (NORVASC) 2.5 MG tablet TAKE 1 TABLET(2.5 MG) BY MOUTH DAILY Patient taking differently: Take 2.5 mg by mouth daily.  09/03/17  Yes [name], MD  aspirin EC 81 MG tablet Take 1 tablet (81 mg total) by mouth daily. 08/07/15  Yes [name], MD  atorvastatin (LIPITOR) 40 MG tablet Take 1 tablet (40 mg total) by mouth daily at 6 PM. 08/18/18  Yes Kilroy, Lurena Joiner K, PA-C  budesonide (PULMICORT) 0.25 MG/2ML nebulizer solution Take 0.25 mg by nebulization daily as needed (SOB).    Yes [provider]  donepezil (ARICEPT) 10 MG tablet TAKE 1 TABLET(10 MG) BY MOUTH  AT BEDTIME Patient taking differently: Take 10 mg by mouth every evening.  08/09/17  Yes [name], MD  empagliflozin (JARDIANCE) 25 MG TABS tablet Take 12.5 mg by mouth daily. 01/10/18  Yes [name], MD  escitalopram (LEXAPRO) 10 MG tablet TAKE 1 TABLET(10 MG) BY MOUTH DAILY Patient taking differently: Take 10 mg by mouth daily.  09/03/17  Yes [name], MD  furosemide (LASIX) 40 MG tablet TAKE 1 TABLET BY MOUTH DAILY AS NEEDED FOR FLUID RETENTION Patient taking differently: Take 40 mg by mouth daily as needed for fluid.  02/08/17  Yes Pickard, Cammie Mcgee, MD  JANUVIA 100 MG tablet TAKE 1 TABLET BY MOUTH DAILY Patient taking differently: Take 100 mg by mouth daily.  07/11/18  Yes [name], MD  loratadine (CLARITIN) 10 MG tablet Take 10 mg by mouth daily.    Yes [provider]  losartan (COZAAR) 50 MG tablet TAKE 1 TABLET BY MOUTH DAILY(DISCONTINUE VALSARTAN) Patient taking differently: Take 50 mg by mouth daily.  03/25/18  Yes [name], MD  memantine (NAMENDA) 10 MG tablet TAKE 1 TABLET(10 MG) BY MOUTH TWICE DAILY Patient taking differently: Take 10 mg by mouth 2 (two) times daily.  05/27/18  Yes [name], MD  metoCLOPramide (REGLAN) 5 MG tablet TAKE 1 TABLET(5 MG) BY MOUTH FOUR TIMES DAILY BEFORE MEALS AND AT BEDTIME Patient taking differently: Take 5 mg by mouth 3 (three) times daily.  08/17/18  Yes [name], MD  metoprolol succinate (TOPROL-XL) 50 MG 24 hr tablet Take 1 tablet (50 mg total) by mouth daily. Patient taking differently: Take 25 mg by mouth daily.  11/05/17  Yes [name], MD  nitroGLYCERIN (NITROSTAT) 0.4 MG SL tablet Place 1 tablet (0.4 mg total) under the tongue every 5 (five) minutes as needed for chest pain (MAX 3 TABLETS). 10/26/17  Yes [name], MD  SYNTHROID 112 MCG tablet TAKE 1/2 TABLET BY MOUTH DAILY BEFORE BREAKFAST Patient taking differently: Take 56 mcg by mouth daily before breakfast.   07/20/18  Yes Pickard, Cammie Mcgee, MD  XARELTO 20 MG TABS tablet TAKE 1 TABLET(20 MG) BY MOUTH DAILY WITH SUPPER Patient taking differently: Take 20 mg by mouth daily.  05/12/18  Yes [name], MD    Physical Exam: Vitals:   08/19/18 0430 08/19/18 0445 08/19/18 0500 08/19/18 0530  BP: (!) 159/103 (!) 159/80 (!) 164/78 (!) 159/85  Pulse: 86 88 80 80  Resp: (!) 25 (!) 26 16 (!) 26  Temp:      TempSrc:      SpO2: (!) 88% (!) 88% 96% 95%  Weight:      Height:          Constitutional: NAD, calm, comfortable Vitals:   08/19/18 0430 08/19/18 0445 08/19/18 0500 08/19/18 0530  BP: (!) 159/103 (!) 159/80 (!) 164/78 (!) 159/85  Pulse: 86 88 80 80  Resp: (!) 25 (!) 26 16 (!) 26  Temp:      TempSrc:      SpO2: (!) 88% (!) 88% 96% 95%  Weight:      Height:       Eyes: PERRL, lids and conjunctivae normal ENMT: Mucous membranes are moist. Posterior pharynx clear of any exudate or lesions.Normal dentition.  Neck: normal, supple, no masses, no thyromegaly Respiratory: clear to auscultation bilaterally, no wheezing, no crackles. Normal respiratory effort. No accessory muscle use.  Cardiovascular: Regular rate and rhythm, no murmurs / rubs / gallops. No extremity edema. 2+ pedal pulses. No carotid bruits.  Abdomen: no tenderness, no masses palpated. No hepatosplenomegaly. Bowel sounds positive.  Musculoskeletal: no clubbing / cyanosis. No joint deformity upper and lower extremities. Good ROM, no contractures. Normal muscle tone.  Skin: no rashes, lesions, ulcers. No induration Neurologic: CN 2-12 grossly intact. Sensation intact, DTR normal. Strength 5/5 in all 4.  Psychiatric: Normal judgment and insight. Alert and oriented x 3. Normal mood.    Labs on Admission: I have personally reviewed following labs and imaging studies  CBC: Recent Labs  Lab 08/15/18 1744 08/19/18 0304  WBC 9.1 19.4*  NEUTROABS 5.2 16.6*  HGB 13.7 14.5  HCT 39.6 42.9  MCV 94.1 96.2  PLT 210 270    Basic Metabolic Panel: Recent Labs  Lab 08/15/18 1744 08/18/18 1254 08/19/18 0304  NA 132* 138 137  K 2.9* 4.0 3.9  CL 100 95* 102  CO2 21* 26 24  GLUCOSE 193* 312* 357*  BUN 13 17 16   CREATININE 0.80 1.00 1.05*  CALCIUM 8.5* 9.1 9.2  MG  --  1.6  --    GFR: Estimated Creatinine Clearance: 50.3 mL/min (A) (by C-G formula based on SCr of 1.05 mg/dL (H)). Liver Function Tests: Recent Labs  Lab 08/15/18 1744 08/19/18 0304  AST 17 19  ALT 13 14  ALKPHOS 62 67  BILITOT 0.9 0.8  PROT 6.9 7.4  ALBUMIN 3.1* 3.5   No results for input(s): LIPASE, AMYLASE in the last 168 hours. No results for input(s): AMMONIA in the last 168 hours. Coagulation Profile: No results for input(s): INR, PROTIME in the last 168 hours. Cardiac Enzymes: No results for input(s): CKTOTAL, CKMB, CKMBINDEX, TROPONINI in the last 168 hours. BNP (last 3 results) No results for input(s): PROBNP in the last 8760 hours. HbA1C: No results for input(s): HGBA1C in the last 72 hours. CBG: Recent Labs  Lab 08/15/18 2200  GLUCAP 276*   Lipid Profile: No results for input(s): CHOL, HDL, LDLCALC, TRIG, CHOLHDL, LDLDIRECT in the last 72 hours. Thyroid Function Tests: Recent Labs    08/18/18 1254  TSH 1.620   Anemia Panel: No results for input(s): VITAMINB12, FOLATE, FERRITIN, TIBC, IRON, RETICCTPCT in the last 72 hours. Urine analysis:    Component Value Date/Time   COLORURINE YELLOW 08/15/2018 2011   APPEARANCEUR CLEAR 08/15/2018 2011   LABSPEC 1.015 08/15/2018 2011   PHURINE 8.0 08/15/2018 2011   GLUCOSEU 50 (A) 08/15/2018 2011   HGBUR NEGATIVE 08/15/2018 2011   BILIRUBINUR NEGATIVE 08/15/2018 2011   KETONESUR 5 (A) 08/15/2018 2011   PROTEINUR >=300 (A) 08/15/2018 2011   UROBILINOGEN 0.2 09/04/2010 1241   NITRITE NEGATIVE 08/15/2018 2011   LEUKOCYTESUR NEGATIVE 08/15/2018 2011   Sepsis Labs: !!!!!!!!!!!!!!!!!!!!!!!!!!!!!!!!!!!!!!!!!!!! @LABRCNTIP (procalcitonin:4,lacticidven:4) ) Recent  Results (from the past 240 hour(s))  Urine culture  Status: None   Collection Time: 08/15/18  8:11 PM  Result Value Ref Range Status   Specimen Description URINE, CLEAN CATCH  Final   Special Requests NONE  Final   Culture   Final    NO GROWTH Performed at [facility], [address], [place], [place] [address]    Report Status 08/17/2018 FINAL  Final     Radiological Exams on Admission: Dg Chest 2 View  Result Date: 08/19/2018 CLINICAL DATA:  Fever EXAM: CHEST - 2 VIEW COMPARISON:  08/15/2018 FINDINGS: Chronic cardiomegaly and vascular pedicle widening. Prior CABG. Stable interstitial coarsening when compared to recent prior and 2017. There is no edema, consolidation, effusion, or pneumothorax. No acute osseous finding. IMPRESSION: Stable compared to priors.  No acute finding. Electronically Signed   By: Monte Fantasia M.D.   On: 08/19/2018 04:45   Old chart reviewed Case discussed with dr Roxanne Mins   Assessment/Plan 83 year old female with fever and Sirs of unclear source Principal Problem:   Fever-check influenza panel.  Urinalysis pending.  Chest x-ray is nonrevealing.  Empirically cover with vancomycin and cefepime.  Blood cultures obtained.  Blood pressure stable, white count 19 and lactic acid mildly elevated at 2.7.  IV fluids and serial lactic acid till normalizes.  Active Problems:   Essential hypertension-stable clarify and resume home meds    Hx of CABG-stable    PAF (paroxysmal atrial fibrillation) (HCC)-currently rate controlled    Dementia (HCC)-seems very mild and stable    Weakness-due to infection    Chronic diastolic CHF (congestive heart failure) (HCC)-stable and compensated this time holding diuretics   Med rec pending pharmacy review  DVT prophylaxis: scds Code Status: full Family Communication: daughter, and son Disposition Plan:  24 hours Consults called: none Admission status: observation   Shawnna Pancake A MD [facility]  If  7PM-7AM, please contact night-coverage [email or web address] Password [facility]  08/19/2018, 5:45 AM

## 2018-08-19 NOTE — ED Triage Notes (Signed)
BIB EMS from home. Family reporting intermittent fevers, nausea today. Max fever of 101F reported at home. Unsure of treatment by family. Seen here on Monday for weakness. V/S WNL. CBG 394. Denies SOB, CP. A&O x4.

## 2018-08-19 NOTE — Progress Notes (Signed)
Pharmacy Antibiotic Note  Idalys Konecny England is a 83 y.o. female admitted on 08/19/2018 with fevers/weakness, possible sepsis.  Pharmacy has been consulted for Vancomycin and Cefepime dosing.  Vancomycin 1500 mg IV given in ED at 0530  Plan: Vancomycin 1250 mg IV q24h Cefepime 1 g IV q12h  Height: 5\' 9"  (175.3 cm) Weight: 191 lb 12.8 oz (87 kg) IBW/kg (Calculated) : 66.2  Temp (24hrs), Avg:100.2 F (37.9 C), Min:99.6 F (37.6 C), Max:100.8 F (38.2 C)  Recent Labs  Lab 08/15/18 1744 08/18/18 1254 08/19/18 0304 08/19/18 0308  WBC 9.1  --  19.4*  --   CREATININE 0.80 1.00 1.05*  --   LATICACIDVEN  --   --   --  2.72*    Estimated Creatinine Clearance: 50.3 mL/min (A) (by C-G formula based on SCr of 1.05 mg/dL (H)).    Allergies  Allergen Reactions  . Contrast Media [Iodinated Diagnostic Agents] Anaphylaxis and Swelling  . Iodine Anaphylaxis and Swelling  . Shellfish Allergy Anaphylaxis    unknown  . Gabapentin Hives    [name] Pina 08/19/2018 8:12 AM

## 2018-08-19 NOTE — ED Notes (Signed)
Per family now at bedside, pt has received nothing from them for fevers. Only reglan for nausea.

## 2018-08-19 NOTE — Telephone Encounter (Signed)
Received fmla for this patients caretaker karen highfill  Will route these forms to sandy

## 2018-08-19 NOTE — ED Provider Notes (Signed)
[facility] EMERGENCY DEPARTMENT Provider Note   CSN: [id number] Arrival date & time: 08/19/18  0250     History   Chief Complaint Chief Complaint  Patient presents with  . Fever  . Nausea    HPI Lindsay Nguyen is a 83 y.o. female.  The history is provided by the patient. The history is limited by the condition of the patient (Dementia).  She has history of hypertension, diabetes, hyperlipidemia, atrial fibrillation, anticoagulated on apixaban, stroke, thoracic aortic aneurysm and is brought in by EMS because of fevers at home.  Apparently, she has been having intermittent fevers for the last 3 days with highest temperature recorded at home 101.1.  Patient was not aware of fevers or chills, but has had some sweats.  She denies rhinorrhea, sore throat, cough.  There has been some nausea without vomiting.  She denies diarrhea.  She denies urinary urgency, frequency, tenesmus, dysuria.  She has been having difficulty with weakness for approximately the last week and was seen in the ED because of that.  She states that she has received the influenza immunization this season.  Past Medical History:  Diagnosis Date  . Anemia   . Atrial fibrillation ([place])    persistent; on Eliquis  . CAD (coronary artery disease)    2D ECHO, 11/04/2010 - EF >55%, mild-moderate mitral regurgitation, moderate tricuspid regurgitation  . Dementia ([place])   . Diabetes mellitus   . GERD (gastroesophageal reflux disease)   . Hyperlipidemia   . Hypertension   . Non-STEMI (non-ST elevated myocardial infarction) (HCC)    NUCLEAR STRESS TEST, 11/04/2010 - normal, no ischemia  . Osteopenia   . Pain in limb    LEA VENOUS DUPLEX, 06/27/2010 - no evidence of DVT  . Stroke ([place])   . Thoracic aortic aneurysm (HCC)    status post resection and grafting  . Thyroid disease    hypothyroid    Patient Active Problem List   Diagnosis Date Noted  . Weakness 08/18/2018  . Medication noncompliance due to  cognitive impairment   . TIA (transient ischemic attack) 09/29/2017  . Dementia ([place]) 09/29/2017  . History of depression 11/17/2016  . Bradycardia, sinus 10/22/2015  . Acute diastolic (congestive) heart failure ([place]) 10/22/2015  . Chronic anticoagulation 10/22/2015  . 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 PAF (paroxysmal atrial fibrillation) ([place]) 08/07/2015  . Metabolic disorder, iron 70/26/3785  . PVD (peripheral vascular disease)-  01/20/2013  . Bronchitis 01/20/2013  . Bronchopneumonia 04/04/2011  . Myalgia 10/26/2010  . PLEURAL EFFUSION, RIGHT 08/20/2010  . Hx of CABG 05/01/2010  . Hypothyroidism 08/20/2009  . Controlled diabetes mellitus type II without complication ([place]) 88/50/2774  . HLD (hyperlipidemia) 08/20/2009  . Essential hypertension 08/20/2009  . GERD 08/20/2009  . OSTEOPENIA 08/20/2009    Past Surgical History:  Procedure Laterality Date  . CARDIAC CATHETERIZATION  06/18/2010   Recommended CABG  . CARDIOVERSION N/A 09/30/2015   Procedure: CARDIOVERSION;  Surgeon: [name], MD;  Location: [facility] ENDOSCOPY;  Service: Cardiovascular;  Laterality: N/A;  . CARDIOVERSION N/A 06/05/2016   Procedure: CARDIOVERSION;  Surgeon: [name], MD;  Location: [facility] ENDOSCOPY;  Service: Cardiovascular;  Laterality: N/A;  . CHOLECYSTECTOMY    . CORONARY ARTERY BYPASS GRAFT  06/18/2010   LIMA-LAD, vein-diagonal branch, vein-obtuse marginal branch, and resectioning and grafting of throacic aortic aneurysm  . ELBOW SURGERY  1979   fracture left  . THORACIC AORTIC ANEURYSM REPAIR    . TONSILLECTOMY  1945  .  TUBAL LIGATION  1971     OB History   No obstetric history on file.      Home Medications    Prior to Admission medications   Medication Sig Start Date End Date Taking? Authorizing Provider  albuterol (PROVENTIL) (2.5 MG/3ML) 0.083% nebulizer solution INHALE 1 NEBULE VIA NEBULIZER Q 2 HOURS PRN FOR WHEEZING OR SHORTNESS OF BREATH 05/24/16   [provider]    amLODipine (NORVASC) 2.5 MG tablet TAKE 1 TABLET(2.5 MG) BY MOUTH DAILY 09/03/17   [name], MD  aspirin EC 81 MG tablet Take 1 tablet (81 mg total) by mouth daily. 08/07/15   [name], MD  atorvastatin (LIPITOR) 40 MG tablet Take 1 tablet (40 mg total) by mouth daily at 6 PM. 08/18/18   Kilroy, Lurena Joiner K, PA-C  budesonide (PULMICORT) 0.25 MG/2ML nebulizer solution Take 0.25 mg by nebulization as directed.     [provider]  donepezil (ARICEPT) 10 MG tablet TAKE 1 TABLET(10 MG) BY MOUTH AT BEDTIME 08/09/17   [name], MD  empagliflozin (JARDIANCE) 25 MG TABS tablet Take 12.5 mg by mouth daily. 01/10/18   [name], MD  escitalopram (LEXAPRO) 10 MG tablet TAKE 1 TABLET(10 MG) BY MOUTH DAILY 09/03/17   [name], MD  furosemide (LASIX) 40 MG tablet TAKE 1 TABLET BY MOUTH DAILY AS NEEDED FOR FLUID RETENTION 02/08/17   [name], MD  JANUVIA 100 MG tablet TAKE 1 TABLET BY MOUTH DAILY 07/11/18   [name], MD  loratadine (CLARITIN) 10 MG tablet Take 10 mg by mouth daily as needed for allergies.    [provider]  losartan (COZAAR) 50 MG tablet TAKE 1 TABLET BY MOUTH DAILY(DISCONTINUE VALSARTAN) 03/25/18   [name], MD  memantine (NAMENDA) 10 MG tablet TAKE 1 TABLET(10 MG) BY MOUTH TWICE DAILY 05/27/18   [name], MD  metoCLOPramide (REGLAN) 5 MG tablet TAKE 1 TABLET(5 MG) BY MOUTH FOUR TIMES DAILY BEFORE MEALS AND AT BEDTIME 08/17/18   [name], MD  metoprolol succinate (TOPROL-XL) 50 MG 24 hr tablet Take 1 tablet (50 mg total) by mouth daily. 11/05/17   [name], MD  nitroGLYCERIN (NITROSTAT) 0.4 MG SL tablet Place 1 tablet (0.4 mg total) under the tongue every 5 (five) minutes as needed for chest pain (MAX 3 TABLETS). 10/26/17   [name], MD  SYNTHROID 112 MCG tablet TAKE 1/2 TABLET BY MOUTH DAILY BEFORE BREAKFAST 07/20/18   [name], MD  XARELTO 20 MG TABS tablet TAKE 1 TABLET(20 MG) BY  MOUTH DAILY WITH SUPPER 05/12/18   [name], MD    Family History Family History  Problem Relation Age of Onset  . Hypertension Mother   . Osteoporosis Mother   . Heart disease Father   . Stroke Father   . Alzheimer's disease Father   . Heart attack Father     Social History Social History   Tobacco Use  . Smoking status: Never Smoker  . Smokeless tobacco: Never Used  Substance Use Topics  . Alcohol use: No  . Drug use: No     Allergies   Contrast media [iodinated diagnostic agents]; Iodine; Shellfish allergy; and Gabapentin   Review of Systems Review of Systems  Unable to perform ROS: Dementia     Physical Exam Updated Vital Signs BP (!) 179/73 (BP Location: Right Arm)   Pulse 76   Temp 99.6 F (37.6 C) (Oral)  Resp 17   Ht 5\' 9"  (1.753 m)   Wt 87 kg   SpO2 93%   BMI 28.32 kg/m   Physical Exam Vitals signs and nursing note reviewed.    83 year old female, resting comfortably and in no acute distress. Vital signs are significant for elevated blood pressure. Oxygen saturation is 93%, which is normal. Head is normocephalic and atraumatic. PERRLA, EOMI. Oropharynx is clear. Neck is nontender and supple without adenopathy or JVD. Back is nontender and there is no CVA tenderness. Lungs are clear without rales, wheezes, or rhonchi. Chest is nontender. Heart has regular rate and rhythm with 2/6 holosystolic murmur heard in the upper sternal area. Abdomen is soft, flat, nontender without masses or hepatosplenomegaly and peristalsis is normoactive. Extremities have no cyanosis or edema, full range of motion is present. Skin is warm and dry without rash. Neurologic: Mental status is normal, cranial nerves are intact, there are no motor or sensory deficits.  ED Treatments / Results  Labs (all labs ordered are listed, but only abnormal results are displayed) Labs Reviewed  COMPREHENSIVE METABOLIC PANEL - Abnormal; Notable for the following  components:      Result Value   Glucose, Bld 357 (*)    Creatinine, Ser 1.05 (*)    GFR calc non Af Amer 50 (*)    GFR calc Af Amer 58 (*)    All other components within normal limits  CBC WITH DIFFERENTIAL/PLATELET - Abnormal; Notable for the following components:   WBC 19.4 (*)    Neutro Abs 16.6 (*)    Monocytes Absolute 1.8 (*)    Abs Immature Granulocytes 0.09 (*)    All other components within normal limits  I-STAT CG4 LACTIC ACID, ED - Abnormal; Notable for the following components:   Lactic Acid, Venous 2.72 (*)    All other components within normal limits  CULTURE, BLOOD (ROUTINE X 2)  CULTURE, BLOOD (ROUTINE X 2)  URINALYSIS, ROUTINE W REFLEX MICROSCOPIC  INFLUENZA PANEL BY PCR (TYPE A & B)  I-STAT TROPONIN, ED  I-STAT CG4 LACTIC ACID, ED   Radiology Dg Chest 2 View  Result Date: 08/19/2018 CLINICAL DATA:  Fever EXAM: CHEST - 2 VIEW COMPARISON:  08/15/2018 FINDINGS: Chronic cardiomegaly and vascular pedicle widening. Prior CABG. Stable interstitial coarsening when compared to recent prior and 2017. There is no edema, consolidation, effusion, or pneumothorax. No acute osseous finding. IMPRESSION: Stable compared to priors.  No acute finding. Electronically Signed   By: Monte Fantasia M.D.   On: 08/19/2018 04:45    Procedures Procedures  CRITICAL CARE Performed by: Delora Fuel Total critical care time: 45 minutes Critical care time was exclusive of separately billable procedures and treating other patients. Critical care was necessary to treat or prevent imminent or life-threatening deterioration. Critical care was time spent personally by me on the following activities: development of treatment plan with patient and/or surrogate as well as nursing, discussions with consultants, evaluation of patient's response to treatment, examination of patient, obtaining history from patient or surrogate, ordering and performing treatments and interventions, ordering and review of  laboratory studies, ordering and review of radiographic studies, pulse oximetry and re-evaluation of patient's condition.  Medications Ordered in ED Medications  sodium chloride 0.9 % bolus 1,000 mL (1,000 mLs Intravenous New Bag/Given 08/19/18 0449)  ceFEPIme (MAXIPIME) 2 g in sodium chloride 0.9 % 100 mL IVPB (2 g Intravenous New Bag/Given 08/19/18 0452)  metroNIDAZOLE (FLAGYL) IVPB 500 mg (has no administration in time range)  vancomycin (VANCOCIN) 1,500 mg in sodium chloride 0.9 % 500 mL IVPB (has no administration in time range)     Initial Impression / Assessment and Plan / ED Course  I have reviewed the triage vital signs and the nursing notes.  Pertinent labs & imaging results that were available during my care of the patient were reviewed by me and considered in my medical decision making (see chart for details).  Report of fever at home with weakness that is been ongoing.  Old records are reviewed confirming ED visit on January 13 at which time potassium was 2.9 and urine was clean.  Chest x-ray and CT of head were unremarkable.  We will start septic work-up, the patient does not appear overtly septic.  Will hold off on antibiotics for now.  Lactic acid level has come back slightly elevated, and WBC is significantly elevated.  Chest x-ray shows no evidence of pneumonia.  Urinalysis is still pending.  She is given a 1 L fluid bolus and started on antibiotics for sepsis of undetermined cause.  Case is discussed with Dr. Shanon Brow of [facility] hospital[facility], who agrees to admit the patient.  She requests influenza PCR be obtained.  Final Clinical Impressions(s) / ED Diagnoses   Final diagnoses:  Sepsis due to undetermined organism ([place])  Elevated lactic acid level    ED Discharge Orders    None       Delora Fuel, MD 50/15/86 [id number]

## 2018-08-19 NOTE — ED Notes (Signed)
Pt. Refusing rectal temperature and changing into a gown at this time.

## 2018-08-19 NOTE — ED Notes (Signed)
RN Freida Busman informed of lactic acid results 2.72. Dr Roxanne Mins in Trauma A

## 2018-08-19 NOTE — Progress Notes (Signed)
[facility] PROGRESS NOTE    Progress Note  Lindsay Nguyen  OAC:[id number] DOB: [birth year] DOA: 08/19/2018 PCP: [name], MD     Brief Narrative:   Lindsay Nguyen is an 83 y.o. female past medical history coronary artery disease, diabetes mellitus type 2, atrial fibrillation on Xarelto comes in with fever and nausea that started several days ago before admission, in the ED her temperature was 101.0  Assessment/Plan:   Sepsis of unknown source/fever of unknown source: Chest x-ray is unrevealing.   She was started empirically on IV vancomycin and cefepime. Blood pressure and heart rate has remained stable Lactic acid was 2.7, she was fluid resuscitated she is positive about 2 L. Urine culture is unrevealing, blood cultures are pending. Recheck lactic acid, continue IV fluids, monitor strict I's and O's. She is not hungry, will keep her on a full liquid diet. Get physical therapy to evaluate her. Keep in progressive care.  Essential hypertension Continue metoprolol, hold other antihypertensive medication.  Hx of CABG Continue aspirin and Xarelto.  PAF (paroxysmal atrial fibrillation) (HCC) Rate control, continue Xarelto.  Dementia ([place]) Stable.  Chronic diastolic CHF (congestive heart failure) (HCC) Continue to hold diuretics, continue metoprolol.   DVT prophylaxis: xarelto Family Communication:none Disposition Plan/Barrier to D/C: keep in SDU Code Status:     Code Status Orders  (From admission, onward)         Start     Ordered   08/19/18 0523  Full code  Continuous     08/19/18 0523        Code Status History    Date Active Date Inactive Code Status Order ID Comments User Context   09/29/2017 1806 10/01/2017 2118 Full Code [id number]  Karmen Bongo, MD Inpatient    Advance Directive Documentation     Most Recent Value  Type of Advance Directive  Out of facility DNR (pink MOST or yellow form)  Pre-existing out of facility DNR order (yellow  form or pink MOST form)  -  "MOST" Form in Place?  -        IV Access:    Peripheral IV   Procedures and diagnostic studies:   Dg Chest 2 View  Result Date: 08/19/2018 CLINICAL DATA:  Fever EXAM: CHEST - 2 VIEW COMPARISON:  08/15/2018 FINDINGS: Chronic cardiomegaly and vascular pedicle widening. Prior CABG. Stable interstitial coarsening when compared to recent prior and 2017. There is no edema, consolidation, effusion, or pneumothorax. No acute osseous finding. IMPRESSION: Stable compared to priors.  No acute finding. Electronically Signed   By: Monte Fantasia M.D.   On: 08/19/2018 04:45     Medical Consultants:    None.  Anti-Infectives:   IV vancomycin and cefepime.  Subjective:    [place] she relates she still feels weak tired, she denies any cough shortness of breath, cuts or sores, no recent procedures or dental work.  Objective:    Vitals:   08/19/18 0445 08/19/18 0500 08/19/18 0530 08/19/18 0545  BP: (!) 159/80 (!) 164/78 (!) 159/85   Pulse: 88 80 80 79  Resp: (!) 26 16 (!) 26 (!) 23  Temp:      TempSrc:      SpO2: (!) 88% 96% 95% 100%  Weight:      Height:        Intake/Output Summary (Last 24 hours) at 08/19/2018 0733 Last data filed at 08/19/2018 0552 Gross per 24 hour  Intake 2100 ml  Output 0 ml  Net  2100 ml   Filed Weights   08/19/18 0256  Weight: 87 kg    Exam: General exam: In no acute distress. Respiratory system: Good air movement and clear to auscultation. Cardiovascular system: S1 & S2 heard, RRR. No JVD. Gastrointestinal system: Abdomen is nondistended, soft and nontender.  Central nervous system: Alert and oriented. No focal neurological deficits. Extremities: No pedal edema. Skin: No rashes, lesions or ulcers Psychiatry: Judgement and insight appear normal. Mood & affect appropriate.    Data Reviewed:    Labs: Basic Metabolic Panel: Recent Labs  Lab 08/15/18 1744 08/18/18 1254 08/19/18 0304  NA 132* 138  137  K 2.9* 4.0 3.9  CL 100 95* 102  CO2 21* 26 24  GLUCOSE 193* 312* 357*  BUN 13 17 16   CREATININE 0.80 1.00 1.05*  CALCIUM 8.5* 9.1 9.2  MG  --  1.6  --    GFR Estimated Creatinine Clearance: 50.3 mL/min (A) (by C-G formula based on SCr of 1.05 mg/dL (H)). Liver Function Tests: Recent Labs  Lab 08/15/18 1744 08/19/18 0304  AST 17 19  ALT 13 14  ALKPHOS 62 67  BILITOT 0.9 0.8  PROT 6.9 7.4  ALBUMIN 3.1* 3.5   No results for input(s): LIPASE, AMYLASE in the last 168 hours. No results for input(s): AMMONIA in the last 168 hours. Coagulation profile No results for input(s): INR, PROTIME in the last 168 hours.  CBC: Recent Labs  Lab 08/15/18 1744 08/19/18 0304  WBC 9.1 19.4*  NEUTROABS 5.2 16.6*  HGB 13.7 14.5  HCT 39.6 42.9  MCV 94.1 96.2  PLT 210 193   Cardiac Enzymes: No results for input(s): CKTOTAL, CKMB, CKMBINDEX, TROPONINI in the last 168 hours. BNP (last 3 results) No results for input(s): PROBNP in the last 8760 hours. CBG: Recent Labs  Lab 08/15/18 2200  GLUCAP 276*   D-Dimer: No results for input(s): DDIMER in the last 72 hours. Hgb A1c: No results for input(s): HGBA1C in the last 72 hours. Lipid Profile: No results for input(s): CHOL, HDL, LDLCALC, TRIG, CHOLHDL, LDLDIRECT in the last 72 hours. Thyroid function studies: Recent Labs    08/18/18 1254  TSH 1.620   Anemia work up: No results for input(s): VITAMINB12, FOLATE, FERRITIN, TIBC, IRON, RETICCTPCT in the last 72 hours. Sepsis Labs: Recent Labs  Lab 08/15/18 1744 08/19/18 0304 08/19/18 0308  WBC 9.1 19.4*  --   LATICACIDVEN  --   --  2.72*   Microbiology Recent Results (from the past 240 hour(s))  Urine culture     Status: None   Collection Time: 08/15/18  8:11 PM  Result Value Ref Range Status   Specimen Description URINE, CLEAN CATCH  Final   Special Requests NONE  Final   Culture   Final    NO GROWTH Performed at [facility], [address], [place],   [place] [address]    Report Status 08/17/2018 FINAL  Final     Medications:   . aspirin EC  81 mg Oral Daily  . atorvastatin  40 mg Oral q1800  . insulin aspart  0-15 Units Subcutaneous TID WC  . insulin aspart  0-5 Units Subcutaneous QHS  . insulin detemir  10 Units Subcutaneous BID  . loratadine  10 mg Oral Daily  . metoprolol succinate  25 mg Oral Daily  . rivaroxaban  20 mg Oral QAC supper   Continuous Infusions: . sodium chloride 100 mL/hr at 08/19/18 0555     LOS: 0 days  Charlynne Cousins  [facility]   *Please refer to [email or web address], password [email or web address] to get updated schedule on who will round on this patient, as hospitalists switch teams weekly. If 7PM-7AM, please contact night-coverage at [email or web address], password [email or web address] for any overnight needs.  08/19/2018, 7:33 AM

## 2018-08-20 LAB — CBC
HCT: 36.9 % (ref 36.0–46.0)
Hemoglobin: 12.6 g/dL (ref 12.0–15.0)
MCH: 33.5 pg (ref 26.0–34.0)
MCHC: 34.1 g/dL (ref 30.0–36.0)
MCV: 98.1 fL (ref 80.0–100.0)
PLATELETS: 147 10*3/uL — AB (ref 150–400)
RBC: 3.76 MIL/uL — ABNORMAL LOW (ref 3.87–5.11)
RDW: 13 % (ref 11.5–15.5)
WBC: 16.3 10*3/uL — AB (ref 4.0–10.5)
nRBC: 0 % (ref 0.0–0.2)

## 2018-08-20 LAB — RESPIRATORY PANEL BY PCR
ADENOVIRUS-RVPPCR: NOT DETECTED
Bordetella pertussis: NOT DETECTED
CORONAVIRUS NL63-RVPPCR: NOT DETECTED
Chlamydophila pneumoniae: NOT DETECTED
Coronavirus 229E: NOT DETECTED
Coronavirus HKU1: NOT DETECTED
Coronavirus OC43: NOT DETECTED
Influenza A: NOT DETECTED
Influenza B: NOT DETECTED
Metapneumovirus: NOT DETECTED
Mycoplasma pneumoniae: NOT DETECTED
PARAINFLUENZA VIRUS 3-RVPPCR: NOT DETECTED
PARAINFLUENZA VIRUS 4-RVPPCR: NOT DETECTED
Parainfluenza Virus 1: NOT DETECTED
Parainfluenza Virus 2: NOT DETECTED
Respiratory Syncytial Virus: NOT DETECTED
Rhinovirus / Enterovirus: NOT DETECTED

## 2018-08-20 LAB — GLUCOSE, CAPILLARY
Glucose-Capillary: 209 mg/dL — ABNORMAL HIGH (ref 70–99)
Glucose-Capillary: 180 mg/dL — ABNORMAL HIGH (ref 70–99)
Glucose-Capillary: 154 mg/dL — ABNORMAL HIGH (ref 70–99)
Glucose-Capillary: 139 mg/dL — ABNORMAL HIGH (ref 70–99)

## 2018-08-20 LAB — BASIC METABOLIC PANEL
Anion gap: 10 (ref 5–15)
BUN: 13 mg/dL (ref 8–23)
CO2: 21 mmol/L — ABNORMAL LOW (ref 22–32)
Calcium: 8.3 mg/dL — ABNORMAL LOW (ref 8.9–10.3)
Chloride: 106 mmol/L (ref 98–111)
Creatinine, Ser: 0.86 mg/dL (ref 0.44–1.00)
GFR calc non Af Amer: >60 mL/min (ref >60)
Glucose, Bld: 186 mg/dL — ABNORMAL HIGH (ref 70–99)
Potassium: 3.4 mmol/L — ABNORMAL LOW (ref 3.5–5.1)
Sodium: 137 mmol/L (ref 135–145)

## 2018-08-20 MED ORDER — HYDRALAZINE HCL 20 MG/ML IJ SOLN
5.0000 mg | Freq: Four times a day (QID) | INTRAMUSCULAR | Status: DC | PRN
  Administered 2018-08-25: 5 mg via INTRAVENOUS
  Filled 2018-08-20 (×2): qty 1

## 2018-08-20 MED ORDER — POTASSIUM CHLORIDE CRYS ER 20 MEQ PO TBCR
40.0000 meq | EXTENDED_RELEASE_TABLET | Freq: Once | ORAL | Status: AC
  Administered 2018-08-20: 40 meq via ORAL
  Filled 2018-08-20: qty 2

## 2018-08-20 MED ORDER — SODIUM CHLORIDE 0.9 % IV SOLN
INTRAVENOUS | Status: DC
  Administered 2018-08-20 – 2018-08-21 (×3): via INTRAVENOUS

## 2018-08-20 NOTE — Progress Notes (Addendum)
Paged [facility] for in/out. Bladder scanned 350ml. Patient feels urgency. Bladder palpable. Patient says that she does not want a catheter.  In/Out ordered.  Attempted in/out x3 without success.  Giving patient time to rest at this time. Will continue to observe. Patient denies discomfort at this time.

## 2018-08-20 NOTE — Evaluation (Addendum)
Occupational Therapy Evaluation Patient Details Name: Lindsay Nguyen MRN: [id number] DOB: 1936-03-03 Today's Date: 08/20/2018    History of Present Illness Patient is 83 y/o female admitted to hospital 1/17 with symptoms of fever, nausea, and weakness secondary to sepsis of unknown source. Further workup is pending. PMH includes HTN, hx of CABG, PAF, TAA, CVA, dCHF, and dementia.    Clinical Impression   Pt with decline in function and safety with ADLs and ADL mobility with decreased strength, balance and endurance.Pt with hx of dementia and live at home with her family. PTA pt was using RW and required min A with selfcare/ADLs. Pt now requires extensive assist with ADLs and ADL mobility. Pt would benefit from acute OT services to address impairments to maximize level of function and safety    Follow Up Recommendations  SNF;Supervision/Assistance - 24 hour    Equipment Recommendations  Other (comment)(TBD at next venue of care)    Recommendations for Other Services       Precautions / Restrictions Precautions Precautions: Fall Restrictions Weight Bearing Restrictions: No      Mobility Bed Mobility Overal bed mobility: Needs Assistance Bed Mobility: Sit to Supine       Sit to supine: Mod assist   General bed mobility comments: pt up in [facility] upon arrival. Max A with LEs back onto bed  Transfers Overall transfer level: Needs assistance   Transfers: Sit to/from Stand Sit to Stand: Min assist;+2 physical assistance              Balance Overall balance assessment: Needs assistance Sitting-balance support: Feet supported Sitting balance-Leahy Scale: Fair Sitting balance - Comments: pt sat EOB for UB dressing/grooming tasks. Pt able to scoot to [facility] while seated using rail   Standing balance support: Bilateral upper extremity supported;During functional activity Standing balance-Leahy Scale: Poor                             ADL either performed or  assessed with clinical judgement   ADL Overall ADL's : Needs assistance/impaired Eating/Feeding: Set up;Sitting   Grooming: Wash/dry hands;Wash/dry face;Min guard;Sitting   Upper Body Bathing: Min guard;Sitting   Lower Body Bathing: Maximal assistance   Upper Body Dressing : Min guard;Sitting   Lower Body Dressing: Total assistance   Toilet Transfer: Minimal assistance;+2 for physical assistance;BSC Toilet Transfer Details (indicate cue type and reason): used Doctor, hospital and Hygiene: Total assistance;Sit to/from stand       Functional mobility during ADLs: Minimal assistance;+2 for physical assistance;Cueing for safety General ADL Comments: pt sat EOB for UB dressing/grooming tasks. Pt able to scoot to [facility] while seated using rail     Vision Baseline Vision/History: Wears glasses Wears Glasses: At all times Patient Visual Report: No change from baseline       Perception     Praxis      Pertinent Vitals/Pain Pain Assessment: No/denies pain Faces Pain Scale: No hurt     Hand Dominance Right   Extremity/Trunk Assessment Upper Extremity Assessment Upper Extremity Assessment: Generalized weakness   Lower Extremity Assessment Lower Extremity Assessment: Defer to PT evaluation   Cervical / Trunk Assessment Cervical / Trunk Assessment: Normal   Communication Communication Communication: No difficulties   Cognition Arousal/Alertness: Awake/alert Behavior During Therapy: WFL for tasks assessed/performed Overall Cognitive Status: History of cognitive impairments - at baseline  General Comments: dementia at baseline    General Comments       Exercises     Shoulder Instructions      Home Living Family/patient expects to be discharged to:: Private residence Living Arrangements: Children Available Help at Discharge: Family;Available 24 hours/day Type of Home: House Home Access: Stairs to  enter [employer] of Steps: 5 Entrance Stairs-Rails: Right;Left Home Layout: One level     Bathroom Shower/Tub: Occupational psychologist: Handicapped height     Home Equipment: Tub bench;Walker - 2 wheels          Prior Functioning/Environment Level of Independence: Needs assistance  Gait / Transfers Assistance Needed: pt using RW, up intil approximately 2 days ago and now requires max - total A from her children for transfers ADL's / Homemaking Assistance Needed: daughter assisted with some ADLs including bathing.            OT Problem List: Decreased strength;Decreased activity tolerance;Decreased cognition;Decreased knowledge of use of DME or AE;Decreased knowledge of precautions;Decreased safety awareness;Impaired balance (sitting and/or standing)      OT Treatment/Interventions: Self-care/ADL training;DME and/or AE instruction;Therapeutic activities;Patient/family education    OT Goals(Current goals can be found in the care plan section) Acute Rehab OT Goals Patient Stated Goal: go home OT Goal Formulation: With patient Time For Goal Achievement: 09/03/18 Potential to Achieve Goals: Good ADL Goals Pt Will Perform Grooming: with supervision;with set-up;sitting Pt Will Perform Upper Body Bathing: with supervision;with set-up;sitting Pt Will Perform Lower Body Bathing: with mod assist;sitting/lateral leans Pt Will Perform Upper Body Dressing: with supervision;with set-up;sitting Pt Will Transfer to Toilet: with min assist;bedside commode Pt Will Perform Toileting - Clothing Manipulation and hygiene: with max assist;with mod assist;sit to/from stand;sitting/lateral leans  OT Frequency: Min 2X/week   Barriers to D/C: Decreased caregiver support          Co-evaluation              AM-PAC OT "6 Clicks" Daily Activity     Outcome Measure Help from another person eating meals?: None Help from another person taking care of personal grooming?:  A Little Help from another person toileting, which includes using toliet, bedpan, or urinal?: Total Help from another person bathing (including washing, rinsing, drying)?: A Lot Help from another person to put on and taking off regular upper body clothing?: A Little Help from another person to put on and taking off regular lower body clothing?: Total 6 Click Score: 14   End of Session Equipment Utilized During Treatment: Other (comment)(BSC, [place])  Activity Tolerance: Patient tolerated treatment well Patient left: in bed;with call bell/phone within reach;with bed alarm set  OT Visit Diagnosis: Unsteadiness on feet (R26.81);Other abnormalities of gait and mobility (R26.89);Muscle weakness (generalized) (M62.81);Other symptoms and signs involving cognitive function                Time: 2633-3545 OT Time Calculation (min): 27 min Charges:  OT General Charges $OT Visit: 1 Visit OT Evaluation $OT Eval Moderate Complexity: 1 Mod OT Treatments $Self Care/Home Management : 8-22 mins    Britt Bottom 08/20/2018, 12:29 PM

## 2018-08-20 NOTE — Progress Notes (Signed)
[facility] PROGRESS NOTE    Progress Note  [place] Ellis  NWG:[id number] DOB: 06/29/1936 DOA: 08/19/2018 PCP: [name], MD     Brief Narrative:   Lindsay Nguyen is an 83 y.o. female past medical history coronary artery disease, diabetes mellitus type 2, atrial fibrillation on Xarelto comes in with fever and nausea that started several days ago before admission, in the ED her temperature was 101.0  Assessment/Plan:   SIRS/fever of unknown source: Chest x-ray is unrevealing.   She was started empirically on IV vancomycin and cefepime. Blood pressure and heart rate has remained stable. Procalcitonin is less than 0.10 check a respiratory panel.  Her leukocytosis is improved and she has remained afebrile. She is positive about 4.5 L Urinalysis is unrevealing, blood cultures negative till date I's and O's are poorly recorded. Tolerating full liquid diet. Therapy evaluated the patient recommended skilled nursing facility the patient has refused skilled nursing facility.  Essential hypertension Continue metoprolol, hold other antihypertensive medication.  Hx of CABG Continue aspirin and Xarelto.  PAF (paroxysmal atrial fibrillation) (HCC) Rate control, continue Xarelto.  Dementia ([place]) Stable.  Chronic diastolic CHF (congestive heart failure) (HCC) Continue to hold diuretics, continue metoprolol.  New hypokalemia: Replete orally recheck in the morning.   DVT prophylaxis: xarelto Family Communication:none Disposition Plan/Barrier to D/C: keep in SDU Code Status:     Code Status Orders  (From admission, onward)         Start     Ordered   08/19/18 0523  Full code  Continuous     08/19/18 0523        Code Status History    Date Active Date Inactive Code Status Order ID Comments User Context   09/29/2017 1806 10/01/2017 2118 Full Code [id number]  Karmen Bongo, MD Inpatient    Advance Directive Documentation     Most Recent Value  Type of Advance  Directive  Out of facility DNR (pink MOST or yellow form)  Pre-existing out of facility DNR order (yellow form or pink MOST form)  -  "MOST" Form in Place?  -        IV Access:    Peripheral IV   Procedures and diagnostic studies:   Dg Chest 2 View  Result Date: 08/19/2018 CLINICAL DATA:  Fever EXAM: CHEST - 2 VIEW COMPARISON:  08/15/2018 FINDINGS: Chronic cardiomegaly and vascular pedicle widening. Prior CABG. Stable interstitial coarsening when compared to recent prior and 2017. There is no edema, consolidation, effusion, or pneumothorax. No acute osseous finding. IMPRESSION: Stable compared to priors.  No acute finding. Electronically Signed   By: Monte Fantasia M.D.   On: 08/19/2018 04:45     Medical Consultants:    None.  Anti-Infectives:   IV vancomycin and cefepime.  Subjective:    [name] patient feels tired still anorexic not short of breath denies any chest pain.  Objective:    Vitals:   08/19/18 1520 08/19/18 2015 08/20/18 0000 08/20/18 0311  BP: (!) 130/55 (!) 114/50 (!) 146/73 (!) 168/75  Pulse: 66  76 80  Resp: 10  (!) 26 17  Temp: 98 F (36.7 C) 98.2 F (36.8 C) 98.7 F (37.1 C) 98.2 F (36.8 C)  TempSrc: Oral Oral Oral Oral  SpO2: 93%  90% 92%  Weight:      Height:        Intake/Output Summary (Last 24 hours) at 08/20/2018 5284 Last data filed at 08/20/2018 0311 Gross per 24 hour  Intake 2429.78 ml  Output 0 ml  Net 2429.78 ml   Filed Weights   08/19/18 0256  Weight: 87 kg    Exam: General exam: In no acute distress. Respiratory system: Good air movement and clear to auscultation. Cardiovascular system: Rate and rhythm with positive S1-S2 no murmurs rubs gallops. Gastrointestinal system: Positive bowel sounds soft nontender nondistended Central nervous system: Alert and oriented. No focal neurological deficits. Extremities: No pedal edema. Skin: No rashes, lesions or ulcers Psychiatry: Judgement and insight appear  normal. Mood & affect appropriate.    Data Reviewed:    Labs: Basic Metabolic Panel: Recent Labs  Lab 08/15/18 1744 08/18/18 1254 08/19/18 0304 08/20/18 0241  NA 132* 138 137 137  K 2.9* 4.0 3.9 3.4*  CL 100 95* 102 106  CO2 21* 26 24 21*  GLUCOSE 193* 312* 357* 186*  BUN 13 17 16 13   CREATININE 0.80 1.00 1.05* 0.86  CALCIUM 8.5* 9.1 9.2 8.3*  MG  --  1.6  --   --    GFR Estimated Creatinine Clearance: 61.4 mL/min (by C-G formula based on SCr of 0.86 mg/dL). Liver Function Tests: Recent Labs  Lab 08/15/18 1744 08/19/18 0304  AST 17 19  ALT 13 14  ALKPHOS 62 67  BILITOT 0.9 0.8  PROT 6.9 7.4  ALBUMIN 3.1* 3.5   No results for input(s): LIPASE, AMYLASE in the last 168 hours. No results for input(s): AMMONIA in the last 168 hours. Coagulation profile No results for input(s): INR, PROTIME in the last 168 hours.  CBC: Recent Labs  Lab 08/15/18 1744 08/19/18 0304 08/20/18 0241  WBC 9.1 19.4* 16.3*  NEUTROABS 5.2 16.6*  --   HGB 13.7 14.5 12.6  HCT 39.6 42.9 36.9  MCV 94.1 96.2 98.1  PLT 210 193 147*   Cardiac Enzymes: No results for input(s): CKTOTAL, CKMB, CKMBINDEX, TROPONINI in the last 168 hours. BNP (last 3 results) No results for input(s): PROBNP in the last 8760 hours. CBG: Recent Labs  Lab 08/15/18 2200 08/19/18 0815 08/19/18 1136 08/19/18 1656 08/19/18 2116  GLUCAP 276* 308* 336* 172* 203*   D-Dimer: No results for input(s): DDIMER in the last 72 hours. Hgb A1c: Recent Labs    08/19/18 0722  HGBA1C 8.9*   Lipid Profile: No results for input(s): CHOL, HDL, LDLCALC, TRIG, CHOLHDL, LDLDIRECT in the last 72 hours. Thyroid function studies: Recent Labs    08/18/18 1254  TSH 1.620   Anemia work up: No results for input(s): VITAMINB12, FOLATE, FERRITIN, TIBC, IRON, RETICCTPCT in the last 72 hours. Sepsis Labs: Recent Labs  Lab 08/15/18 1744 08/19/18 0304 08/19/18 0308 08/19/18 0716 08/19/18 0801 08/19/18 1018 08/20/18 0241    PROCALCITON  --   --   --   --  <0.10  --   --   WBC 9.1 19.4*  --   --   --   --  16.3*  LATICACIDVEN  --   --  2.72* 2.1*  --  2.2*  --    Microbiology Recent Results (from the past 240 hour(s))  Urine culture     Status: None   Collection Time: 08/15/18  8:11 PM  Result Value Ref Range Status   Specimen Description URINE, CLEAN CATCH  Final   Special Requests NONE  Final   Culture   Final    NO GROWTH Performed at [facility], [place] [address], [place], [place] [address]    Report Status 08/17/2018 FINAL  Final  MRSA  PCR Screening     Status: None   Collection Time: 08/19/18  6:27 AM  Result Value Ref Range Status   MRSA by PCR NEGATIVE NEGATIVE Final    Comment:        The GeneXpert MRSA Assay (FDA approved for NASAL specimens only), is one component of a comprehensive MRSA colonization surveillance program. It is not intended to diagnose MRSA infection nor to guide or monitor treatment for MRSA infections. Performed at [facility], [place] [address], [place], [place] [address]      Medications:   . aspirin EC  81 mg Oral Daily  . atorvastatin  40 mg Oral q1800  . insulin aspart  0-15 Units Subcutaneous TID WC  . insulin aspart  0-5 Units Subcutaneous QHS  . insulin detemir  10 Units Subcutaneous BID  . loratadine  10 mg Oral Daily  . metoprolol succinate  25 mg Oral Daily  . rivaroxaban  20 mg Oral QAC supper   Continuous Infusions: . sodium chloride 100 mL/hr at 08/20/18 0416  . ceFEPime (MAXIPIME) IV 1 g (08/19/18 2239)  . vancomycin 1,250 mg (08/20/18 0526)     LOS: 1 day   Charlynne Cousins  [facility]   *Please refer to [email or web address], password [email or web address] to get updated schedule on who will round on this patient, as hospitalists switch teams weekly. If 7PM-7AM, please contact night-coverage at [email or web address], password [email or web address] for any overnight needs.  08/20/2018, 7:22 AM

## 2018-08-21 ENCOUNTER — Encounter: Payer: Self-pay | Admitting: Family Medicine

## 2018-08-21 DIAGNOSIS — I480 Paroxysmal atrial fibrillation: Secondary | ICD-10-CM

## 2018-08-21 DIAGNOSIS — F0390 Unspecified dementia without behavioral disturbance: Secondary | ICD-10-CM

## 2018-08-21 LAB — BASIC METABOLIC PANEL
Anion gap: 10 (ref 5–15)
BUN: 9 mg/dL (ref 8–23)
CALCIUM: 7.9 mg/dL — AB (ref 8.9–10.3)
CO2: 20 mmol/L — ABNORMAL LOW (ref 22–32)
Chloride: 104 mmol/L (ref 98–111)
Creatinine, Ser: 0.71 mg/dL (ref 0.44–1.00)
GFR calc Af Amer: >60 mL/min (ref >60)
GFR calc non Af Amer: >60 mL/min (ref >60)
Glucose, Bld: 209 mg/dL — ABNORMAL HIGH (ref 70–99)
Potassium: 3.5 mmol/L (ref 3.5–5.1)
Sodium: 134 mmol/L — ABNORMAL LOW (ref 135–145)

## 2018-08-21 LAB — GLUCOSE, CAPILLARY
Glucose-Capillary: 178 mg/dL — ABNORMAL HIGH (ref 70–99)
Glucose-Capillary: 178 mg/dL — ABNORMAL HIGH (ref 70–99)
Glucose-Capillary: 209 mg/dL — ABNORMAL HIGH (ref 70–99)
Glucose-Capillary: 165 mg/dL — ABNORMAL HIGH (ref 70–99)

## 2018-08-21 MED ORDER — LEVOTHYROXINE SODIUM 112 MCG PO TABS
56.0000 ug | ORAL_TABLET | Freq: Every day | ORAL | Status: DC
  Administered 2018-08-21 – 2018-08-26 (×6): 56 ug via ORAL
  Filled 2018-08-21 (×6): qty 1

## 2018-08-21 MED ORDER — FUROSEMIDE 40 MG PO TABS
40.0000 mg | ORAL_TABLET | Freq: Every day | ORAL | Status: DC
  Administered 2018-08-21 – 2018-08-23 (×3): 40 mg via ORAL
  Filled 2018-08-21 (×4): qty 1

## 2018-08-21 MED ORDER — AMLODIPINE BESYLATE 2.5 MG PO TABS
2.5000 mg | ORAL_TABLET | Freq: Every day | ORAL | Status: DC
  Administered 2018-08-21 – 2018-08-26 (×6): 2.5 mg via ORAL
  Filled 2018-08-21 (×7): qty 1

## 2018-08-21 MED ORDER — LOSARTAN POTASSIUM 50 MG PO TABS
50.0000 mg | ORAL_TABLET | Freq: Every day | ORAL | Status: DC
  Administered 2018-08-21 – 2018-08-26 (×6): 50 mg via ORAL
  Filled 2018-08-21 (×7): qty 1

## 2018-08-21 MED ORDER — DONEPEZIL HCL 10 MG PO TABS
10.0000 mg | ORAL_TABLET | Freq: Every evening | ORAL | Status: DC
  Administered 2018-08-21 – 2018-08-25 (×4): 10 mg via ORAL
  Filled 2018-08-21 (×3): qty 1

## 2018-08-21 NOTE — Plan of Care (Signed)

## 2018-08-21 NOTE — Progress Notes (Signed)
[facility] PROGRESS NOTE    Progress Note  Lindsay Nguyen  IHK:[id number] DOB: September 16, 1935 DOA: 08/19/2018 PCP: Lindsay Frizzle, MD     Brief Narrative:   Lindsay Nguyen is an 83 y.o. female past medical history coronary artery disease, diabetes mellitus type 2, atrial fibrillation on Xarelto comes in with fever and nausea that started several days ago before admission, in the ED her temperature was 101.0  Assessment/Plan:   SIRS/fever of unknown source: She has significant crackles on physical exam. She was started empirically on IV vancomycin and cefepime. Blood pressure and heart rate has remained stable. Procalcitonin is less than 0.10. Respiratory panel is negative. She is positive about 4.5 L has picked up. Urinalysis is unrevealing, blood cultures negative till date I's and O's are poorly recorded. Tolerating her regular diet. Patient will therapy and physical therapy evaluated the patient and recommended skilled nursing facility the patient has now agreed.  Essential hypertension Continue metoprolol, resume her Lasix, losartan and Norvasc.  Hx of CABG Continue aspirin and Xarelto.  PAF (paroxysmal atrial fibrillation) (HCC) Rate control, continue Xarelto.  Dementia ([place]) Stable.  Chronic diastolic CHF (congestive heart failure) (HCC) Resume Lasix Cozaar continue metoprolol.  New hypokalemia: Replete orally recheck in the morning.   DVT prophylaxis: xarelto Family Communication:none Disposition Plan/Barrier to D/C: keep in SDU Code Status:     Code Status Orders  (From admission, onward)         Start     Ordered   08/19/18 0523  Full code  Continuous     08/19/18 0523        Code Status History    Date Active Date Inactive Code Status Order ID Comments User Context   09/29/2017 1806 10/01/2017 2118 Full Code [id number]  Karmen Bongo, MD Inpatient    Advance Directive Documentation     Most Recent Value  Type of Advance Directive  Out  of facility DNR (pink MOST or yellow form)  Pre-existing out of facility DNR order (yellow form or pink MOST form)  -  "MOST" Form in Place?  -        IV Access:    Peripheral IV   Procedures and diagnostic studies:   No results found.   Medical Consultants:    None.  Anti-Infectives:   IV vancomycin and cefepime.  Subjective:    Lindsay Nguyen patient relates she still anorexic still winded especially when going from the bed to the chair.  Objective:    Vitals:   08/20/18 2300 08/20/18 2359 08/21/18 0300 08/21/18 0428  BP:  (!) 181/84  (!) 183/87  Pulse: 85 80 87 84  Resp: (!) 28 (!) 30 (!) 27 (!) 31  Temp:  (!) 97.4 F (36.3 C)  98.3 F (36.8 C)  TempSrc:  Oral  Oral  SpO2: 90% 90% 91% 92%  Weight:      Height:        Intake/Output Summary (Last 24 hours) at 08/21/2018 0728 Last data filed at 08/21/2018 0429 Gross per 24 hour  Intake 2432.68 ml  Output 2125 ml  Net 307.68 ml   Filed Weights   08/19/18 0256  Weight: 87 kg    Exam: General exam: In no acute distress. Respiratory system: Good air movement with crackles at bases. Cardiovascular system: Rate and rhythm with positive S1-S2 no murmurs rubs gallops. Gastrointestinal system: Positive bowel sounds soft nontender nondistended Central nervous system: Alert and oriented. No focal neurological deficits. Extremities: No pedal  edema. Skin: No rashes, lesions or ulcers Psychiatry: Judgement and insight appear normal. Mood & affect appropriate.    Data Reviewed:    Labs: Basic Metabolic Panel: Recent Labs  Lab 08/15/18 1744 08/18/18 1254 08/19/18 0304 08/20/18 0241  NA 132* 138 137 137  K 2.9* 4.0 3.9 3.4*  CL 100 95* 102 106  CO2 21* 26 24 21*  GLUCOSE 193* 312* 357* 186*  BUN 13 17 16 13   CREATININE 0.80 1.00 1.05* 0.86  CALCIUM 8.5* 9.1 9.2 8.3*  MG  --  1.6  --   --    GFR Estimated Creatinine Clearance: 61.4 mL/min (by C-G formula based on SCr of 0.86 mg/dL). Liver  Function Tests: Recent Labs  Lab 08/15/18 1744 08/19/18 0304  AST 17 19  ALT 13 14  ALKPHOS 62 67  BILITOT 0.9 0.8  PROT 6.9 7.4  ALBUMIN 3.1* 3.5   No results for input(s): LIPASE, AMYLASE in the last 168 hours. No results for input(s): AMMONIA in the last 168 hours. Coagulation profile No results for input(s): INR, PROTIME in the last 168 hours.  CBC: Recent Labs  Lab 08/15/18 1744 08/19/18 0304 08/20/18 0241  WBC 9.1 19.4* 16.3*  NEUTROABS 5.2 16.6*  --   HGB 13.7 14.5 12.6  HCT 39.6 42.9 36.9  MCV 94.1 96.2 98.1  PLT 210 193 147*   Cardiac Enzymes: No results for input(s): CKTOTAL, CKMB, CKMBINDEX, TROPONINI in the last 168 hours. BNP (last 3 results) No results for input(s): PROBNP in the last 8760 hours. CBG: Recent Labs  Lab 08/19/18 2116 08/20/18 0809 08/20/18 1145 08/20/18 1644 08/20/18 2113  GLUCAP 203* 209* 180* 154* 139*   D-Dimer: No results for input(s): DDIMER in the last 72 hours. Hgb A1c: Recent Labs    08/19/18 0722  HGBA1C 8.9*   Lipid Profile: No results for input(s): CHOL, HDL, LDLCALC, TRIG, CHOLHDL, LDLDIRECT in the last 72 hours. Thyroid function studies: Recent Labs    08/18/18 1254  TSH 1.620   Anemia work up: No results for input(s): VITAMINB12, FOLATE, FERRITIN, TIBC, IRON, RETICCTPCT in the last 72 hours. Sepsis Labs: Recent Labs  Lab 08/15/18 1744 08/19/18 0304 08/19/18 0308 08/19/18 0716 08/19/18 0801 08/19/18 1018 08/20/18 0241  PROCALCITON  --   --   --   --  <0.10  --   --   WBC 9.1 19.4*  --   --   --   --  16.3*  LATICACIDVEN  --   --  2.72* 2.1*  --  2.2*  --    Microbiology Recent Results (from the past 240 hour(s))  Urine culture     Status: None   Collection Time: 08/15/18  8:11 PM  Result Value Ref Range Status   Specimen Description URINE, CLEAN CATCH  Final   Special Requests NONE  Final   Culture   Final    NO GROWTH Performed at [facility], [place] [address] [place], [place]  [address]    Report Status 08/17/2018 FINAL  Final  Blood Culture (routine x 2)     Status: None (Preliminary result)   Collection Time: 08/19/18  3:04 AM  Result Value Ref Range Status   Specimen Description BLOOD RIGHT ANTECUBITAL  Final   Special Requests   Final    BOTTLES DRAWN AEROBIC AND ANAEROBIC Blood Culture results may not be optimal due to an inadequate volume of blood received in culture bottles   Culture   Final  NO GROWTH 1 DAY Performed at [facility], [place] [address], [place], [place] [address]    Report Status PENDING  Incomplete  Blood Culture (routine x 2)     Status: None (Preliminary result)   Collection Time: 08/19/18  3:40 AM  Result Value Ref Range Status   Specimen Description BLOOD RIGHT FOREARM  Final   Special Requests   Final    BOTTLES DRAWN AEROBIC AND ANAEROBIC Blood Culture results may not be optimal due to an excessive volume of blood received in culture bottles   Culture   Final    NO GROWTH 1 DAY Performed at [facility], [place] [address] [place], [place] [address]    Report Status PENDING  Incomplete  MRSA PCR Screening     Status: None   Collection Time: 08/19/18  6:27 AM  Result Value Ref Range Status   MRSA by PCR NEGATIVE NEGATIVE Final    Comment:        The GeneXpert MRSA Assay (FDA approved for NASAL specimens only), is one component of a comprehensive MRSA colonization surveillance program. It is not intended to diagnose MRSA infection nor to guide or monitor treatment for MRSA infections. Performed at [facility], [place] [address] [place], [place] [address]   Respiratory Panel by PCR     Status: None   Collection Time: 08/20/18  7:25 AM  Result Value Ref Range Status   Adenovirus NOT DETECTED NOT DETECTED Final   Coronavirus 229E NOT DETECTED NOT DETECTED Final   Coronavirus HKU1 NOT DETECTED NOT DETECTED Final   Coronavirus NL63 NOT DETECTED NOT DETECTED Final   Coronavirus OC43 NOT DETECTED NOT DETECTED  Final   Metapneumovirus NOT DETECTED NOT DETECTED Final   Rhinovirus / Enterovirus NOT DETECTED NOT DETECTED Final   Influenza A NOT DETECTED NOT DETECTED Final   Influenza B NOT DETECTED NOT DETECTED Final   Parainfluenza Virus 1 NOT DETECTED NOT DETECTED Final   Parainfluenza Virus 2 NOT DETECTED NOT DETECTED Final   Parainfluenza Virus 3 NOT DETECTED NOT DETECTED Final   Parainfluenza Virus 4 NOT DETECTED NOT DETECTED Final   Respiratory Syncytial Virus NOT DETECTED NOT DETECTED Final   Bordetella pertussis NOT DETECTED NOT DETECTED Final   Chlamydophila pneumoniae NOT DETECTED NOT DETECTED Final   Mycoplasma pneumoniae NOT DETECTED NOT DETECTED Final    Comment: Performed at [facility], [place] [address], [place], [place] [address]     Medications:   . aspirin EC  81 mg Oral Daily  . atorvastatin  40 mg Oral q1800  . insulin aspart  0-15 Units Subcutaneous TID WC  . insulin aspart  0-5 Units Subcutaneous QHS  . insulin detemir  10 Units Subcutaneous BID  . loratadine  10 mg Oral Daily  . metoprolol succinate  25 mg Oral Daily  . rivaroxaban  20 mg Oral QAC supper   Continuous Infusions: . ceFEPime (MAXIPIME) IV Stopped (08/20/18 2023)  . vancomycin 1,250 mg (08/21/18 0556)     LOS: 2 days   Charlynne Cousins  [facility]   *Please refer to [email or web address], password [email or web address] to get updated schedule on who will round on this patient, as hospitalists switch teams weekly. If 7PM-7AM, please contact night-coverage at [email or web address], password [email or web address] for any overnight needs.  08/21/2018, 7:28 AM

## 2018-08-22 ENCOUNTER — Telehealth: Payer: Self-pay | Admitting: *Deleted

## 2018-08-22 DIAGNOSIS — R531 Weakness: Secondary | ICD-10-CM

## 2018-08-22 DIAGNOSIS — E876 Hypokalemia: Secondary | ICD-10-CM

## 2018-08-22 LAB — GLUCOSE, CAPILLARY
Glucose-Capillary: 128 mg/dL — ABNORMAL HIGH (ref 70–99)
Glucose-Capillary: 142 mg/dL — ABNORMAL HIGH (ref 70–99)
Glucose-Capillary: 166 mg/dL — ABNORMAL HIGH (ref 70–99)
Glucose-Capillary: 217 mg/dL — ABNORMAL HIGH (ref 70–99)

## 2018-08-22 LAB — BASIC METABOLIC PANEL
Anion gap: 10 (ref 5–15)
BUN: 11 mg/dL (ref 8–23)
CALCIUM: 8.1 mg/dL — AB (ref 8.9–10.3)
CO2: 23 mmol/L (ref 22–32)
CREATININE: 0.74 mg/dL (ref 0.44–1.00)
Chloride: 104 mmol/L (ref 98–111)
GFR calc Af Amer: >60 mL/min (ref >60)
Glucose, Bld: 161 mg/dL — ABNORMAL HIGH (ref 70–99)
Potassium: 3 mmol/L — ABNORMAL LOW (ref 3.5–5.1)
Sodium: 137 mmol/L (ref 135–145)

## 2018-08-22 MED ORDER — POTASSIUM CHLORIDE CRYS ER 20 MEQ PO TBCR
40.0000 meq | EXTENDED_RELEASE_TABLET | ORAL | Status: AC
  Administered 2018-08-22 (×2): 40 meq via ORAL
  Filled 2018-08-22 (×2): qty 2

## 2018-08-22 MED ORDER — METOCLOPRAMIDE HCL 10 MG PO TABS
5.0000 mg | ORAL_TABLET | Freq: Three times a day (TID) | ORAL | Status: DC
  Administered 2018-08-22 – 2018-08-26 (×13): 5 mg via ORAL
  Filled 2018-08-22 (×13): qty 1

## 2018-08-22 NOTE — Telephone Encounter (Signed)
-----   Message from Erlene Quan, [place] sent at 08/21/2018 11:05 AM EST ----- Please let the patient know her labs looked OK- he K+ is good, TSH normal, and her Mg++ is borderline low. She should take Mag Ox 400 mg BID for two days the 400 mg daily.  I'll see her back as scheduled.  [name] PA-C 08/21/2018 11:05 AM

## 2018-08-22 NOTE — Progress Notes (Signed)
With poor vein access, md aware , changed iv antibiotic to po and okayed not to have iv line .

## 2018-08-22 NOTE — Telephone Encounter (Signed)
Spoke with pt dtr, patient is currently admitted.

## 2018-08-22 NOTE — Clinical Social Work Note (Signed)
Clinical Social Work Assessment  Patient Details  Name: Lindsay Nguyen MRN: [id number] Date of Birth: 25-May-1936  Date of referral:  08/22/18               Reason for consult:  Facility Placement                Permission sought to share information with:  Facility Sport and exercise psychologist, Family Supports Permission granted to share information::  Yes, Verbal Permission Granted  Name::     Damaris Hippo and Charisse Klinefelter  Agency::  [facility]NF[facility]  Relationship::  daughter and son  Contact Information:  Santiago Glad- [phone] work number [phone] - [name] [phone]  Housing/Transportation Living arrangements for the past 2 months:  [place] of Information:  Patient, Adult Children Patient Interpreter Needed:  None Criminal Activity/Legal Involvement Pertinent to Current Situation/Hospitalization:  No - Comment as needed Significant Relationships:  Adult Children Lives with:  Adult Children Do you feel safe going back to the place where you live?  No Need for family participation in patient care:  Yes (Comment)  Care giving concerns:  CSW received consult for discharge needs. CSW spoke with patient and her son at bedside regarding PT recommendation of SNF placement at time of discharge. Her daughter participated in the discussion by phone.  Patient son states he lives in a single story home with his mother. He is there to assist the patient but expressed he would like for her to go to  Rehab and get stronger before returning home.  Patient's daughter inquired about medication assistance. CSW will follow up with the [facility] about concern.  Social Worker assessment / plan: CSW spoke with patient concerning possibility of rehab at [facility] before returning home. Patient and family agree with the recommendation.     Employment status:  Retired Forensic scientist:  Medicare PT Recommendations:  [place] / Referral to community resources:  [place]  Patient/Family's Response to care:  Patient recognizes need for rehab before returning home and is agreeable to a SNF placement. CSW provided the family with SNF listing and  Placed copy in the patient's chart. They prefer SNF in [facility] and inform the CSW of their choices.   Patient/Family's Understanding of and Emotional Response to Diagnosis, Current Treatment, and Prognosis:  *Patient and family is realistic regarding therapy needs and expressed being hopeful for SNF placement. Patient expressed understanding of CSW role and discharge process as well as medical condition. No questions/concerns about plan or treatment at this time.   Emotional Assessment Appearance:  Appears stated age Attitude/Demeanor/Rapport:  Engaged Affect (typically observed):  Accepting, Appropriate Orientation:  Oriented to Self, Oriented to Place, Oriented to  Time, Oriented to Situation Alcohol / Substance use:  Not Applicable Psych involvement (Current and /or in the community):  No (Comment)  Discharge Needs  Concerns to be addressed:  Care Coordination Readmission within the last 30 days:  No Current discharge risk:  Dependent with Mobility Barriers to Discharge:  Continued Medical Work up   [employer], [place] 08/22/2018, 5:32 PM

## 2018-08-22 NOTE — Progress Notes (Addendum)
PROGRESS NOTE    Lindsay Nguyen  YFV:[id number] DOB: Feb 14, 1936 DOA: 08/19/2018 PCP: [name], MD    Brief Narrative:  83 year old female who presented with weakness, she does have significant past medical history for coronary artery disease, type 2 diabetes mellitus and atrial fibrillation.  Patient was brought to the hospital due to significant weakness, and fever, associated with poor oral intake for about 24 hours.  On her initial physical examination blood pressure 159/80, heart rate 88, respiratory rate 26, oxygen saturation 88% to 96%, her lungs were clear to auscultation bilaterally, heart S1-S2 present and rhythmic, abdomen soft nontender, no lower extremity edema.  She was admitted to the hospital working diagnosis of febrile syndrome to rule out sepsis.   Assessment & Plan:   Principal Problem:   Fever Active Problems:   Essential hypertension   Hx of CABG   PAF (paroxysmal atrial fibrillation) (HCC)   Dementia (HCC)   Weakness   Chronic diastolic CHF (congestive heart failure) ([place])   Sepsis ([place])   1. Febrile syndrome, present on admission. Ruled out sepsis. Patient with no signs of deep infection, cultures have bee no growth and continue to be afebrile. Very weak and deconditioned, possible viral syndrome. Will discontinue antibiotic therapy and will continue close follow up or cell count, cultures and temperature curve.   2. HTN. Continue blood pressure control with metoprolol, losartan, amlodipine and furosemide.  3. Paroxysmal atrial fibrillation. Will continue rate control with metoprolol and anticoagulation with rivaroxaban.   4. Chronic and stable diastolic heart failure. No signs of acute decompensation, will continue blood pressure control.   5. Hypokalemia. Continue K correction with Kcl, follow on renal panel in am. Will give a total of 80 meq today in 2 divided doses.   6. T2DM. Will continue glucose cover and monitoring with insulin sliding  scale. Continue basal insulin with glargine 10 units bid.   7. Hypothyroid. Continue levothyroxine per home regimen.   DVT prophylaxis: rivaroxaban   Code Status: dnr  Family Communication: I spoke with patient's family at the bedside and all questions were addressed. CODE status was discussed with patient and her son at the bedside.    Disposition Plan/ discharge barriers: pending placement SNF   Body mass index is 28.32 kg/m. Malnutrition Type:      Malnutrition Characteristics:      Nutrition Interventions:     RN Pressure Injury Documentation:     Consultants:     Procedures:     Antimicrobials:       Subjective: Patient continue to feel very weak and deconditioned, intermittent nausea but no vomiting, no chest pain or dyspnea. Has lost IV access and is a difficult access.   Objective: Vitals:   08/22/18 0158 08/22/18 0248 08/22/18 0728 08/22/18 1209  BP:  (!) 173/83 (!) 168/89 (!) 171/84  Pulse: 79 78 (!) 59 72  Resp: (!) 28 (!) 24 (!) 23 (!) 22  Temp:  98.1 F (36.7 C) 98.2 F (36.8 C)   TempSrc:  Oral Oral   SpO2: 96% 98% 98% 94%  Weight:      Height:        Intake/Output Summary (Last 24 hours) at 08/22/2018 1343 Last data filed at 08/22/2018 1108 Gross per 24 hour  Intake 100 ml  Output 3000 ml  Net -2900 ml   Filed Weights   08/19/18 0256  Weight: 87 kg    Examination:   General: deconditioned and ill looking appearing Neurology: Awake and  alert, non focal  E ENT: mild pallor, no icterus, oral mucosa moist Cardiovascular: No JVD. S1-S2 present, rhythmic, no gallops, rubs, or murmurs. Trace lower extremity edema. Pulmonary: positive breath sounds bilaterally, adequate air movement, no wheezing, rhonchi or rales. Gastrointestinal. Abdomen with no organomegaly, non tender, no rebound or guarding Skin. No rashes Musculoskeletal: no joint deformities     Data Reviewed: I have personally reviewed following labs and imaging  studies  CBC: Recent Labs  Lab 08/15/18 1744 08/19/18 0304 08/20/18 0241  WBC 9.1 19.4* 16.3*  NEUTROABS 5.2 16.6*  --   HGB 13.7 14.5 12.6  HCT 39.6 42.9 36.9  MCV 94.1 96.2 98.1  PLT 210 193 453*   Basic Metabolic Panel: Recent Labs  Lab 08/18/18 1254 08/19/18 0304 08/20/18 0241 08/21/18 0736 08/22/18 0219  NA 138 137 137 134* 137  K 4.0 3.9 3.4* 3.5 3.0*  CL 95* 102 106 104 104  CO2 26 24 21* 20* 23  GLUCOSE 312* 357* 186* 209* 161*  BUN 17 16 13 9 11   CREATININE 1.00 1.05* 0.86 0.71 0.74  CALCIUM 9.1 9.2 8.3* 7.9* 8.1*  MG 1.6  --   --   --   --    GFR: Estimated Creatinine Clearance: 66 mL/min (by C-G formula based on SCr of 0.74 mg/dL). Liver Function Tests: Recent Labs  Lab 08/15/18 1744 08/19/18 0304  AST 17 19  ALT 13 14  ALKPHOS 62 67  BILITOT 0.9 0.8  PROT 6.9 7.4  ALBUMIN 3.1* 3.5   No results for input(s): LIPASE, AMYLASE in the last 168 hours. No results for input(s): AMMONIA in the last 168 hours. Coagulation Profile: No results for input(s): INR, PROTIME in the last 168 hours. Cardiac Enzymes: No results for input(s): CKTOTAL, CKMB, CKMBINDEX, TROPONINI in the last 168 hours. BNP (last 3 results) No results for input(s): PROBNP in the last 8760 hours. HbA1C: No results for input(s): HGBA1C in the last 72 hours. CBG: Recent Labs  Lab 08/21/18 1142 08/21/18 1643 08/21/18 2148 08/22/18 0730 08/22/18 1205  GLUCAP 178* 209* 165* 128* 142*   Lipid Profile: No results for input(s): CHOL, HDL, LDLCALC, TRIG, CHOLHDL, LDLDIRECT in the last 72 hours. Thyroid Function Tests: No results for input(s): TSH, T4TOTAL, FREET4, T3FREE, THYROIDAB in the last 72 hours. Anemia Panel: No results for input(s): VITAMINB12, FOLATE, FERRITIN, TIBC, IRON, RETICCTPCT in the last 72 hours.    Radiology Studies: I have reviewed all of the imaging during this hospital visit personally     Scheduled Meds: . amLODipine  2.5 mg Oral Daily  . aspirin  EC  81 mg Oral Daily  . atorvastatin  40 mg Oral q1800  . donepezil  10 mg Oral QPM  . furosemide  40 mg Oral Daily  . insulin aspart  0-15 Units Subcutaneous TID WC  . insulin aspart  0-5 Units Subcutaneous QHS  . insulin detemir  10 Units Subcutaneous BID  . levothyroxine  56 mcg Oral QAC breakfast  . loratadine  10 mg Oral Daily  . losartan  50 mg Oral Daily  . metoCLOPramide  5 mg Oral TID AC  . metoprolol succinate  25 mg Oral Daily  . potassium chloride  40 mEq Oral Q4H  . rivaroxaban  20 mg Oral QAC supper   Continuous Infusions:   LOS: 3 days         Gerome Apley, MD [facility] Pager [phone]

## 2018-08-22 NOTE — Progress Notes (Signed)
Physical Therapy Treatment Patient Details Name: Lindsay Nguyen MRN: [id number] DOB: 08/08/1935 Today's Date: 08/22/2018    History of Present Illness Pt is an 83 y.o. female admitted 08/19/18 with fever, nausea and weakness; worked up for [employer] of unknown source. PMH includes HTN, CABG, PAF, CVA, CHF, dementia.   PT Comments    Pt progressing with mobility. Able to stand, transfer and initiate gait training with RW and min-modA; pt easily fatigued with knee instability, requiring assist to prevent LOB. Pt also with bowel and bladder urgency/incontinence limiting ability to mobilize. Continue to recommend SNF-level therapies to maximize functional mobility and independence prior to return home.    Follow Up Recommendations  SNF;Supervision/Assistance - 24 hour     Equipment Recommendations  None recommended by PT    Recommendations for Other Services       Precautions / Restrictions Precautions Precautions: Fall;Other (comment) Precaution Comments: Bladder and bowel urgency/incontinence Restrictions Weight Bearing Restrictions: No    Mobility  Bed Mobility Overal bed mobility: Needs Assistance Bed Mobility: Supine to Sit;Rolling Rolling: Min assist;Mod assist   Supine to sit: Mod assist;HOB elevated     General bed mobility comments: Pt with bowel incontinence while supine; min-modA to roll R/L for pericare and bed pan placement due to needing to void again urgently. ModA for UE support to assist trunk elevation  Transfers Overall transfer level: Needs assistance Equipment used: Rolling walker (2 wheeled) Transfers: Sit to/from Stand Sit to Stand: Min assist         General transfer comment: MinA to stand from bed and again from recliner; heavy use of BUE support to push into standing. Cues for correct hand placement as pt repeatedly attempting to pull on RW  Ambulation/Gait Ambulation/Gait assistance: Min assist;Mod assist Gait Distance (Feet): 2  Feet Assistive device: Rolling walker (2 wheeled) Gait Pattern/deviations: Step-to pattern;Trunk flexed;Leaning posteriorly Gait velocity: Decreased   General Gait Details: Took pivotal steps from bed to recliner and again forwards/backwards at edge of recliner with minA; pt easily fatigued requiring modA to prevent posterior LOB and control descent to chair after both standing gait trials. R knee instability   Stairs             Wheelchair Mobility    Modified Rankin (Stroke Patients Only)       Balance Overall balance assessment: Needs assistance Sitting-balance support: Feet supported Sitting balance-Leahy Scale: Fair     Standing balance support: Bilateral upper extremity supported;During functional activity Standing balance-Leahy Scale: Poor Standing balance comment: Reliant on UE support and external assist to maintain standing balance                            Cognition Arousal/Alertness: Awake/alert Behavior During Therapy: WFL for tasks assessed/performed Overall Cognitive Status: No family/caregiver present to determine baseline cognitive functioning Area of Impairment: Safety/judgement;Awareness;Problem solving;Following commands                       Following Commands: Follows multi-step commands with increased time Safety/Judgement: Decreased awareness of deficits Awareness: Emergent Problem Solving: Slow processing;Requires verbal cues General Comments: Per chart, baseline dementia. A&Ox3. Following commands and interacting appropriately, noted some slower processing/difficulty problem solving but did not test specifically      Exercises      General Comments General comments (skin integrity, edema, etc.): SpO2 90-92% on RA      Pertinent Vitals/Pain Pain Assessment: Faces Faces Pain Scale: Hurts  even more Pain Location: Buttocks when wiped Pain Descriptors / Indicators: Grimacing;Guarding;Moaning Pain Intervention(s):  Monitored during session;Limited activity within patient's tolerance    Home Living                      Prior Function            PT Goals (current goals can now be found in the care plan section) Acute Rehab PT Goals Patient Stated Goal: go home PT Goal Formulation: With patient Time For Goal Achievement: 09/02/18 Potential to Achieve Goals: Fair Progress towards PT goals: Progressing toward goals    Frequency    Min 3X/week      PT Plan Current plan remains appropriate    Co-evaluation              AM-PAC PT "6 Clicks" Mobility   Outcome Measure  Help needed turning from your back to your side while in a flat bed without using bedrails?: A Little Help needed moving from lying on your back to sitting on the side of a flat bed without using bedrails?: A Lot Help needed moving to and from a bed to a chair (including a wheelchair)?: A Little Help needed standing up from a chair using your arms (e.g., wheelchair or bedside chair)?: A Lot Help needed to walk in hospital room?: A Lot Help needed climbing 3-5 steps with a railing? : Total 6 Click Score: 13    End of Session Equipment Utilized During Treatment: Gait belt Activity Tolerance: Patient tolerated treatment well Patient left: in chair;with call bell/phone within reach Nurse Communication: Mobility status PT Visit Diagnosis: Unsteadiness on feet (R26.81);Muscle weakness (generalized) (M62.81);Difficulty in walking, not elsewhere classified (R26.2)     Time: 2637-8588 PT Time Calculation (min) (ACUTE ONLY): 33 min  Charges:  $Gait Training: 8-22 mins $Therapeutic Activity: 8-22 mins                    Mabeline Caras, PT, DPT Acute Rehabilitation Services  Pager [phone] Office [place] 08/22/2018, 2:32 PM

## 2018-08-22 NOTE — Plan of Care (Signed)

## 2018-08-22 NOTE — Care Management Important Message (Signed)
Important Message  Patient Details  Name: Lindsay Nguyen MRN: [id number] Date of Birth: 1936-03-26   Medicare Important Message Given:  Yes    Jeneane Pieczynski P Kengo Sturges 08/22/2018, 2:23 PM

## 2018-08-22 NOTE — Progress Notes (Signed)
PROGRESS NOTE  [place] Parkinson BTD:[id number] DOB: 02-Apr-1936 DOA: 08/19/2018 PCP: [name], MD  HPI/Brief Narrative  Lindsay Nguyen is a 83 y.o. year old female with medical history significant for CAD, Type 2 DM, A fib on Xarelto, HTN, HLD, Stroke, NSTEMI, thoracic aortic aneurysm s/p resection and grafting, Dementia, Gastroparesis who presented on 08/19/2018 with fever, nausea, and generalized weakness and admitted for evaluation of fever. Patient's family states the pt has had physical deconditioning recently and started receiving home physical therapy. Her son and daughter state she was doing well with the exercises last week, but then started experiencing intermittent fevers over 3 days. Her highest fever at home was 101.1. She then developed generalized weakness and nausea and presented to the ED on 08/19/18. She denies any CP or SOB. She denies any vomiting or diarrhea. She denies any dysuria, urgency, or frequency.    Hospital Course: In the ED, the patient was febrile with a white count of 19.4. Her BP was stable at 179/73 and pulse at 76 bpm. Septic work-up was initiated. She was started empirically on IV Vancomycin and Cefepime as well as IV fluids. Lactic acid was elevated at 2.2. CXR showed no evidence of acute cardiopulm disease. UA did not show any evidence of infection. Procalcitonin was < 0.10. Blood cultures were negative. Vitals have remained stable throughout her admission. She has been afebrile. Her white count went down to 16.3 on 08/20/18. She did experience some increased SOB and tachypnea on 1/18, which improved after being on nasal cannula O2 and receiving nebulizer treatment. She now denies SOB and O2 sat is remaining above 90% on RA. Her K is low today at 3, which was replaced orally.   Assessment/Plan:  1. Febrile syndrome with generalized weakness- Patient remains hemodynamically stable and afebrile today. Blood cultures were negative. UA was negative for infection.  CXR didn't show any evidence of PNA or other acute cardiopulm disease. Procalcitonin was < 0.10. Her respiratory panel was negative. She is tolerating PO. Patient was on empiric tx w/ IV Vancomycin and Cefepime. We will d/c empiric abx therapy since there is no clear source of bacterial infection at this time. Suspect patient's sx's were viral in origin. PT and OT evaluated the patient and are recommending SNF for the patient's recovery at this time.   2. Hypokalemia- Patient's K was 3 today. Per pt's daughter, her mag level has been low when measured recently. Will replace with 2 doses of 40 mEq PO KCl. Will recheck Mag level as well as BMP to monitor her K level tomorrow.   3. Gastroparesis- Patient has hx of gastroparesis and typically takes Reglan 5 mg before meals, but has not been taking it since being in the hospital. She is complaining of some intermittent lower abdominal pain over the past couple of days. Will restart the patient on Reglan 5 mg TID.  4. Essential HTN- Stable. BP remains stable today. Will continue home medications of Metoprolol, Lasix, Losartan, and Norvasc.   5. Paroxysmal Atrial Fibrillation- Patient remains in sinus rhythm today with HR in the 70's. Will continue to monitor telemetry. Will continue home Xarelto.  6. Dementia-Stable. Continue patient's home Aricept.  7. Hx of NSTEMI and CABG- Stable. Patient is chest pain free today. Continue home aspirin and Xarelto.    Cultures:  Blood cultures negative   Telemetry: Patient is on telemetry   DVT prophylaxis:  Xarelto  Consultants:   none    Procedures:  none  Antimicrobials:  Code Status: DNR   Family Communication: Patient's son and daughter were in the room and updated on the plan. Patient's son is a Marine scientist and her daughter is an NP.   Disposition Plan: Anticipate discharge to SNF pending continued clinical improvement.   Subjective Patient states overall she is doing well today. She is no  longer experiencing SOB like she was over the weekend. She has been experiencing some intermittent lower abdominal pain over the past couple of days, but states she has not been taking her Reglan she normally does for her Gastroparesis. She is still experiencing some generalized weakness, but states she has been improving since she was first admitted. She denies any nausea, vomiting, fever, chills. She denies any CP.   Objective: Vitals:   08/22/18 0158 08/22/18 0248 08/22/18 0728 08/22/18 1209  BP:  (!) 173/83 (!) 168/89 (!) 171/84  Pulse: 79 78 (!) 59 72  Resp: (!) 28 (!) 24 (!) 23 (!) 22  Temp:  98.1 F (36.7 C) 98.2 F (36.8 C)   TempSrc:  Oral Oral   SpO2: 96% 98% 98% 94%  Weight:      Height:        Intake/Output Summary (Last 24 hours) at 08/22/2018 1244 Last data filed at 08/21/2018 1900 Gross per 24 hour  Intake 100 ml  Output 700 ml  Net -600 ml   Filed Weights   08/19/18 0256  Weight: 87 kg    Exam:  Constitutional:normal appearing female Eyes: EOMI, anicteric, normal conjunctivae ENMT: Oropharynx with moist mucous membranes, normal dentition Neck: FROM Cardiovascular: RRR no MRGs, with no peripheral edema Respiratory: Normal respiratory effort, clear breath sounds  Abdomen: Soft,non-tender, with no HSM Skin: No rash ulcers, or lesions. Without skin tenting  Neurologic: Grossly no focal neuro deficit. Able to move all extremities.  Psychiatric:Appropriate affect, and mood. Mental status AAOx3  Data Reviewed: CBC: Recent Labs  Lab 08/15/18 1744 08/19/18 0304 08/20/18 0241  WBC 9.1 19.4* 16.3*  NEUTROABS 5.2 16.6*  --   HGB 13.7 14.5 12.6  HCT 39.6 42.9 36.9  MCV 94.1 96.2 98.1  PLT 210 193 449*   Basic Metabolic Panel: Recent Labs  Lab 08/18/18 1254 08/19/18 0304 08/20/18 0241 08/21/18 0736 08/22/18 0219  NA 138 137 137 134* 137  K 4.0 3.9 3.4* 3.5 3.0*  CL 95* 102 106 104 104  CO2 26 24 21* 20* 23  GLUCOSE 312* 357* 186* 209* 161*  BUN 17  16 13 9 11   CREATININE 1.00 1.05* 0.86 0.71 0.74  CALCIUM 9.1 9.2 8.3* 7.9* 8.1*  MG 1.6  --   --   --   --    GFR: Estimated Creatinine Clearance: 66 mL/min (by C-G formula based on SCr of 0.74 mg/dL). Liver Function Tests: Recent Labs  Lab 08/15/18 1744 08/19/18 0304  AST 17 19  ALT 13 14  ALKPHOS 62 67  BILITOT 0.9 0.8  PROT 6.9 7.4  ALBUMIN 3.1* 3.5   No results for input(s): LIPASE, AMYLASE in the last 168 hours. No results for input(s): AMMONIA in the last 168 hours. Coagulation Profile: No results for input(s): INR, PROTIME in the last 168 hours. Cardiac Enzymes: No results for input(s): CKTOTAL, CKMB, CKMBINDEX, TROPONINI in the last 168 hours. BNP (last 3 results) No results for input(s): PROBNP in the last 8760 hours. HbA1C: No results for input(s): HGBA1C in the last 72 hours. CBG: Recent Labs  Lab 08/21/18 1142 08/21/18 1643 08/21/18 2148 08/22/18 0730  08/22/18 1205  GLUCAP 178* 209* 165* 128* 142*   Lipid Profile: No results for input(s): CHOL, HDL, LDLCALC, TRIG, CHOLHDL, LDLDIRECT in the last 72 hours. Thyroid Function Tests: No results for input(s): TSH, T4TOTAL, FREET4, T3FREE, THYROIDAB in the last 72 hours. Anemia Panel: No results for input(s): VITAMINB12, FOLATE, FERRITIN, TIBC, IRON, RETICCTPCT in the last 72 hours. Urine analysis:    Component Value Date/Time   COLORURINE YELLOW 08/19/2018 0659   APPEARANCEUR CLEAR 08/19/2018 0659   LABSPEC 1.029 08/19/2018 0659   PHURINE 5.0 08/19/2018 0659   GLUCOSEU >=500 (A) 08/19/2018 0659   HGBUR SMALL (A) 08/19/2018 0659   BILIRUBINUR NEGATIVE 08/19/2018 0659   KETONESUR 5 (A) 08/19/2018 0659   PROTEINUR 100 (A) 08/19/2018 0659   UROBILINOGEN 0.2 09/04/2010 1241   NITRITE NEGATIVE 08/19/2018 0659   LEUKOCYTESUR NEGATIVE 08/19/2018 0659   Sepsis Labs: @LABRCNTIP (procalcitonin:4,lacticidven:4)  ) Recent Results (from the past 240 hour(s))  Urine culture     Status: None   Collection Time:  08/15/18  8:11 PM  Result Value Ref Range Status   Specimen Description URINE, CLEAN CATCH  Final   Special Requests NONE  Final   Culture   Final    NO GROWTH Performed at [facility], [place] [address] [place], [place] [address]    Report Status 08/17/2018 FINAL  Final  Blood Culture (routine x 2)     Status: None (Preliminary result)   Collection Time: 08/19/18  3:04 AM  Result Value Ref Range Status   Specimen Description BLOOD RIGHT ANTECUBITAL  Final   Special Requests   Final    BOTTLES DRAWN AEROBIC AND ANAEROBIC Blood Culture results may not be optimal due to an inadequate volume of blood received in culture bottles   Culture   Final    NO GROWTH 3 DAYS Performed at [facility], [place] [address], [place], [place] [address]    Report Status PENDING  Incomplete  Blood Culture (routine x 2)     Status: None (Preliminary result)   Collection Time: 08/19/18  3:40 AM  Result Value Ref Range Status   Specimen Description BLOOD RIGHT FOREARM  Final   Special Requests   Final    BOTTLES DRAWN AEROBIC AND ANAEROBIC Blood Culture results may not be optimal due to an excessive volume of blood received in culture bottles   Culture   Final    NO GROWTH 3 DAYS Performed at [facility], [place] [address] [place], [place] [address]    Report Status PENDING  Incomplete  MRSA PCR Screening     Status: None   Collection Time: 08/19/18  6:27 AM  Result Value Ref Range Status   MRSA by PCR NEGATIVE NEGATIVE Final    Comment:        The GeneXpert MRSA Assay (FDA approved for NASAL specimens only), is one component of a comprehensive MRSA colonization surveillance program. It is not intended to diagnose MRSA infection nor to guide or monitor treatment for MRSA infections. Performed at [facility], [place] [address] [place], [place] [address]   Respiratory Panel by PCR     Status: None   Collection Time: 08/20/18  7:25 AM  Result Value Ref Range Status    Adenovirus NOT DETECTED NOT DETECTED Final   Coronavirus 229E NOT DETECTED NOT DETECTED Final   Coronavirus HKU1 NOT DETECTED NOT DETECTED Final   Coronavirus NL63 NOT DETECTED NOT DETECTED Final   Coronavirus OC43  NOT DETECTED NOT DETECTED Final   Metapneumovirus NOT DETECTED NOT DETECTED Final   Rhinovirus / Enterovirus NOT DETECTED NOT DETECTED Final   Influenza A NOT DETECTED NOT DETECTED Final   Influenza B NOT DETECTED NOT DETECTED Final   Parainfluenza Virus 1 NOT DETECTED NOT DETECTED Final   Parainfluenza Virus 2 NOT DETECTED NOT DETECTED Final   Parainfluenza Virus 3 NOT DETECTED NOT DETECTED Final   Parainfluenza Virus 4 NOT DETECTED NOT DETECTED Final   Respiratory Syncytial Virus NOT DETECTED NOT DETECTED Final   Bordetella pertussis NOT DETECTED NOT DETECTED Final   Chlamydophila pneumoniae NOT DETECTED NOT DETECTED Final   Mycoplasma pneumoniae NOT DETECTED NOT DETECTED Final    Comment: Performed at [facility], [place] [address], [place], [place] [address]      Studies: No results found.  Scheduled Meds: . amLODipine  2.5 mg Oral Daily  . aspirin EC  81 mg Oral Daily  . atorvastatin  40 mg Oral q1800  . donepezil  10 mg Oral QPM  . furosemide  40 mg Oral Daily  . insulin aspart  0-15 Units Subcutaneous TID WC  . insulin aspart  0-5 Units Subcutaneous QHS  . insulin detemir  10 Units Subcutaneous BID  . levothyroxine  56 mcg Oral QAC breakfast  . loratadine  10 mg Oral Daily  . losartan  50 mg Oral Daily  . metoCLOPramide  5 mg Oral TID AC  . metoprolol succinate  25 mg Oral Daily  . potassium chloride  40 mEq Oral Q4H  . rivaroxaban  20 mg Oral QAC supper    Continuous Infusions:   LOS: 3 days    Romie Minus, PA-S

## 2018-08-23 LAB — BASIC METABOLIC PANEL
Anion gap: 10 (ref 5–15)
BUN: 11 mg/dL (ref 8–23)
CALCIUM: 8.2 mg/dL — AB (ref 8.9–10.3)
CO2: 24 mmol/L (ref 22–32)
CREATININE: 0.68 mg/dL (ref 0.44–1.00)
Chloride: 102 mmol/L (ref 98–111)
GFR calc Af Amer: >60 mL/min (ref >60)
GFR calc non Af Amer: >60 mL/min (ref >60)
Glucose, Bld: 164 mg/dL — ABNORMAL HIGH (ref 70–99)
Potassium: 3.3 mmol/L — ABNORMAL LOW (ref 3.5–5.1)
Sodium: 136 mmol/L (ref 135–145)

## 2018-08-23 LAB — GLUCOSE, CAPILLARY
Glucose-Capillary: 134 mg/dL — ABNORMAL HIGH (ref 70–99)
Glucose-Capillary: 228 mg/dL — ABNORMAL HIGH (ref 70–99)
Glucose-Capillary: 257 mg/dL — ABNORMAL HIGH (ref 70–99)
Glucose-Capillary: 188 mg/dL — ABNORMAL HIGH (ref 70–99)
Glucose-Capillary: 155 mg/dL — ABNORMAL HIGH (ref 70–99)

## 2018-08-23 LAB — MAGNESIUM: Magnesium: 1.5 mg/dL — ABNORMAL LOW (ref 1.7–2.4)

## 2018-08-23 MED ORDER — POTASSIUM CHLORIDE CRYS ER 20 MEQ PO TBCR
40.0000 meq | EXTENDED_RELEASE_TABLET | Freq: Once | ORAL | Status: AC
  Administered 2018-08-23: 40 meq via ORAL
  Filled 2018-08-23: qty 2

## 2018-08-23 MED ORDER — SODIUM CHLORIDE 0.9% FLUSH
10.0000 mL | Freq: Two times a day (BID) | INTRAVENOUS | Status: DC
  Administered 2018-08-23 – 2018-08-26 (×6): 10 mL

## 2018-08-23 MED ORDER — MAGNESIUM SULFATE 2 GM/50ML IV SOLN
2.0000 g | Freq: Once | INTRAVENOUS | Status: AC
  Administered 2018-08-23: 2 g via INTRAVENOUS
  Filled 2018-08-23: qty 50

## 2018-08-23 MED ORDER — SODIUM CHLORIDE 0.9% FLUSH: 10.0000 mL | INTRAVENOUS | Status: DC | PRN

## 2018-08-23 NOTE — Progress Notes (Signed)
PROGRESS NOTE    Lindsay Nguyen  HBZ:[id number] DOB: November 07, 1935 DOA: 08/19/2018 PCP: [name], MD    Brief Narrative:  83 year old female who presented with weakness, she does have significant past medical history for coronary artery disease, type 2 diabetes mellitus and atrial fibrillation.  Patient was brought to the hospital due to significant weakness, and fever, associated with poor oral intake for about 24 hours.  On her initial physical examination blood pressure 159/80, heart rate 88, respiratory rate 26, oxygen saturation 88% to 96%, her lungs were clear to auscultation bilaterally, heart S1-S2 present and rhythmic, abdomen soft nontender, no lower extremity edema.  She was admitted to the hospital working diagnosis of febrile syndrome to rule out sepsis.     Assessment & Plan:   Principal Problem:   Fever Active Problems:   Essential hypertension   Hx of CABG   PAF (paroxysmal atrial fibrillation) (HCC)   Dementia (HCC)   Weakness   Chronic diastolic CHF (congestive heart failure) ([place])   Sepsis ([place])   1. Febrile syndrome, present on admission. Ruled out sepsis. Off antibiotic therapy, patient has continue to be stable with no signs of infection. Continue to be very weak and deconditioned.   2. HTN. On metoprolol, losartan, amlodipine, for blood pressure control. Patient at home not taking furosemide per her report.   3. Paroxysmal atrial fibrillation. Patient has remained rate controlled with metoprolol. Continue anticoagulation with rivaroxaban with good toleration.   4. Chronic and stable diastolic heart failure. Last echocardiogram with LV systolic function 60 to 10%, continue to hold on IV fluids.   5. Hypokalemia and hypomagnesemia. Persistent hypokalemia, will continue correction with po Kcl and will add IV Mg, will follow on renal panel in am.   6. T2DM. Glucose cover and monitoring with insulin sliding scale, plus a basal insulin with glargine  10 units bid. Patient is tolerating po well.   7. Hypothyroid. On levothyroxine, with good toleration.   DVT prophylaxis: rivaroxaban   Code Status: dnr  Family Communication: I spoke with patient's family at the bedside and all questions were addressed.  Disposition: Pending placement SNF.   Body mass index is 28.32 kg/m. Malnutrition Type:      Malnutrition Characteristics:      Nutrition Interventions:     RN Pressure Injury Documentation:     Consultants:     Procedures:     Antimicrobials:       Subjective: Patient is feeling better, continue to be very weak and deconditioned, is out of bed to chair, no nausea or vomiting, no chest pain or dyspnea.   Objective: Vitals:   08/22/18 2300 08/23/18 0300 08/23/18 0800 08/23/18 1217  BP: (!) 145/73  132/68 (!) 155/77  Pulse: 68 79 88   Resp: (!) 98 14 19   Temp: 98.6 F (37 C)  98.4 F (36.9 C) 98.3 F (36.8 C)  TempSrc: Oral  Oral Oral  SpO2: 95% 94% 92%   Weight:      Height:        Intake/Output Summary (Last 24 hours) at 08/23/2018 1559 Last data filed at 08/22/2018 2300 Gross per 24 hour  Intake 240 ml  Output -  Net 240 ml   Filed Weights   08/19/18 0256  Weight: 87 kg    Examination:   General: Not in pain or dyspnea, deconditioned  Neurology: Awake and alert, non focal  E ENT: mid pallor, no icterus, oral mucosa moist Cardiovascular: No JVD. S1-S2  present, rhythmic, no gallops, rubs, or murmurs. No lower extremity edema. Pulmonary: positive breath sounds bilaterally, adequate air movement, no wheezing, rhonchi or rales. Gastrointestinal. Abdomen with no organomegaly, non tender, no rebound or guarding Skin. No rashes Musculoskeletal: no joint deformities     Data Reviewed: I have personally reviewed following labs and imaging studies  CBC: Recent Labs  Lab 08/19/18 0304 08/20/18 0241  WBC 19.4* 16.3*  NEUTROABS 16.6*  --   HGB 14.5 12.6  HCT 42.9 36.9  MCV 96.2  98.1  PLT 193 841*   Basic Metabolic Panel: Recent Labs  Lab 08/18/18 1254 08/19/18 0304 08/20/18 0241 08/21/18 0736 08/22/18 0219 08/23/18 0243  NA 138 137 137 134* 137 136  K 4.0 3.9 3.4* 3.5 3.0* 3.3*  CL 95* 102 106 104 104 102  CO2 26 24 21* 20* 23 24  GLUCOSE 312* 357* 186* 209* 161* 164*  BUN 17 16 13 9 11 11   CREATININE 1.00 1.05* 0.86 0.71 0.74 0.68  CALCIUM 9.1 9.2 8.3* 7.9* 8.1* 8.2*  MG 1.6  --   --   --   --  1.5*   GFR: Estimated Creatinine Clearance: 66 mL/min (by C-G formula based on SCr of 0.68 mg/dL). Liver Function Tests: Recent Labs  Lab 08/19/18 0304  AST 19  ALT 14  ALKPHOS 67  BILITOT 0.8  PROT 7.4  ALBUMIN 3.5   No results for input(s): LIPASE, AMYLASE in the last 168 hours. No results for input(s): AMMONIA in the last 168 hours. Coagulation Profile: No results for input(s): INR, PROTIME in the last 168 hours. Cardiac Enzymes: No results for input(s): CKTOTAL, CKMB, CKMBINDEX, TROPONINI in the last 168 hours. BNP (last 3 results) No results for input(s): PROBNP in the last 8760 hours. HbA1C: No results for input(s): HGBA1C in the last 72 hours. CBG: Recent Labs  Lab 08/22/18 1618 08/22/18 2127 08/23/18 0751 08/23/18 1215 08/23/18 1445  GLUCAP 166* 217* 134* 228* 257*   Lipid Profile: No results for input(s): CHOL, HDL, LDLCALC, TRIG, CHOLHDL, LDLDIRECT in the last 72 hours. Thyroid Function Tests: No results for input(s): TSH, T4TOTAL, FREET4, T3FREE, THYROIDAB in the last 72 hours. Anemia Panel: No results for input(s): VITAMINB12, FOLATE, FERRITIN, TIBC, IRON, RETICCTPCT in the last 72 hours.    Radiology Studies: I have reviewed all of the imaging during this hospital visit personally     Scheduled Meds: . amLODipine  2.5 mg Oral Daily  . aspirin EC  81 mg Oral Daily  . atorvastatin  40 mg Oral q1800  . donepezil  10 mg Oral QPM  . insulin aspart  0-15 Units Subcutaneous TID WC  . insulin aspart  0-5 Units  Subcutaneous QHS  . insulin detemir  10 Units Subcutaneous BID  . levothyroxine  56 mcg Oral QAC breakfast  . loratadine  10 mg Oral Daily  . losartan  50 mg Oral Daily  . metoCLOPramide  5 mg Oral TID AC  . metoprolol succinate  25 mg Oral Daily  . rivaroxaban  20 mg Oral QAC supper   Continuous Infusions: . magnesium sulfate 1 - 4 g bolus IVPB       LOS: 4 days        Lindsay Nguyen Gerome Apley, MD [facility] Pager [phone]

## 2018-08-23 NOTE — Clinical Social Work Placement (Signed)
   CLINICAL SOCIAL WORK PLACEMENT  NOTE  Date:  08/23/2018  Patient Details  Name: STEPHAIE Nguyen MRN: [id number] Date of Birth: Nov 18, 1935  Clinical Social Work is seeking post-discharge placement for this patient at the [place] level of care (*CSW will initial, date and re-position this form in  chart as items are completed):      Patient/family provided with [facility] Work Department's list of facilities offering this level of care within the geographic area requested by the patient (or if unable, by the patient's family).      Patient/family informed of their freedom to choose among providers that offer the needed level of care, that participate in Medicare, Medicaid or managed care program needed by the patient, have an available bed and are willing to accept the patient.      Patient/family informed of [facility]'s ownership interest in [facility] and [facility], as well as of the fact that they are under no obligation to receive care at these facilities.  PASRR submitted to EDS on 08/23/18     PASRR number received on       Existing PASRR number confirmed on 08/23/18     FL2 transmitted to all facilities in geographic area requested by pt/family on 08/23/18     FL2 transmitted to all facilities within larger geographic area on       Patient informed that his/her managed care company has contracts with or will negotiate with certain facilities, including the following:            Patient/family informed of bed offers received.  Patient chooses bed at       Physician recommends and patient chooses bed at      Patient to be transferred to   on  .  Patient to be transferred to facility by       Patient family notified on   of transfer.  Name of family member notified:        PHYSICIAN Please sign FL2     Additional Comment:    _______________________________________________ Candie Chroman, LCSW 08/23/2018, 12:29  PM

## 2018-08-23 NOTE — NC FL2 (Signed)
[place] MEDICAID FL2 LEVEL OF CARE SCREENING TOOL     IDENTIFICATION  Patient Name: Lindsay Nguyen Birthdate: 09/17/1935 Sex: female Admission Date (Current Location): 08/19/2018  [facility] and [place] Number:  Herbalist and Address:  [facility], [place] [address] [place],  [address]      Provider Number: [id number]  Attending Physician Name and Address:  Tawni Millers  Relative Name and Phone Number:       Current Level of Care: Hospital Recommended Level of Care: [place] Prior Approval Number:    Date Approved/Denied:   PASRR Number: [id number]  Discharge Plan: SNF    Current Diagnoses: Patient Active Problem List   Diagnosis Date Noted  . Fever 08/19/2018  . Chronic diastolic CHF (congestive heart failure) ([place]) 08/19/2018  . Sepsis ([place]) 08/19/2018  . Weakness 08/18/2018  . Medication noncompliance due to cognitive impairment   . TIA (transient ischemic attack) 09/29/2017  . Dementia ([place]) 09/29/2017  . History of depression 11/17/2016  . Bradycardia, sinus 10/22/2015  . Acute diastolic (congestive) heart failure ([place]) 10/22/2015  . Chronic anticoagulation 10/22/2015  . COPD with asthma ([place]) 10/22/2015  . PAF (paroxysmal atrial fibrillation) ([place]) 08/07/2015  . Metabolic disorder, iron 56/21/3086  . PVD (peripheral vascular disease)-  01/20/2013  . Bronchitis 01/20/2013  . Bronchopneumonia 04/04/2011  . Myalgia 10/26/2010  . PLEURAL EFFUSION, RIGHT 08/20/2010  . Hx of CABG 05/01/2010  . Hypothyroidism 08/20/2009  . Controlled diabetes mellitus type II without complication ([place]) 57/84/6962  . HLD (hyperlipidemia) 08/20/2009  . Essential hypertension 08/20/2009  . GERD 08/20/2009  . OSTEOPENIA 08/20/2009    Orientation RESPIRATION BLADDER Height & Weight     Self, Time, Situation, Place  Normal Incontinent, External catheter Weight: 191 lb 12.8 oz (87 kg) Height:  5\' 9"  (175.3  cm)  BEHAVIORAL SYMPTOMS/MOOD NEUROLOGICAL BOWEL NUTRITION STATUS  (None) (Dementia) Continent Diet(Heart healthy/carb modified.)  AMBULATORY STATUS COMMUNICATION OF NEEDS Skin   Limited Assist Verbally Normal                       Personal Care Assistance Level of Assistance  Bathing, Feeding, Dressing Bathing Assistance: Limited assistance Feeding assistance: Limited assistance Dressing Assistance: Limited assistance     Functional Limitations Info  Sight, Hearing, Speech Sight Info: Adequate Hearing Info: Adequate Speech Info: Adequate    SPECIAL CARE FACTORS FREQUENCY  PT (By licensed PT), Blood pressure, OT (By licensed OT)     PT Frequency: 5 x week OT Frequency: 5 x week            Contractures Contractures Info: Not present    Additional Factors Info  Code Status, Allergies, Psychotropic Code Status Info: DNR Allergies Info: Contrast Media (Iodinated Diagnostic Agents), Iodine, Shellfish Allergy, Gabapentin. Psychotropic Info: History of depression: No psychotropic meds.         Current Medications (08/23/2018):  This is the current hospital active medication list Current Facility-Administered Medications  Medication Dose Route Frequency Provider Last Rate Last Dose  . acetaminophen (TYLENOL) tablet 650 mg  650 mg Oral Q6H PRN Phillips Grout, MD   650 mg at 08/22/18 0159   Or  . acetaminophen (TYLENOL) suppository 650 mg  650 mg Rectal Q6H PRN Phillips Grout, MD      . albuterol (PROVENTIL) (2.5 MG/3ML) 0.083% nebulizer solution 2.5 mg  2.5 mg Nebulization Q2H PRN Phillips Grout, MD   2.5 mg at 08/21/18  1114  . amLODipine (NORVASC) tablet 2.5 mg  2.5 mg Oral Daily Charlynne Cousins, MD   2.5 mg at 08/23/18 0931  . aspirin EC tablet 81 mg  81 mg Oral Daily Derrill Kay A, MD   81 mg at 08/23/18 0931  . atorvastatin (LIPITOR) tablet 40 mg  40 mg Oral q1800 Phillips Grout, MD   40 mg at 08/22/18 1709  . budesonide (PULMICORT) nebulizer solution  0.25 mg  0.25 mg Nebulization Daily PRN Derrill Kay A, MD      . donepezil (ARICEPT) tablet 10 mg  10 mg Oral QPM Charlynne Cousins, MD   10 mg at 08/22/18 1727  . hydrALAZINE (APRESOLINE) injection 5 mg  5 mg Intravenous Q6H PRN Charlynne Cousins, MD      . insulin aspart (novoLOG) injection 0-15 Units  0-15 Units Subcutaneous TID WC Charlynne Cousins, MD   3 Units at 08/22/18 1710  . insulin aspart (novoLOG) injection 0-5 Units  0-5 Units Subcutaneous QHS Charlynne Cousins, MD   2 Units at 08/22/18 2134  . insulin detemir (LEVEMIR) injection 10 Units  10 Units Subcutaneous BID Charlynne Cousins, MD   10 Units at 08/23/18 0930  . levothyroxine (SYNTHROID, LEVOTHROID) tablet 56 mcg  56 mcg Oral QAC breakfast Charlynne Cousins, MD   56 mcg at 08/23/18 [phone]  . loratadine (CLARITIN) tablet 10 mg  10 mg Oral Daily Derrill Kay A, MD   10 mg at 08/23/18 0931  . losartan (COZAAR) tablet 50 mg  50 mg Oral Daily Charlynne Cousins, MD   50 mg at 08/23/18 0930  . magnesium sulfate IVPB 2 g 50 mL  2 g Intravenous Once Arrien, Jimmy Picket, MD      . metoCLOPramide [facility]) tablet 5 mg  5 mg Oral TID AC Arrien, Jimmy Picket, MD   5 mg at 08/23/18 [phone]  . metoprolol succinate (TOPROL-XL) 24 hr tablet 25 mg  25 mg Oral Daily Derrill Kay A, MD   25 mg at 08/23/18 0931  . nitroGLYCERIN (NITROSTAT) SL tablet 0.4 mg  0.4 mg Sublingual Q5 min PRN Phillips Grout, MD      . ondansetron [facility]) tablet 4 mg  4 mg Oral Q6H PRN Phillips Grout, MD       Or  . ondansetron (ZOFRAN) injection 4 mg  4 mg Intravenous Q6H PRN Derrill Kay A, MD      . potassium chloride SA (K-DUR,KLOR-CON) CR tablet 40 mEq  40 mEq Oral Once Arrien, Jimmy Picket, MD      . rivaroxaban [facility]) tablet 20 mg  20 mg Oral QAC supper Phillips Grout, MD   20 mg at 08/22/18 1727     Discharge Medications: Please see discharge summary for a list of discharge medications.  Relevant Imaging Results:  Relevant  Lab Results:   Additional Information SS#: [id number]  Candie Chroman, LCSW

## 2018-08-23 NOTE — Progress Notes (Signed)
Occupational Therapy Treatment Patient Details Name: Lindsay Nguyen MRN: [id number] DOB: 10/03/1935 Today's Date: 08/23/2018    History of present illness Pt is an 83 y.o. female admitted 08/19/18 with fever, nausea and weakness; worked up for [employer] of unknown source. PMH includes HTN, CABG, PAF, CVA, CHF, dementia.   OT comments  This 83 yo female admitted with above presents to acute OT making progress with transfers (+1 A now) and basic ADLs (toileting and grooming). She will continue to benefit from acute OT with follow up at SNF to get back to PLOF.   Follow Up Recommendations  SNF;Supervision/Assistance - 24 hour    Equipment Recommendations  Other (comment)(TBD at next venue)       Precautions / Restrictions Precautions Precautions: Fall Precaution Comments: Bladder and bowel urgency/incontinence Restrictions Weight Bearing Restrictions: No       Mobility Bed Mobility               General bed mobility comments: Pt up in recliner upon my arrival  Transfers Overall transfer level: Needs assistance Equipment used: 1 person hand held assist Transfers: Sit to/from Stand;Stand Pivot Transfers Sit to Stand: Min assist Stand pivot transfers: Min assist            Balance Overall balance assessment: Needs assistance Sitting-balance support: Feet supported;No upper extremity supported Sitting balance-Leahy Scale: Good     Standing balance support: Single extremity supported Standing balance-Leahy Scale: Poor Standing balance comment: Reliant on UE support and external assist to maintain standing balance                           ADL either performed or assessed with clinical judgement   ADL Overall ADL's : Needs assistance/impaired     Grooming: Supervision/safety;Set up;Sitting;Oral care                   Toilet Transfer: Minimal assistance;Stand-pivot;BSC   Toileting- Clothing Manipulation and Hygiene: Minimal assistance;Sit  to/from stand               Vision Baseline Vision/History: Wears glasses Wears Glasses: At all times Patient Visual Report: No change from baseline            Cognition Arousal/Alertness: Awake/alert Behavior During Therapy: WFL for tasks assessed/performed Overall Cognitive Status: Within Functional Limits for tasks assessed                                                     Pertinent Vitals/ Pain       Pain Assessment: 0-10 Pain Score: 2  Pain Location: a little pain in my abdomen at times Pain Descriptors / Indicators: Discomfort Pain Intervention(s): Monitored during session     Prior Functioning/Environment              Frequency  Min 2X/week        Progress Toward Goals  OT Goals(current goals can now be found in the care plan section)  Progress towards OT goals: Progressing toward goals     Plan Discharge plan remains appropriate       AM-PAC OT "6 Clicks" Daily Activity     Outcome Measure   Help from another person eating meals?: None Help from another person taking care of personal grooming?: A Little Help from another person  toileting, which includes using toliet, bedpan, or urinal?: A Little Help from another person bathing (including washing, rinsing, drying)?: A Lot Help from another person to put on and taking off regular upper body clothing?: A Little Help from another person to put on and taking off regular lower body clothing?: A Lot 6 Click Score: 17    End of Session    OT Visit Diagnosis: Unsteadiness on feet (R26.81);Other abnormalities of gait and mobility (R26.89);Muscle weakness (generalized) (M62.81)   Activity Tolerance Patient tolerated treatment well   Patient Left in chair;with call bell/phone within reach(Pt already up in recliner when I entered room without chair alarm)           Time: 8115-7262 OT Time Calculation (min): 22 min  Charges: OT General Charges $OT Visit: 1 Visit OT  Treatments $Self Care/Home Management : 8-22 mins  [place], OTR/L Acute [employer] Pager [phone] Office [phone]      Almon Register 08/23/2018, 11:16 AM

## 2018-08-23 NOTE — Progress Notes (Signed)
PROGRESS NOTE  [place] Mcgaugh CLE:[id number] DOB: 10-28-1935 DOA: 08/19/2018 PCP: [name], MD  HPI/Brief Narrative  Avanell Shackleton Bozard is a 83 y.o. year old female with medical history significant for CAD, Type 2 DM, A fib on Xarelto, HTN, HLD, Stroke, NSTEMI, thoracic aortic aneurysm s/p resection and grafting, Dementia, Gastroparesis who presented on 08/19/2018 with fever, nausea, and generalized weakness and admitted for evaluation of fever. Patient's family states the pt has had physical deconditioning recently and started receiving home physical therapy. Her son and daughter state she was doing well with the exercises last week, but then started experiencing intermittent fevers over 3 days. Her highest fever at home was 101.1. She then developed generalized weakness and nausea and presented to the ED on 08/19/18. She denies any CP or SOB. She denies any vomiting or diarrhea. She denies any dysuria, urgency, or frequency.    Hospital Course: In the ED, the patient was febrile with a white count of 19.4. Her BP was stable at 179/73 and pulse at 76 bpm. Septic work-up was initiated. She was started empirically on IV Vancomycin and Cefepime as well as IV fluids. Lactic acid was elevated at 2.2. CXR showed no evidence of acute cardiopulm disease. UA did not show any evidence of infection. Procalcitonin was < 0.10. Blood cultures were negative. Vitals have remained stable throughout her admission. She has been afebrile. Her white count went down to 16.3 on 08/20/18. She did experience some increased SOB and tachypnea on 1/18, which improved after being on nasal cannula O2 and receiving nebulizer treatment. She now denies SOB and O2 sat is remaining above 90% on RA. She was noted to be hypokalemic and given PO KCl replacement. Her K improved from 3 to 3.3 today. Her mag level was noted to be mildly low at 1.5 today. She was given additional PO KCl as well as IV Mag Sulfate replacement  today.  Assessment/Plan:  1. Febrile syndrome with generalized weakness- Patient remains hemodynamically stable and afebrile today. Sepsis was ruled out. Blood cultures were negative. UA was negative for infection. CXR didn't show any evidence of PNA or other acute cardiopulm disease. Procalcitonin was < 0.10. Her respiratory panel was negative. She is tolerating PO. Patient was on empiric tx w/ IV Vancomycin and Cefepime, but stopped empiric abx therapy on 08/22/18 since sepsis was ruled out. Suspect patient's sx's were viral in origin. PT and OT evaluated the patient and are recommending SNF for the patient's recovery at this time.   2. Hypokalemia- Patient's K improved from 3 yesterday to 3.3. Will give additional 40 MEq of PO KCl today. Her mag level was noted to be mildly low today at 1.5. Will give 2g IV Mag Sulfate. Will continue to monitor BMP.   3. Gastroparesis- Patient has hx of gastroparesis and typically takes Reglan 5 mg before meals, but has not been taking it since being in the hospital. She was complaining of some intermittent lower abdominal pain over the past couple of days. Restarted the patient on Reglan 5 mg TID.  4. Essential HTN- Stable. BP remains stable today. Will continue home medications of Metoprolol, Losartan, and Norvasc.   5. Paroxysmal Atrial Fibrillation- Patient remains in sinus rhythm today with HR in the 60's-80's. Will continue home Xarelto. Will discontinue Telemetry at this time.   6. Dementia-Stable. Continue patient's home Aricept.  7. Hx of NSTEMI and CABG- Stable. Patient is chest pain free today. Continue home aspirin and Xarelto.   Cultures:  Blood cultures negative  Telemetry: Patient was on Telemetry, being taken off today   DVT prophylaxis: Xarelto  Consultants:  PT, OT, social work    Procedures:  none   Antimicrobials:  Code Status: DNR   Family Communication: No family in the room today.    Disposition Plan: Discharge to  SNF pending placement.   Subjective Patient states overall she is doing well today. She is eating breakfast this morning with no complaints. She denies any fever or chills. She denies any CP or SOB. She denies any abdominal pain, nausea, or vomiting.   Objective: Vitals:   08/22/18 1920 08/22/18 1939 08/22/18 2300 08/23/18 0300  BP:  (!) 173/85 (!) 145/73   Pulse: 84 90 68 79  Resp: (!) 21 (!) 23 (!) 98 14  Temp:  98.6 F (37 C) 98.6 F (37 C)   TempSrc:  Oral Oral   SpO2: 92% 94% 95% 94%  Weight:      Height:        Intake/Output Summary (Last 24 hours) at 08/23/2018 3716 Last data filed at 08/22/2018 2300 Gross per 24 hour  Intake 240 ml  Output 1100 ml  Net -860 ml   Filed Weights   08/19/18 0256  Weight: 87 kg    Exam: Constitutional:normal appearing female Eyes: EOMI, anicteric, normal conjunctivae ENMT: Oropharynx with moist mucous membranes, normal dentition Neck: FROM Cardiovascular: RRR no MRGs, with no peripheral edema Respiratory: Normal respiratory effort, clear breath sounds  Abdomen: Soft,non-tender, with no HSM Skin: No rash ulcers, or lesions. Without skin tenting  Neurologic: Grossly no focal neuro deficit. Able to move all extremities.  Psychiatric:Appropriate affect, and mood. Mental status AAOx3  Data Reviewed: CBC: Recent Labs  Lab 08/19/18 0304 08/20/18 0241  WBC 19.4* 16.3*  NEUTROABS 16.6*  --   HGB 14.5 12.6  HCT 42.9 36.9  MCV 96.2 98.1  PLT 193 967*   Basic Metabolic Panel: Recent Labs  Lab 08/18/18 1254 08/19/18 0304 08/20/18 0241 08/21/18 0736 08/22/18 0219 08/23/18 0243  NA 138 137 137 134* 137 136  K 4.0 3.9 3.4* 3.5 3.0* 3.3*  CL 95* 102 106 104 104 102  CO2 26 24 21* 20* 23 24  GLUCOSE 312* 357* 186* 209* 161* 164*  BUN 17 16 13 9 11 11   CREATININE 1.00 1.05* 0.86 0.71 0.74 0.68  CALCIUM 9.1 9.2 8.3* 7.9* 8.1* 8.2*  MG 1.6  --   --   --   --  1.5*   GFR: Estimated Creatinine Clearance: 66 mL/min (by C-G  formula based on SCr of 0.68 mg/dL). Liver Function Tests: Recent Labs  Lab 08/19/18 0304  AST 19  ALT 14  ALKPHOS 67  BILITOT 0.8  PROT 7.4  ALBUMIN 3.5   No results for input(s): LIPASE, AMYLASE in the last 168 hours. No results for input(s): AMMONIA in the last 168 hours. Coagulation Profile: No results for input(s): INR, PROTIME in the last 168 hours. Cardiac Enzymes: No results for input(s): CKTOTAL, CKMB, CKMBINDEX, TROPONINI in the last 168 hours. BNP (last 3 results) No results for input(s): PROBNP in the last 8760 hours. HbA1C: No results for input(s): HGBA1C in the last 72 hours. CBG: Recent Labs  Lab 08/22/18 0730 08/22/18 1205 08/22/18 1618 08/22/18 2127 08/23/18 0751  GLUCAP 128* 142* 166* 217* 134*   Lipid Profile: No results for input(s): CHOL, HDL, LDLCALC, TRIG, CHOLHDL, LDLDIRECT in the last 72 hours. Thyroid Function Tests: No results for input(s): TSH, T4TOTAL, FREET4,  T3FREE, THYROIDAB in the last 72 hours. Anemia Panel: No results for input(s): VITAMINB12, FOLATE, FERRITIN, TIBC, IRON, RETICCTPCT in the last 72 hours. Urine analysis:    Component Value Date/Time   COLORURINE YELLOW 08/19/2018 0659   APPEARANCEUR CLEAR 08/19/2018 0659   LABSPEC 1.029 08/19/2018 0659   PHURINE 5.0 08/19/2018 0659   GLUCOSEU >=500 (A) 08/19/2018 0659   HGBUR SMALL (A) 08/19/2018 0659   BILIRUBINUR NEGATIVE 08/19/2018 0659   KETONESUR 5 (A) 08/19/2018 0659   PROTEINUR 100 (A) 08/19/2018 0659   UROBILINOGEN 0.2 09/04/2010 1241   NITRITE NEGATIVE 08/19/2018 0659   LEUKOCYTESUR NEGATIVE 08/19/2018 0659   Sepsis Labs: @LABRCNTIP (procalcitonin:4,lacticidven:4)  ) Recent Results (from the past 240 hour(s))  Urine culture     Status: None   Collection Time: 08/15/18  8:11 PM  Result Value Ref Range Status   Specimen Description URINE, CLEAN CATCH  Final   Special Requests NONE  Final   Culture   Final    NO GROWTH Performed at [facility], [place] [address] [place], [place] [address]    Report Status 08/17/2018 FINAL  Final  Blood Culture (routine x 2)     Status: None (Preliminary result)   Collection Time: 08/19/18  3:04 AM  Result Value Ref Range Status   Specimen Description BLOOD RIGHT ANTECUBITAL  Final   Special Requests   Final    BOTTLES DRAWN AEROBIC AND ANAEROBIC Blood Culture results may not be optimal due to an inadequate volume of blood received in culture bottles   Culture   Final    NO GROWTH 3 DAYS Performed at [facility], [place] [address] [place], [place] [address]    Report Status PENDING  Incomplete  Blood Culture (routine x 2)     Status: None (Preliminary result)   Collection Time: 08/19/18  3:40 AM  Result Value Ref Range Status   Specimen Description BLOOD RIGHT FOREARM  Final   Special Requests   Final    BOTTLES DRAWN AEROBIC AND ANAEROBIC Blood Culture results may not be optimal due to an excessive volume of blood received in culture bottles   Culture   Final    NO GROWTH 3 DAYS Performed at [facility], [place] [address], [place], [place] [address]    Report Status PENDING  Incomplete  MRSA PCR Screening     Status: None   Collection Time: 08/19/18  6:27 AM  Result Value Ref Range Status   MRSA by PCR NEGATIVE NEGATIVE Final    Comment:        The GeneXpert MRSA Assay (FDA approved for NASAL specimens only), is one component of a comprehensive MRSA colonization surveillance program. It is not intended to diagnose MRSA infection nor to guide or monitor treatment for MRSA infections. Performed at [facility], [place] [address] [place], [place] [address]   Respiratory Panel by PCR     Status: None   Collection Time: 08/20/18  7:25 AM  Result Value Ref Range Status   Adenovirus NOT DETECTED NOT DETECTED Final   Coronavirus 229E NOT DETECTED NOT DETECTED Final   Coronavirus HKU1 NOT DETECTED NOT DETECTED Final   Coronavirus NL63 NOT DETECTED NOT DETECTED Final   Coronavirus  OC43 NOT DETECTED NOT DETECTED Final   Metapneumovirus NOT DETECTED NOT DETECTED Final   Rhinovirus / Enterovirus NOT DETECTED NOT DETECTED Final   Influenza A NOT DETECTED NOT DETECTED Final   Influenza B NOT DETECTED  NOT DETECTED Final   Parainfluenza Virus 1 NOT DETECTED NOT DETECTED Final   Parainfluenza Virus 2 NOT DETECTED NOT DETECTED Final   Parainfluenza Virus 3 NOT DETECTED NOT DETECTED Final   Parainfluenza Virus 4 NOT DETECTED NOT DETECTED Final   Respiratory Syncytial Virus NOT DETECTED NOT DETECTED Final   Bordetella pertussis NOT DETECTED NOT DETECTED Final   Chlamydophila pneumoniae NOT DETECTED NOT DETECTED Final   Mycoplasma pneumoniae NOT DETECTED NOT DETECTED Final    Comment: Performed at [facility], [place] [address], [place], [place] [address]      Studies: No results found.  Scheduled Meds: . amLODipine  2.5 mg Oral Daily  . aspirin EC  81 mg Oral Daily  . atorvastatin  40 mg Oral q1800  . donepezil  10 mg Oral QPM  . furosemide  40 mg Oral Daily  . insulin aspart  0-15 Units Subcutaneous TID WC  . insulin aspart  0-5 Units Subcutaneous QHS  . insulin detemir  10 Units Subcutaneous BID  . levothyroxine  56 mcg Oral QAC breakfast  . loratadine  10 mg Oral Daily  . losartan  50 mg Oral Daily  . metoCLOPramide  5 mg Oral TID AC  . metoprolol succinate  25 mg Oral Daily  . rivaroxaban  20 mg Oral QAC supper    Continuous Infusions:   LOS: 4 days   Romie Minus, PA-S

## 2018-08-23 NOTE — Clinical Social Work Note (Signed)
IV team in room with patient so called daughter. They do not have a first preference facility but will review bed offer list when she arrives late this afternoon. Daughter is aware that bed offer list is on front of patient's chart and to ask RN for it when she arrives.  Dayton Scrape, [place]

## 2018-08-24 LAB — GLUCOSE, CAPILLARY
GLUCOSE-CAPILLARY: 114 mg/dL — AB (ref 70–99)
GLUCOSE-CAPILLARY: 234 mg/dL — AB (ref 70–99)
Glucose-Capillary: 183 mg/dL — ABNORMAL HIGH (ref 70–99)
Glucose-Capillary: 157 mg/dL — ABNORMAL HIGH (ref 70–99)
Glucose-Capillary: 133 mg/dL — ABNORMAL HIGH (ref 70–99)
Glucose-Capillary: 190 mg/dL — ABNORMAL HIGH (ref 70–99)

## 2018-08-24 LAB — BASIC METABOLIC PANEL
Anion gap: 11 (ref 5–15)
BUN: 11 mg/dL (ref 8–23)
CO2: 25 mmol/L (ref 22–32)
Calcium: 8.8 mg/dL — ABNORMAL LOW (ref 8.9–10.3)
Chloride: 102 mmol/L (ref 98–111)
Creatinine, Ser: 0.78 mg/dL (ref 0.44–1.00)
GFR calc non Af Amer: >60 mL/min (ref >60)
GLUCOSE: 146 mg/dL — AB (ref 70–99)
Potassium: 3.4 mmol/L — ABNORMAL LOW (ref 3.5–5.1)
Sodium: 138 mmol/L (ref 135–145)

## 2018-08-24 LAB — CULTURE, BLOOD (ROUTINE X 2)
CULTURE: NO GROWTH
Culture: NO GROWTH

## 2018-08-24 LAB — MAGNESIUM: Magnesium: 1.8 mg/dL (ref 1.7–2.4)

## 2018-08-24 MED ORDER — POTASSIUM CHLORIDE CRYS ER 20 MEQ PO TBCR
40.0000 meq | EXTENDED_RELEASE_TABLET | Freq: Once | ORAL | Status: AC
  Administered 2018-08-24: 40 meq via ORAL
  Filled 2018-08-24: qty 2

## 2018-08-24 NOTE — Clinical Social Work Note (Addendum)
Met with patient. She said she did not discuss SNF with her children last night. Will follow up later. Per RN, children will be by early today to discuss with her.  Dayton Scrape, CSW [phone]  1:30 pm Called patient's daughter. She stated her brother is touring a few of the SNF's today and she will have him follow up with CSW when he arrives at the hospital.  Dayton Scrape, [place]  3:41 pm Received call from patient's daughter stating that her brother is sick and unable to tour facilities today. She asked that [facility] fax referral to [facility] in [place]. CSW spoke to admissions coordinator and she said daughter plans on touring the facility tomorrow. Daughter also asked that CSW send referral to [facility]. Admissions coordinator aware and will review.  Dayton Scrape, [place]

## 2018-08-24 NOTE — Progress Notes (Signed)
Physical Therapy Treatment Patient Details Name: Lindsay Nguyen MRN: [id number] DOB: March 31, 1936 Today's Date: 08/24/2018    History of Present Illness Pt is an 83 y.o. female admitted 08/19/18 with fever, nausea and weakness; worked up for [employer] of unknown source. PMH includes HTN, CABG, PAF, CVA, CHF, dementia.    PT Comments    Patient progressing very well towards their physical therapy goals. Requiring min assist for most aspects of functional mobility. Increased ambulation distance to two bouts of 30 feet with walker and close chair follow utilized. Continues with weakness as evidenced through knee instability during gait training. SNF remains appropriate.     Follow Up Recommendations  SNF;Supervision/Assistance - 24 hour     Equipment Recommendations  None recommended by PT    Recommendations for Other Services       Precautions / Restrictions Precautions Precautions: Fall Restrictions Weight Bearing Restrictions: No    Mobility  Bed Mobility Overal bed mobility: Needs Assistance Bed Mobility: Supine to Sit     Supine to sit: Min assist     General bed mobility comments: Min assist provided at trunk  Transfers Overall transfer level: Needs assistance Equipment used: Rolling walker (2 wheeled) Transfers: Sit to/from Stand Sit to Stand: Min assist         General transfer comment: Min assist provided to stand  Ambulation/Gait Ambulation/Gait assistance: Min guard Gait Distance (Feet): 30 Feet(x2) Assistive device: Rolling walker (2 wheeled) Gait Pattern/deviations: Step-through pattern;Decreased stride length     General Gait Details: Pt with mild knee instability, requiring min guard assist and close chair follow. cues for looking up and activity pacing   Stairs             Wheelchair Mobility    Modified Rankin (Stroke Patients Only)       Balance Overall balance assessment: Needs assistance Sitting-balance support: Feet  supported;No upper extremity supported Sitting balance-Leahy Scale: Good     Standing balance support: Bilateral upper extremity supported Standing balance-Leahy Scale: Poor                              Cognition Arousal/Alertness: Awake/alert Behavior During Therapy: WFL for tasks assessed/performed Overall Cognitive Status: Within Functional Limits for tasks assessed                                        Exercises      General Comments        Pertinent Vitals/Pain Pain Assessment: No/denies pain    Home Living                      Prior Function            PT Goals (current goals can now be found in the care plan section) Acute Rehab PT Goals Potential to Achieve Goals: Good Progress towards PT goals: Progressing toward goals    Frequency    Min 3X/week      PT Plan Current plan remains appropriate    Co-evaluation              AM-PAC PT "6 Clicks" Mobility   Outcome Measure  Help needed turning from your back to your side while in a flat bed without using bedrails?: A Little Help needed moving from lying on your back to sitting on the side  of a flat bed without using bedrails?: A Little Help needed moving to and from a bed to a chair (including a wheelchair)?: A Little Help needed standing up from a chair using your arms (e.g., wheelchair or bedside chair)?: A Little Help needed to walk in hospital room?: A Little Help needed climbing 3-5 steps with a railing? : A Lot 6 Click Score: 17    End of Session Equipment Utilized During Treatment: Gait belt Activity Tolerance: Patient tolerated treatment well Patient left: in chair;with call bell/phone within reach Nurse Communication: Mobility status PT Visit Diagnosis: Unsteadiness on feet (R26.81);Muscle weakness (generalized) (M62.81);Difficulty in walking, not elsewhere classified (R26.2)     Time: 1414-1430 PT Time Calculation (min) (ACUTE ONLY): 16  min  Charges:  $Gait Training: 8-22 mins                    Ellamae Sia, Kimiko, DPT Acute Rehabilitation Services Pager [phone] Office [phone]    Willy Eddy 08/24/2018, 4:30 PM

## 2018-08-24 NOTE — Plan of Care (Signed)

## 2018-08-24 NOTE — Discharge Instructions (Signed)

## 2018-08-24 NOTE — Progress Notes (Signed)
PROGRESS NOTE    Lindsay Nguyen  FIE:[id number] DOB: February 02, 1936 DOA: 08/19/2018 PCP: [name], MD    Brief Narrative:  83 year old female who presented with weakness, she does have significant past medical history for coronary artery disease, type 2 diabetes mellitus and atrial fibrillation.  Patient was brought to the hospital due to significant weakness, and fever, associated with poor oral intake for about 24 hours.  Now awaiting SNF placement.    Assessment & Plan:   Principal Problem:   Fever Active Problems:   Essential hypertension   Hx of CABG   PAF (paroxysmal atrial fibrillation) (HCC)   Dementia (HCC)   Weakness   Chronic diastolic CHF (congestive heart failure) (HCC)   Sepsis (HCC)   Febrile syndrome, present on admission. Ruled out sepsis.  Off antibiotic therapy, patient has continue to be stable with no signs of infection. Continue to be very weak and deconditioned, needing SNF placement   HTN. - On metoprolol, losartan, amlodipine, for blood pressure control. Patient at home not taking furosemide per her report.   Paroxysmal atrial fibrillation.  Patient has remained rate controlled with metoprolol. Continue anticoagulation with rivaroxaban with good toleration.   Chronic and stable diastolic heart failure.  Last echocardiogram with LV systolic function 60 to 16%  Hypokalemia and hypomagnesemia. -repleted both   T2DM. -SSI -lantus -hgbA1c: 8.9 -was on oral medications at home-- plan to d/c on insulin  Hypothyroid. On levothyroxine   DVT prophylaxis: rivaroxaban   Code Status: dnr  Family Communication: no family at bedside  Disposition: Pending placement SNF.     Subjective: No complaints this AM  Objective: Vitals:   08/23/18 0800 08/23/18 1217 08/23/18 1655 08/24/18 0437  BP: 132/68 (!) 155/77 (!) 183/79 (!) 174/82  Pulse: 88  68   Resp: 19     Temp: 98.4 F (36.9 C) 98.3 F (36.8 C) 98.2 F (36.8 C) 98 F (36.7  C)  TempSrc: Oral Oral Oral Oral  SpO2: 92%  98%   Weight:      Height:        Intake/Output Summary (Last 24 hours) at 08/24/2018 6063 Last data filed at 08/23/2018 2231 Gross per 24 hour  Intake 10 ml  Output 400 ml  Net -390 ml   Filed Weights   08/19/18 0256  Weight: 87 kg    Examination:   General: Not in pain or dyspnea, deconditioned  Neurology: Awake and alert, non focal  E ENT: mid pallor, no icterus, oral mucosa moist Cardiovascular: No JVD. S1-S2 present, rhythmic, no gallops, rubs, or murmurs. No lower extremity edema. Pulmonary: positive breath sounds bilaterally, adequate air movement, no wheezing, rhonchi or rales. Gastrointestinal. Abdomen with no organomegaly, non tender, no rebound or guarding Skin. No rashes Musculoskeletal: no joint deformities     Data Reviewed: I have personally reviewed following labs and imaging studies  CBC: Recent Labs  Lab 08/19/18 0304 08/20/18 0241  WBC 19.4* 16.3*  NEUTROABS 16.6*  --   HGB 14.5 12.6  HCT 42.9 36.9  MCV 96.2 98.1  PLT 193 016*   Basic Metabolic Panel: Recent Labs  Lab 08/18/18 1254  08/20/18 0241 08/21/18 0736 08/22/18 0219 08/23/18 0243 08/24/18 0347  NA 138   < > 137 134* 137 136 138  K 4.0   < > 3.4* 3.5 3.0* 3.3* 3.4*  CL 95*   < > 106 104 104 102 102  CO2 26   < > 21* 20* 23 24 25  GLUCOSE 312*   < > 186* 209* 161* 164* 146*  BUN 17   < > 13 9 11 11 11   CREATININE 1.00   < > 0.86 0.71 0.74 0.68 0.78  CALCIUM 9.1   < > 8.3* 7.9* 8.1* 8.2* 8.8*  MG 1.6  --   --   --   --  1.5* 1.8   < > = values in this interval not displayed.   GFR: Estimated Creatinine Clearance: 66 mL/min (by C-G formula based on SCr of 0.78 mg/dL). Liver Function Tests: Recent Labs  Lab 08/19/18 0304  AST 19  ALT 14  ALKPHOS 67  BILITOT 0.8  PROT 7.4  ALBUMIN 3.5   No results for input(s): LIPASE, AMYLASE in the last 168 hours. No results for input(s): AMMONIA in the last 168 hours. Coagulation  Profile: No results for input(s): INR, PROTIME in the last 168 hours. Cardiac Enzymes: No results for input(s): CKTOTAL, CKMB, CKMBINDEX, TROPONINI in the last 168 hours. BNP (last 3 results) No results for input(s): PROBNP in the last 8760 hours. HbA1C: No results for input(s): HGBA1C in the last 72 hours. CBG: Recent Labs  Lab 08/23/18 1215 08/23/18 1445 08/23/18 1657 08/23/18 2229 08/24/18 0744  GLUCAP 228* 257* 188* 155* 114*   Lipid Profile: No results for input(s): CHOL, HDL, LDLCALC, TRIG, CHOLHDL, LDLDIRECT in the last 72 hours. Thyroid Function Tests: No results for input(s): TSH, T4TOTAL, FREET4, T3FREE, THYROIDAB in the last 72 hours. Anemia Panel: No results for input(s): VITAMINB12, FOLATE, FERRITIN, TIBC, IRON, RETICCTPCT in the last 72 hours.    Radiology Studies: I have reviewed all of the imaging during this hospital visit personally     Scheduled Meds: . amLODipine  2.5 mg Oral Daily  . aspirin EC  81 mg Oral Daily  . atorvastatin  40 mg Oral q1800  . donepezil  10 mg Oral QPM  . insulin aspart  0-15 Units Subcutaneous TID WC  . insulin aspart  0-5 Units Subcutaneous QHS  . insulin detemir  10 Units Subcutaneous BID  . levothyroxine  56 mcg Oral QAC breakfast  . loratadine  10 mg Oral Daily  . losartan  50 mg Oral Daily  . metoCLOPramide  5 mg Oral TID AC  . metoprolol succinate  25 mg Oral Daily  . rivaroxaban  20 mg Oral QAC supper  . sodium chloride flush  10-40 mL Intracatheter Q12H   Continuous Infusions:    LOS: 5 days        Geradine Girt, DO [facility]

## 2018-08-24 NOTE — Consult Note (Signed)
            [facility] CM Primary Care Navigator  08/24/2018  Ket[place] Petrizzo [birth year] [id number]   Seenpatientat the bedsidetoidentify possible discharge needs.  Per MD note, patient presented with weakness and was admitted to the hospital with working diagnosis of febrile syndrome to rule out sepsis. (HTN, paroxysmal atrial fibrillation, hypokalemia and hypomagnesemia, DM II)  Patientendorses Dr. Jenna Luo with [facility] asherprimary care provider.   [facility] [place] [facility].  Patient reports that children [name] and Santiago Glad) have been managing hismedicationsat homewith use of"pillbox" system filledevery week.  Patientmentioned that her children have beenproviding transportation toherdoctors' appointments.  Patient's son lives with her. She verbalized that her children have been her primary caregivers at home.  Anticipated plan for discharge isskilled nursing facility (SNF- in process)for rehabilitationper therapy recommendation.  Patientvoiced understandingto callprimarycareprovider'soffice once shereturnshome,for a post discharge follow-upvisitwithin1- 2 weeks or sooner if needs arise.Patient letter (with PCP's contact number) was provided asareminder.  Explained topatient about[facility] CM services available for health management andresourcesat homebutshe indicated that her children have been capable, supportive and helpful in assisting her to manage health needs at home.Patient denies any pressing issues or concerns regarding health conditions for now. Patient was encouraged to discuss with primary care provider on her next visit, about further needsand assistancein managing her health conditions onceshegetsback home.  Patientexpressedunderstandingto seekreferral from primary care provider to [facility] care management ifdeemed necessary  and appropriate for any servicesin thenearfuture.  [facility] care management information was provided for futureneeds that shemay have.  Primary care provider's office is listed as providing transition of care (TOC) follow-up.    For additional questions please contact:  Edwena Felty A. Avey Mcmanamon, BSN, RN-BC [facility] PRIMARY CARE Navigator Cell: [phone]

## 2018-08-25 ENCOUNTER — Encounter (HOSPITAL_COMMUNITY): Payer: Self-pay | Admitting: Cardiology

## 2018-08-25 ENCOUNTER — Telehealth: Payer: Self-pay | Admitting: Cardiology

## 2018-08-25 DIAGNOSIS — I482 Chronic atrial fibrillation, unspecified: Secondary | ICD-10-CM

## 2018-08-25 LAB — GLUCOSE, CAPILLARY
GLUCOSE-CAPILLARY: 145 mg/dL — AB (ref 70–99)
GLUCOSE-CAPILLARY: 212 mg/dL — AB (ref 70–99)
Glucose-Capillary: 144 mg/dL — ABNORMAL HIGH (ref 70–99)
Glucose-Capillary: 190 mg/dL — ABNORMAL HIGH (ref 70–99)

## 2018-08-25 MED ORDER — ESCITALOPRAM OXALATE 10 MG PO TABS: 10.0000 mg | ORAL_TABLET | Freq: Every day | ORAL | 0 refills | Status: DC

## 2018-08-25 MED ORDER — LEVOTHYROXINE SODIUM 112 MCG PO TABS: 56.0000 ug | ORAL_TABLET | Freq: Every day | ORAL | Status: AC

## 2018-08-25 MED ORDER — MAGNESIUM SULFATE 2 GM/50ML IV SOLN
2.0000 g | Freq: Once | INTRAVENOUS | Status: AC
  Administered 2018-08-25: 2 g via INTRAVENOUS
  Filled 2018-08-25: qty 50

## 2018-08-25 MED ORDER — METOPROLOL SUCCINATE ER 25 MG PO TB24: 25.0000 mg | ORAL_TABLET | Freq: Every day | ORAL | Status: AC

## 2018-08-25 NOTE — Progress Notes (Signed)
Pt had an unwitnessed fall trying to transfer from [facility] to bed. Pt stated "I did not fall I just sat."  Pt stated she did not hit her and did not c/o of pain. Asked pt if she wanted me to call her family and she refused. MD notified. No further tests ordered. Will continue to monitor.  Tressie Ellis, RN

## 2018-08-25 NOTE — Consult Note (Signed)
CARDIOLOGY CONSULT NOTE  Patient ID: Lindsay Nguyen MRN: [id number] DOB/AGE: [birth year] 83 y.o.  Admit date: 08/19/2018 Primary Physician [name], MD Primary Cardiologist  [name], MD Chief Complaint   Fatiuge Requesting  Dr. [name]  HPI:   The patient was admitted with weakness and fever.  She has a history of CAD/CABG.  Aortic enlargement with grafting, diastolic HF and persistent atrial fib DCCV x 2 in 2017. She has also had sinus bradycardia.  She was in the hospital earlier this month with weakness.  She had atrial fib with a slow rate and her beta blocker was held.  However, she subsequently had increased HR into the 80s and the beta blocker was restarted.  She was seen in follow up in our office and it was decided to reduce the Toprol from 50 mg to 25 mg.    Labs were drawn including electrolytes and TSH and these were normal.  She has been wearing a ZIO patch. We are asked to comment on the plan for this device and further follow up of her atrial fib.    she was febrile on admission and was treated with antibiotics.  However, there has been no clear source to her fever.  Antibiotics were subsequently stopped.  She had low potassium and magnesium supplemented.  She is going to transition to skilled rehab before home.    We are consulted to comment on what to do with the ZIO patch and any other suggestions about her rhythm.  The patient denies any ongoing cardiac symptoms.  The patient denies any new symptoms such as chest discomfort, neck or arm discomfort. There has been no new shortness of breath, PND or orthopnea. There have been no reported palpitations, presyncope or syncope.  She has been weak but never feels her fib.   Unfortunately, she is off of tele and I cannot retrieve any recordings this admission    Past Medical History:  Diagnosis Date  . Anemia   . Atrial fibrillation ([place])    persistent; on Eliquis  . CAD (coronary artery disease)    2D ECHO,  11/04/2010 - EF >55%, mild-moderate mitral regurgitation, moderate tricuspid regurgitation  . Dementia ([place])   . Diabetes mellitus   . GERD (gastroesophageal reflux disease)   . Hyperlipidemia   . Hypertension   . Osteopenia   . Stroke ([place])   . Thoracic aortic aneurysm (HCC)    status post resection and grafting  . Thyroid disease    hypothyroid    Past Surgical History:  Procedure Laterality Date  . CARDIAC CATHETERIZATION  06/18/2010   Recommended CABG  . CARDIOVERSION N/A 09/30/2015   Procedure: CARDIOVERSION;  Surgeon: [name], MD;  Location: [facility] ENDOSCOPY;  Service: Cardiovascular;  Laterality: N/A;  . CARDIOVERSION N/A 06/05/2016   Procedure: CARDIOVERSION;  Surgeon: [name], MD;  Location: [facility] ENDOSCOPY;  Service: Cardiovascular;  Laterality: N/A;  . CHOLECYSTECTOMY    . CORONARY ARTERY BYPASS GRAFT  06/18/2010   LIMA-LAD, vein-diagonal branch, vein-obtuse marginal branch, and resectioning and grafting of throacic aortic aneurysm  . ELBOW SURGERY  1979   fracture left  . THORACIC AORTIC ANEURYSM REPAIR    . TONSILLECTOMY  1945  . TUBAL LIGATION  1971    Allergies  Allergen Reactions  . Contrast Media [Iodinated Diagnostic Agents] Anaphylaxis and Swelling  . Iodine Anaphylaxis and Swelling  . Shellfish Allergy Anaphylaxis    unknown  . Gabapentin Hives   Medications Prior to Admission  Medication Sig Dispense Refill Last Dose  . albuterol (PROVENTIL) (2.5 MG/3ML) 0.083% nebulizer solution Take 2.5 mg by nebulization every 2 (two) hours as needed for wheezing or shortness of breath.   5 months  . amLODipine (NORVASC) 2.5 MG tablet TAKE 1 TABLET(2.5 MG) BY MOUTH DAILY (Patient taking differently: Take 2.5 mg by mouth daily. ) 90 tablet 3 08/18/2018 at Unknown time  . aspirin EC 81 MG tablet Take 1 tablet (81 mg total) by mouth daily. 90 tablet 3 08/18/2018 at 1600  . atorvastatin (LIPITOR) 40 MG tablet Take 1 tablet (40 mg total) by mouth daily at 6 PM. 90 tablet 2  08/18/2018 at Unknown time  . budesonide (PULMICORT) 0.25 MG/2ML nebulizer solution Take 0.25 mg by nebulization daily as needed (SOB).    months  . donepezil (ARICEPT) 10 MG tablet TAKE 1 TABLET(10 MG) BY MOUTH AT BEDTIME (Patient taking differently: Take 10 mg by mouth every evening. ) 90 tablet 3 08/18/2018 at Unknown time  . empagliflozin (JARDIANCE) 25 MG TABS tablet Take 12.5 mg by mouth daily. 30 tablet 3 08/18/2018 at Unknown time  . furosemide (LASIX) 40 MG tablet TAKE 1 TABLET BY MOUTH DAILY AS NEEDED FOR FLUID RETENTION (Patient taking differently: Take 40 mg by mouth daily as needed for fluid. ) 90 tablet 1 months  . JANUVIA 100 MG tablet TAKE 1 TABLET BY MOUTH DAILY (Patient taking differently: Take 100 mg by mouth daily. ) 90 tablet 0 08/18/2018 at Unknown time  . loratadine (CLARITIN) 10 MG tablet Take 10 mg by mouth daily.    08/18/2018 at Unknown time  . losartan (COZAAR) 50 MG tablet TAKE 1 TABLET BY MOUTH DAILY(DISCONTINUE VALSARTAN) (Patient taking differently: Take 50 mg by mouth daily. ) 90 tablet 1 08/18/2018 at Unknown time  . memantine (NAMENDA) 10 MG tablet TAKE 1 TABLET(10 MG) BY MOUTH TWICE DAILY (Patient taking differently: Take 10 mg by mouth 2 (two) times daily. ) 60 tablet 0 08/18/2018 at Unknown time  . metoCLOPramide (REGLAN) 5 MG tablet TAKE 1 TABLET(5 MG) BY MOUTH FOUR TIMES DAILY BEFORE MEALS AND AT BEDTIME (Patient taking differently: Take 5 mg by mouth 3 (three) times daily. ) 120 tablet 3 08/18/2018 at Unknown time  . metoprolol succinate (TOPROL-XL) 50 MG 24 hr tablet Take 1 tablet (50 mg total) by mouth daily. (Patient taking differently: Take 25 mg by mouth daily. ) 90 tablet 3 08/18/2018 at 1600  . nitroGLYCERIN (NITROSTAT) 0.4 MG SL tablet Place 1 tablet (0.4 mg total) under the tongue every 5 (five) minutes as needed for chest pain (MAX 3 TABLETS). 25 tablet 4 unk  . SYNTHROID 112 MCG tablet TAKE 1/2 TABLET BY MOUTH DAILY BEFORE BREAKFAST (Patient taking differently:  Take 56 mcg by mouth daily before breakfast. ) 90 tablet 0 08/18/2018 at Unknown time  . XARELTO 20 MG TABS tablet TAKE 1 TABLET(20 MG) BY MOUTH DAILY WITH SUPPER (Patient taking differently: Take 20 mg by mouth daily. ) 90 tablet 3 08/18/2018 at 1600  . [DISCONTINUED] escitalopram (LEXAPRO) 10 MG tablet TAKE 1 TABLET(10 MG) BY MOUTH DAILY (Patient taking differently: Take 10 mg by mouth daily. ) 90 tablet 3 08/18/2018 at Unknown time   Family History  Problem Relation Age of Onset  . Hypertension Mother   . Osteoporosis Mother   . Heart disease Father   . Stroke Father   . Alzheimer's disease Father   . Heart attack Father     Social History   Socioeconomic  History  . Marital status: Married    Spouse name: Not on file  . Number of children: Not on file  . Years of education: Not on file  . Highest education level: Not on file  Occupational History  . Not on file  Social Needs  . Financial resource strain: Not on file  . Food insecurity:    Worry: Not on file    Inability: Not on file  . Transportation needs:    Medical: Not on file    Non-medical: Not on file  Tobacco Use  . Smoking status: Never Smoker  . Smokeless tobacco: Never Used  Substance and Sexual Activity  . Alcohol use: No  . Drug use: No  . Sexual activity: Not on file  Lifestyle  . Physical activity:    Days per week: Not on file    Minutes per session: Not on file  . Stress: Not on file  Relationships  . Social connections:    Talks on phone: Not on file    Gets together: Not on file    Attends religious service: Not on file    Active member of club or organization: Not on file    Attends meetings of clubs or organizations: Not on file    Relationship status: Not on file  . Intimate partner violence:    Fear of current or ex partner: Not on file    Emotionally abused: Not on file    Physically abused: Not on file    Forced sexual activity: Not on file  Other Topics Concern  . Not on file  Social  History Narrative   Patient's daughter is a Designer, jewellery who does hospitalist work at [employer].  Her son is a Marine scientist     ROS:    As stated in the HPI and negative for all other systems.  Physical Exam: Blood pressure (!) 179/87, pulse 67, temperature 98.3 F (36.8 C), temperature source Oral, resp. rate 18, height 5\' 9"  (1.753 m), weight 87 kg, SpO2 98 %.  GENERAL:  Well appearing NECK:  No jugular venous distention, waveform within normal limits, carotid upstroke brisk and symmetric, no bruits, no thyromegaly LYMPHATICS:  No cervical adenopathy LUNGS:  Clear to auscultation bilaterally BACK:  No CVA tenderness CHEST:  Unremarkable HEART:  PMI not displaced or sustained,S1 and S2 within normal limits, no S3,  no clicks, no rubs, no murmurs, irregular ABD:  Flat, positive bowel sounds normal in frequency in pitch, no bruits, no rebound, no guarding, no midline pulsatile mass, no hepatomegaly, no splenomegaly EXT:  2 plus pulses throughout, mild bilateral leg edema, no cyanosis no clubbing SKIN:  No rashes no nodules NEURO:  Cranial nerves II through XII grossly intact, motor grossly intact throughout PSYCH:  Cognitively intact, oriented to person place and time   Labs: Lab Results  Component Value Date   BUN 11 08/24/2018   Lab Results  Component Value Date   CREATININE 0.78 08/24/2018   Lab Results  Component Value Date   NA 138 08/24/2018   K 3.4 (L) 08/24/2018   CL 102 08/24/2018   CO2 25 08/24/2018   Lab Results  Component Value Date   TROPONINI 0.03        NO INDICATION OF MYOCARDIAL INJURY. 06/18/2010   Lab Results  Component Value Date   WBC 16.3 (H) 08/20/2018   HGB 12.6 08/20/2018   HCT 36.9 08/20/2018   MCV 98.1 08/20/2018   PLT 147 (L) 08/20/2018  Lab Results  Component Value Date   CHOL 127 11/26/2017   HDL 32 (L) 11/26/2017   LDLCALC 70 11/26/2017   LDLDIRECT 103.6 08/21/2009   TRIG 186 (H) 11/26/2017   CHOLHDL 4.0 11/26/2017     Lab Results  Component Value Date   ALT 14 08/19/2018   AST 19 08/19/2018   ALKPHOS 67 08/19/2018   BILITOT 0.8 08/19/2018      Radiology:   CXR: Chronic cardiomegaly and vascular pedicle widening. Prior CABG. Stable interstitial coarsening when compared to recent prior and 2017. There is no edema, consolidation, effusion, or pneumothorax. No acute osseous finding.   EKG:    Atrial fib, rate 57, IVCD, LAD, LVH, inferior and lateral T wave inversion consider ischemia.  No change from previous.  08/18/17  ASSESSMENT AND PLAN:   CAD:  The patient has no new sypmtoms.  No further cardiovascular testing is indicated.  We will continue with aggressive risk reduction and meds as listed.  ATRIAL FIB:  She has been maintained on her beta blocker and her Xarelto.  I suspect that the rate is OK but cannot review the tele as she has been taken off.  She has no complaints that would localize to a bradycardic rhythm.  She tolerates the anticoagulation.  I have told her to complete a total of two weeks of wearing the ZIO patch and to mail it back in.  There is no [place] to interrogate this device remotely and no expedient [place] to send it back in from the hospital.  The patient/family has a box in which to mail back the device and it will be down loaded and we will get back to her with results.  Otherwise continue meds as listed.   HTN:  BP is elevated.      CHRONIC DIASTOLIC HF:  She seems to be euvolemic.  I would suggest increasing the Norvasc to 5 mg daily.  Follow BPs in rehab and home.    DM:  A1C is 8.9.  Meds adjusted per primary team.    Signed: Minus Breeding 08/25/2018, 6:03 PM

## 2018-08-25 NOTE — Clinical Social Work Note (Addendum)
Left voicemail for [facility] admissions coordinator to see if there were any updates on decision.  Dayton Scrape, [place] [phone]  12:48 pm Tried calling [facility] coordinator again. Did not leave second voicemail.  Dayton Scrape, CSW [phone]  1:38 pm Left another voicemail at [facility]. No call backs yet. Will fax them discharge summary in case they can take her. [facility] is full. Left voicemail for daughter.  Dayton Scrape, [place]  2:43 pm Received voicemail from daughter stating that she liked [facility] and they were going to extend a bed offer. Still no response from admissions coordinator. Tried calling again but got voicemail. Daughter said she had requested that a cardiologist see patient a couple of days ago. Sent message to MD to notify.  Dayton Scrape, [place]  3:19 pm Daughter had requested a cardiology consult a couple of days ago. She said she has a zio monitor and was supposed to get it off today. She's concerned that she was brady and they are considering her for a pacemaker and no cardiologist has seen her. SNF said they could keep her midline in for electrolyte management if needed. Daughter also wants her magnesium and potassium checked vs. giving her more to maintain. Sent message to MD to notify.  Dayton Scrape, [place]  3:59 pm [facility] will have a bed for patient tomorrow. Daughter aware.  Dayton Scrape, [place]

## 2018-08-25 NOTE — Telephone Encounter (Signed)
New Message:      Pt was admitted to the hospital on last Friday. Her question is, should pt continue to wear her monitor?

## 2018-08-25 NOTE — Discharge Summary (Signed)
Physician Discharge Summary  [place] SKA:[id number] DOB: 01/02/1936 DOA: 08/19/2018  PCP: [name], MD  Admit date: 08/19/2018 Discharge date: 08/25/2018  Time spent: 25 minutes  Recommendations for Outpatient Follow-up:  1. Needs outpatient CBC and Chem-12 as well as magnesium in about 1 week 2. Recheck weights and fluid status periodically 3. Recheck TSH in 1 month  Discharge Diagnoses:  Principal Problem:   Fever Active Problems:   Essential hypertension   Hx of CABG   PAF (paroxysmal atrial fibrillation) (HCC)   Dementia (HCC)   Weakness   Chronic diastolic CHF (congestive heart failure) (HCC)   Sepsis ([place])   Discharge Condition: Improved  Diet recommendation: Diabetic heart healthy  Filed Weights   08/19/18 0256  Weight: 87 kg    History of present illness:  83 year old female history CAD CABG, type 2 diabetes mellitus, atrial fibrillation chads score >2Admitted 08/19/2018 feeling unwell and had a temperature above 101 Work-up eventually revealed no specific source although was covered with Vanco and cefepime During hospital stay she had this electrolyte anemia with low potassium low magnesium Her glycemic control was relatively well covered with basal Lantus although she is on p.o.'s Heart rate control well maintained on metoprolol and she continued her Xarelto It was felt on evaluation by therapy services that she would benefit from skilled rehab to maximize her potential and she has remained stable in the hospital so we elected to discharge her on 1/23 as she had maximally benefited from hospital admission   Discharge Exam: Vitals:   08/25/18 0800 08/25/18 1107  BP: (!) 168/85 (!) 153/113  Pulse: 67   Resp: 18 18  Temp: 98.1 F (36.7 C) 98 F (36.7 C)  SpO2: 98%     General: Awake alert pleasant no distress Cardiovascular: S1-S2 no murmur rub or gallop Respiratory: Clinically clear no added sound no rales no rhonchi Abdomen is soft  nontender no rebound Neurologically intact  Discharge Instructions   Discharge Instructions    Diet - low sodium heart healthy   Complete by:  As directed    Increase activity slowly   Complete by:  As directed      Allergies as of 08/25/2018      Reactions   Contrast Media [iodinated Diagnostic Agents] Anaphylaxis, Swelling   Iodine Anaphylaxis, Swelling   Shellfish Allergy Anaphylaxis   unknown   Gabapentin Hives      Medication List    STOP taking these medications   furosemide 40 MG tablet Commonly known as:  LASIX     TAKE these medications   albuterol (2.5 MG/3ML) 0.083% nebulizer solution Commonly known as:  PROVENTIL Take 2.5 mg by nebulization every 2 (two) hours as needed for wheezing or shortness of breath.   amLODipine 2.5 MG tablet Commonly known as:  NORVASC TAKE 1 TABLET(2.5 MG) BY MOUTH DAILY What changed:  See the new instructions.   aspirin EC 81 MG tablet Take 1 tablet (81 mg total) by mouth daily.   atorvastatin 40 MG tablet Commonly known as:  LIPITOR Take 1 tablet (40 mg total) by mouth daily at 6 PM.   budesonide 0.25 MG/2ML nebulizer solution Commonly known as:  PULMICORT Take 0.25 mg by nebulization daily as needed (SOB).   donepezil 10 MG tablet Commonly known as:  ARICEPT TAKE 1 TABLET(10 MG) BY MOUTH AT BEDTIME What changed:  See the new instructions.   empagliflozin 25 MG Tabs tablet Commonly known as:  JARDIANCE Take 12.5 mg by mouth  daily.   escitalopram 10 MG tablet Commonly known as:  LEXAPRO Take 1 tablet (10 mg total) by mouth daily. What changed:  See the new instructions.   JANUVIA 100 MG tablet Generic drug:  sitaGLIPtin TAKE 1 TABLET BY MOUTH DAILY What changed:  how much to take   loratadine 10 MG tablet Commonly known as:  CLARITIN Take 10 mg by mouth daily.   losartan 50 MG tablet Commonly known as:  COZAAR TAKE 1 TABLET BY MOUTH DAILY(DISCONTINUE VALSARTAN) What changed:  See the new instructions.    memantine 10 MG tablet Commonly known as:  NAMENDA TAKE 1 TABLET(10 MG) BY MOUTH TWICE DAILY What changed:  See the new instructions.   metoCLOPramide 5 MG tablet Commonly known as:  REGLAN TAKE 1 TABLET(5 MG) BY MOUTH FOUR TIMES DAILY BEFORE MEALS AND AT BEDTIME What changed:  See the new instructions.   metoprolol succinate 25 MG 24 hr tablet Commonly known as:  TOPROL-XL Take 1 tablet (25 mg total) by mouth daily. Start taking on:  August 26, 2018 What changed:    medication strength  how much to take   nitroGLYCERIN 0.4 MG SL tablet Commonly known as:  NITROSTAT Place 1 tablet (0.4 mg total) under the tongue every 5 (five) minutes as needed for chest pain (MAX 3 TABLETS).   SYNTHROID 112 MCG tablet Generic drug:  levothyroxine TAKE 1/2 TABLET BY MOUTH DAILY BEFORE BREAKFAST What changed:  See the new instructions.   levothyroxine 112 MCG tablet Commonly known as:  SYNTHROID Take 0.5 tablets (56 mcg total) by mouth daily before breakfast. Start taking on:  August 26, 2018 What changed:  You were already taking a medication with the same name, and this prescription was added. Make sure you understand how and when to take each.   XARELTO 20 MG Tabs tablet Generic drug:  rivaroxaban TAKE 1 TABLET(20 MG) BY MOUTH DAILY WITH SUPPER What changed:  See the new instructions.      Allergies  Allergen Reactions  . Contrast Media [Iodinated Diagnostic Agents] Anaphylaxis and Swelling  . Iodine Anaphylaxis and Swelling  . Shellfish Allergy Anaphylaxis    unknown  . Gabapentin Hives      The results of significant diagnostics from this hospitalization (including imaging, microbiology, ancillary and laboratory) are listed below for reference.    Significant Diagnostic Studies: Dg Chest 2 View  Result Date: 08/19/2018 CLINICAL DATA:  Fever EXAM: CHEST - 2 VIEW COMPARISON:  08/15/2018 FINDINGS: Chronic cardiomegaly and vascular pedicle widening. Prior CABG. Stable  interstitial coarsening when compared to recent prior and 2017. There is no edema, consolidation, effusion, or pneumothorax. No acute osseous finding. IMPRESSION: Stable compared to priors.  No acute finding. Electronically Signed   By: Monte Fantasia M.D.   On: 08/19/2018 04:45   Dg Chest 2 View  Result Date: 08/15/2018 CLINICAL DATA:  83 year old female with weakness and diaphoresis. EXAM: CHEST - 2 VIEW COMPARISON:  05/26/2016 chest radiographs and earlier. FINDINGS: Semi upright AP and lateral views of the chest. Stable cardiomegaly and mediastinal contours. Prior sternotomy, CABG. Lung volumes are stable and within normal limits. No pneumothorax, pulmonary edema, pleural effusion or confluent pulmonary opacity. No acute osseous abnormality identified. Negative visible bowel gas pattern. IMPRESSION: Stable cardiomegaly. No acute cardiopulmonary abnormality. Electronically Signed   By: Genevie Ann M.D.   On: 08/15/2018 19:10   Ct Head Wo Contrast  Result Date: 08/15/2018 CLINICAL DATA:  Dizziness and lightheadedness EXAM: CT HEAD WITHOUT CONTRAST TECHNIQUE: Contiguous  axial images were obtained from the base of the skull through the vertex without intravenous contrast. COMPARISON:  Head CT 09/29/2017 FINDINGS: Brain: There is no mass, hemorrhage or extra-axial collection. Mild generalized atrophy. 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 There is chronic microangiopathic white matter hypoattenuation. No acute parenchymal abnormality. Vascular: No abnormal hyperdensity of the major intracranial arteries or dural venous sinuses. No intracranial atherosclerosis. Skull: The visualized skull base, calvarium and extracranial soft tissues are normal. Sinuses/Orbits: No fluid levels or advanced mucosal thickening of the visualized paranasal sinuses. No mastoid or middle ear effusion. The orbits are normal. IMPRESSION: Atrophy and chronic small vessel disease without acute intracranial abnormality. Electronically  Signed   By: Ulyses Jarred M.D.   On: 08/15/2018 19:40    Microbiology: Recent Results (from the past 240 hour(s))  Urine culture     Status: None   Collection Time: 08/15/18  8:11 PM  Result Value Ref Range Status   Specimen Description URINE, CLEAN CATCH  Final   Special Requests NONE  Final   Culture   Final    NO GROWTH Performed at [facility], [address], [place], [place] [address]    Report Status 08/17/2018 FINAL  Final  Blood Culture (routine x 2)     Status: None   Collection Time: 08/19/18  3:04 AM  Result Value Ref Range Status   Specimen Description BLOOD RIGHT ANTECUBITAL  Final   Special Requests   Final    BOTTLES DRAWN AEROBIC AND ANAEROBIC Blood Culture results may not be optimal due to an inadequate volume of blood received in culture bottles   Culture   Final    NO GROWTH 5 DAYS Performed at [facility], [place] [address], [place], [place] [address]    Report Status 08/24/2018 FINAL  Final  Blood Culture (routine x 2)     Status: None   Collection Time: 08/19/18  3:40 AM  Result Value Ref Range Status   Specimen Description BLOOD RIGHT FOREARM  Final   Special Requests   Final    BOTTLES DRAWN AEROBIC AND ANAEROBIC Blood Culture results may not be optimal due to an excessive volume of blood received in culture bottles   Culture   Final    NO GROWTH 5 DAYS Performed at [facility], [place] [address], [place], [place] [address]    Report Status 08/24/2018 FINAL  Final  MRSA PCR Screening     Status: None   Collection Time: 08/19/18  6:27 AM  Result Value Ref Range Status   MRSA by PCR NEGATIVE NEGATIVE Final    Comment:        The GeneXpert MRSA Assay (FDA approved for NASAL specimens only), is one component of a comprehensive MRSA colonization surveillance program. It is not intended to diagnose MRSA infection nor to guide or monitor treatment for MRSA infections. Performed at [facility], [place] [address],  [place], [place] [address]   Respiratory Panel by PCR     Status: None   Collection Time: 08/20/18  7:25 AM  Result Value Ref Range Status   Adenovirus NOT DETECTED NOT DETECTED Final   Coronavirus 229E NOT DETECTED NOT DETECTED Final   Coronavirus HKU1 NOT DETECTED NOT DETECTED Final   Coronavirus NL63 NOT DETECTED NOT DETECTED Final   Coronavirus OC43 NOT DETECTED NOT DETECTED Final   Metapneumovirus NOT DETECTED NOT DETECTED Final   Rhinovirus / Enterovirus NOT DETECTED NOT DETECTED Final  Influenza A NOT DETECTED NOT DETECTED Final   Influenza B NOT DETECTED NOT DETECTED Final   Parainfluenza Virus 1 NOT DETECTED NOT DETECTED Final   Parainfluenza Virus 2 NOT DETECTED NOT DETECTED Final   Parainfluenza Virus 3 NOT DETECTED NOT DETECTED Final   Parainfluenza Virus 4 NOT DETECTED NOT DETECTED Final   Respiratory Syncytial Virus NOT DETECTED NOT DETECTED Final   Bordetella pertussis NOT DETECTED NOT DETECTED Final   Chlamydophila pneumoniae NOT DETECTED NOT DETECTED Final   Mycoplasma pneumoniae NOT DETECTED NOT DETECTED Final    Comment: Performed at [facility], [place] [address] [place],  [address]     Labs: Basic Metabolic Panel: Recent Labs  Lab 08/20/18 0241 08/21/18 0736 08/22/18 0219 08/23/18 0243 08/24/18 0347  NA 137 134* 137 136 138  K 3.4* 3.5 3.0* 3.3* 3.4*  CL 106 104 104 102 102  CO2 21* 20* 23 24 25   GLUCOSE 186* 209* 161* 164* 146*  BUN 13 9 11 11 11   CREATININE 0.86 0.71 0.74 0.68 0.78  CALCIUM 8.3* 7.9* 8.1* 8.2* 8.8*  MG  --   --   --  1.5* 1.8   Liver Function Tests: Recent Labs  Lab 08/19/18 0304  AST 19  ALT 14  ALKPHOS 67  BILITOT 0.8  PROT 7.4  ALBUMIN 3.5   No results for input(s): LIPASE, AMYLASE in the last 168 hours. No results for input(s): AMMONIA in the last 168 hours. CBC: Recent Labs  Lab 08/19/18 0304 08/20/18 0241  WBC 19.4* 16.3*  NEUTROABS 16.6*  --   HGB 14.5 12.6  HCT 42.9 36.9  MCV 96.2 98.1  PLT 193  147*   Cardiac Enzymes: No results for input(s): CKTOTAL, CKMB, CKMBINDEX, TROPONINI in the last 168 hours. BNP: BNP (last 3 results) No results for input(s): BNP in the last 8760 hours.  ProBNP (last 3 results) No results for input(s): PROBNP in the last 8760 hours.  CBG: Recent Labs  Lab 08/24/18 1119 08/24/18 1301 08/24/18 1559 08/24/18 2123 08/25/18 0758  GLUCAP 157* 133* 234* 190* 144*       Signed:  Nita Sells MD   [facility] 08/25/2018, 1:14 PM

## 2018-08-25 NOTE — Telephone Encounter (Signed)
Daughter wanted to know if  holter monitor that the patient is wearing shoudl be extended or  Completed today and returned to company. Patient has been wearing the device while she has been in the hospital 08/19/18 til today 08/25/18. patient [place].  Per daughter  Cardiology has not been consulted and she had requested  social worker to discuss with hospitalIST.  Daughter states she wanted cardiology opinion before patient leaves the [place] will  discuss to [facility]

## 2018-08-25 NOTE — Telephone Encounter (Signed)
Reviewed with luke,  Please contact service to do consult and address question about  Monitor. RN CALLED  AN GAVE INFORMATION TO  CARDMASTER- TRISH  ABOUT   CONSULT AND REQUEST .   NOTIFIED DAUGHTER  NO ANSWER LEFT MESSAGE VIA SECURE VOICEMAIL PER DAUGHTER REQUEST

## 2018-08-25 NOTE — Plan of Care (Signed)

## 2018-08-25 NOTE — Care Management Important Message (Signed)
Important Message  Patient Details  Name: YETUNDE LEIS MRN: [id number] Date of Birth: [birth year]   Medicare Important Message Given:  Yes    Arzella Rehmann P [place] 08/25/2018, 11:09 AM

## 2018-08-26 DIAGNOSIS — I482 Chronic atrial fibrillation, unspecified: Secondary | ICD-10-CM | POA: Diagnosis not present

## 2018-08-26 DIAGNOSIS — Z951 Presence of aortocoronary bypass graft: Secondary | ICD-10-CM

## 2018-08-26 DIAGNOSIS — N3080 Other cystitis without hematuria: Secondary | ICD-10-CM | POA: Diagnosis not present

## 2018-08-26 DIAGNOSIS — E039 Hypothyroidism, unspecified: Secondary | ICD-10-CM | POA: Diagnosis not present

## 2018-08-26 DIAGNOSIS — F0280 Dementia in other diseases classified elsewhere without behavioral disturbance: Secondary | ICD-10-CM | POA: Diagnosis not present

## 2018-08-26 DIAGNOSIS — R509 Fever, unspecified: Secondary | ICD-10-CM | POA: Diagnosis not present

## 2018-08-26 DIAGNOSIS — I480 Paroxysmal atrial fibrillation: Secondary | ICD-10-CM | POA: Diagnosis not present

## 2018-08-26 DIAGNOSIS — Z7901 Long term (current) use of anticoagulants: Secondary | ICD-10-CM | POA: Diagnosis not present

## 2018-08-26 DIAGNOSIS — E876 Hypokalemia: Secondary | ICD-10-CM | POA: Diagnosis not present

## 2018-08-26 DIAGNOSIS — M2550 Pain in unspecified joint: Secondary | ICD-10-CM | POA: Diagnosis not present

## 2018-08-26 DIAGNOSIS — R5381 Other malaise: Secondary | ICD-10-CM | POA: Diagnosis not present

## 2018-08-26 DIAGNOSIS — D72828 Other elevated white blood cell count: Secondary | ICD-10-CM | POA: Diagnosis not present

## 2018-08-26 DIAGNOSIS — F0390 Unspecified dementia without behavioral disturbance: Secondary | ICD-10-CM | POA: Diagnosis not present

## 2018-08-26 DIAGNOSIS — R531 Weakness: Secondary | ICD-10-CM | POA: Diagnosis not present

## 2018-08-26 DIAGNOSIS — I10 Essential (primary) hypertension: Secondary | ICD-10-CM | POA: Diagnosis not present

## 2018-08-26 DIAGNOSIS — N390 Urinary tract infection, site not specified: Secondary | ICD-10-CM | POA: Diagnosis not present

## 2018-08-26 DIAGNOSIS — B348 Other viral infections of unspecified site: Secondary | ICD-10-CM | POA: Diagnosis not present

## 2018-08-26 DIAGNOSIS — Z7401 Bed confinement status: Secondary | ICD-10-CM | POA: Diagnosis not present

## 2018-08-26 DIAGNOSIS — B349 Viral infection, unspecified: Secondary | ICD-10-CM | POA: Diagnosis not present

## 2018-08-26 DIAGNOSIS — I110 Hypertensive heart disease with heart failure: Secondary | ICD-10-CM | POA: Diagnosis not present

## 2018-08-26 DIAGNOSIS — I5032 Chronic diastolic (congestive) heart failure: Secondary | ICD-10-CM | POA: Diagnosis not present

## 2018-08-26 DIAGNOSIS — E1165 Type 2 diabetes mellitus with hyperglycemia: Secondary | ICD-10-CM | POA: Diagnosis not present

## 2018-08-26 LAB — MAGNESIUM: Magnesium: 1.8 mg/dL (ref 1.7–2.4)

## 2018-08-26 LAB — BASIC METABOLIC PANEL
Anion gap: 7 (ref 5–15)
BUN: 16 mg/dL (ref 8–23)
CO2: 25 mmol/L (ref 22–32)
Calcium: 8.7 mg/dL — ABNORMAL LOW (ref 8.9–10.3)
Chloride: 102 mmol/L (ref 98–111)
Creatinine, Ser: 0.79 mg/dL (ref 0.44–1.00)
GFR calc Af Amer: >60 mL/min (ref >60)
GLUCOSE: 250 mg/dL — AB (ref 70–99)
POTASSIUM: 3.4 mmol/L — AB (ref 3.5–5.1)
Sodium: 134 mmol/L — ABNORMAL LOW (ref 135–145)

## 2018-08-26 LAB — GLUCOSE, CAPILLARY
Glucose-Capillary: 175 mg/dL — ABNORMAL HIGH (ref 70–99)
Glucose-Capillary: 186 mg/dL — ABNORMAL HIGH (ref 70–99)

## 2018-08-26 MED ORDER — POTASSIUM CHLORIDE CRYS ER 20 MEQ PO TBCR
40.0000 meq | EXTENDED_RELEASE_TABLET | Freq: Once | ORAL | Status: AC
  Administered 2018-08-26: 40 meq via ORAL
  Filled 2018-08-26: qty 2

## 2018-08-26 MED ORDER — AMLODIPINE BESYLATE 5 MG PO TABS: 5.0000 mg | ORAL_TABLET | Freq: Every day | ORAL | Status: AC

## 2018-08-26 NOTE — Clinical Social Work Placement (Signed)
   CLINICAL SOCIAL WORK PLACEMENT  NOTE  Date:  08/26/2018  Patient Details  Name: Lindsay Nguyen MRN: [id number] Date of Birth: 1935/12/30  Clinical Social Work is seeking post-discharge placement for this patient at the [place] level of care (*CSW will initial, date and re-position this form in  chart as items are completed):      Patient/family provided with [facility] Work Department's list of facilities offering this level of care within the geographic area requested by the patient (or if unable, by the patient's family).      Patient/family informed of their freedom to choose among providers that offer the needed level of care, that participate in Medicare, Medicaid or managed care program needed by the patient, have an available bed and are willing to accept the patient.      Patient/family informed of [facility]'s ownership interest in [facility] and [facility], as well as of the fact that they are under no obligation to receive care at these facilities.  PASRR submitted to EDS on 08/23/18     PASRR number received on       Existing PASRR number confirmed on 08/23/18     FL2 transmitted to all facilities in geographic area requested by pt/family on 08/23/18     FL2 transmitted to all facilities within larger geographic area on       Patient informed that his/her managed care company has contracts with or will negotiate with certain facilities, including the following:        Yes   Patient/family informed of bed offers received.  Patient chooses bed at [facility])     Physician recommends and patient chooses bed at      Patient to be transferred to [facility]) on 08/26/18.  Patient to be transferred to facility by [facility]     Patient family notified on 08/26/18 of transfer.  Name of family member notified:  Damaris Hippo     PHYSICIAN Please prepare prescriptions     Additional Comment:     _______________________________________________ Candie Chroman, LCSW 08/26/2018, 10:53 AM

## 2018-08-26 NOTE — Clinical Social Work Note (Signed)
CSW facilitated patient discharge including contacting patient family and facility to confirm patient discharge plans. Clinical information faxed to facility and family agreeable with plan. CSW arranged ambulance transport via [facility] to [facility]. RN to call report prior to discharge [phone] Room [phone]).  CSW will sign off for now as social work intervention is no longer needed. Please consult [place] again if new needs arise.  Dayton Scrape, [place]

## 2018-08-26 NOTE — Discharge Summary (Addendum)
Physician Discharge Summary  [place] TIR:[id number] DOB: March 29, 1936 DOA: 08/19/2018  PCP: [name], MD  Admit date: 08/19/2018 Discharge date: 08/26/2018  Time spent: 45 minutes  Recommendations for Outpatient Follow-up:  Patient will be discharged to nursing facility, continue physical and occupational therapy.  Patient will need to follow up with primary care provider within one week of discharge repeat BMP and magnesium.  Patient should continue medications as prescribed.  Patient should follow a heart healthy/carb modified diet.   Patient has midline for access placement. Family has requested that this stay in place for discharge in the event that patient will need electrolyte replacement. This can be a source of infection, which was relayed to the family.   Discharge Diagnoses:  Principal problem: Febrile illness Essential hypertension Paroxysmal atrial fibrillation Chronic diastolic heart failure Hypokalemia/hypomagnesemia Diabetes mellitus, type II Hypothyroidism  Dementia Deconditioning  Discharge Condition: Stable  Diet recommendation: Heart healthy/carb modified  Filed Weights   08/19/18 0256  Weight: 87 kg    History of present illness:  On 08/19/2018 by Dr. Steward Ros Lindsay Nguyen is a 83 y.o. female with medical history significant of coronary artery disease, diabetes, [name] comes in with weakness today with fever and nausea.  Patient has not been feeling well for several days.  She denies any shortness of breath or cough.  She has been nauseous without vomiting.  She is denies any rashes on her body.  She denies any diarrhea.  She denies any abdominal pain or chest pain.  She denies any nasal congestion.  She just feels awful and weak.  Patient is being referred for admission for fever over 101 of unclear source.  Both her son and daughter present her daughter is a Designer, jewellery at [employer] and her son is a retired Marine scientist.  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  Hospital Course:  Febrile illness -Present on admission, however patient did rule out for sepsis -Was placed on vancomycin and cefepime however these were discontinued given that no source of infection was found -Respiratory viral panel and influenza PCR unremarkable -Blood cultures showed no growth to date -UA and chest x-ray reviewed, unremarkable for infection -Remains afebrile with no leukocytosis  Essential hypertension -Continue metoprolol, losartan, amlodipine -BP slightly uncontrolled, however cardiology did recommend increasing amlodipine to 5 mg daily  Paroxysmal atrial fibrillation -Continue Xarelto for anticoagulation -Rate and rhythm controlled, continue metoprolol -Cardiology consulted and appreciated, recommending that family or patient to mail in box for interrogation of patient's device  Chronic diastolic heart failure -Patient does not appear to be volume overloaded.  Currently euvolemic and compensated -Echocardiogram 02/21/2018 showed an EF of 6065%.  Study was technically insufficient to allow LV diastolic function evaluation -Cardiology suggested increasing Norvasc to 5 mg daily  Hypokalemia/hypomagnesemia -Potassium replaced, repeat BMP in 1 week -Medium 1.8, recommend repeating in 1 week  Diabetes mellitus, type II -Hemoglobin A1c 8.9 -Continue home medications on discharge, Jardiance, Januvia  Hypothyroidism  -Continue Synthroid  Dementia -Continue Namenda and Aricept  Deconditioning -In the presence of febrile illness.  Patient does appear to be very weak and requiring rehab.  PT consulted recommending SNF.  Procedures: None  Consultations: Cardiology  Discharge Exam: Vitals:   08/26/18 0314 08/26/18 0957  BP: (!) 167/98 (!) 163/98  Pulse:  71  Resp:    Temp:    SpO2:  96%     General: Well developed, early, NAD  HEENT: NCAT, mucous membranes moist.  Neck: Supple  Cardiovascular: S1 S2 auscultated, no  rubs, murmurs or gallops. Regular rate and rhythm.  Respiratory: Clear to auscultation bilaterally with equal chest rise  Abdomen: Soft, nontender, nondistended, + bowel sounds  Extremities: warm dry without cyanosis clubbing or edema  Neuro: AAOx3, nonfocal  Psych: Appropriate mood and affect  Discharge Instructions Discharge Instructions    Diet - low sodium heart healthy   Complete by:  As directed    Discharge instructions   Complete by:  As directed    Patient will be discharged to nursing facility, continue physical and occupational therapy.  Patient will need to follow up with primary care provider within one week of discharge repeat BMP and magnesium.  Patient should continue medications as prescribed.  Patient should follow a heart healthy/carb modified diet.   Increase activity slowly   Complete by:  As directed      Allergies as of 08/26/2018      Reactions   Contrast Media [iodinated Diagnostic Agents] Anaphylaxis, Swelling   Iodine Anaphylaxis, Swelling   Shellfish Allergy Anaphylaxis   unknown   Gabapentin Hives      Medication List    STOP taking these medications   furosemide 40 MG tablet Commonly known as:  LASIX     TAKE these medications   albuterol (2.5 MG/3ML) 0.083% nebulizer solution Commonly known as:  PROVENTIL Take 2.5 mg by nebulization every 2 (two) hours as needed for wheezing or shortness of breath.   amLODipine 5 MG tablet Commonly known as:  NORVASC Take 1 tablet (5 mg total) by mouth daily. What changed:    medication strength  See the new instructions.   aspirin EC 81 MG tablet Take 1 tablet (81 mg total) by mouth daily.   atorvastatin 40 MG tablet Commonly known as:  LIPITOR Take 1 tablet (40 mg total) by mouth daily at 6 PM.   budesonide 0.25 MG/2ML nebulizer solution Commonly known as:  PULMICORT Take 0.25 mg by nebulization daily as needed (SOB).   donepezil 10 MG tablet Commonly known as:  ARICEPT TAKE 1 TABLET(10  MG) BY MOUTH AT BEDTIME What changed:  See the new instructions.   empagliflozin 25 MG Tabs tablet Commonly known as:  JARDIANCE Take 12.5 mg by mouth daily.   escitalopram 10 MG tablet Commonly known as:  LEXAPRO Take 1 tablet (10 mg total) by mouth daily. What changed:  See the new instructions.   JANUVIA 100 MG tablet Generic drug:  sitaGLIPtin TAKE 1 TABLET BY MOUTH DAILY What changed:  how much to take   loratadine 10 MG tablet Commonly known as:  CLARITIN Take 10 mg by mouth daily.   losartan 50 MG tablet Commonly known as:  COZAAR TAKE 1 TABLET BY MOUTH DAILY(DISCONTINUE VALSARTAN) What changed:  See the new instructions.   memantine 10 MG tablet Commonly known as:  NAMENDA TAKE 1 TABLET(10 MG) BY MOUTH TWICE DAILY What changed:  See the new instructions.   metoCLOPramide 5 MG tablet Commonly known as:  REGLAN TAKE 1 TABLET(5 MG) BY MOUTH FOUR TIMES DAILY BEFORE MEALS AND AT BEDTIME What changed:  See the new instructions.   metoprolol succinate 25 MG 24 hr tablet Commonly known as:  TOPROL-XL Take 1 tablet (25 mg total) by mouth daily. What changed:    medication strength  how much to take   nitroGLYCERIN 0.4 MG SL tablet Commonly known as:  NITROSTAT Place 1 tablet (0.4 mg total) under the tongue every 5 (five) minutes as needed for  chest pain (MAX 3 TABLETS).   SYNTHROID 112 MCG tablet Generic drug:  levothyroxine TAKE 1/2 TABLET BY MOUTH DAILY BEFORE BREAKFAST What changed:  See the new instructions.   levothyroxine 112 MCG tablet Commonly known as:  SYNTHROID Take 0.5 tablets (56 mcg total) by mouth daily before breakfast. What changed:  You were already taking a medication with the same name, and this prescription was added. Make sure you understand how and when to take each.   XARELTO 20 MG Tabs tablet Generic drug:  rivaroxaban TAKE 1 TABLET(20 MG) BY MOUTH DAILY WITH SUPPER What changed:  See the new instructions.      Allergies    Allergen Reactions  . Contrast Media [Iodinated Diagnostic Agents] Anaphylaxis and Swelling  . Iodine Anaphylaxis and Swelling  . Shellfish Allergy Anaphylaxis    unknown  . Gabapentin Hives   Follow-up Information    [name], MD. Schedule an appointment as soon as possible for a visit in 1 week(s).   Specialty:  Family Medicine Why:  Hospital follow up Contact information: [address] [place] [address] [phone]        [name], MD .   Specialties:  Cardiology, Radiology Contact information: [address] [place] [address] [phone]            The results of significant diagnostics from this hospitalization (including imaging, microbiology, ancillary and laboratory) are listed below for reference.    Significant Diagnostic Studies: Dg Chest 2 View  Result Date: 08/19/2018 CLINICAL DATA:  Fever EXAM: CHEST - 2 VIEW COMPARISON:  08/15/2018 FINDINGS: Chronic cardiomegaly and vascular pedicle widening. Prior CABG. Stable interstitial coarsening when compared to recent prior and 2017. There is no edema, consolidation, effusion, or pneumothorax. No acute osseous finding. IMPRESSION: Stable compared to priors.  No acute finding. Electronically Signed   By: Monte Fantasia M.D.   On: 08/19/2018 04:45   Dg Chest 2 View  Result Date: 08/15/2018 CLINICAL DATA:  83 year old female with weakness and diaphoresis. EXAM: CHEST - 2 VIEW COMPARISON:  05/26/2016 chest radiographs and earlier. FINDINGS: Semi upright AP and lateral views of the chest. Stable cardiomegaly and mediastinal contours. Prior sternotomy, CABG. Lung volumes are stable and within normal limits. No pneumothorax, pulmonary edema, pleural effusion or confluent pulmonary opacity. No acute osseous abnormality identified. Negative visible bowel gas pattern. IMPRESSION: Stable cardiomegaly. No acute cardiopulmonary abnormality. Electronically Signed   By: Genevie Ann M.D.    On: 08/15/2018 19:10   Ct Head Wo Contrast  Result Date: 08/15/2018 CLINICAL DATA:  Dizziness and lightheadedness EXAM: CT HEAD WITHOUT CONTRAST TECHNIQUE: Contiguous axial images were obtained from the base of the skull through the vertex without intravenous contrast. COMPARISON:  Head CT 09/29/2017 FINDINGS: Brain: There is no mass, hemorrhage or extra-axial collection. Mild generalized atrophy. 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 There is chronic microangiopathic white matter hypoattenuation. No acute parenchymal abnormality. Vascular: No abnormal hyperdensity of the major intracranial arteries or dural venous sinuses. No intracranial atherosclerosis. Skull: The visualized skull base, calvarium and extracranial soft tissues are normal. Sinuses/Orbits: No fluid levels or advanced mucosal thickening of the visualized paranasal sinuses. No mastoid or middle ear effusion. The orbits are normal. IMPRESSION: Atrophy and chronic small vessel disease without acute intracranial abnormality. Electronically Signed   By: Ulyses Jarred M.D.   On: 08/15/2018 19:40    Microbiology: Recent Results (from the past 240 hour(s))  Blood Culture (routine x 2)  Status: None   Collection Time: 08/19/18  3:04 AM  Result Value Ref Range Status   Specimen Description BLOOD RIGHT ANTECUBITAL  Final   Special Requests   Final    BOTTLES DRAWN AEROBIC AND ANAEROBIC Blood Culture results may not be optimal due to an inadequate volume of blood received in culture bottles   Culture   Final    NO GROWTH 5 DAYS Performed at [facility] [address] [place], [place] [address]    Report Status 08/24/2018 FINAL  Final  Blood Culture (routine x 2)     Status: None   Collection Time: 08/19/18  3:40 AM  Result Value Ref Range Status   Specimen Description BLOOD RIGHT FOREARM  Final   Special Requests   Final    BOTTLES DRAWN AEROBIC AND ANAEROBIC Blood Culture results may not be optimal due to an  excessive volume of blood received in culture bottles   Culture   Final    NO GROWTH 5 DAYS Performed at [facility], [place] [address] [place], [place] [address]    Report Status 08/24/2018 FINAL  Final  MRSA PCR Screening     Status: None   Collection Time: 08/19/18  6:27 AM  Result Value Ref Range Status   MRSA by PCR NEGATIVE NEGATIVE Final    Comment:        The GeneXpert MRSA Assay (FDA approved for NASAL specimens only), is one component of a comprehensive MRSA colonization surveillance program. It is not intended to diagnose MRSA infection nor to guide or monitor treatment for MRSA infections. Performed at [facility], [place] [address], [place], [place] [address]   Respiratory Panel by PCR     Status: None   Collection Time: 08/20/18  7:25 AM  Result Value Ref Range Status   Adenovirus NOT DETECTED NOT DETECTED Final   Coronavirus 229E NOT DETECTED NOT DETECTED Final   Coronavirus HKU1 NOT DETECTED NOT DETECTED Final   Coronavirus NL63 NOT DETECTED NOT DETECTED Final   Coronavirus OC43 NOT DETECTED NOT DETECTED Final   Metapneumovirus NOT DETECTED NOT DETECTED Final   Rhinovirus / Enterovirus NOT DETECTED NOT DETECTED Final   Influenza A NOT DETECTED NOT DETECTED Final   Influenza B NOT DETECTED NOT DETECTED Final   Parainfluenza Virus 1 NOT DETECTED NOT DETECTED Final   Parainfluenza Virus 2 NOT DETECTED NOT DETECTED Final   Parainfluenza Virus 3 NOT DETECTED NOT DETECTED Final   Parainfluenza Virus 4 NOT DETECTED NOT DETECTED Final   Respiratory Syncytial Virus NOT DETECTED NOT DETECTED Final   Bordetella pertussis NOT DETECTED NOT DETECTED Final   Chlamydophila pneumoniae NOT DETECTED NOT DETECTED Final   Mycoplasma pneumoniae NOT DETECTED NOT DETECTED Final    Comment: Performed at [facility], [place] [address], [place], [place] [address]     Labs: Basic Metabolic Panel: Recent Labs  Lab 08/21/18 0736 08/22/18 0219 08/23/18 0243  08/24/18 0347 08/26/18 0417  NA 134* 137 136 138 134*  K 3.5 3.0* 3.3* 3.4* 3.4*  CL 104 104 102 102 102  CO2 20* 23 24 25 25   GLUCOSE 209* 161* 164* 146* 250*  BUN 9 11 11 11 16   CREATININE 0.71 0.74 0.68 0.78 0.79  CALCIUM 7.9* 8.1* 8.2* 8.8* 8.7*  MG  --   --  1.5* 1.8 1.8   Liver Function Tests: No results for input(s): AST, ALT, ALKPHOS, BILITOT, PROT, ALBUMIN in the last 168 hours.  No results for input(s): LIPASE, AMYLASE in the last 168 hours. No results for input(s): AMMONIA in the last 168 hours. CBC: Recent Labs  Lab 08/20/18 0241  WBC 16.3*  HGB 12.6  HCT 36.9  MCV 98.1  PLT 147*   Cardiac Enzymes: No results for input(s): CKTOTAL, CKMB, CKMBINDEX, TROPONINI in the last 168 hours. BNP: BNP (last 3 results) No results for input(s): BNP in the last 8760 hours.  ProBNP (last 3 results) No results for input(s): PROBNP in the last 8760 hours.  CBG: Recent Labs  Lab 08/25/18 0758 08/25/18 1106 08/25/18 1613 08/25/18 2105 08/26/18 0738  GLUCAP 144* 145* 212* 190* 175*       Signed:  Kataleah Nguyen  [facility] 08/26/2018, 10:16 AM

## 2018-08-30 DIAGNOSIS — D72828 Other elevated white blood cell count: Secondary | ICD-10-CM | POA: Diagnosis not present

## 2018-08-30 DIAGNOSIS — B348 Other viral infections of unspecified site: Secondary | ICD-10-CM | POA: Diagnosis not present

## 2018-08-30 DIAGNOSIS — R5381 Other malaise: Secondary | ICD-10-CM | POA: Diagnosis not present

## 2018-08-31 ENCOUNTER — Telehealth: Payer: Self-pay | Admitting: Cardiology

## 2018-08-31 NOTE — Telephone Encounter (Addendum)
° ° °  Daughter Santiago Glad calling with questions regarding [employer].   Please call      1. Is this related to a heart monitor you are wearing?  (If the patient says no, please ask     if they are caling about ICD/pacemaker.) yes  2. What is your issue?? Has questions about returning monitor and "patches"   Please route to covering RN/CMA/RMA for results. Route to monitor technicians or your monitor tech representative for your site for any technical concerns

## 2018-08-31 NOTE — Telephone Encounter (Signed)
Patient monitor log has been lost.  Daughter, Santiago Glad, needed instructions to remove monitor and ship back.  Explained to Santiago Glad, how to remove monitor.  Stick monitor on blank sheet of paper with her moms name, DOB, and monitor serial #, (given).  Return to [employer], postage paid, in box given at time of application.  Informed we should have results in 5-7 days after monitor mailed back.

## 2018-09-01 ENCOUNTER — Ambulatory Visit: Payer: Medicare Other | Admitting: Cardiology

## 2018-09-06 NOTE — Telephone Encounter (Signed)
Forms completed and faxed.  

## 2018-09-12 DIAGNOSIS — I480 Paroxysmal atrial fibrillation: Secondary | ICD-10-CM | POA: Diagnosis not present

## 2018-09-12 DIAGNOSIS — N390 Urinary tract infection, site not specified: Secondary | ICD-10-CM | POA: Diagnosis not present

## 2018-09-13 ENCOUNTER — Ambulatory Visit (INDEPENDENT_AMBULATORY_CARE_PROVIDER_SITE_OTHER): Payer: Medicare Other | Admitting: Cardiology

## 2018-09-13 ENCOUNTER — Encounter: Payer: Self-pay | Admitting: Cardiology

## 2018-09-13 ENCOUNTER — Other Ambulatory Visit: Payer: Self-pay

## 2018-09-13 VITALS — BP 110/62 | HR 73 | Ht 69.0 in | Wt 188.0 lb

## 2018-09-13 DIAGNOSIS — Z951 Presence of aortocoronary bypass graft: Secondary | ICD-10-CM | POA: Diagnosis not present

## 2018-09-13 DIAGNOSIS — I482 Chronic atrial fibrillation, unspecified: Secondary | ICD-10-CM | POA: Diagnosis not present

## 2018-09-13 DIAGNOSIS — I480 Paroxysmal atrial fibrillation: Secondary | ICD-10-CM

## 2018-09-13 DIAGNOSIS — N3080 Other cystitis without hematuria: Secondary | ICD-10-CM | POA: Diagnosis not present

## 2018-09-13 DIAGNOSIS — I10 Essential (primary) hypertension: Secondary | ICD-10-CM | POA: Diagnosis not present

## 2018-09-13 DIAGNOSIS — R5381 Other malaise: Secondary | ICD-10-CM | POA: Diagnosis not present

## 2018-09-13 DIAGNOSIS — Z7901 Long term (current) use of anticoagulants: Secondary | ICD-10-CM | POA: Diagnosis not present

## 2018-09-13 DIAGNOSIS — I5032 Chronic diastolic (congestive) heart failure: Secondary | ICD-10-CM

## 2018-09-13 NOTE — Patient Instructions (Addendum)
Medication Instructions:  Your physician recommends that you continue on your current medications as directed. Please refer to the Current Medication list given to you today. If you need a refill on your cardiac medications before your next appointment, please call your pharmacy.   Lab work: None  If you have labs (blood work) drawn today and your tests are completely normal, you will receive your results only by: Marland Kitchen MyChart Message (if you have MyChart) OR . A paper copy in the mail If you have any lab test that is abnormal or we need to change your treatment, we will call you to review the results.  Testing/Procedures: None   Follow-Up: At [facility], you and your health needs are our priority.  As part of our continuing mission to provide you with exceptional heart care, we have created designated Provider Care Teams.  These Care Teams include your primary Cardiologist (physician) and Advanced Practice Providers (APPs -  Physician Assistants and Nurse Practitioners) who all work together to provide you with the care you need, when you need it.  Your physician recommends that you schedule a follow-up appointment in: [place].  Any Other Special Instructions Will Be Listed Below (If Applicable).

## 2018-09-13 NOTE — Progress Notes (Signed)
09/13/2018 Lindsay Nguyen   27-Dec-1935  [id number]  Primary Physician [name], MD Primary Cardiologist: dr Gwenlyn Found  HPI:  Pleasant 83 year old, female accompanied by her son and daughter. Her son [name] was an Therapist, sports on unit 2000 at [employer] [employer] and her daughter is a NP in [place].   The pt had aNSTEMIAugust 19, 2011 and had an LAD BMS placed. Because of a generous thoracic aorta on cath she was seen by Dr. Gilford Raid for evaluation. She was cath'd later in November2011revealing aggressive "in-stent restenosis" within the proximal LAD stent, as well as in her diagonal branch and AV groove circumflex. She ultimately underwent CABG x 3 Nov 2011by Dr. Gilford Raid with LIMA to her LAD,SVG-Dx,and an SVG-OMwith resection and grafting of her thoracic aortic aneurysm. She had a Myoview performed November 04, 2010, which was nonischemic.She developed symptomaticatrial fibrillationin Feb 2017.She had a DCCV 09/30/15 to NSR.  She was admitted to the hospital in [facility] 10/15/15-10/17/15 with acute respiratory failure which apparently was a combination of COPD exacerbation and diastolic CHF. Her echo there showed pulmonary HTN but a f/u echo in April 2017 showed normal pulmonary pressures and normal LVF. A sleep study in April 2017 was negative for sleep apnea.   When she saw Dr Gwenlyn Found in Oct 2017 she was noted to be in AF and symptomatic. She underwent another DCCV 06/15/16 and was followed up in the AF clinic. Antiarrhythmics and further therapy was discussed..DrAllred and he feltthe best antiarrythmic, due to baseline conduction issues, would be Tikosyn.However, she is on an antidepressant and this in combo with Tiksoyn would cause concern for prolonged QTC. The pt/son stated thenthat it would be impossible for pt to stop antidepressant She would not be an ablation candidate for fear of causingsevere AV Block resulting in the need for a pacemaker,which Dr. Rayann Heman feels that this  may be in her future at some point.   She was seen in the office 08/18/2018 with weakness.  The patient had persistent decline and was not very active at home. She is DNR.  She was brought to the ED 08/15/2018 by her family with weakness.  Her K+ was 2.9 and she was in AF and bradycardic- HR 45-55 -but no other significant findings.  Since then they had held her Toprol for a few doses. Last PM her HR was in the 80's and they resumed it.  I ordered a ZIO monitor to r/o bradycardia as a cause for her decline.   She ended up in the ED within 24 hours with profound weakness and was found to be febrile with a temp of 102.  She was placed on ABs but cultures came back negative.  She was ultimately DC'd to SNF rehab. She is in the office today for follow up.  She is doing better, hopes to return home later this week.  I reviewed her ZIO.  She has CAF, rates briefly in the high 40's but no sustained bradycardia. She did have a couple runs of NSVT- 4-7 beats.     Current Outpatient Medications  Medication Sig Dispense Refill  . albuterol (PROVENTIL) (2.5 MG/3ML) 0.083% nebulizer solution Take 2.5 mg by nebulization every 2 (two) hours as needed for wheezing or shortness of breath.   5  . amLODipine (NORVASC) 5 MG tablet Take 1 tablet (5 mg total) by mouth daily.    Marland Kitchen aspirin EC 81 MG tablet Take 1 tablet (81 mg total) by mouth daily. 90 tablet 3  .  atorvastatin (LIPITOR) 40 MG tablet Take 1 tablet (40 mg total) by mouth daily at 6 PM. 90 tablet 2  . budesonide (PULMICORT) 0.25 MG/2ML nebulizer solution Take 0.25 mg by nebulization daily as needed (SOB).     Marland Kitchen donepezil (ARICEPT) 10 MG tablet TAKE 1 TABLET(10 MG) BY MOUTH AT BEDTIME (Patient taking differently: Take 10 mg by mouth every evening. ) 90 tablet 3  . empagliflozin (JARDIANCE) 25 MG TABS tablet Take 12.5 mg by mouth daily. 30 tablet 3  . escitalopram (LEXAPRO) 10 MG tablet Take 1 tablet (10 mg total) by mouth daily. 2 tablet 0  . JANUVIA 100 MG  tablet TAKE 1 TABLET BY MOUTH DAILY (Patient taking differently: Take 100 mg by mouth daily. ) 90 tablet 0  . levothyroxine (SYNTHROID) 112 MCG tablet Take 0.5 tablets (56 mcg total) by mouth daily before breakfast.    . loratadine (CLARITIN) 10 MG tablet Take 10 mg by mouth daily.     Marland Kitchen losartan (COZAAR) 50 MG tablet TAKE 1 TABLET BY MOUTH DAILY(DISCONTINUE VALSARTAN) (Patient taking differently: Take 50 mg by mouth daily. ) 90 tablet 1  . memantine (NAMENDA) 10 MG tablet TAKE 1 TABLET(10 MG) BY MOUTH TWICE DAILY (Patient taking differently: Take 10 mg by mouth 2 (two) times daily. ) 60 tablet 0  . metoCLOPramide (REGLAN) 5 MG tablet TAKE 1 TABLET(5 MG) BY MOUTH FOUR TIMES DAILY BEFORE MEALS AND AT BEDTIME (Patient taking differently: Take 5 mg by mouth 3 (three) times daily. ) 120 tablet 3  . metoprolol succinate (TOPROL-XL) 25 MG 24 hr tablet Take 1 tablet (25 mg total) by mouth daily.    . nitroGLYCERIN (NITROSTAT) 0.4 MG SL tablet Place 1 tablet (0.4 mg total) under the tongue every 5 (five) minutes as needed for chest pain (MAX 3 TABLETS). 25 tablet 4  . SYNTHROID 112 MCG tablet TAKE 1/2 TABLET BY MOUTH DAILY BEFORE BREAKFAST (Patient taking differently: Take 56 mcg by mouth daily before breakfast. ) 90 tablet 0  . XARELTO 20 MG TABS tablet TAKE 1 TABLET(20 MG) BY MOUTH DAILY WITH SUPPER (Patient taking differently: Take 20 mg by mouth daily. ) 90 tablet 3   No current facility-administered medications for this visit.     Allergies  Allergen Reactions  . Contrast Media [Iodinated Diagnostic Agents] Anaphylaxis and Swelling  . Iodine Anaphylaxis and Swelling  . Shellfish Allergy Anaphylaxis    unknown  . Gabapentin Hives    Past Medical History:  Diagnosis Date  . Anemia   . Atrial fibrillation ([place])    persistent; on Eliquis  . CAD (coronary artery disease)    2D ECHO, 11/04/2010 - EF >55%, mild-moderate mitral regurgitation, moderate tricuspid regurgitation  . Dementia ([place])   .  Diabetes mellitus   . GERD (gastroesophageal reflux disease)   . Hyperlipidemia   . Hypertension   . Osteopenia   . Stroke ([place])   . Thoracic aortic aneurysm (HCC)    status post resection and grafting  . Thyroid disease    hypothyroid    Social History   Socioeconomic History  . Marital status: Married    Spouse name: Not on file  . Number of children: Not on file  . Years of education: Not on file  . Highest education level: Not on file  Occupational History  . Not on file  Social Needs  . Financial resource strain: Not on file  . Food insecurity:    Worry: Not on file  Inability: Not on file  . Transportation needs:    Medical: Not on file    Non-medical: Not on file  Tobacco Use  . Smoking status: Never Smoker  . Smokeless tobacco: Never Used  Substance and Sexual Activity  . Alcohol use: No  . Drug use: No  . Sexual activity: Not on file  Lifestyle  . Physical activity:    Days per week: Not on file    Minutes per session: Not on file  . Stress: Not on file  Relationships  . Social connections:    Talks on phone: Not on file    Gets together: Not on file    Attends religious service: Not on file    Active member of club or organization: Not on file    Attends meetings of clubs or organizations: Not on file    Relationship status: Not on file  . Intimate partner violence:    Fear of current or ex partner: Not on file    Emotionally abused: Not on file    Physically abused: Not on file    Forced sexual activity: Not on file  Other Topics Concern  . Not on file  Social History Narrative   Patient's daughter is a Designer, jewellery who does hospitalist work at [employer].  Her son is a Marine scientist     Family History  Problem Relation Age of Onset  . Hypertension Mother   . Osteoporosis Mother   . Heart disease Father   . Stroke Father   . Alzheimer's disease Father   . Heart attack Father      Review of Systems: General: negative for  chills, fever, night sweats or weight changes.  Cardiovascular: negative for chest pain, dyspnea on exertion, edema, orthopnea, palpitations, paroxysmal nocturnal dyspnea or shortness of breath Dermatological: negative for rash Respiratory: negative for cough or wheezing Urologic: negative for hematuria Abdominal: negative for nausea, vomiting, diarrhea, bright red blood per rectum, melena, or hematemesis Neurologic: negative for visual changes, syncope, or dizziness All other systems reviewed and are otherwise negative except as noted above.    Blood pressure 110/62, pulse 73, height 5\' 9"  (1.753 m), weight 188 lb (85.3 kg), SpO2 96 %.  General appearance: alert, cooperative and no distress Neck: no carotid bruit and no JVD Lungs: few end inspiratory crackles at bases Heart: irregularly irregular rhythm Extremities: no edema Skin: pale warm and dry Neurologic: Grossly normal  EKG AF- rate 75- QTc 471, IVCD  ASSESSMENT AND PLAN:   Weakness Some improvement after her recent hospitalization for FUO  PAF (paroxysmal atrial fibrillation) ([place]) She has been in AF the last few office visits. Rates acceptable on ZIO- brief runs of NSVT noted-continue low dose Toprol  Hx of CABG Hx of CABG x 3 with thoracic aneurysm repair 0932  Acute diastolic (congestive) heart failure (HCC) History of diastolic CHF in 6712- echo July 2019- EF 60-65%  Chronic anticoagulation Eliquis- CHA2Ds2VASc=7  History of depression Hx of depression and mild cognitive deficit on Lexapro and Aricept  PLAN  Same Rx for now- check electrolytes and Mg++ when she sees her PCP in a week- these have been running low at times requiring replacement.  F/U Dr Gwenlyn Found 6-8 weeks.   [name] PA-C 09/13/2018 10:48 AM

## 2018-09-14 ENCOUNTER — Ambulatory Visit: Payer: Medicare Other | Admitting: Adult Health

## 2018-09-15 ENCOUNTER — Telehealth: Payer: Self-pay

## 2018-09-15 ENCOUNTER — Encounter: Payer: Self-pay | Admitting: Adult Health

## 2018-09-15 NOTE — Telephone Encounter (Signed)
Pt no show for appt on 09/14/2018. 

## 2018-09-16 DIAGNOSIS — I5032 Chronic diastolic (congestive) heart failure: Secondary | ICD-10-CM | POA: Diagnosis not present

## 2018-09-16 DIAGNOSIS — Z951 Presence of aortocoronary bypass graft: Secondary | ICD-10-CM | POA: Diagnosis not present

## 2018-09-16 DIAGNOSIS — E1151 Type 2 diabetes mellitus with diabetic peripheral angiopathy without gangrene: Secondary | ICD-10-CM | POA: Diagnosis not present

## 2018-09-16 DIAGNOSIS — Z7901 Long term (current) use of anticoagulants: Secondary | ICD-10-CM | POA: Diagnosis not present

## 2018-09-16 DIAGNOSIS — I2510 Atherosclerotic heart disease of native coronary artery without angina pectoris: Secondary | ICD-10-CM | POA: Diagnosis not present

## 2018-09-16 DIAGNOSIS — Z8781 Personal history of (healed) traumatic fracture: Secondary | ICD-10-CM | POA: Diagnosis not present

## 2018-09-16 DIAGNOSIS — Z7982 Long term (current) use of aspirin: Secondary | ICD-10-CM | POA: Diagnosis not present

## 2018-09-16 DIAGNOSIS — K219 Gastro-esophageal reflux disease without esophagitis: Secondary | ICD-10-CM | POA: Diagnosis not present

## 2018-09-16 DIAGNOSIS — J449 Chronic obstructive pulmonary disease, unspecified: Secondary | ICD-10-CM | POA: Diagnosis not present

## 2018-09-16 DIAGNOSIS — Z9049 Acquired absence of other specified parts of digestive tract: Secondary | ICD-10-CM | POA: Diagnosis not present

## 2018-09-16 DIAGNOSIS — Z9851 Tubal ligation status: Secondary | ICD-10-CM | POA: Diagnosis not present

## 2018-09-16 DIAGNOSIS — E785 Hyperlipidemia, unspecified: Secondary | ICD-10-CM | POA: Diagnosis not present

## 2018-09-16 DIAGNOSIS — I480 Paroxysmal atrial fibrillation: Secondary | ICD-10-CM | POA: Diagnosis not present

## 2018-09-16 DIAGNOSIS — F0280 Dementia in other diseases classified elsewhere without behavioral disturbance: Secondary | ICD-10-CM | POA: Diagnosis not present

## 2018-09-16 DIAGNOSIS — I110 Hypertensive heart disease with heart failure: Secondary | ICD-10-CM | POA: Diagnosis not present

## 2018-09-16 DIAGNOSIS — M8580 Other specified disorders of bone density and structure, unspecified site: Secondary | ICD-10-CM | POA: Diagnosis not present

## 2018-09-16 DIAGNOSIS — E039 Hypothyroidism, unspecified: Secondary | ICD-10-CM | POA: Diagnosis not present

## 2018-09-16 DIAGNOSIS — N3080 Other cystitis without hematuria: Secondary | ICD-10-CM | POA: Diagnosis not present

## 2018-09-16 DIAGNOSIS — Z8673 Personal history of transient ischemic attack (TIA), and cerebral infarction without residual deficits: Secondary | ICD-10-CM | POA: Diagnosis not present

## 2018-09-16 DIAGNOSIS — F329 Major depressive disorder, single episode, unspecified: Secondary | ICD-10-CM | POA: Diagnosis not present

## 2018-09-16 DIAGNOSIS — K3184 Gastroparesis: Secondary | ICD-10-CM | POA: Diagnosis not present

## 2018-09-16 DIAGNOSIS — B952 Enterococcus as the cause of diseases classified elsewhere: Secondary | ICD-10-CM | POA: Diagnosis not present

## 2018-09-16 DIAGNOSIS — E1143 Type 2 diabetes mellitus with diabetic autonomic (poly)neuropathy: Secondary | ICD-10-CM | POA: Diagnosis not present

## 2018-09-16 DIAGNOSIS — I712 Thoracic aortic aneurysm, without rupture: Secondary | ICD-10-CM | POA: Diagnosis not present

## 2018-09-19 ENCOUNTER — Encounter: Payer: Self-pay | Admitting: Family Medicine

## 2018-09-19 ENCOUNTER — Ambulatory Visit (INDEPENDENT_AMBULATORY_CARE_PROVIDER_SITE_OTHER): Payer: Medicare Other | Admitting: Family Medicine

## 2018-09-19 DIAGNOSIS — G301 Alzheimer's disease with late onset: Secondary | ICD-10-CM

## 2018-09-19 DIAGNOSIS — Z8673 Personal history of transient ischemic attack (TIA), and cerebral infarction without residual deficits: Secondary | ICD-10-CM

## 2018-09-19 DIAGNOSIS — F0280 Dementia in other diseases classified elsewhere without behavioral disturbance: Secondary | ICD-10-CM | POA: Diagnosis not present

## 2018-09-19 DIAGNOSIS — E1165 Type 2 diabetes mellitus with hyperglycemia: Secondary | ICD-10-CM | POA: Diagnosis not present

## 2018-09-19 DIAGNOSIS — IMO0001 Reserved for inherently not codable concepts without codable children: Secondary | ICD-10-CM

## 2018-09-19 DIAGNOSIS — Z09 Encounter for follow-up examination after completed treatment for conditions other than malignant neoplasm: Secondary | ICD-10-CM | POA: Diagnosis not present

## 2018-09-19 LAB — CBC WITH DIFFERENTIAL/PLATELET
Absolute Monocytes: 818 cells/uL (ref 200–950)
Basophils Absolute: 22 cells/uL (ref 0–200)
Basophils Relative: 0.3 %
EOS ABS: 168 {cells}/uL (ref 15–500)
Eosinophils Relative: 2.3 %
HEMATOCRIT: 40.2 % (ref 35.0–45.0)
Hemoglobin: 13.2 g/dL (ref 11.7–15.5)
Lymphs Abs: 1599 cells/uL (ref 850–3900)
MCH: 32.3 pg (ref 27.0–33.0)
MCHC: 32.8 g/dL (ref 32.0–36.0)
MCV: 98.3 fL (ref 80.0–100.0)
MPV: 11.5 fL (ref 7.5–12.5)
Monocytes Relative: 11.2 %
Neutro Abs: 4694 cells/uL (ref 1500–7800)
Neutrophils Relative %: 64.3 %
Platelets: 243 10*3/uL (ref 140–400)
RBC: 4.09 10*6/uL (ref 3.80–5.10)
RDW: 12.7 % (ref 11.0–15.0)
Total Lymphocyte: 21.9 %
WBC: 7.3 10*3/uL (ref 3.8–10.8)

## 2018-09-19 LAB — BASIC METABOLIC PANEL WITH GFR
BUN/Creatinine Ratio: 18 (calc) (ref 6–22)
BUN: 20 mg/dL (ref 7–25)
CO2: 27 mmol/L (ref 20–32)
CREATININE: 1.13 mg/dL — AB (ref 0.60–0.88)
Calcium: 9 mg/dL (ref 8.6–10.4)
Chloride: 102 mmol/L (ref 98–110)
GFR, EST AFRICAN AMERICAN: 53 mL/min/{1.73_m2} — AB (ref >=60)
GFR, Est Non African American: 46 mL/min/{1.73_m2} — ABNORMAL LOW (ref >=60)
Glucose, Bld: 335 mg/dL — ABNORMAL HIGH (ref 65–99)
Potassium: 4.2 mmol/L (ref 3.5–5.3)
Sodium: 137 mmol/L (ref 135–146)

## 2018-09-19 LAB — MAGNESIUM: Magnesium: 1.6 mg/dL (ref 1.5–2.5)

## 2018-09-19 MED ORDER — METFORMIN HCL 500 MG PO TABS: 1000.0000 mg | ORAL_TABLET | Freq: Two times a day (BID) | ORAL | 3 refills | Status: DC

## 2018-09-19 NOTE — Progress Notes (Signed)
Subjective:    Patient ID: Lindsay Nguyen, female    DOB: [birth year], 83 y.o.   MRN: [id number]  HPI  Patient was admitted to the hospital recently with bowel illness.  I have copied relevant portions of the discharge summary below for reference: Admit date: 08/19/2018 Discharge date: 08/26/2018  Time spent: 45 minutes  Recommendations for Outpatient Follow-up:  Patient will be discharged to nursing facility, continue physical and occupational therapy.  Patient will need to follow up with primary care provider within one week of discharge repeat BMP and magnesium.  Patient should continue medications as prescribed.  Patient should follow a heart healthy/carb modified diet.   Patient has midline for access placement. Family has requested that this stay in place for discharge in the event that patient will need electrolyte replacement. This can be a source of infection, which was relayed to the family.   Discharge Diagnoses:  Principal problem: Febrile illness Essential hypertension Paroxysmal atrial fibrillation Chronic diastolic heart failure Hypokalemia/hypomagnesemia Diabetes mellitus, type II Hypothyroidism  Dementia Deconditioning  History of present illness:  On 08/19/2018 by Dr. Steward Ros Gerlene Burdock a 83 y.o.femalewith medical history significant ofcoronary artery disease, diabetes, [name] comes in with weakness today with fever and nausea. Patient has not been feeling well for several days. She denies any shortness of breath or cough. She has been nauseous without vomiting. She is denies any rashes on her body. She denies any diarrhea. She denies any abdominal pain or chest pain. She denies any nasal congestion. She just feels awful and weak. Patient is being referred for admission for fever over 101 of unclear source. Both her son and daughter present her daughter is a Designer, jewellery at [employer] and her son is a retired Marine scientist. 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  Hospital Course:  Febrile illness -Present on admission, however patient did rule out for sepsis -Was placed on vancomycin and cefepime however these were discontinued given that no source of infection was found -Respiratory viral panel and influenza PCR unremarkable -Blood cultures showed no growth to date -UA and chest x-ray reviewed, unremarkable for infection -Remains afebrile with no leukocytosis  Essential hypertension -Continue metoprolol, losartan, amlodipine -BP slightly uncontrolled, however cardiology did recommend increasing amlodipine to 5 mg daily  Paroxysmal atrial fibrillation -Continue Xarelto for anticoagulation -Rate and rhythm controlled, continue metoprolol -Cardiology consulted and appreciated, recommending that family or patient to mail in box for interrogation of patient's device  Chronic diastolic heart failure -Patient does not appear to be volume overloaded.  Currently euvolemic and compensated -Echocardiogram 02/21/2018 showed an EF of 6065%.  Study was technically insufficient to allow LV diastolic function evaluation -Cardiology suggested increasing Norvasc to 5 mg daily  Hypokalemia/hypomagnesemia -Potassium replaced, repeat BMP in 1 week -Medium 1.8, recommend repeating in 1 week  Diabetes mellitus, type II -Hemoglobin A1c 8.9 -Continue home medications on discharge, Jardiance, Januvia  Hypothyroidism  -Continue Synthroid  Dementia -Continue Namenda and Aricept  Deconditioning -In the presence of febrile illness.  Patient does appear to be very weak and requiring rehab.  PT consulted recommending SNF.  09/19/18 Patient just got home from skilled nursing facility.  She is receiving outpatient physical therapy through home health agency.  Since discharge from the hospital, she has had no further febrile illness.  She has been afebrile now for more than 3 weeks.  She denies any cough.  She denies any chest pain.   She denies any shortness of breath.  She denies any dyspnea on exertion.  She denies any otalgia or sore throat.  She denies any dysuria, urgency, or frequency.  She denies any nausea or vomiting or diarrhea.  TSH was checked in January and was normal.  Hemoglobin A1c was checked and found to be elevated at 8.9.  Patient has been compliant taking her Januvia and her Jardiance on a daily basis.  Previously she was on metformin and this was stopped due to her chronic abdominal discomfort and nausea however this was found to be gastroparesis and responded to Reglan.  She has not been back on metformin since.  Past Medical History:  Diagnosis Date  . Anemia   . Atrial fibrillation ([place])    persistent; on Eliquis  . CAD (coronary artery disease)    2D ECHO, 11/04/2010 - EF >55%, mild-moderate mitral regurgitation, moderate tricuspid regurgitation  . Dementia ([place])   . Diabetes mellitus   . GERD (gastroesophageal reflux disease)   . Hyperlipidemia   . Hypertension   . Osteopenia   . Stroke ([place])   . Thoracic aortic aneurysm (HCC)    status post resection and grafting  . Thyroid disease    hypothyroid   Past Surgical History:  Procedure Laterality Date  . CARDIAC CATHETERIZATION  06/18/2010   Recommended CABG  . CARDIOVERSION N/A 09/30/2015   Procedure: CARDIOVERSION;  Surgeon: [name], MD;  Location: [facility] ENDOSCOPY;  Service: Cardiovascular;  Laterality: N/A;  . CARDIOVERSION N/A 06/05/2016   Procedure: CARDIOVERSION;  Surgeon: [name], MD;  Location: [facility] ENDOSCOPY;  Service: Cardiovascular;  Laterality: N/A;  . CHOLECYSTECTOMY    . CORONARY ARTERY BYPASS GRAFT  06/18/2010   LIMA-LAD, vein-diagonal branch, vein-obtuse marginal branch, and resectioning and grafting of throacic aortic aneurysm  . ELBOW SURGERY  1979   fracture left  . THORACIC AORTIC ANEURYSM REPAIR    . TONSILLECTOMY  1945  . TUBAL LIGATION  1971   Current Outpatient Medications on File Prior to Visit  Medication  Sig Dispense Refill  . albuterol (PROVENTIL) (2.5 MG/3ML) 0.083% nebulizer solution Take 2.5 mg by nebulization every 2 (two) hours as needed for wheezing or shortness of breath.   5  . amLODipine (NORVASC) 5 MG tablet Take 1 tablet (5 mg total) by mouth daily.    Marland Kitchen aspirin EC 81 MG tablet Take 1 tablet (81 mg total) by mouth daily. 90 tablet 3  . atorvastatin (LIPITOR) 40 MG tablet Take 1 tablet (40 mg total) by mouth daily at 6 PM. 90 tablet 2  . budesonide (PULMICORT) 0.25 MG/2ML nebulizer solution Take 0.25 mg by nebulization daily as needed (SOB).     Marland Kitchen donepezil (ARICEPT) 10 MG tablet TAKE 1 TABLET(10 MG) BY MOUTH AT BEDTIME (Patient taking differently: Take 10 mg by mouth every evening. ) 90 tablet 3  . empagliflozin (JARDIANCE) 25 MG TABS tablet Take 12.5 mg by mouth daily. 30 tablet 3  . escitalopram (LEXAPRO) 10 MG tablet Take 1 tablet (10 mg total) by mouth daily. 2 tablet 0  . JANUVIA 100 MG tablet TAKE 1 TABLET BY MOUTH DAILY (Patient taking differently: Take 100 mg by mouth daily. ) 90 tablet 0  . levothyroxine (SYNTHROID) 112 MCG tablet Take 0.5 tablets (56 mcg total) by mouth daily before breakfast.    . loratadine (CLARITIN) 10 MG tablet Take 10 mg by mouth daily.     Marland Kitchen losartan (COZAAR) 50 MG tablet TAKE 1 TABLET BY MOUTH DAILY(DISCONTINUE VALSARTAN) (Patient taking differently: Take 50 mg  by mouth daily. ) 90 tablet 1  . memantine (NAMENDA) 10 MG tablet TAKE 1 TABLET(10 MG) BY MOUTH TWICE DAILY (Patient taking differently: Take 10 mg by mouth 2 (two) times daily. ) 60 tablet 0  . metoCLOPramide (REGLAN) 5 MG tablet TAKE 1 TABLET(5 MG) BY MOUTH FOUR TIMES DAILY BEFORE MEALS AND AT BEDTIME (Patient taking differently: Take 5 mg by mouth 3 (three) times daily. ) 120 tablet 3  . metoprolol succinate (TOPROL-XL) 25 MG 24 hr tablet Take 1 tablet (25 mg total) by mouth daily.    . nitroGLYCERIN (NITROSTAT) 0.4 MG SL tablet Place 1 tablet (0.4 mg total) under the tongue every 5 (five)  minutes as needed for chest pain (MAX 3 TABLETS). 25 tablet 4  . SYNTHROID 112 MCG tablet TAKE 1/2 TABLET BY MOUTH DAILY BEFORE BREAKFAST (Patient taking differently: Take 56 mcg by mouth daily before breakfast. ) 90 tablet 0  . XARELTO 20 MG TABS tablet TAKE 1 TABLET(20 MG) BY MOUTH DAILY WITH SUPPER (Patient taking differently: Take 20 mg by mouth daily. ) 90 tablet 3   No current facility-administered medications on file prior to visit.    Allergies  Allergen Reactions  . Contrast Media [Iodinated Diagnostic Agents] Anaphylaxis and Swelling  . Iodine Anaphylaxis and Swelling  . Shellfish Allergy Anaphylaxis    unknown  . Gabapentin Hives   Social History   Socioeconomic History  . Marital status: Married    Spouse name: Not on file  . Number of children: Not on file  . Years of education: Not on file  . Highest education level: Not on file  Occupational History  . Not on file  Social Needs  . Financial resource strain: Not on file  . Food insecurity:    Worry: Not on file    Inability: Not on file  . Transportation needs:    Medical: Not on file    Non-medical: Not on file  Tobacco Use  . Smoking status: Never Smoker  . Smokeless tobacco: Never Used  Substance and Sexual Activity  . Alcohol use: No  . Drug use: No  . Sexual activity: Not on file  Lifestyle  . Physical activity:    Days per week: Not on file    Minutes per session: Not on file  . Stress: Not on file  Relationships  . Social connections:    Talks on phone: Not on file    Gets together: Not on file    Attends religious service: Not on file    Active member of club or organization: Not on file    Attends meetings of clubs or organizations: Not on file    Relationship status: Not on file  . Intimate partner violence:    Fear of current or ex partner: Not on file    Emotionally abused: Not on file    Physically abused: Not on file    Forced sexual activity: Not on file  Other Topics Concern  .  Not on file  Social History Narrative   Patient's daughter is a Designer, jewellery who does hospitalist work at [employer].  Her son is a Marine scientist      Review of Systems  All other systems reviewed and are negative.      Objective:   Physical Exam  Constitutional: She is oriented to person, place, and time. She appears well-developed and well-nourished. No distress.  Neck: Neck supple. No JVD present.  Cardiovascular: Normal rate. An irregularly irregular rhythm  present.  Murmur heard. Pulmonary/Chest: Effort normal and breath sounds normal. No respiratory distress. She has no wheezes. She has no rales.  Abdominal: Soft. Bowel sounds are normal. She exhibits no distension. There is no abdominal tenderness. There is no rebound.  Musculoskeletal:        General: No edema.  Neurological: She is alert and oriented to person, place, and time. No cranial nerve deficit. She exhibits normal muscle tone. Coordination normal.  Skin: She is not diaphoretic.  Vitals reviewed.         Assessment & Plan:  Hypomagnesemia - Plan: BASIC METABOLIC PANEL WITH GFR, Magnesium, CBC with Differential/Platelet  Hospital discharge follow-up  Uncontrolled diabetes mellitus type 2 without complications ([place])  History of TIA (transient ischemic attack)  Late onset Alzheimer's disease without behavioral disturbance ([place])  Patient had low magnesium in the hospital.  I will recheck that today along with a CBC and a BMP.  There is no evidence of any febrile illness on exam today.  She is completely asymptomatic despite having been off antibiotics now for 3 weeks.  Therefore I feel no further work-up is necessary for her fever which was most likely a virus.  Given her uncontrolled diabetes I recommended starting metformin 500 mg daily and gradually increasing as the patient will tolerate up to 1000 g twice daily however I cautioned the family to monitor for diarrhea and to stop the medication if  diarrhea worsens.  Continue her current treatment for Alzheimer's disease with Aricept and Namenda.

## 2018-09-20 DIAGNOSIS — I480 Paroxysmal atrial fibrillation: Secondary | ICD-10-CM | POA: Diagnosis not present

## 2018-09-20 DIAGNOSIS — B952 Enterococcus as the cause of diseases classified elsewhere: Secondary | ICD-10-CM | POA: Diagnosis not present

## 2018-09-20 DIAGNOSIS — N3080 Other cystitis without hematuria: Secondary | ICD-10-CM | POA: Diagnosis not present

## 2018-09-20 DIAGNOSIS — F0280 Dementia in other diseases classified elsewhere without behavioral disturbance: Secondary | ICD-10-CM | POA: Diagnosis not present

## 2018-09-20 DIAGNOSIS — E1151 Type 2 diabetes mellitus with diabetic peripheral angiopathy without gangrene: Secondary | ICD-10-CM | POA: Diagnosis not present

## 2018-09-20 DIAGNOSIS — E039 Hypothyroidism, unspecified: Secondary | ICD-10-CM | POA: Diagnosis not present

## 2018-09-21 DIAGNOSIS — E039 Hypothyroidism, unspecified: Secondary | ICD-10-CM | POA: Diagnosis not present

## 2018-09-21 DIAGNOSIS — B952 Enterococcus as the cause of diseases classified elsewhere: Secondary | ICD-10-CM | POA: Diagnosis not present

## 2018-09-21 DIAGNOSIS — N3080 Other cystitis without hematuria: Secondary | ICD-10-CM | POA: Diagnosis not present

## 2018-09-21 DIAGNOSIS — E1151 Type 2 diabetes mellitus with diabetic peripheral angiopathy without gangrene: Secondary | ICD-10-CM | POA: Diagnosis not present

## 2018-09-21 DIAGNOSIS — I480 Paroxysmal atrial fibrillation: Secondary | ICD-10-CM | POA: Diagnosis not present

## 2018-09-21 DIAGNOSIS — F0280 Dementia in other diseases classified elsewhere without behavioral disturbance: Secondary | ICD-10-CM | POA: Diagnosis not present

## 2018-09-22 DIAGNOSIS — I480 Paroxysmal atrial fibrillation: Secondary | ICD-10-CM | POA: Diagnosis not present

## 2018-09-22 DIAGNOSIS — E039 Hypothyroidism, unspecified: Secondary | ICD-10-CM | POA: Diagnosis not present

## 2018-09-22 DIAGNOSIS — N3080 Other cystitis without hematuria: Secondary | ICD-10-CM | POA: Diagnosis not present

## 2018-09-22 DIAGNOSIS — E1151 Type 2 diabetes mellitus with diabetic peripheral angiopathy without gangrene: Secondary | ICD-10-CM | POA: Diagnosis not present

## 2018-09-22 DIAGNOSIS — B952 Enterococcus as the cause of diseases classified elsewhere: Secondary | ICD-10-CM | POA: Diagnosis not present

## 2018-09-22 DIAGNOSIS — F0280 Dementia in other diseases classified elsewhere without behavioral disturbance: Secondary | ICD-10-CM | POA: Diagnosis not present

## 2018-09-23 DIAGNOSIS — E1151 Type 2 diabetes mellitus with diabetic peripheral angiopathy without gangrene: Secondary | ICD-10-CM | POA: Diagnosis not present

## 2018-09-23 DIAGNOSIS — N3080 Other cystitis without hematuria: Secondary | ICD-10-CM | POA: Diagnosis not present

## 2018-09-23 DIAGNOSIS — F0280 Dementia in other diseases classified elsewhere without behavioral disturbance: Secondary | ICD-10-CM | POA: Diagnosis not present

## 2018-09-23 DIAGNOSIS — E039 Hypothyroidism, unspecified: Secondary | ICD-10-CM | POA: Diagnosis not present

## 2018-09-23 DIAGNOSIS — I480 Paroxysmal atrial fibrillation: Secondary | ICD-10-CM | POA: Diagnosis not present

## 2018-09-23 DIAGNOSIS — B952 Enterococcus as the cause of diseases classified elsewhere: Secondary | ICD-10-CM | POA: Diagnosis not present

## 2018-09-26 DIAGNOSIS — E1151 Type 2 diabetes mellitus with diabetic peripheral angiopathy without gangrene: Secondary | ICD-10-CM | POA: Diagnosis not present

## 2018-09-26 DIAGNOSIS — N3080 Other cystitis without hematuria: Secondary | ICD-10-CM | POA: Diagnosis not present

## 2018-09-26 DIAGNOSIS — E039 Hypothyroidism, unspecified: Secondary | ICD-10-CM | POA: Diagnosis not present

## 2018-09-26 DIAGNOSIS — F0280 Dementia in other diseases classified elsewhere without behavioral disturbance: Secondary | ICD-10-CM | POA: Diagnosis not present

## 2018-09-26 DIAGNOSIS — I480 Paroxysmal atrial fibrillation: Secondary | ICD-10-CM | POA: Diagnosis not present

## 2018-09-26 DIAGNOSIS — B952 Enterococcus as the cause of diseases classified elsewhere: Secondary | ICD-10-CM | POA: Diagnosis not present

## 2018-09-27 DIAGNOSIS — N3080 Other cystitis without hematuria: Secondary | ICD-10-CM | POA: Diagnosis not present

## 2018-09-27 DIAGNOSIS — E039 Hypothyroidism, unspecified: Secondary | ICD-10-CM | POA: Diagnosis not present

## 2018-09-27 DIAGNOSIS — I480 Paroxysmal atrial fibrillation: Secondary | ICD-10-CM | POA: Diagnosis not present

## 2018-09-27 DIAGNOSIS — F0280 Dementia in other diseases classified elsewhere without behavioral disturbance: Secondary | ICD-10-CM | POA: Diagnosis not present

## 2018-09-27 DIAGNOSIS — B952 Enterococcus as the cause of diseases classified elsewhere: Secondary | ICD-10-CM | POA: Diagnosis not present

## 2018-09-27 DIAGNOSIS — E1151 Type 2 diabetes mellitus with diabetic peripheral angiopathy without gangrene: Secondary | ICD-10-CM | POA: Diagnosis not present

## 2018-09-28 DIAGNOSIS — F0280 Dementia in other diseases classified elsewhere without behavioral disturbance: Secondary | ICD-10-CM | POA: Diagnosis not present

## 2018-09-28 DIAGNOSIS — E1151 Type 2 diabetes mellitus with diabetic peripheral angiopathy without gangrene: Secondary | ICD-10-CM | POA: Diagnosis not present

## 2018-09-28 DIAGNOSIS — B952 Enterococcus as the cause of diseases classified elsewhere: Secondary | ICD-10-CM | POA: Diagnosis not present

## 2018-09-28 DIAGNOSIS — I480 Paroxysmal atrial fibrillation: Secondary | ICD-10-CM | POA: Diagnosis not present

## 2018-09-28 DIAGNOSIS — E039 Hypothyroidism, unspecified: Secondary | ICD-10-CM | POA: Diagnosis not present

## 2018-09-28 DIAGNOSIS — N3080 Other cystitis without hematuria: Secondary | ICD-10-CM | POA: Diagnosis not present

## 2018-10-03 DIAGNOSIS — E039 Hypothyroidism, unspecified: Secondary | ICD-10-CM | POA: Diagnosis not present

## 2018-10-03 DIAGNOSIS — F0280 Dementia in other diseases classified elsewhere without behavioral disturbance: Secondary | ICD-10-CM | POA: Diagnosis not present

## 2018-10-03 DIAGNOSIS — N3080 Other cystitis without hematuria: Secondary | ICD-10-CM | POA: Diagnosis not present

## 2018-10-03 DIAGNOSIS — E1151 Type 2 diabetes mellitus with diabetic peripheral angiopathy without gangrene: Secondary | ICD-10-CM | POA: Diagnosis not present

## 2018-10-03 DIAGNOSIS — I480 Paroxysmal atrial fibrillation: Secondary | ICD-10-CM | POA: Diagnosis not present

## 2018-10-03 DIAGNOSIS — B952 Enterococcus as the cause of diseases classified elsewhere: Secondary | ICD-10-CM | POA: Diagnosis not present

## 2018-10-05 DIAGNOSIS — N3080 Other cystitis without hematuria: Secondary | ICD-10-CM | POA: Diagnosis not present

## 2018-10-05 DIAGNOSIS — E1151 Type 2 diabetes mellitus with diabetic peripheral angiopathy without gangrene: Secondary | ICD-10-CM | POA: Diagnosis not present

## 2018-10-05 DIAGNOSIS — F0280 Dementia in other diseases classified elsewhere without behavioral disturbance: Secondary | ICD-10-CM | POA: Diagnosis not present

## 2018-10-05 DIAGNOSIS — B952 Enterococcus as the cause of diseases classified elsewhere: Secondary | ICD-10-CM | POA: Diagnosis not present

## 2018-10-05 DIAGNOSIS — E039 Hypothyroidism, unspecified: Secondary | ICD-10-CM | POA: Diagnosis not present

## 2018-10-05 DIAGNOSIS — I480 Paroxysmal atrial fibrillation: Secondary | ICD-10-CM | POA: Diagnosis not present

## 2018-10-06 DIAGNOSIS — N3080 Other cystitis without hematuria: Secondary | ICD-10-CM | POA: Diagnosis not present

## 2018-10-06 DIAGNOSIS — B952 Enterococcus as the cause of diseases classified elsewhere: Secondary | ICD-10-CM | POA: Diagnosis not present

## 2018-10-06 DIAGNOSIS — I480 Paroxysmal atrial fibrillation: Secondary | ICD-10-CM | POA: Diagnosis not present

## 2018-10-06 DIAGNOSIS — F0280 Dementia in other diseases classified elsewhere without behavioral disturbance: Secondary | ICD-10-CM | POA: Diagnosis not present

## 2018-10-06 DIAGNOSIS — E1151 Type 2 diabetes mellitus with diabetic peripheral angiopathy without gangrene: Secondary | ICD-10-CM | POA: Diagnosis not present

## 2018-10-06 DIAGNOSIS — E039 Hypothyroidism, unspecified: Secondary | ICD-10-CM | POA: Diagnosis not present

## 2018-10-10 DIAGNOSIS — F0280 Dementia in other diseases classified elsewhere without behavioral disturbance: Secondary | ICD-10-CM | POA: Diagnosis not present

## 2018-10-10 DIAGNOSIS — B952 Enterococcus as the cause of diseases classified elsewhere: Secondary | ICD-10-CM | POA: Diagnosis not present

## 2018-10-10 DIAGNOSIS — E039 Hypothyroidism, unspecified: Secondary | ICD-10-CM | POA: Diagnosis not present

## 2018-10-10 DIAGNOSIS — N3080 Other cystitis without hematuria: Secondary | ICD-10-CM | POA: Diagnosis not present

## 2018-10-10 DIAGNOSIS — E1151 Type 2 diabetes mellitus with diabetic peripheral angiopathy without gangrene: Secondary | ICD-10-CM | POA: Diagnosis not present

## 2018-10-10 DIAGNOSIS — I480 Paroxysmal atrial fibrillation: Secondary | ICD-10-CM | POA: Diagnosis not present

## 2018-10-12 DIAGNOSIS — B952 Enterococcus as the cause of diseases classified elsewhere: Secondary | ICD-10-CM | POA: Diagnosis not present

## 2018-10-12 DIAGNOSIS — E039 Hypothyroidism, unspecified: Secondary | ICD-10-CM | POA: Diagnosis not present

## 2018-10-12 DIAGNOSIS — I480 Paroxysmal atrial fibrillation: Secondary | ICD-10-CM | POA: Diagnosis not present

## 2018-10-12 DIAGNOSIS — E1151 Type 2 diabetes mellitus with diabetic peripheral angiopathy without gangrene: Secondary | ICD-10-CM | POA: Diagnosis not present

## 2018-10-12 DIAGNOSIS — F0280 Dementia in other diseases classified elsewhere without behavioral disturbance: Secondary | ICD-10-CM | POA: Diagnosis not present

## 2018-10-12 DIAGNOSIS — N3080 Other cystitis without hematuria: Secondary | ICD-10-CM | POA: Diagnosis not present

## 2018-10-14 DIAGNOSIS — I480 Paroxysmal atrial fibrillation: Secondary | ICD-10-CM | POA: Diagnosis not present

## 2018-10-14 DIAGNOSIS — N3080 Other cystitis without hematuria: Secondary | ICD-10-CM | POA: Diagnosis not present

## 2018-10-14 DIAGNOSIS — E039 Hypothyroidism, unspecified: Secondary | ICD-10-CM | POA: Diagnosis not present

## 2018-10-14 DIAGNOSIS — F0280 Dementia in other diseases classified elsewhere without behavioral disturbance: Secondary | ICD-10-CM | POA: Diagnosis not present

## 2018-10-14 DIAGNOSIS — E1151 Type 2 diabetes mellitus with diabetic peripheral angiopathy without gangrene: Secondary | ICD-10-CM | POA: Diagnosis not present

## 2018-10-14 DIAGNOSIS — B952 Enterococcus as the cause of diseases classified elsewhere: Secondary | ICD-10-CM | POA: Diagnosis not present

## 2018-10-16 DIAGNOSIS — Z8781 Personal history of (healed) traumatic fracture: Secondary | ICD-10-CM | POA: Diagnosis not present

## 2018-10-16 DIAGNOSIS — Z8673 Personal history of transient ischemic attack (TIA), and cerebral infarction without residual deficits: Secondary | ICD-10-CM | POA: Diagnosis not present

## 2018-10-16 DIAGNOSIS — Z951 Presence of aortocoronary bypass graft: Secondary | ICD-10-CM | POA: Diagnosis not present

## 2018-10-16 DIAGNOSIS — J449 Chronic obstructive pulmonary disease, unspecified: Secondary | ICD-10-CM | POA: Diagnosis not present

## 2018-10-16 DIAGNOSIS — Z7901 Long term (current) use of anticoagulants: Secondary | ICD-10-CM | POA: Diagnosis not present

## 2018-10-16 DIAGNOSIS — K3184 Gastroparesis: Secondary | ICD-10-CM | POA: Diagnosis not present

## 2018-10-16 DIAGNOSIS — I712 Thoracic aortic aneurysm, without rupture: Secondary | ICD-10-CM | POA: Diagnosis not present

## 2018-10-16 DIAGNOSIS — N3080 Other cystitis without hematuria: Secondary | ICD-10-CM | POA: Diagnosis not present

## 2018-10-16 DIAGNOSIS — Z9049 Acquired absence of other specified parts of digestive tract: Secondary | ICD-10-CM | POA: Diagnosis not present

## 2018-10-16 DIAGNOSIS — Z7982 Long term (current) use of aspirin: Secondary | ICD-10-CM | POA: Diagnosis not present

## 2018-10-16 DIAGNOSIS — F329 Major depressive disorder, single episode, unspecified: Secondary | ICD-10-CM | POA: Diagnosis not present

## 2018-10-16 DIAGNOSIS — K219 Gastro-esophageal reflux disease without esophagitis: Secondary | ICD-10-CM | POA: Diagnosis not present

## 2018-10-16 DIAGNOSIS — I480 Paroxysmal atrial fibrillation: Secondary | ICD-10-CM | POA: Diagnosis not present

## 2018-10-16 DIAGNOSIS — E1151 Type 2 diabetes mellitus with diabetic peripheral angiopathy without gangrene: Secondary | ICD-10-CM | POA: Diagnosis not present

## 2018-10-16 DIAGNOSIS — E1143 Type 2 diabetes mellitus with diabetic autonomic (poly)neuropathy: Secondary | ICD-10-CM | POA: Diagnosis not present

## 2018-10-16 DIAGNOSIS — I2510 Atherosclerotic heart disease of native coronary artery without angina pectoris: Secondary | ICD-10-CM | POA: Diagnosis not present

## 2018-10-16 DIAGNOSIS — I110 Hypertensive heart disease with heart failure: Secondary | ICD-10-CM | POA: Diagnosis not present

## 2018-10-16 DIAGNOSIS — F0280 Dementia in other diseases classified elsewhere without behavioral disturbance: Secondary | ICD-10-CM | POA: Diagnosis not present

## 2018-10-16 DIAGNOSIS — Z9851 Tubal ligation status: Secondary | ICD-10-CM | POA: Diagnosis not present

## 2018-10-16 DIAGNOSIS — E039 Hypothyroidism, unspecified: Secondary | ICD-10-CM | POA: Diagnosis not present

## 2018-10-16 DIAGNOSIS — M8580 Other specified disorders of bone density and structure, unspecified site: Secondary | ICD-10-CM | POA: Diagnosis not present

## 2018-10-16 DIAGNOSIS — B952 Enterococcus as the cause of diseases classified elsewhere: Secondary | ICD-10-CM | POA: Diagnosis not present

## 2018-10-16 DIAGNOSIS — I5032 Chronic diastolic (congestive) heart failure: Secondary | ICD-10-CM | POA: Diagnosis not present

## 2018-10-16 DIAGNOSIS — E785 Hyperlipidemia, unspecified: Secondary | ICD-10-CM | POA: Diagnosis not present

## 2018-10-17 DIAGNOSIS — F0280 Dementia in other diseases classified elsewhere without behavioral disturbance: Secondary | ICD-10-CM | POA: Diagnosis not present

## 2018-10-17 DIAGNOSIS — B952 Enterococcus as the cause of diseases classified elsewhere: Secondary | ICD-10-CM | POA: Diagnosis not present

## 2018-10-17 DIAGNOSIS — I480 Paroxysmal atrial fibrillation: Secondary | ICD-10-CM | POA: Diagnosis not present

## 2018-10-17 DIAGNOSIS — E1151 Type 2 diabetes mellitus with diabetic peripheral angiopathy without gangrene: Secondary | ICD-10-CM | POA: Diagnosis not present

## 2018-10-17 DIAGNOSIS — N3080 Other cystitis without hematuria: Secondary | ICD-10-CM | POA: Diagnosis not present

## 2018-10-17 DIAGNOSIS — E039 Hypothyroidism, unspecified: Secondary | ICD-10-CM | POA: Diagnosis not present

## 2018-10-20 ENCOUNTER — Other Ambulatory Visit: Payer: Self-pay | Admitting: Family Medicine

## 2018-10-21 DIAGNOSIS — I480 Paroxysmal atrial fibrillation: Secondary | ICD-10-CM | POA: Diagnosis not present

## 2018-10-21 DIAGNOSIS — N3080 Other cystitis without hematuria: Secondary | ICD-10-CM | POA: Diagnosis not present

## 2018-10-21 DIAGNOSIS — E1151 Type 2 diabetes mellitus with diabetic peripheral angiopathy without gangrene: Secondary | ICD-10-CM | POA: Diagnosis not present

## 2018-10-21 DIAGNOSIS — E039 Hypothyroidism, unspecified: Secondary | ICD-10-CM | POA: Diagnosis not present

## 2018-10-21 DIAGNOSIS — F0280 Dementia in other diseases classified elsewhere without behavioral disturbance: Secondary | ICD-10-CM | POA: Diagnosis not present

## 2018-10-21 DIAGNOSIS — B952 Enterococcus as the cause of diseases classified elsewhere: Secondary | ICD-10-CM | POA: Diagnosis not present

## 2018-10-22 DIAGNOSIS — N3080 Other cystitis without hematuria: Secondary | ICD-10-CM | POA: Diagnosis not present

## 2018-10-22 DIAGNOSIS — B952 Enterococcus as the cause of diseases classified elsewhere: Secondary | ICD-10-CM | POA: Diagnosis not present

## 2018-10-22 DIAGNOSIS — F0280 Dementia in other diseases classified elsewhere without behavioral disturbance: Secondary | ICD-10-CM | POA: Diagnosis not present

## 2018-10-22 DIAGNOSIS — I480 Paroxysmal atrial fibrillation: Secondary | ICD-10-CM | POA: Diagnosis not present

## 2018-10-22 DIAGNOSIS — E1151 Type 2 diabetes mellitus with diabetic peripheral angiopathy without gangrene: Secondary | ICD-10-CM | POA: Diagnosis not present

## 2018-10-22 DIAGNOSIS — E039 Hypothyroidism, unspecified: Secondary | ICD-10-CM | POA: Diagnosis not present

## 2018-10-25 ENCOUNTER — Ambulatory Visit: Payer: Medicare Other | Admitting: Cardiovascular Disease

## 2018-10-26 DIAGNOSIS — E1151 Type 2 diabetes mellitus with diabetic peripheral angiopathy without gangrene: Secondary | ICD-10-CM | POA: Diagnosis not present

## 2018-10-26 DIAGNOSIS — B952 Enterococcus as the cause of diseases classified elsewhere: Secondary | ICD-10-CM | POA: Diagnosis not present

## 2018-10-26 DIAGNOSIS — N3080 Other cystitis without hematuria: Secondary | ICD-10-CM | POA: Diagnosis not present

## 2018-10-26 DIAGNOSIS — I480 Paroxysmal atrial fibrillation: Secondary | ICD-10-CM | POA: Diagnosis not present

## 2018-10-26 DIAGNOSIS — F0280 Dementia in other diseases classified elsewhere without behavioral disturbance: Secondary | ICD-10-CM | POA: Diagnosis not present

## 2018-10-26 DIAGNOSIS — E039 Hypothyroidism, unspecified: Secondary | ICD-10-CM | POA: Diagnosis not present

## 2018-10-28 DIAGNOSIS — B952 Enterococcus as the cause of diseases classified elsewhere: Secondary | ICD-10-CM | POA: Diagnosis not present

## 2018-10-28 DIAGNOSIS — E1151 Type 2 diabetes mellitus with diabetic peripheral angiopathy without gangrene: Secondary | ICD-10-CM | POA: Diagnosis not present

## 2018-10-28 DIAGNOSIS — N3080 Other cystitis without hematuria: Secondary | ICD-10-CM | POA: Diagnosis not present

## 2018-10-28 DIAGNOSIS — I480 Paroxysmal atrial fibrillation: Secondary | ICD-10-CM | POA: Diagnosis not present

## 2018-10-28 DIAGNOSIS — F0280 Dementia in other diseases classified elsewhere without behavioral disturbance: Secondary | ICD-10-CM | POA: Diagnosis not present

## 2018-10-28 DIAGNOSIS — E039 Hypothyroidism, unspecified: Secondary | ICD-10-CM | POA: Diagnosis not present

## 2018-10-31 DIAGNOSIS — B952 Enterococcus as the cause of diseases classified elsewhere: Secondary | ICD-10-CM | POA: Diagnosis not present

## 2018-10-31 DIAGNOSIS — F0280 Dementia in other diseases classified elsewhere without behavioral disturbance: Secondary | ICD-10-CM | POA: Diagnosis not present

## 2018-10-31 DIAGNOSIS — N3080 Other cystitis without hematuria: Secondary | ICD-10-CM | POA: Diagnosis not present

## 2018-10-31 DIAGNOSIS — I480 Paroxysmal atrial fibrillation: Secondary | ICD-10-CM | POA: Diagnosis not present

## 2018-10-31 DIAGNOSIS — E1151 Type 2 diabetes mellitus with diabetic peripheral angiopathy without gangrene: Secondary | ICD-10-CM | POA: Diagnosis not present

## 2018-10-31 DIAGNOSIS — E039 Hypothyroidism, unspecified: Secondary | ICD-10-CM | POA: Diagnosis not present

## 2018-11-02 DIAGNOSIS — N3080 Other cystitis without hematuria: Secondary | ICD-10-CM | POA: Diagnosis not present

## 2018-11-02 DIAGNOSIS — F0280 Dementia in other diseases classified elsewhere without behavioral disturbance: Secondary | ICD-10-CM | POA: Diagnosis not present

## 2018-11-02 DIAGNOSIS — E1151 Type 2 diabetes mellitus with diabetic peripheral angiopathy without gangrene: Secondary | ICD-10-CM | POA: Diagnosis not present

## 2018-11-02 DIAGNOSIS — I480 Paroxysmal atrial fibrillation: Secondary | ICD-10-CM | POA: Diagnosis not present

## 2018-11-02 DIAGNOSIS — E039 Hypothyroidism, unspecified: Secondary | ICD-10-CM | POA: Diagnosis not present

## 2018-11-02 DIAGNOSIS — B952 Enterococcus as the cause of diseases classified elsewhere: Secondary | ICD-10-CM | POA: Diagnosis not present

## 2018-11-11 DIAGNOSIS — F0280 Dementia in other diseases classified elsewhere without behavioral disturbance: Secondary | ICD-10-CM | POA: Diagnosis not present

## 2018-11-11 DIAGNOSIS — E1151 Type 2 diabetes mellitus with diabetic peripheral angiopathy without gangrene: Secondary | ICD-10-CM | POA: Diagnosis not present

## 2018-11-11 DIAGNOSIS — E039 Hypothyroidism, unspecified: Secondary | ICD-10-CM | POA: Diagnosis not present

## 2018-11-11 DIAGNOSIS — B952 Enterococcus as the cause of diseases classified elsewhere: Secondary | ICD-10-CM | POA: Diagnosis not present

## 2018-11-11 DIAGNOSIS — N3080 Other cystitis without hematuria: Secondary | ICD-10-CM | POA: Diagnosis not present

## 2018-11-11 DIAGNOSIS — I480 Paroxysmal atrial fibrillation: Secondary | ICD-10-CM | POA: Diagnosis not present

## 2018-11-15 DIAGNOSIS — Z7901 Long term (current) use of anticoagulants: Secondary | ICD-10-CM | POA: Diagnosis not present

## 2018-11-15 DIAGNOSIS — E785 Hyperlipidemia, unspecified: Secondary | ICD-10-CM | POA: Diagnosis not present

## 2018-11-15 DIAGNOSIS — Z9851 Tubal ligation status: Secondary | ICD-10-CM | POA: Diagnosis not present

## 2018-11-15 DIAGNOSIS — I110 Hypertensive heart disease with heart failure: Secondary | ICD-10-CM | POA: Diagnosis not present

## 2018-11-15 DIAGNOSIS — Z8673 Personal history of transient ischemic attack (TIA), and cerebral infarction without residual deficits: Secondary | ICD-10-CM | POA: Diagnosis not present

## 2018-11-15 DIAGNOSIS — Z8781 Personal history of (healed) traumatic fracture: Secondary | ICD-10-CM | POA: Diagnosis not present

## 2018-11-15 DIAGNOSIS — F0280 Dementia in other diseases classified elsewhere without behavioral disturbance: Secondary | ICD-10-CM | POA: Diagnosis not present

## 2018-11-15 DIAGNOSIS — F329 Major depressive disorder, single episode, unspecified: Secondary | ICD-10-CM | POA: Diagnosis not present

## 2018-11-15 DIAGNOSIS — K219 Gastro-esophageal reflux disease without esophagitis: Secondary | ICD-10-CM | POA: Diagnosis not present

## 2018-11-15 DIAGNOSIS — Z9049 Acquired absence of other specified parts of digestive tract: Secondary | ICD-10-CM | POA: Diagnosis not present

## 2018-11-15 DIAGNOSIS — E039 Hypothyroidism, unspecified: Secondary | ICD-10-CM | POA: Diagnosis not present

## 2018-11-15 DIAGNOSIS — Z7984 Long term (current) use of oral hypoglycemic drugs: Secondary | ICD-10-CM | POA: Diagnosis not present

## 2018-11-15 DIAGNOSIS — E1143 Type 2 diabetes mellitus with diabetic autonomic (poly)neuropathy: Secondary | ICD-10-CM | POA: Diagnosis not present

## 2018-11-15 DIAGNOSIS — I2510 Atherosclerotic heart disease of native coronary artery without angina pectoris: Secondary | ICD-10-CM | POA: Diagnosis not present

## 2018-11-15 DIAGNOSIS — J449 Chronic obstructive pulmonary disease, unspecified: Secondary | ICD-10-CM | POA: Diagnosis not present

## 2018-11-15 DIAGNOSIS — K3184 Gastroparesis: Secondary | ICD-10-CM | POA: Diagnosis not present

## 2018-11-15 DIAGNOSIS — I480 Paroxysmal atrial fibrillation: Secondary | ICD-10-CM | POA: Diagnosis not present

## 2018-11-15 DIAGNOSIS — Z951 Presence of aortocoronary bypass graft: Secondary | ICD-10-CM | POA: Diagnosis not present

## 2018-11-15 DIAGNOSIS — E1151 Type 2 diabetes mellitus with diabetic peripheral angiopathy without gangrene: Secondary | ICD-10-CM | POA: Diagnosis not present

## 2018-11-15 DIAGNOSIS — I5032 Chronic diastolic (congestive) heart failure: Secondary | ICD-10-CM | POA: Diagnosis not present

## 2018-11-15 DIAGNOSIS — I712 Thoracic aortic aneurysm, without rupture: Secondary | ICD-10-CM | POA: Diagnosis not present

## 2018-11-15 DIAGNOSIS — Z7982 Long term (current) use of aspirin: Secondary | ICD-10-CM | POA: Diagnosis not present

## 2018-11-15 DIAGNOSIS — M8580 Other specified disorders of bone density and structure, unspecified site: Secondary | ICD-10-CM | POA: Diagnosis not present

## 2018-11-17 ENCOUNTER — Other Ambulatory Visit: Payer: Self-pay | Admitting: Family Medicine

## 2018-11-18 DIAGNOSIS — I5032 Chronic diastolic (congestive) heart failure: Secondary | ICD-10-CM | POA: Diagnosis not present

## 2018-11-18 DIAGNOSIS — E1151 Type 2 diabetes mellitus with diabetic peripheral angiopathy without gangrene: Secondary | ICD-10-CM | POA: Diagnosis not present

## 2018-11-18 DIAGNOSIS — J449 Chronic obstructive pulmonary disease, unspecified: Secondary | ICD-10-CM | POA: Diagnosis not present

## 2018-11-18 DIAGNOSIS — I2510 Atherosclerotic heart disease of native coronary artery without angina pectoris: Secondary | ICD-10-CM | POA: Diagnosis not present

## 2018-11-18 DIAGNOSIS — I110 Hypertensive heart disease with heart failure: Secondary | ICD-10-CM | POA: Diagnosis not present

## 2018-11-18 DIAGNOSIS — I480 Paroxysmal atrial fibrillation: Secondary | ICD-10-CM | POA: Diagnosis not present

## 2018-11-25 DIAGNOSIS — I2510 Atherosclerotic heart disease of native coronary artery without angina pectoris: Secondary | ICD-10-CM | POA: Diagnosis not present

## 2018-11-25 DIAGNOSIS — E1151 Type 2 diabetes mellitus with diabetic peripheral angiopathy without gangrene: Secondary | ICD-10-CM | POA: Diagnosis not present

## 2018-11-25 DIAGNOSIS — I110 Hypertensive heart disease with heart failure: Secondary | ICD-10-CM | POA: Diagnosis not present

## 2018-11-25 DIAGNOSIS — I5032 Chronic diastolic (congestive) heart failure: Secondary | ICD-10-CM | POA: Diagnosis not present

## 2018-11-25 DIAGNOSIS — J449 Chronic obstructive pulmonary disease, unspecified: Secondary | ICD-10-CM | POA: Diagnosis not present

## 2018-11-25 DIAGNOSIS — I480 Paroxysmal atrial fibrillation: Secondary | ICD-10-CM | POA: Diagnosis not present

## 2018-11-30 DIAGNOSIS — I5032 Chronic diastolic (congestive) heart failure: Secondary | ICD-10-CM | POA: Diagnosis not present

## 2018-11-30 DIAGNOSIS — I2510 Atherosclerotic heart disease of native coronary artery without angina pectoris: Secondary | ICD-10-CM | POA: Diagnosis not present

## 2018-11-30 DIAGNOSIS — I110 Hypertensive heart disease with heart failure: Secondary | ICD-10-CM | POA: Diagnosis not present

## 2018-11-30 DIAGNOSIS — J449 Chronic obstructive pulmonary disease, unspecified: Secondary | ICD-10-CM | POA: Diagnosis not present

## 2018-11-30 DIAGNOSIS — E1151 Type 2 diabetes mellitus with diabetic peripheral angiopathy without gangrene: Secondary | ICD-10-CM | POA: Diagnosis not present

## 2018-11-30 DIAGNOSIS — I480 Paroxysmal atrial fibrillation: Secondary | ICD-10-CM | POA: Diagnosis not present

## 2018-12-07 DIAGNOSIS — I5032 Chronic diastolic (congestive) heart failure: Secondary | ICD-10-CM | POA: Diagnosis not present

## 2018-12-07 DIAGNOSIS — I2510 Atherosclerotic heart disease of native coronary artery without angina pectoris: Secondary | ICD-10-CM | POA: Diagnosis not present

## 2018-12-07 DIAGNOSIS — E1151 Type 2 diabetes mellitus with diabetic peripheral angiopathy without gangrene: Secondary | ICD-10-CM | POA: Diagnosis not present

## 2018-12-07 DIAGNOSIS — I110 Hypertensive heart disease with heart failure: Secondary | ICD-10-CM | POA: Diagnosis not present

## 2018-12-07 DIAGNOSIS — J449 Chronic obstructive pulmonary disease, unspecified: Secondary | ICD-10-CM | POA: Diagnosis not present

## 2018-12-07 DIAGNOSIS — I480 Paroxysmal atrial fibrillation: Secondary | ICD-10-CM | POA: Diagnosis not present

## 2018-12-08 DIAGNOSIS — I5032 Chronic diastolic (congestive) heart failure: Secondary | ICD-10-CM | POA: Diagnosis not present

## 2018-12-08 DIAGNOSIS — I480 Paroxysmal atrial fibrillation: Secondary | ICD-10-CM | POA: Diagnosis not present

## 2018-12-08 DIAGNOSIS — I2510 Atherosclerotic heart disease of native coronary artery without angina pectoris: Secondary | ICD-10-CM | POA: Diagnosis not present

## 2018-12-08 DIAGNOSIS — E1151 Type 2 diabetes mellitus with diabetic peripheral angiopathy without gangrene: Secondary | ICD-10-CM | POA: Diagnosis not present

## 2018-12-08 DIAGNOSIS — I110 Hypertensive heart disease with heart failure: Secondary | ICD-10-CM | POA: Diagnosis not present

## 2018-12-08 DIAGNOSIS — J449 Chronic obstructive pulmonary disease, unspecified: Secondary | ICD-10-CM | POA: Diagnosis not present

## 2018-12-15 DIAGNOSIS — Z7982 Long term (current) use of aspirin: Secondary | ICD-10-CM | POA: Diagnosis not present

## 2018-12-15 DIAGNOSIS — I5032 Chronic diastolic (congestive) heart failure: Secondary | ICD-10-CM | POA: Diagnosis not present

## 2018-12-15 DIAGNOSIS — E039 Hypothyroidism, unspecified: Secondary | ICD-10-CM | POA: Diagnosis not present

## 2018-12-15 DIAGNOSIS — Z8781 Personal history of (healed) traumatic fracture: Secondary | ICD-10-CM | POA: Diagnosis not present

## 2018-12-15 DIAGNOSIS — F329 Major depressive disorder, single episode, unspecified: Secondary | ICD-10-CM | POA: Diagnosis not present

## 2018-12-15 DIAGNOSIS — Z9851 Tubal ligation status: Secondary | ICD-10-CM | POA: Diagnosis not present

## 2018-12-15 DIAGNOSIS — M8580 Other specified disorders of bone density and structure, unspecified site: Secondary | ICD-10-CM | POA: Diagnosis not present

## 2018-12-15 DIAGNOSIS — I480 Paroxysmal atrial fibrillation: Secondary | ICD-10-CM | POA: Diagnosis not present

## 2018-12-15 DIAGNOSIS — E1151 Type 2 diabetes mellitus with diabetic peripheral angiopathy without gangrene: Secondary | ICD-10-CM | POA: Diagnosis not present

## 2018-12-15 DIAGNOSIS — E1143 Type 2 diabetes mellitus with diabetic autonomic (poly)neuropathy: Secondary | ICD-10-CM | POA: Diagnosis not present

## 2018-12-15 DIAGNOSIS — Z8673 Personal history of transient ischemic attack (TIA), and cerebral infarction without residual deficits: Secondary | ICD-10-CM | POA: Diagnosis not present

## 2018-12-15 DIAGNOSIS — I110 Hypertensive heart disease with heart failure: Secondary | ICD-10-CM | POA: Diagnosis not present

## 2018-12-15 DIAGNOSIS — Z7984 Long term (current) use of oral hypoglycemic drugs: Secondary | ICD-10-CM | POA: Diagnosis not present

## 2018-12-15 DIAGNOSIS — I712 Thoracic aortic aneurysm, without rupture: Secondary | ICD-10-CM | POA: Diagnosis not present

## 2018-12-15 DIAGNOSIS — K3184 Gastroparesis: Secondary | ICD-10-CM | POA: Diagnosis not present

## 2018-12-15 DIAGNOSIS — Z951 Presence of aortocoronary bypass graft: Secondary | ICD-10-CM | POA: Diagnosis not present

## 2018-12-15 DIAGNOSIS — E785 Hyperlipidemia, unspecified: Secondary | ICD-10-CM | POA: Diagnosis not present

## 2018-12-15 DIAGNOSIS — Z7901 Long term (current) use of anticoagulants: Secondary | ICD-10-CM | POA: Diagnosis not present

## 2018-12-15 DIAGNOSIS — K219 Gastro-esophageal reflux disease without esophagitis: Secondary | ICD-10-CM | POA: Diagnosis not present

## 2018-12-15 DIAGNOSIS — I2510 Atherosclerotic heart disease of native coronary artery without angina pectoris: Secondary | ICD-10-CM | POA: Diagnosis not present

## 2018-12-15 DIAGNOSIS — Z9049 Acquired absence of other specified parts of digestive tract: Secondary | ICD-10-CM | POA: Diagnosis not present

## 2018-12-15 DIAGNOSIS — F0280 Dementia in other diseases classified elsewhere without behavioral disturbance: Secondary | ICD-10-CM | POA: Diagnosis not present

## 2018-12-15 DIAGNOSIS — J449 Chronic obstructive pulmonary disease, unspecified: Secondary | ICD-10-CM | POA: Diagnosis not present

## 2018-12-22 DIAGNOSIS — J449 Chronic obstructive pulmonary disease, unspecified: Secondary | ICD-10-CM | POA: Diagnosis not present

## 2018-12-22 DIAGNOSIS — I110 Hypertensive heart disease with heart failure: Secondary | ICD-10-CM | POA: Diagnosis not present

## 2018-12-22 DIAGNOSIS — I5032 Chronic diastolic (congestive) heart failure: Secondary | ICD-10-CM | POA: Diagnosis not present

## 2018-12-22 DIAGNOSIS — E1151 Type 2 diabetes mellitus with diabetic peripheral angiopathy without gangrene: Secondary | ICD-10-CM | POA: Diagnosis not present

## 2018-12-22 DIAGNOSIS — I2510 Atherosclerotic heart disease of native coronary artery without angina pectoris: Secondary | ICD-10-CM | POA: Diagnosis not present

## 2018-12-22 DIAGNOSIS — I480 Paroxysmal atrial fibrillation: Secondary | ICD-10-CM | POA: Diagnosis not present

## 2018-12-28 DIAGNOSIS — I252 Old myocardial infarction: Secondary | ICD-10-CM | POA: Diagnosis not present

## 2018-12-28 DIAGNOSIS — I10 Essential (primary) hypertension: Secondary | ICD-10-CM | POA: Diagnosis not present

## 2018-12-28 DIAGNOSIS — N3289 Other specified disorders of bladder: Secondary | ICD-10-CM | POA: Diagnosis not present

## 2018-12-28 DIAGNOSIS — E785 Hyperlipidemia, unspecified: Secondary | ICD-10-CM | POA: Diagnosis not present

## 2018-12-28 DIAGNOSIS — Z7901 Long term (current) use of anticoagulants: Secondary | ICD-10-CM | POA: Diagnosis not present

## 2018-12-28 DIAGNOSIS — I482 Chronic atrial fibrillation, unspecified: Secondary | ICD-10-CM | POA: Diagnosis not present

## 2018-12-28 DIAGNOSIS — Z792 Long term (current) use of antibiotics: Secondary | ICD-10-CM | POA: Diagnosis not present

## 2018-12-28 DIAGNOSIS — Z79899 Other long term (current) drug therapy: Secondary | ICD-10-CM | POA: Diagnosis not present

## 2018-12-28 DIAGNOSIS — Z7984 Long term (current) use of oral hypoglycemic drugs: Secondary | ICD-10-CM | POA: Diagnosis not present

## 2018-12-28 DIAGNOSIS — E119 Type 2 diabetes mellitus without complications: Secondary | ICD-10-CM | POA: Diagnosis not present

## 2018-12-28 DIAGNOSIS — Z8673 Personal history of transient ischemic attack (TIA), and cerebral infarction without residual deficits: Secondary | ICD-10-CM | POA: Diagnosis not present

## 2018-12-28 DIAGNOSIS — F0390 Unspecified dementia without behavioral disturbance: Secondary | ICD-10-CM | POA: Diagnosis not present

## 2018-12-28 DIAGNOSIS — J168 Pneumonia due to other specified infectious organisms: Secondary | ICD-10-CM | POA: Diagnosis not present

## 2018-12-28 DIAGNOSIS — R509 Fever, unspecified: Secondary | ICD-10-CM | POA: Diagnosis not present

## 2018-12-28 DIAGNOSIS — R112 Nausea with vomiting, unspecified: Secondary | ICD-10-CM | POA: Diagnosis not present

## 2018-12-28 DIAGNOSIS — J449 Chronic obstructive pulmonary disease, unspecified: Secondary | ICD-10-CM | POA: Diagnosis not present

## 2018-12-28 DIAGNOSIS — I2510 Atherosclerotic heart disease of native coronary artery without angina pectoris: Secondary | ICD-10-CM | POA: Diagnosis not present

## 2018-12-28 DIAGNOSIS — K429 Umbilical hernia without obstruction or gangrene: Secondary | ICD-10-CM | POA: Diagnosis not present

## 2018-12-28 DIAGNOSIS — Z888 Allergy status to other drugs, medicaments and biological substances status: Secondary | ICD-10-CM | POA: Diagnosis not present

## 2018-12-28 DIAGNOSIS — R918 Other nonspecific abnormal finding of lung field: Secondary | ICD-10-CM | POA: Diagnosis not present

## 2018-12-28 DIAGNOSIS — R9431 Abnormal electrocardiogram [ECG] [EKG]: Secondary | ICD-10-CM | POA: Diagnosis not present

## 2018-12-28 DIAGNOSIS — Z8679 Personal history of other diseases of the circulatory system: Secondary | ICD-10-CM | POA: Diagnosis not present

## 2018-12-28 DIAGNOSIS — Z20828 Contact with and (suspected) exposure to other viral communicable diseases: Secondary | ICD-10-CM | POA: Diagnosis not present

## 2018-12-28 DIAGNOSIS — Z955 Presence of coronary angioplasty implant and graft: Secondary | ICD-10-CM | POA: Diagnosis not present

## 2018-12-28 DIAGNOSIS — Z9049 Acquired absence of other specified parts of digestive tract: Secondary | ICD-10-CM | POA: Diagnosis not present

## 2018-12-28 DIAGNOSIS — E039 Hypothyroidism, unspecified: Secondary | ICD-10-CM | POA: Diagnosis not present

## 2018-12-28 DIAGNOSIS — Z951 Presence of aortocoronary bypass graft: Secondary | ICD-10-CM | POA: Diagnosis not present

## 2018-12-28 DIAGNOSIS — Z7982 Long term (current) use of aspirin: Secondary | ICD-10-CM | POA: Diagnosis not present

## 2018-12-29 DIAGNOSIS — R509 Fever, unspecified: Secondary | ICD-10-CM | POA: Diagnosis not present

## 2018-12-29 DIAGNOSIS — R918 Other nonspecific abnormal finding of lung field: Secondary | ICD-10-CM | POA: Diagnosis not present

## 2018-12-29 DIAGNOSIS — I5033 Acute on chronic diastolic (congestive) heart failure: Secondary | ICD-10-CM | POA: Diagnosis not present

## 2018-12-29 DIAGNOSIS — N3289 Other specified disorders of bladder: Secondary | ICD-10-CM | POA: Diagnosis not present

## 2018-12-29 DIAGNOSIS — I10 Essential (primary) hypertension: Secondary | ICD-10-CM | POA: Diagnosis not present

## 2018-12-29 DIAGNOSIS — K429 Umbilical hernia without obstruction or gangrene: Secondary | ICD-10-CM | POA: Diagnosis not present

## 2018-12-29 DIAGNOSIS — E785 Hyperlipidemia, unspecified: Secondary | ICD-10-CM | POA: Diagnosis not present

## 2018-12-29 DIAGNOSIS — E039 Hypothyroidism, unspecified: Secondary | ICD-10-CM | POA: Diagnosis not present

## 2018-12-29 DIAGNOSIS — J449 Chronic obstructive pulmonary disease, unspecified: Secondary | ICD-10-CM | POA: Diagnosis not present

## 2018-12-29 DIAGNOSIS — I2510 Atherosclerotic heart disease of native coronary artery without angina pectoris: Secondary | ICD-10-CM | POA: Diagnosis not present

## 2018-12-30 ENCOUNTER — Other Ambulatory Visit: Payer: Self-pay | Admitting: Family Medicine

## 2018-12-30 DIAGNOSIS — R9389 Abnormal findings on diagnostic imaging of other specified body structures: Secondary | ICD-10-CM | POA: Diagnosis not present

## 2018-12-30 DIAGNOSIS — I10 Essential (primary) hypertension: Secondary | ICD-10-CM | POA: Diagnosis not present

## 2018-12-30 DIAGNOSIS — I2510 Atherosclerotic heart disease of native coronary artery without angina pectoris: Secondary | ICD-10-CM | POA: Diagnosis not present

## 2018-12-30 DIAGNOSIS — J449 Chronic obstructive pulmonary disease, unspecified: Secondary | ICD-10-CM | POA: Diagnosis not present

## 2018-12-30 DIAGNOSIS — E785 Hyperlipidemia, unspecified: Secondary | ICD-10-CM | POA: Diagnosis not present

## 2018-12-30 DIAGNOSIS — E119 Type 2 diabetes mellitus without complications: Secondary | ICD-10-CM | POA: Diagnosis not present

## 2018-12-30 DIAGNOSIS — R509 Fever, unspecified: Secondary | ICD-10-CM | POA: Diagnosis not present

## 2018-12-30 DIAGNOSIS — I482 Chronic atrial fibrillation, unspecified: Secondary | ICD-10-CM | POA: Diagnosis not present

## 2018-12-30 DIAGNOSIS — E039 Hypothyroidism, unspecified: Secondary | ICD-10-CM | POA: Diagnosis not present

## 2018-12-30 DIAGNOSIS — J189 Pneumonia, unspecified organism: Secondary | ICD-10-CM | POA: Diagnosis not present

## 2018-12-31 MED ORDER — ALBUTEROL SULFATE (2.5 MG/3ML) 0.083% IN NEBU
2.50 | INHALATION_SOLUTION | RESPIRATORY_TRACT | Status: DC
Start: ? — End: 2018-12-31

## 2018-12-31 MED ORDER — GUAIFENESIN 100 MG/5ML PO LIQD
200.00 | ORAL | Status: DC
Start: ? — End: 2018-12-31

## 2018-12-31 MED ORDER — DONEPEZIL HCL 5 MG PO TABS
10.00 | ORAL_TABLET | ORAL | Status: DC
Start: 2018-12-30 — End: 2018-12-31

## 2018-12-31 MED ORDER — POLYETHYLENE GLYCOL 3350 17 G PO PACK
17.00 | PACK | ORAL | Status: DC
Start: ? — End: 2018-12-31

## 2018-12-31 MED ORDER — ESCITALOPRAM OXALATE 10 MG PO TABS
10.00 | ORAL_TABLET | ORAL | Status: DC
Start: 2018-12-31 — End: 2018-12-31

## 2018-12-31 MED ORDER — LOSARTAN POTASSIUM 50 MG PO TABS
50.00 | ORAL_TABLET | ORAL | Status: DC
Start: 2018-12-31 — End: 2018-12-31

## 2018-12-31 MED ORDER — GENERIC EXTERNAL MEDICATION
Status: DC
Start: ? — End: 2018-12-31

## 2018-12-31 MED ORDER — METOPROLOL SUCCINATE ER 25 MG PO TB24
25.00 | ORAL_TABLET | ORAL | Status: DC
Start: 2018-12-31 — End: 2018-12-31

## 2018-12-31 MED ORDER — MEMANTINE HCL 5 MG PO TABS
10.00 | ORAL_TABLET | ORAL | Status: DC
Start: 2018-12-30 — End: 2018-12-31

## 2018-12-31 MED ORDER — RIVAROXABAN 20 MG PO TABS
20.00 | ORAL_TABLET | ORAL | Status: DC
Start: 2018-12-31 — End: 2018-12-31

## 2018-12-31 MED ORDER — ASPIRIN EC 81 MG PO TBEC
81.00 | DELAYED_RELEASE_TABLET | ORAL | Status: DC
Start: 2018-12-31 — End: 2018-12-31

## 2018-12-31 MED ORDER — ALUM & MAG HYDROXIDE-SIMETH 200-200-20 MG/5ML PO SUSP
30.00 | ORAL | Status: DC
Start: ? — End: 2018-12-31

## 2018-12-31 MED ORDER — GENERIC EXTERNAL MEDICATION
1.00 | Status: DC
Start: 2018-12-31 — End: 2018-12-31

## 2018-12-31 MED ORDER — SODIUM CHLORIDE 0.9 % IV SOLN
10.00 | INTRAVENOUS | Status: DC
Start: ? — End: 2018-12-31

## 2018-12-31 MED ORDER — HYDRALAZINE HCL 25 MG PO TABS
25.00 | ORAL_TABLET | ORAL | Status: DC
Start: ? — End: 2018-12-31

## 2018-12-31 MED ORDER — NITROGLYCERIN 0.4 MG SL SUBL
0.40 | SUBLINGUAL_TABLET | SUBLINGUAL | Status: DC
Start: ? — End: 2018-12-31

## 2018-12-31 MED ORDER — DOXYCYCLINE MONOHYDRATE 100 MG PO CAPS
100.00 | ORAL_CAPSULE | ORAL | Status: DC
Start: 2018-12-30 — End: 2018-12-31

## 2018-12-31 MED ORDER — LEVOTHYROXINE SODIUM 112 MCG PO TABS
56.00 | ORAL_TABLET | ORAL | Status: DC
Start: 2018-12-31 — End: 2018-12-31

## 2018-12-31 MED ORDER — AMLODIPINE BESYLATE 5 MG PO TABS
5.00 | ORAL_TABLET | ORAL | Status: DC
Start: 2018-12-31 — End: 2018-12-31

## 2018-12-31 MED ORDER — ATORVASTATIN CALCIUM 40 MG PO TABS
40.00 | ORAL_TABLET | ORAL | Status: DC
Start: 2018-12-30 — End: 2018-12-31

## 2019-01-04 DIAGNOSIS — I480 Paroxysmal atrial fibrillation: Secondary | ICD-10-CM | POA: Diagnosis not present

## 2019-01-04 DIAGNOSIS — J449 Chronic obstructive pulmonary disease, unspecified: Secondary | ICD-10-CM | POA: Diagnosis not present

## 2019-01-04 DIAGNOSIS — I110 Hypertensive heart disease with heart failure: Secondary | ICD-10-CM | POA: Diagnosis not present

## 2019-01-04 DIAGNOSIS — I5032 Chronic diastolic (congestive) heart failure: Secondary | ICD-10-CM | POA: Diagnosis not present

## 2019-01-04 DIAGNOSIS — I2510 Atherosclerotic heart disease of native coronary artery without angina pectoris: Secondary | ICD-10-CM | POA: Diagnosis not present

## 2019-01-04 DIAGNOSIS — E1151 Type 2 diabetes mellitus with diabetic peripheral angiopathy without gangrene: Secondary | ICD-10-CM | POA: Diagnosis not present

## 2019-01-06 DIAGNOSIS — J449 Chronic obstructive pulmonary disease, unspecified: Secondary | ICD-10-CM | POA: Diagnosis not present

## 2019-01-06 DIAGNOSIS — E1151 Type 2 diabetes mellitus with diabetic peripheral angiopathy without gangrene: Secondary | ICD-10-CM | POA: Diagnosis not present

## 2019-01-06 DIAGNOSIS — I2510 Atherosclerotic heart disease of native coronary artery without angina pectoris: Secondary | ICD-10-CM | POA: Diagnosis not present

## 2019-01-06 DIAGNOSIS — I480 Paroxysmal atrial fibrillation: Secondary | ICD-10-CM | POA: Diagnosis not present

## 2019-01-06 DIAGNOSIS — I5032 Chronic diastolic (congestive) heart failure: Secondary | ICD-10-CM | POA: Diagnosis not present

## 2019-01-06 DIAGNOSIS — I110 Hypertensive heart disease with heart failure: Secondary | ICD-10-CM | POA: Diagnosis not present

## 2019-01-09 DIAGNOSIS — E1151 Type 2 diabetes mellitus with diabetic peripheral angiopathy without gangrene: Secondary | ICD-10-CM | POA: Diagnosis not present

## 2019-01-09 DIAGNOSIS — I110 Hypertensive heart disease with heart failure: Secondary | ICD-10-CM | POA: Diagnosis not present

## 2019-01-09 DIAGNOSIS — I480 Paroxysmal atrial fibrillation: Secondary | ICD-10-CM | POA: Diagnosis not present

## 2019-01-09 DIAGNOSIS — J449 Chronic obstructive pulmonary disease, unspecified: Secondary | ICD-10-CM | POA: Diagnosis not present

## 2019-01-09 DIAGNOSIS — I5032 Chronic diastolic (congestive) heart failure: Secondary | ICD-10-CM | POA: Diagnosis not present

## 2019-01-09 DIAGNOSIS — I2510 Atherosclerotic heart disease of native coronary artery without angina pectoris: Secondary | ICD-10-CM | POA: Diagnosis not present

## 2019-01-10 ENCOUNTER — Telehealth: Payer: Self-pay | Admitting: *Deleted

## 2019-01-10 ENCOUNTER — Inpatient Hospital Stay: Payer: Medicare Other | Admitting: Family Medicine

## 2019-01-10 NOTE — Telephone Encounter (Signed)
Mrs. Stanhope, daughter will call us back.

## 2019-01-11 DIAGNOSIS — I480 Paroxysmal atrial fibrillation: Secondary | ICD-10-CM | POA: Diagnosis not present

## 2019-01-11 DIAGNOSIS — E1151 Type 2 diabetes mellitus with diabetic peripheral angiopathy without gangrene: Secondary | ICD-10-CM | POA: Diagnosis not present

## 2019-01-11 DIAGNOSIS — I110 Hypertensive heart disease with heart failure: Secondary | ICD-10-CM | POA: Diagnosis not present

## 2019-01-11 DIAGNOSIS — J449 Chronic obstructive pulmonary disease, unspecified: Secondary | ICD-10-CM | POA: Diagnosis not present

## 2019-01-11 DIAGNOSIS — I2510 Atherosclerotic heart disease of native coronary artery without angina pectoris: Secondary | ICD-10-CM | POA: Diagnosis not present

## 2019-01-11 DIAGNOSIS — I5032 Chronic diastolic (congestive) heart failure: Secondary | ICD-10-CM | POA: Diagnosis not present

## 2019-01-13 ENCOUNTER — Other Ambulatory Visit: Payer: Self-pay

## 2019-01-13 ENCOUNTER — Ambulatory Visit (INDEPENDENT_AMBULATORY_CARE_PROVIDER_SITE_OTHER): Payer: Medicare Other | Admitting: Family Medicine

## 2019-01-13 ENCOUNTER — Encounter: Payer: Self-pay | Admitting: Family Medicine

## 2019-01-13 VITALS — BP 100/52 | HR 70 | Temp 97.7°F | Resp 16 | Ht 69.0 in | Wt 184.0 lb

## 2019-01-13 DIAGNOSIS — J449 Chronic obstructive pulmonary disease, unspecified: Secondary | ICD-10-CM | POA: Diagnosis not present

## 2019-01-13 DIAGNOSIS — E1165 Type 2 diabetes mellitus with hyperglycemia: Secondary | ICD-10-CM | POA: Diagnosis not present

## 2019-01-13 DIAGNOSIS — IMO0001 Reserved for inherently not codable concepts without codable children: Secondary | ICD-10-CM

## 2019-01-13 DIAGNOSIS — Z09 Encounter for follow-up examination after completed treatment for conditions other than malignant neoplasm: Secondary | ICD-10-CM | POA: Diagnosis not present

## 2019-01-13 DIAGNOSIS — E1151 Type 2 diabetes mellitus with diabetic peripheral angiopathy without gangrene: Secondary | ICD-10-CM | POA: Diagnosis not present

## 2019-01-13 DIAGNOSIS — I110 Hypertensive heart disease with heart failure: Secondary | ICD-10-CM | POA: Diagnosis not present

## 2019-01-13 DIAGNOSIS — I480 Paroxysmal atrial fibrillation: Secondary | ICD-10-CM | POA: Diagnosis not present

## 2019-01-13 DIAGNOSIS — I2510 Atherosclerotic heart disease of native coronary artery without angina pectoris: Secondary | ICD-10-CM | POA: Diagnosis not present

## 2019-01-13 DIAGNOSIS — I5032 Chronic diastolic (congestive) heart failure: Secondary | ICD-10-CM | POA: Diagnosis not present

## 2019-01-13 MED ORDER — DICYCLOMINE HCL 20 MG PO TABS: 20.0000 mg | ORAL_TABLET | Freq: Four times a day (QID) | ORAL | 0 refills | Status: DC | PRN

## 2019-01-13 NOTE — Progress Notes (Signed)
Subjective:    Patient ID: Lindsay Nguyen, female    DOB: 02-Jun-1936, 83 y.o.   MRN: [id number]  HPI  Patient was admitted to the hospital recently with bowel illness.  I have copied relevant portions of the discharge summary below for reference: Admit date: 08/19/2018 Discharge date: 08/26/2018  Time spent: 45 minutes  Recommendations for Outpatient Follow-up:  Patient will be discharged to nursing facility, continue physical and occupational therapy.  Patient will need to follow up with primary care provider within one week of discharge repeat BMP and magnesium.  Patient should continue medications as prescribed.  Patient should follow a heart healthy/carb modified diet.   Patient has midline for access placement. Family has requested that this stay in place for discharge in the event that patient will need electrolyte replacement. This can be a source of infection, which was relayed to the family.   Discharge Diagnoses:  Principal problem: Febrile illness Essential hypertension Paroxysmal atrial fibrillation Chronic diastolic heart failure Hypokalemia/hypomagnesemia Diabetes mellitus, type II Hypothyroidism  Dementia Deconditioning  History of present illness:  On 08/19/2018 by Dr. Steward Ros Gerlene Burdock a 83 y.o.femalewith medical history significant ofcoronary artery disease, diabetes, [name] comes in with weakness today with fever and nausea. Patient has not been feeling well for several days. She denies any shortness of breath or cough. She has been nauseous without vomiting. She is denies any rashes on her body. She denies any diarrhea. She denies any abdominal pain or chest pain. She denies any nasal congestion. She just feels awful and weak. Patient is being referred for admission for fever over 101 of unclear source. Both her son and daughter present her daughter is a Designer, jewellery at [employer] and her son is a retired Marine scientist. 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  Hospital Course:  Febrile illness -Present on admission, however patient did rule out for sepsis -Was placed on vancomycin and cefepime however these were discontinued given that no source of infection was found -Respiratory viral panel and influenza PCR unremarkable -Blood cultures showed no growth to date -UA and chest x-ray reviewed, unremarkable for infection -Remains afebrile with no leukocytosis  Essential hypertension -Continue metoprolol, losartan, amlodipine -BP slightly uncontrolled, however cardiology did recommend increasing amlodipine to 5 mg daily  Paroxysmal atrial fibrillation -Continue Xarelto for anticoagulation -Rate and rhythm controlled, continue metoprolol -Cardiology consulted and appreciated, recommending that family or patient to mail in box for interrogation of patient's device  Chronic diastolic heart failure -Patient does not appear to be volume overloaded.  Currently euvolemic and compensated -Echocardiogram 02/21/2018 showed an EF of 6065%.  Study was technically insufficient to allow LV diastolic function evaluation -Cardiology suggested increasing Norvasc to 5 mg daily  Hypokalemia/hypomagnesemia -Potassium replaced, repeat BMP in 1 week -Medium 1.8, recommend repeating in 1 week  Diabetes mellitus, type II -Hemoglobin A1c 8.9 -Continue home medications on discharge, Jardiance, Januvia  Hypothyroidism  -Continue Synthroid  Dementia -Continue Namenda and Aricept  Deconditioning -In the presence of febrile illness.  Patient does appear to be very weak and requiring rehab.  PT consulted recommending SNF.  09/19/18 Patient just got home from skilled nursing facility.  She is receiving outpatient physical therapy through home health agency.  Since discharge from the hospital, she has had no further febrile illness.  She has been afebrile now for more than 3 weeks.  She denies any cough.  She denies any chest pain.   She denies any shortness of breath.  She denies any dyspnea on exertion.  She denies any otalgia or sore throat.  She denies any dysuria, urgency, or frequency.  She denies any nausea or vomiting or diarrhea.  TSH was checked in January and was normal.  Hemoglobin A1c was checked and found to be elevated at 8.9.  Patient has been compliant taking her Januvia and her Jardiance on a daily basis.  Previously she was on metformin and this was stopped due to her chronic abdominal discomfort and nausea however this was found to be gastroparesis and responded to Reglan.  She has not been back on metformin since.  At that time, my plan was: Patient had low magnesium in the hospital.  I will recheck that today along with a CBC and a BMP.  There is no evidence of any febrile illness on exam today.  She is completely asymptomatic despite having been off antibiotics now for 3 weeks.  Therefore I feel no further work-up is necessary for her fever which was most likely a virus.  Given her uncontrolled diabetes I recommended starting metformin 500 mg daily and gradually increasing as the patient will tolerate up to 1000 g twice daily however I cautioned the family to monitor for diarrhea and to stop the medication if diarrhea worsens.  Continue her current treatment for Alzheimer's disease with Aricept and Namenda.  01/13/19  Patient is here today with her son.  Due to her dementia she is not able to contribute to the history of present illness.  Her son states that approximately 3 weeks ago she developed a fever to 101.6.  She was having abdominal pain.  He took her to the emergency room in [place].  There an abdominal CT scan was performed which per the patient's son revealed no cause of her abdominal pain but did show bibasilar pneumonia.  She was admitted to the hospital for 3 days and was treated with Rocephin and Zithromax.  She was then discharged home on a Z-Pak along with [place].  She has been off antibiotics now  per her son's report for approximately 2 weeks.  She is remained afebrile throughout the entire 2 weeks.  Her oxygen saturations remain 97%.  She does have an occasional cough but she denies any chest pain or shortness of breath.  She continues to have her chronic abdominal pain.  She has been dealing with this abdominal pain for many years and long before I became her PCP.  Today she states that she is having pain in her lower abdomen around her bladder.  She states is constant.  She states that she is in pain now however she appears comfortable and in no distress.  Her abdomen is soft nondistended nontender with normal bowel sounds.  CT scan obtained January 30 at [facility] revealed no cause of abdominal pain.  I started the patient on Reglan for presumed gastroparesis which helps some.  Per the patient's son there was no findings on the CT scan that she had recently at [facility] that showed any cause of her abdominal pain.  Past Medical History:  Diagnosis Date   Anemia    Atrial fibrillation ([place])    persistent; on Eliquis   CAD (coronary artery disease)    2D ECHO, 11/04/2010 - EF >55%, mild-moderate mitral regurgitation, moderate tricuspid regurgitation   Dementia ([facility])    Diabetes mellitus    GERD (gastroesophageal reflux disease)    Hyperlipidemia    Hypertension    Osteopenia    Stroke ([place])  Thoracic aortic aneurysm (HCC)    status post resection and grafting   Thyroid disease    hypothyroid   Past Surgical History:  Procedure Laterality Date   CARDIAC CATHETERIZATION  06/18/2010   Recommended CABG   CARDIOVERSION N/A 09/30/2015   Procedure: CARDIOVERSION;  Surgeon: [name], MD;  Location: [place];  Service: Cardiovascular;  Laterality: N/A;   CARDIOVERSION N/A 06/05/2016   Procedure: CARDIOVERSION;  Surgeon: [name], MD;  Location: [place];  Service: Cardiovascular;  Laterality: N/A;   CHOLECYSTECTOMY     CORONARY ARTERY BYPASS GRAFT   06/18/2010   LIMA-LAD, vein-diagonal branch, vein-obtuse marginal branch, and resectioning and grafting of throacic aortic aneurysm   ELBOW SURGERY  1979   fracture left   THORACIC AORTIC ANEURYSM [place]   Current Outpatient Medications on File Prior to Visit  Medication Sig Dispense Refill   albuterol (PROVENTIL) (2.5 MG/3ML) 0.083% nebulizer solution Take 2.5 mg by nebulization every 2 (two) hours as needed for wheezing or shortness of breath.   5   amLODipine (NORVASC) 5 MG tablet Take 1 tablet (5 mg total) by mouth daily.     aspirin EC 81 MG tablet Take 1 tablet (81 mg total) by mouth daily. 90 tablet 3   atorvastatin (LIPITOR) 40 MG tablet Take 1 tablet (40 mg total) by mouth daily at 6 PM. 90 tablet 2   budesonide (PULMICORT) 0.25 MG/2ML nebulizer solution Take 0.25 mg by nebulization daily as needed (SOB).      donepezil (ARICEPT) 10 MG tablet TAKE 1 TABLET(10 MG) BY MOUTH AT BEDTIME 90 tablet 3   escitalopram (LEXAPRO) 10 MG tablet TAKE 1 TABLET(10 MG) BY MOUTH DAILY 90 tablet 1   JANUVIA 100 MG tablet TAKE 1 TABLET BY MOUTH DAILY 90 tablet 0   JARDIANCE 25 MG TABS tablet TAKE 1/2 TABLET BY MOUTH DAILY 30 tablet 3   levothyroxine (SYNTHROID) 112 MCG tablet Take 0.5 tablets (56 mcg total) by mouth daily before breakfast.     loratadine (CLARITIN) 10 MG tablet Take 10 mg by mouth daily.      losartan (COZAAR) 50 MG tablet TAKE 1 TABLET BY MOUTH DAILY(DISCONTINUE VALSARTAN) 90 tablet 1   memantine (NAMENDA) 10 MG tablet TAKE 1 TABLET(10 MG) BY MOUTH TWICE DAILY 60 tablet 2   metFORMIN (GLUCOPHAGE) 500 MG tablet Take 2 tablets (1,000 mg total) by mouth 2 (two) times daily with a meal. 120 tablet 3   metoCLOPramide (REGLAN) 5 MG tablet TAKE 1 TABLET(5 MG) BY MOUTH FOUR TIMES DAILY BEFORE MEALS AND AT BEDTIME (Patient taking differently: Take 5 mg by mouth 3 (three) times daily. ) 120 tablet 3   metoprolol succinate  (TOPROL-XL) 25 MG 24 hr tablet Take 1 tablet (25 mg total) by mouth daily.     nitroGLYCERIN (NITROSTAT) 0.4 MG SL tablet Place 1 tablet (0.4 mg total) under the tongue every 5 (five) minutes as needed for chest pain (MAX 3 TABLETS). 25 tablet 4   SYNTHROID 112 MCG tablet TAKE 1/2 TABLET BY MOUTH DAILY BEFORE BREAKFAST 90 tablet 0   XARELTO 20 MG TABS tablet TAKE 1 TABLET(20 MG) BY MOUTH DAILY WITH SUPPER 90 tablet 3   No current facility-administered medications on file prior to visit.    Allergies  Allergen Reactions   Contrast Media [Iodinated Diagnostic Agents] Anaphylaxis and Swelling   Iodine Anaphylaxis and Swelling   Shellfish Allergy Anaphylaxis  unknown   Gabapentin Hives   Social History   Socioeconomic History   Marital status: Married    Spouse name: Not on file   Number of children: Not on file   Years of education: Not on file   Highest education level: Not on file  Occupational History   Not on file  Social Needs   Financial resource strain: Not on file   Food insecurity    Worry: Not on file    Inability: Not on file   Transportation needs    Medical: Not on file    Non-medical: Not on file  Tobacco Use   Smoking status: Never Smoker   Smokeless tobacco: Never Used  Substance and Sexual Activity   Alcohol use: No   Drug use: No   Sexual activity: Not on file  Lifestyle   Physical activity    Days per week: Not on file    Minutes per session: Not on file   Stress: Not on file  Relationships   Social connections    Talks on phone: Not on file    Gets together: Not on file    Attends religious service: Not on file    Active member of club or organization: Not on file    Attends meetings of clubs or organizations: Not on file    Relationship status: Not on file   Intimate partner violence    Fear of current or ex partner: Not on file    Emotionally abused: Not on file    Physically abused: Not on file    Forced sexual  activity: Not on file  Other Topics Concern   Not on file  Social History Narrative   Patient's daughter is a Designer, jewellery who does hospitalist work at [employer]  Her son is a Marine scientist      Review of Systems  All other systems reviewed and are negative.      Objective:   Physical Exam  Constitutional: She is oriented to person, place, and time. She appears well-developed and well-nourished. No distress.  Neck: Neck supple. No JVD present.  Cardiovascular: Normal rate. An irregularly irregular rhythm present.  Murmur heard. Pulmonary/Chest: Effort normal and breath sounds normal. No respiratory distress. She has no wheezes. She has no rales.  Abdominal: Soft. Bowel sounds are normal. She exhibits no distension. There is no abdominal tenderness. There is no rebound.  Musculoskeletal:        General: No edema.  Neurological: She is alert and oriented to person, place, and time. No cranial nerve deficit. She exhibits normal muscle tone. Coordination normal.  Skin: She is not diaphoretic.  Vitals reviewed.         Assessment & Plan:  1. Uncontrolled diabetes mellitus type 2 without complications ([place]) We will check a hemoglobin A1c to monitor the management of her diabetes.  She never increase metformin as we discussed and her son states that she is taking 500 mg twice a day sporadically.  However she denies any hypoglycemic episodes. - Hemoglobin A1c - CBC with Differential/Platelet - COMPLETE METABOLIC PANEL WITH GFR  2. Hospital discharge follow-up Clinically her pneumonia has resolved.  I am concerned by her abdominal pain.  Next that would be a GI referral for colonoscopy.  Her last colonoscopy was 2015 and at that time it was completely normal.  CT scans x2 have been normal.  Pain is been present for several years.  I believe this is most likely IBS with intestinal  spasms.  We will try the patient empirically on Bentyl 20 mg every 6 hours as needed pain and  see if this helps her pain.  Son is in agreement.  Obtain records from [facility] to review for any other follow-up issues

## 2019-01-14 DIAGNOSIS — Z8673 Personal history of transient ischemic attack (TIA), and cerebral infarction without residual deficits: Secondary | ICD-10-CM | POA: Diagnosis not present

## 2019-01-14 DIAGNOSIS — K3184 Gastroparesis: Secondary | ICD-10-CM | POA: Diagnosis not present

## 2019-01-14 DIAGNOSIS — E1151 Type 2 diabetes mellitus with diabetic peripheral angiopathy without gangrene: Secondary | ICD-10-CM | POA: Diagnosis not present

## 2019-01-14 DIAGNOSIS — I5032 Chronic diastolic (congestive) heart failure: Secondary | ICD-10-CM | POA: Diagnosis not present

## 2019-01-14 DIAGNOSIS — Z7984 Long term (current) use of oral hypoglycemic drugs: Secondary | ICD-10-CM | POA: Diagnosis not present

## 2019-01-14 DIAGNOSIS — F329 Major depressive disorder, single episode, unspecified: Secondary | ICD-10-CM | POA: Diagnosis not present

## 2019-01-14 DIAGNOSIS — I712 Thoracic aortic aneurysm, without rupture: Secondary | ICD-10-CM | POA: Diagnosis not present

## 2019-01-14 DIAGNOSIS — J440 Chronic obstructive pulmonary disease with acute lower respiratory infection: Secondary | ICD-10-CM | POA: Diagnosis not present

## 2019-01-14 DIAGNOSIS — K219 Gastro-esophageal reflux disease without esophagitis: Secondary | ICD-10-CM | POA: Diagnosis not present

## 2019-01-14 DIAGNOSIS — E039 Hypothyroidism, unspecified: Secondary | ICD-10-CM | POA: Diagnosis not present

## 2019-01-14 DIAGNOSIS — Z9851 Tubal ligation status: Secondary | ICD-10-CM | POA: Diagnosis not present

## 2019-01-14 DIAGNOSIS — I480 Paroxysmal atrial fibrillation: Secondary | ICD-10-CM | POA: Diagnosis not present

## 2019-01-14 DIAGNOSIS — Z7901 Long term (current) use of anticoagulants: Secondary | ICD-10-CM | POA: Diagnosis not present

## 2019-01-14 DIAGNOSIS — I110 Hypertensive heart disease with heart failure: Secondary | ICD-10-CM | POA: Diagnosis not present

## 2019-01-14 DIAGNOSIS — Z951 Presence of aortocoronary bypass graft: Secondary | ICD-10-CM | POA: Diagnosis not present

## 2019-01-14 DIAGNOSIS — E1143 Type 2 diabetes mellitus with diabetic autonomic (poly)neuropathy: Secondary | ICD-10-CM | POA: Diagnosis not present

## 2019-01-14 DIAGNOSIS — Z8781 Personal history of (healed) traumatic fracture: Secondary | ICD-10-CM | POA: Diagnosis not present

## 2019-01-14 DIAGNOSIS — J189 Pneumonia, unspecified organism: Secondary | ICD-10-CM | POA: Diagnosis not present

## 2019-01-14 DIAGNOSIS — Z7982 Long term (current) use of aspirin: Secondary | ICD-10-CM | POA: Diagnosis not present

## 2019-01-14 DIAGNOSIS — F0280 Dementia in other diseases classified elsewhere without behavioral disturbance: Secondary | ICD-10-CM | POA: Diagnosis not present

## 2019-01-14 DIAGNOSIS — I2510 Atherosclerotic heart disease of native coronary artery without angina pectoris: Secondary | ICD-10-CM | POA: Diagnosis not present

## 2019-01-14 DIAGNOSIS — M8580 Other specified disorders of bone density and structure, unspecified site: Secondary | ICD-10-CM | POA: Diagnosis not present

## 2019-01-14 DIAGNOSIS — Z9049 Acquired absence of other specified parts of digestive tract: Secondary | ICD-10-CM | POA: Diagnosis not present

## 2019-01-14 DIAGNOSIS — E785 Hyperlipidemia, unspecified: Secondary | ICD-10-CM | POA: Diagnosis not present

## 2019-01-14 LAB — COMPLETE METABOLIC PANEL WITH GFR
AG Ratio: 1.1 (calc) (ref 1.0–2.5)
ALT: 10 U/L (ref 6–29)
AST: 15 U/L (ref 10–35)
Albumin: 3.5 g/dL — ABNORMAL LOW (ref 3.6–5.1)
Alkaline phosphatase (APISO): 58 U/L (ref 37–153)
BUN/Creatinine Ratio: 20 (calc) (ref 6–22)
BUN: 20 mg/dL (ref 7–25)
CO2: 22 mmol/L (ref 20–32)
Calcium: 9 mg/dL (ref 8.6–10.4)
Chloride: 102 mmol/L (ref 98–110)
Creat: 1.02 mg/dL — ABNORMAL HIGH (ref 0.60–0.88)
GFR, Est African American: 60 mL/min/{1.73_m2} (ref >=60)
GFR, Est Non African American: 52 mL/min/{1.73_m2} — ABNORMAL LOW (ref >=60)
Globulin: 3.3 g/dL (calc) (ref 1.9–3.7)
Glucose, Bld: 269 mg/dL — ABNORMAL HIGH (ref 65–99)
Potassium: 4 mmol/L (ref 3.5–5.3)
Sodium: 136 mmol/L (ref 135–146)
Total Bilirubin: 0.5 mg/dL (ref 0.2–1.2)
Total Protein: 6.8 g/dL (ref 6.1–8.1)

## 2019-01-14 LAB — HEMOGLOBIN A1C
Hgb A1c MFr Bld: 7.2 % of total Hgb — ABNORMAL HIGH (ref <5.7)
Mean Plasma Glucose: 160 (calc)
eAG (mmol/L): 8.9 (calc)

## 2019-01-14 LAB — CBC WITH DIFFERENTIAL/PLATELET
Absolute Monocytes: 1009 cells/uL — ABNORMAL HIGH (ref 200–950)
Basophils Absolute: 33 cells/uL (ref 0–200)
Basophils Relative: 0.4 %
Eosinophils Absolute: 189 cells/uL (ref 15–500)
Eosinophils Relative: 2.3 %
HCT: 37.7 % (ref 35.0–45.0)
Hemoglobin: 12.8 g/dL (ref 11.7–15.5)
Lymphs Abs: 2148 cells/uL (ref 850–3900)
MCH: 32.7 pg (ref 27.0–33.0)
MCHC: 34 g/dL (ref 32.0–36.0)
MCV: 96.4 fL (ref 80.0–100.0)
MPV: 11.6 fL (ref 7.5–12.5)
Monocytes Relative: 12.3 %
Neutro Abs: 4822 cells/uL (ref 1500–7800)
Neutrophils Relative %: 58.8 %
Platelets: 298 10*3/uL (ref 140–400)
RBC: 3.91 10*6/uL (ref 3.80–5.10)
RDW: 12.2 % (ref 11.0–15.0)
Total Lymphocyte: 26.2 %
WBC: 8.2 10*3/uL (ref 3.8–10.8)

## 2019-01-17 DIAGNOSIS — J189 Pneumonia, unspecified organism: Secondary | ICD-10-CM | POA: Diagnosis not present

## 2019-01-17 DIAGNOSIS — J440 Chronic obstructive pulmonary disease with acute lower respiratory infection: Secondary | ICD-10-CM | POA: Diagnosis not present

## 2019-01-17 DIAGNOSIS — E039 Hypothyroidism, unspecified: Secondary | ICD-10-CM | POA: Diagnosis not present

## 2019-01-17 DIAGNOSIS — I110 Hypertensive heart disease with heart failure: Secondary | ICD-10-CM | POA: Diagnosis not present

## 2019-01-17 DIAGNOSIS — I2510 Atherosclerotic heart disease of native coronary artery without angina pectoris: Secondary | ICD-10-CM | POA: Diagnosis not present

## 2019-01-17 DIAGNOSIS — I5032 Chronic diastolic (congestive) heart failure: Secondary | ICD-10-CM | POA: Diagnosis not present

## 2019-01-18 DIAGNOSIS — E039 Hypothyroidism, unspecified: Secondary | ICD-10-CM | POA: Diagnosis not present

## 2019-01-18 DIAGNOSIS — I110 Hypertensive heart disease with heart failure: Secondary | ICD-10-CM | POA: Diagnosis not present

## 2019-01-18 DIAGNOSIS — J440 Chronic obstructive pulmonary disease with acute lower respiratory infection: Secondary | ICD-10-CM | POA: Diagnosis not present

## 2019-01-18 DIAGNOSIS — J189 Pneumonia, unspecified organism: Secondary | ICD-10-CM | POA: Diagnosis not present

## 2019-01-18 DIAGNOSIS — I2510 Atherosclerotic heart disease of native coronary artery without angina pectoris: Secondary | ICD-10-CM | POA: Diagnosis not present

## 2019-01-18 DIAGNOSIS — I5032 Chronic diastolic (congestive) heart failure: Secondary | ICD-10-CM | POA: Diagnosis not present

## 2019-01-19 ENCOUNTER — Other Ambulatory Visit: Payer: Self-pay | Admitting: Cardiovascular Disease

## 2019-01-20 DIAGNOSIS — I110 Hypertensive heart disease with heart failure: Secondary | ICD-10-CM | POA: Diagnosis not present

## 2019-01-20 DIAGNOSIS — I2510 Atherosclerotic heart disease of native coronary artery without angina pectoris: Secondary | ICD-10-CM | POA: Diagnosis not present

## 2019-01-20 DIAGNOSIS — J189 Pneumonia, unspecified organism: Secondary | ICD-10-CM | POA: Diagnosis not present

## 2019-01-20 DIAGNOSIS — J440 Chronic obstructive pulmonary disease with acute lower respiratory infection: Secondary | ICD-10-CM | POA: Diagnosis not present

## 2019-01-20 DIAGNOSIS — E039 Hypothyroidism, unspecified: Secondary | ICD-10-CM | POA: Diagnosis not present

## 2019-01-20 DIAGNOSIS — I5032 Chronic diastolic (congestive) heart failure: Secondary | ICD-10-CM | POA: Diagnosis not present

## 2019-01-21 ENCOUNTER — Other Ambulatory Visit: Payer: Self-pay | Admitting: Family Medicine

## 2019-01-24 DIAGNOSIS — E039 Hypothyroidism, unspecified: Secondary | ICD-10-CM | POA: Diagnosis not present

## 2019-01-24 DIAGNOSIS — J440 Chronic obstructive pulmonary disease with acute lower respiratory infection: Secondary | ICD-10-CM | POA: Diagnosis not present

## 2019-01-24 DIAGNOSIS — J189 Pneumonia, unspecified organism: Secondary | ICD-10-CM | POA: Diagnosis not present

## 2019-01-24 DIAGNOSIS — I5032 Chronic diastolic (congestive) heart failure: Secondary | ICD-10-CM | POA: Diagnosis not present

## 2019-01-24 DIAGNOSIS — I110 Hypertensive heart disease with heart failure: Secondary | ICD-10-CM | POA: Diagnosis not present

## 2019-01-24 DIAGNOSIS — I2510 Atherosclerotic heart disease of native coronary artery without angina pectoris: Secondary | ICD-10-CM | POA: Diagnosis not present

## 2019-01-27 DIAGNOSIS — I2510 Atherosclerotic heart disease of native coronary artery without angina pectoris: Secondary | ICD-10-CM | POA: Diagnosis not present

## 2019-01-27 DIAGNOSIS — J440 Chronic obstructive pulmonary disease with acute lower respiratory infection: Secondary | ICD-10-CM | POA: Diagnosis not present

## 2019-01-27 DIAGNOSIS — I110 Hypertensive heart disease with heart failure: Secondary | ICD-10-CM | POA: Diagnosis not present

## 2019-01-27 DIAGNOSIS — I5032 Chronic diastolic (congestive) heart failure: Secondary | ICD-10-CM | POA: Diagnosis not present

## 2019-01-27 DIAGNOSIS — E039 Hypothyroidism, unspecified: Secondary | ICD-10-CM | POA: Diagnosis not present

## 2019-01-27 DIAGNOSIS — J189 Pneumonia, unspecified organism: Secondary | ICD-10-CM | POA: Diagnosis not present

## 2019-01-31 DIAGNOSIS — J440 Chronic obstructive pulmonary disease with acute lower respiratory infection: Secondary | ICD-10-CM | POA: Diagnosis not present

## 2019-01-31 DIAGNOSIS — I5032 Chronic diastolic (congestive) heart failure: Secondary | ICD-10-CM | POA: Diagnosis not present

## 2019-01-31 DIAGNOSIS — I110 Hypertensive heart disease with heart failure: Secondary | ICD-10-CM | POA: Diagnosis not present

## 2019-01-31 DIAGNOSIS — E039 Hypothyroidism, unspecified: Secondary | ICD-10-CM | POA: Diagnosis not present

## 2019-01-31 DIAGNOSIS — I2510 Atherosclerotic heart disease of native coronary artery without angina pectoris: Secondary | ICD-10-CM | POA: Diagnosis not present

## 2019-01-31 DIAGNOSIS — J189 Pneumonia, unspecified organism: Secondary | ICD-10-CM | POA: Diagnosis not present

## 2019-02-02 DIAGNOSIS — E039 Hypothyroidism, unspecified: Secondary | ICD-10-CM | POA: Diagnosis not present

## 2019-02-02 DIAGNOSIS — J440 Chronic obstructive pulmonary disease with acute lower respiratory infection: Secondary | ICD-10-CM | POA: Diagnosis not present

## 2019-02-02 DIAGNOSIS — I5032 Chronic diastolic (congestive) heart failure: Secondary | ICD-10-CM | POA: Diagnosis not present

## 2019-02-02 DIAGNOSIS — I2510 Atherosclerotic heart disease of native coronary artery without angina pectoris: Secondary | ICD-10-CM | POA: Diagnosis not present

## 2019-02-02 DIAGNOSIS — I110 Hypertensive heart disease with heart failure: Secondary | ICD-10-CM | POA: Diagnosis not present

## 2019-02-02 DIAGNOSIS — J189 Pneumonia, unspecified organism: Secondary | ICD-10-CM | POA: Diagnosis not present

## 2019-02-03 DIAGNOSIS — I2510 Atherosclerotic heart disease of native coronary artery without angina pectoris: Secondary | ICD-10-CM | POA: Diagnosis not present

## 2019-02-03 DIAGNOSIS — J189 Pneumonia, unspecified organism: Secondary | ICD-10-CM | POA: Diagnosis not present

## 2019-02-03 DIAGNOSIS — E039 Hypothyroidism, unspecified: Secondary | ICD-10-CM | POA: Diagnosis not present

## 2019-02-03 DIAGNOSIS — I5032 Chronic diastolic (congestive) heart failure: Secondary | ICD-10-CM | POA: Diagnosis not present

## 2019-02-03 DIAGNOSIS — I110 Hypertensive heart disease with heart failure: Secondary | ICD-10-CM | POA: Diagnosis not present

## 2019-02-03 DIAGNOSIS — J440 Chronic obstructive pulmonary disease with acute lower respiratory infection: Secondary | ICD-10-CM | POA: Diagnosis not present

## 2019-02-06 DIAGNOSIS — E039 Hypothyroidism, unspecified: Secondary | ICD-10-CM | POA: Diagnosis not present

## 2019-02-06 DIAGNOSIS — I5032 Chronic diastolic (congestive) heart failure: Secondary | ICD-10-CM | POA: Diagnosis not present

## 2019-02-06 DIAGNOSIS — I2510 Atherosclerotic heart disease of native coronary artery without angina pectoris: Secondary | ICD-10-CM | POA: Diagnosis not present

## 2019-02-06 DIAGNOSIS — J440 Chronic obstructive pulmonary disease with acute lower respiratory infection: Secondary | ICD-10-CM | POA: Diagnosis not present

## 2019-02-06 DIAGNOSIS — I110 Hypertensive heart disease with heart failure: Secondary | ICD-10-CM | POA: Diagnosis not present

## 2019-02-06 DIAGNOSIS — J189 Pneumonia, unspecified organism: Secondary | ICD-10-CM | POA: Diagnosis not present

## 2019-02-08 DIAGNOSIS — I5032 Chronic diastolic (congestive) heart failure: Secondary | ICD-10-CM | POA: Diagnosis not present

## 2019-02-08 DIAGNOSIS — E039 Hypothyroidism, unspecified: Secondary | ICD-10-CM | POA: Diagnosis not present

## 2019-02-08 DIAGNOSIS — J189 Pneumonia, unspecified organism: Secondary | ICD-10-CM | POA: Diagnosis not present

## 2019-02-08 DIAGNOSIS — I110 Hypertensive heart disease with heart failure: Secondary | ICD-10-CM | POA: Diagnosis not present

## 2019-02-08 DIAGNOSIS — I2510 Atherosclerotic heart disease of native coronary artery without angina pectoris: Secondary | ICD-10-CM | POA: Diagnosis not present

## 2019-02-08 DIAGNOSIS — J440 Chronic obstructive pulmonary disease with acute lower respiratory infection: Secondary | ICD-10-CM | POA: Diagnosis not present

## 2019-02-09 DIAGNOSIS — J189 Pneumonia, unspecified organism: Secondary | ICD-10-CM | POA: Diagnosis not present

## 2019-02-09 DIAGNOSIS — E039 Hypothyroidism, unspecified: Secondary | ICD-10-CM | POA: Diagnosis not present

## 2019-02-09 DIAGNOSIS — J440 Chronic obstructive pulmonary disease with acute lower respiratory infection: Secondary | ICD-10-CM | POA: Diagnosis not present

## 2019-02-09 DIAGNOSIS — I2510 Atherosclerotic heart disease of native coronary artery without angina pectoris: Secondary | ICD-10-CM | POA: Diagnosis not present

## 2019-02-09 DIAGNOSIS — I5032 Chronic diastolic (congestive) heart failure: Secondary | ICD-10-CM | POA: Diagnosis not present

## 2019-02-09 DIAGNOSIS — I110 Hypertensive heart disease with heart failure: Secondary | ICD-10-CM | POA: Diagnosis not present

## 2019-02-13 DIAGNOSIS — E1143 Type 2 diabetes mellitus with diabetic autonomic (poly)neuropathy: Secondary | ICD-10-CM | POA: Diagnosis not present

## 2019-02-13 DIAGNOSIS — Z7984 Long term (current) use of oral hypoglycemic drugs: Secondary | ICD-10-CM | POA: Diagnosis not present

## 2019-02-13 DIAGNOSIS — J189 Pneumonia, unspecified organism: Secondary | ICD-10-CM | POA: Diagnosis not present

## 2019-02-13 DIAGNOSIS — Z7982 Long term (current) use of aspirin: Secondary | ICD-10-CM | POA: Diagnosis not present

## 2019-02-13 DIAGNOSIS — J440 Chronic obstructive pulmonary disease with acute lower respiratory infection: Secondary | ICD-10-CM | POA: Diagnosis not present

## 2019-02-13 DIAGNOSIS — K219 Gastro-esophageal reflux disease without esophagitis: Secondary | ICD-10-CM | POA: Diagnosis not present

## 2019-02-13 DIAGNOSIS — E039 Hypothyroidism, unspecified: Secondary | ICD-10-CM | POA: Diagnosis not present

## 2019-02-13 DIAGNOSIS — Z8781 Personal history of (healed) traumatic fracture: Secondary | ICD-10-CM | POA: Diagnosis not present

## 2019-02-13 DIAGNOSIS — M8580 Other specified disorders of bone density and structure, unspecified site: Secondary | ICD-10-CM | POA: Diagnosis not present

## 2019-02-13 DIAGNOSIS — I5032 Chronic diastolic (congestive) heart failure: Secondary | ICD-10-CM | POA: Diagnosis not present

## 2019-02-13 DIAGNOSIS — Z9049 Acquired absence of other specified parts of digestive tract: Secondary | ICD-10-CM | POA: Diagnosis not present

## 2019-02-13 DIAGNOSIS — K3184 Gastroparesis: Secondary | ICD-10-CM | POA: Diagnosis not present

## 2019-02-13 DIAGNOSIS — Z951 Presence of aortocoronary bypass graft: Secondary | ICD-10-CM | POA: Diagnosis not present

## 2019-02-13 DIAGNOSIS — Z9851 Tubal ligation status: Secondary | ICD-10-CM | POA: Diagnosis not present

## 2019-02-13 DIAGNOSIS — Z7901 Long term (current) use of anticoagulants: Secondary | ICD-10-CM | POA: Diagnosis not present

## 2019-02-13 DIAGNOSIS — Z8673 Personal history of transient ischemic attack (TIA), and cerebral infarction without residual deficits: Secondary | ICD-10-CM | POA: Diagnosis not present

## 2019-02-13 DIAGNOSIS — F0280 Dementia in other diseases classified elsewhere without behavioral disturbance: Secondary | ICD-10-CM | POA: Diagnosis not present

## 2019-02-13 DIAGNOSIS — F329 Major depressive disorder, single episode, unspecified: Secondary | ICD-10-CM | POA: Diagnosis not present

## 2019-02-13 DIAGNOSIS — I110 Hypertensive heart disease with heart failure: Secondary | ICD-10-CM | POA: Diagnosis not present

## 2019-02-13 DIAGNOSIS — E785 Hyperlipidemia, unspecified: Secondary | ICD-10-CM | POA: Diagnosis not present

## 2019-02-13 DIAGNOSIS — I2510 Atherosclerotic heart disease of native coronary artery without angina pectoris: Secondary | ICD-10-CM | POA: Diagnosis not present

## 2019-02-13 DIAGNOSIS — E1151 Type 2 diabetes mellitus with diabetic peripheral angiopathy without gangrene: Secondary | ICD-10-CM | POA: Diagnosis not present

## 2019-02-13 DIAGNOSIS — I712 Thoracic aortic aneurysm, without rupture: Secondary | ICD-10-CM | POA: Diagnosis not present

## 2019-02-13 DIAGNOSIS — I480 Paroxysmal atrial fibrillation: Secondary | ICD-10-CM | POA: Diagnosis not present

## 2019-02-14 DIAGNOSIS — J189 Pneumonia, unspecified organism: Secondary | ICD-10-CM | POA: Diagnosis not present

## 2019-02-14 DIAGNOSIS — I5032 Chronic diastolic (congestive) heart failure: Secondary | ICD-10-CM | POA: Diagnosis not present

## 2019-02-14 DIAGNOSIS — J440 Chronic obstructive pulmonary disease with acute lower respiratory infection: Secondary | ICD-10-CM | POA: Diagnosis not present

## 2019-02-14 DIAGNOSIS — E039 Hypothyroidism, unspecified: Secondary | ICD-10-CM | POA: Diagnosis not present

## 2019-02-14 DIAGNOSIS — I110 Hypertensive heart disease with heart failure: Secondary | ICD-10-CM | POA: Diagnosis not present

## 2019-02-14 DIAGNOSIS — I2510 Atherosclerotic heart disease of native coronary artery without angina pectoris: Secondary | ICD-10-CM | POA: Diagnosis not present

## 2019-02-19 ENCOUNTER — Other Ambulatory Visit: Payer: Self-pay | Admitting: Family Medicine

## 2019-02-21 DIAGNOSIS — J189 Pneumonia, unspecified organism: Secondary | ICD-10-CM | POA: Diagnosis not present

## 2019-02-21 DIAGNOSIS — J440 Chronic obstructive pulmonary disease with acute lower respiratory infection: Secondary | ICD-10-CM | POA: Diagnosis not present

## 2019-02-21 DIAGNOSIS — E039 Hypothyroidism, unspecified: Secondary | ICD-10-CM | POA: Diagnosis not present

## 2019-02-21 DIAGNOSIS — I2510 Atherosclerotic heart disease of native coronary artery without angina pectoris: Secondary | ICD-10-CM | POA: Diagnosis not present

## 2019-02-21 DIAGNOSIS — I5032 Chronic diastolic (congestive) heart failure: Secondary | ICD-10-CM | POA: Diagnosis not present

## 2019-02-21 DIAGNOSIS — I110 Hypertensive heart disease with heart failure: Secondary | ICD-10-CM | POA: Diagnosis not present

## 2019-02-24 DIAGNOSIS — J440 Chronic obstructive pulmonary disease with acute lower respiratory infection: Secondary | ICD-10-CM | POA: Diagnosis not present

## 2019-02-24 DIAGNOSIS — I5032 Chronic diastolic (congestive) heart failure: Secondary | ICD-10-CM | POA: Diagnosis not present

## 2019-02-24 DIAGNOSIS — J189 Pneumonia, unspecified organism: Secondary | ICD-10-CM | POA: Diagnosis not present

## 2019-02-24 DIAGNOSIS — E039 Hypothyroidism, unspecified: Secondary | ICD-10-CM | POA: Diagnosis not present

## 2019-02-24 DIAGNOSIS — I110 Hypertensive heart disease with heart failure: Secondary | ICD-10-CM | POA: Diagnosis not present

## 2019-02-24 DIAGNOSIS — I2510 Atherosclerotic heart disease of native coronary artery without angina pectoris: Secondary | ICD-10-CM | POA: Diagnosis not present

## 2019-02-28 ENCOUNTER — Telehealth: Payer: Self-pay | Admitting: *Deleted

## 2019-02-28 NOTE — Telephone Encounter (Signed)
Received fax requesting alternative to Bentyl. Medication is not covered by insurance.    MD please advise.

## 2019-02-28 NOTE — Telephone Encounter (Signed)
Only other option would be levsin 0.125 mg poq6 hrs prn.

## 2019-02-28 NOTE — Telephone Encounter (Signed)
Per insurance plan., Levsin is not covered either.   Preferred alternatives include: Alosetron Omeprazole Protonix  MD please advise.   Of note, PA can be attempted for wither medication if preferred alternatives are not acceptable.

## 2019-02-28 NOTE — Telephone Encounter (Signed)
I do not know of anything cheaper than Bentyl.  Can they call their insurance and see what other options exist besides Bentyl?

## 2019-03-01 DIAGNOSIS — I5032 Chronic diastolic (congestive) heart failure: Secondary | ICD-10-CM | POA: Diagnosis not present

## 2019-03-01 DIAGNOSIS — J189 Pneumonia, unspecified organism: Secondary | ICD-10-CM | POA: Diagnosis not present

## 2019-03-01 DIAGNOSIS — I2510 Atherosclerotic heart disease of native coronary artery without angina pectoris: Secondary | ICD-10-CM | POA: Diagnosis not present

## 2019-03-01 DIAGNOSIS — J440 Chronic obstructive pulmonary disease with acute lower respiratory infection: Secondary | ICD-10-CM | POA: Diagnosis not present

## 2019-03-01 DIAGNOSIS — I110 Hypertensive heart disease with heart failure: Secondary | ICD-10-CM | POA: Diagnosis not present

## 2019-03-01 DIAGNOSIS — E039 Hypothyroidism, unspecified: Secondary | ICD-10-CM | POA: Diagnosis not present

## 2019-03-01 MED ORDER — DICYCLOMINE HCL 20 MG PO TABS: ORAL_TABLET | ORAL | 1 refills | Status: AC

## 2019-03-01 NOTE — Telephone Encounter (Signed)
Call placed to patient and patient daughter Santiago Glad made aware of options. GoodRx coupon brings out of pocket price to $20.00  Agreeable to paying out of pocket for medication.

## 2019-03-03 DIAGNOSIS — I5032 Chronic diastolic (congestive) heart failure: Secondary | ICD-10-CM | POA: Diagnosis not present

## 2019-03-03 DIAGNOSIS — I110 Hypertensive heart disease with heart failure: Secondary | ICD-10-CM | POA: Diagnosis not present

## 2019-03-03 DIAGNOSIS — J440 Chronic obstructive pulmonary disease with acute lower respiratory infection: Secondary | ICD-10-CM | POA: Diagnosis not present

## 2019-03-03 DIAGNOSIS — I2510 Atherosclerotic heart disease of native coronary artery without angina pectoris: Secondary | ICD-10-CM | POA: Diagnosis not present

## 2019-03-03 DIAGNOSIS — E039 Hypothyroidism, unspecified: Secondary | ICD-10-CM | POA: Diagnosis not present

## 2019-03-03 DIAGNOSIS — J189 Pneumonia, unspecified organism: Secondary | ICD-10-CM | POA: Diagnosis not present

## 2019-03-06 ENCOUNTER — Other Ambulatory Visit: Payer: Self-pay

## 2019-03-08 DIAGNOSIS — I110 Hypertensive heart disease with heart failure: Secondary | ICD-10-CM | POA: Diagnosis not present

## 2019-03-08 DIAGNOSIS — I2510 Atherosclerotic heart disease of native coronary artery without angina pectoris: Secondary | ICD-10-CM | POA: Diagnosis not present

## 2019-03-08 DIAGNOSIS — J440 Chronic obstructive pulmonary disease with acute lower respiratory infection: Secondary | ICD-10-CM | POA: Diagnosis not present

## 2019-03-08 DIAGNOSIS — I5032 Chronic diastolic (congestive) heart failure: Secondary | ICD-10-CM | POA: Diagnosis not present

## 2019-03-08 DIAGNOSIS — E039 Hypothyroidism, unspecified: Secondary | ICD-10-CM | POA: Diagnosis not present

## 2019-03-08 DIAGNOSIS — J189 Pneumonia, unspecified organism: Secondary | ICD-10-CM | POA: Diagnosis not present

## 2019-03-13 DIAGNOSIS — I5032 Chronic diastolic (congestive) heart failure: Secondary | ICD-10-CM | POA: Diagnosis not present

## 2019-03-13 DIAGNOSIS — I110 Hypertensive heart disease with heart failure: Secondary | ICD-10-CM | POA: Diagnosis not present

## 2019-03-13 DIAGNOSIS — I2510 Atherosclerotic heart disease of native coronary artery without angina pectoris: Secondary | ICD-10-CM | POA: Diagnosis not present

## 2019-03-13 DIAGNOSIS — J189 Pneumonia, unspecified organism: Secondary | ICD-10-CM | POA: Diagnosis not present

## 2019-03-13 DIAGNOSIS — E039 Hypothyroidism, unspecified: Secondary | ICD-10-CM | POA: Diagnosis not present

## 2019-03-13 DIAGNOSIS — J440 Chronic obstructive pulmonary disease with acute lower respiratory infection: Secondary | ICD-10-CM | POA: Diagnosis not present

## 2019-03-20 ENCOUNTER — Telehealth: Payer: Self-pay

## 2019-03-20 NOTE — Telephone Encounter (Signed)
Pt's daughter called to report that she tested positive for covid and did not find out until after taking pt to hair appt.  I advised pt to quarantine for 10 days from the onset of symptoms and that the pt would need to quarantine for 2 weeks unless becoming asymptomatic.  Pt's daughter states that she works for [employer] and they told her to quarantine for 8 days. I gave the daughter's number to [place] in which she called her back.

## 2019-03-27 ENCOUNTER — Other Ambulatory Visit: Payer: Self-pay

## 2019-03-27 DIAGNOSIS — R6889 Other general symptoms and signs: Secondary | ICD-10-CM | POA: Diagnosis not present

## 2019-03-27 DIAGNOSIS — Z20822 Contact with and (suspected) exposure to covid-19: Secondary | ICD-10-CM

## 2019-03-28 LAB — NOVEL CORONAVIRUS, NAA: SARS-CoV-2, NAA: NOT DETECTED

## 2019-04-07 ENCOUNTER — Other Ambulatory Visit: Payer: Self-pay | Admitting: Family Medicine

## 2019-04-14 ENCOUNTER — Telehealth (INDEPENDENT_AMBULATORY_CARE_PROVIDER_SITE_OTHER): Payer: Medicare Other | Admitting: Cardiovascular Disease

## 2019-04-14 DIAGNOSIS — I10 Essential (primary) hypertension: Secondary | ICD-10-CM | POA: Diagnosis not present

## 2019-04-14 DIAGNOSIS — I482 Chronic atrial fibrillation, unspecified: Secondary | ICD-10-CM

## 2019-04-14 DIAGNOSIS — Z7901 Long term (current) use of anticoagulants: Secondary | ICD-10-CM

## 2019-04-14 DIAGNOSIS — E782 Mixed hyperlipidemia: Secondary | ICD-10-CM

## 2019-04-14 DIAGNOSIS — I5032 Chronic diastolic (congestive) heart failure: Secondary | ICD-10-CM

## 2019-04-14 DIAGNOSIS — Z951 Presence of aortocoronary bypass graft: Secondary | ICD-10-CM

## 2019-04-14 NOTE — Patient Instructions (Signed)
  Medication Instructions:  Your physician recommends that you continue on your current medications as directed. Please refer to the Current Medication list given to you today.  If you need a refill on your cardiac medications before your next appointment, please call your pharmacy.   Lab work: none If you have labs (blood work) drawn today and your tests are completely normal, you will receive your results only by: Marland Kitchen MyChart Message (if you have MyChart) OR . A paper copy in the mail If you have any lab test that is abnormal or we need to change your treatment, we will call you to review the results.  Testing/Procedures: none  Follow-Up: At [facility], you and your health needs are our priority.  As part of our continuing mission to provide you with exceptional heart care, we have created designated Provider Care Teams.  These Care Teams include your primary Cardiologist (physician) and Advanced Practice Providers (APPs -  Physician Assistants and Nurse Practitioners) who all work together to provide you with the care you need, when you need it. You will need a follow up appointment in 6 months with [name], PA-C and in 12 months with Dr. [name].  Please call our office 2 months in advance to schedule each appointment.

## 2019-04-14 NOTE — Progress Notes (Signed)
Virtual Visit via Telephone Note   This visit type was conducted due to national recommendations for restrictions regarding the COVID-19 Pandemic (e.g. social distancing) in an effort to limit this patient's exposure and mitigate transmission in our community.  Due to her co-morbid illnesses, this patient is at least at moderate risk for complications without adequate follow up.  This format is felt to be most appropriate for this patient at this time.  The patient did not have access to video technology/had technical difficulties with video requiring transitioning to audio format only (telephone).  All issues noted in this document were discussed and addressed.  No physical exam could be performed with this format.  Please refer to the patient's chart for her  consent to telehealth for [facility].   Date:  04/14/2019   ID:  Waggaman, DOB Apr 16, 1936, MRN [id number]  Patient Location: Home Provider Location: Home  PCP:  [name], MD  Cardiologist:  [name], MD  Electrophysiologist:  None   Evaluation Performed:  Follow-Up Visit  Chief Complaint: Follow-up ischemic heart disease, chronic [name], hypertension  History of Present Illness:    Lindsay Nguyen is a 83 y.o.  mildly overweight, married Caucasian female, mother of 2, grandmother to 2 grandchildren accompanied by her son [name] today virtually.  I last saw her in the office 02/04/2018.  She did see [name], PA-C in the office 09/13/2018.Marland KitchenHer risk factors include family history, hypertension, hyperlipidemia and noninsulin-requiring diabetes. She had a non-ST-segment-elevation myocardial infarction March 21, 2010, with peak troponin of 4. I brought her to the cath lab the following day and stented her proximal LAD with a bare-metal stent. She did have nonobstructive disease beyond the stent as well as a diagonal branch, AV groove circumflex with an EF of 45% and anteroapical akinesia. Because of a generous  thoracic aorta on cath, I performed a CT angiogram on her revealing her thoracic aorta which measured 4.9 cm. She was seen by Dr. Gilford Raid for evaluation of this at my request. Myoview performed June 11, 2010, showed apical and inferior ischemia as well as anterior ischemia. I noted her June 17, 2010, with rapid onset dyspnea relieved with sublingual nitroglycerin. The patient ruled out for myocardial infarction and was heparinized. I ultimately cath'd her November 16 revealing aggressive "in-stent restenosis" within the proximal LAD stent, as well as in her diagonal branch and AV groove circumflex.   She ultimately underwent coronary artery bypass grafting by Dr. Gilford Raid with LIMA to her LAD, a vein to a diagonal branch, as well as obtuse marginal branch with resection and grafting of her thoracic aortic aneurysm using a 20-mm supracoronary Hemashield 2 graft utilizing deep hypothermic circulatory arrest with resuspension of her native aortic valve and reimplantation of her native coronary arteries. Her postop course was complicated and prolonged, lasting 4 weeks. She did have atrial fibrillation, pneumonia, cholecystitis status post cholecystectomy, as well as pleural effusions. She had a percutaneous drain of her gallbladder and ultimately cholecystectomy by Dr. Excell Seltzer September 18, 2010. She recuperated nicely. She had a Myoview performed November 04, 2010, which was nonischemic and a 2D echo that showed normal LV systolic function with a normal functioning aortic valve.  When I saw her last she was in atrial fibrillation which was confirmed on 30 day event monitoring with tachybradycardia syndrome. She was placed on oral anticoagulation and ultimately underwent successful outpatient cardioversion by Dr. Dorris Carnes 09/30/15. She remains in sinus rhythm/sinus bradycardia. She  was hospitalized in [facility] 10/15/15 for 2 days with upper respiratory tract infection. She did have  diastolic heart failure that time with a BNP of greater than 2000 and was diuresed. A 2-D echo did show moderate to severe pulmonary hypertension without PA systolic pressure of 0000000 mmHg and septal flattening. Apparently a pulmonary embolus was ruled out at that time. A subsequent 2-D echo performed 11/25/15 showed normal pulmonary artery pressures and normal LV function. She did have an outpatient sleep study that did not show sleep apnea. She's had increasing fatigue and dyspnea over the last several weeks.Because she has been symptomatic in [name] I'll arrange for her to undergo outpatient DC cardioversion. This occurred successfully performed by Dr. Sallyanne Kuster on 06/05/16 and she was seen by Roderic Palau in the [name] clinic on 06/16/16.   she was admitted with a TIA. This was characterized by slurred speech and difficulty walking. Her symptoms ultimately resolved. Neurologic workup was unremarkable. Her Eliquis was changed to Xarelto because of compliance issues  When I saw her July 2019.  She had experienced with the client in cognitive function. She lives with her son [name].  She is minimally ambulatory and spends most the day either in a chair or in bed.  Her glucose has been out of control.    She was hospitalized in January of this year for infectious process and ultimately spent 3 years in a skilled nursing facility.  She is a DNR.  Since she saw [name] back in February she has had another hospitalization for an infectious disease process and was treated with antibiotics.  She seems to have regained her baseline.  She lives at home with her son [name].  She is ambulatory occasionally requiring a walker.  I did speak to her on the phone.  She denies chest pain or shortness of breath.  The patient does not have symptoms concerning for COVID-19 infection (fever, chills, cough, or new shortness of breath).    Past Medical History:  Diagnosis Date   Anemia    Atrial fibrillation (HCC)     persistent; on Eliquis   CAD (coronary artery disease)    2D ECHO, 11/04/2010 - EF >55%, mild-moderate mitral regurgitation, moderate tricuspid regurgitation   Dementia (HCC)    Diabetes mellitus    GERD (gastroesophageal reflux disease)    Hyperlipidemia    Hypertension    Osteopenia    Stroke [facility])    Thoracic aortic aneurysm (HCC)    status post resection and grafting   Thyroid disease    hypothyroid   Past Surgical History:  Procedure Laterality Date   CARDIAC CATHETERIZATION  06/18/2010   Recommended CABG   CARDIOVERSION N/A 09/30/2015   Procedure: CARDIOVERSION;  Surgeon: [name], MD;  Location: [place];  Service: Cardiovascular;  Laterality: N/A;   CARDIOVERSION N/A 06/05/2016   Procedure: CARDIOVERSION;  Surgeon: [name], MD;  Location: [place];  Service: Cardiovascular;  Laterality: N/A;   CHOLECYSTECTOMY     CORONARY ARTERY BYPASS GRAFT  06/18/2010   LIMA-LAD, vein-diagonal branch, vein-obtuse marginal branch, and resectioning and grafting of throacic aortic aneurysm   ELBOW SURGERY  1979   fracture left   THORACIC AORTIC ANEURYSM [place]     No outpatient medications have been marked as taking for the 04/14/19 encounter (Appointment) with [name], MD.     Allergies:   Contrast media [iodinated  diagnostic agents], Iodine, Shellfish allergy, and Gabapentin   Social History   Tobacco Use   Smoking status: Never Smoker   Smokeless tobacco: Never Used  Substance Use Topics   Alcohol use: No   Drug use: No     Family Hx: The patient's family history includes Alzheimer's disease in her father; Heart attack in her father; Heart disease in her father; Hypertension in her mother; Osteoporosis in her mother; Stroke in her father.  ROS:   Please see the history of present illness.     All other systems reviewed and are negative.   Prior CV studies:   The following  studies were reviewed today:  None  Labs/Other Tests and Data Reviewed:    EKG:  No ECG reviewed.  Recent Labs: 08/18/2018: TSH 1.620 09/19/2018: Magnesium 1.6 01/13/2019: ALT 10; BUN 20; Creat 1.02; Hemoglobin 12.8; Platelets 298; Potassium 4.0; Sodium 136   Recent Lipid Panel Lab Results  Component Value Date/Time   CHOL 127 11/26/2017 10:46 AM   TRIG 186 (H) 11/26/2017 10:46 AM   HDL 32 (L) 11/26/2017 10:46 AM   CHOLHDL 4.0 11/26/2017 10:46 AM   LDLCALC 70 11/26/2017 10:46 AM   LDLDIRECT 103.6 08/21/2009 08:46 AM    Wt Readings from Last 3 Encounters:  01/13/19 184 lb (83.5 kg)  09/19/18 191 lb (86.6 kg)  09/13/18 188 lb (85.3 kg)     Objective:    Vital Signs:  There were no vitals taken for this visit.   VITAL SIGNS:  reviewed a complete physical exam was not performed today since this was a virtual telemedicine phone visit  ASSESSMENT & PLAN:    1. Atrial fibrillation- history of [name] rate controlled on Xarelto oral anticoagulation.  She has had multiple DC cardioversions in the past. 2. Coronary artery disease- history of LAD stent by myself in the setting of non-STEMI 03/21/2010 with aggressive recent stenosis by cath 06/18/2010 resulting in ultimate CABG and thoracic aortic root replacement by Dr. Arvid Right.  She denies chest pain. 3. Oral anticoagulation- on Eliquis 4. Essential hypertension- blood pressure measured at home by her son [name] today was 136/78 with a pulse in the 70s, irregularly irregular.  She is on losartan and metoprolol.  The amlodipine was discontinued 5. Hyperlipidemia- history of hyperlipidemia on atorvastatin with lipid profile performed 11/26/2017 revealing a total cholesterol 127, LDL 103 and HDL 32  COVID-19 Education: The signs and symptoms of COVID-19 were discussed with the patient and how to seek care for testing (follow up with PCP or arrange E-visit).  The importance of social distancing was discussed today.  Time:   Today, I  have spent 13 minutes with the patient with telehealth technology discussing the above problems.     Medication Adjustments/Labs and Tests Ordered: Current medicines are reviewed at length with the patient today.  Concerns regarding medicines are outlined above.   Tests Ordered: No orders of the defined types were placed in this encounter.   Medication Changes: No orders of the defined types were placed in this encounter.   Follow Up:  In Person in 6 month(s)  Signed, [name], MD  04/14/2019 6:51 AM    [place]

## 2019-04-18 ENCOUNTER — Telehealth: Payer: Self-pay

## 2019-04-18 NOTE — Telephone Encounter (Signed)
Sharyn Lull from [facility] called to get an Rx for pt for a rash in her groin and under her skin fold under stomach. Please advise.

## 2019-04-18 NOTE — Telephone Encounter (Signed)
lotrisone cream bid for 14 days

## 2019-04-19 ENCOUNTER — Other Ambulatory Visit: Payer: Self-pay

## 2019-04-19 MED ORDER — CLOTRIMAZOLE-BETAMETHASONE 1-0.05 % EX CREA: 1.0000 "application " | TOPICAL_CREAM | Freq: Two times a day (BID) | CUTANEOUS | 0 refills | Status: AC

## 2019-04-19 NOTE — Telephone Encounter (Signed)
Rx sent to pharmacy   

## 2019-04-25 ENCOUNTER — Telehealth: Payer: Self-pay | Admitting: Family Medicine

## 2019-04-25 DIAGNOSIS — F0280 Dementia in other diseases classified elsewhere without behavioral disturbance: Secondary | ICD-10-CM

## 2019-04-25 DIAGNOSIS — G301 Alzheimer's disease with late onset: Secondary | ICD-10-CM

## 2019-04-25 NOTE — Telephone Encounter (Signed)
OK for referral?  

## 2019-04-25 NOTE — Telephone Encounter (Signed)
[facility], son would like to know if we can refer patient back to them. He states Santiago Glad his sister was in a bad accident last Thursday and she isn't able to take care of bathing patient she is in the ICU and isn't expected to come home before another month.   CB# [phone]

## 2019-04-25 NOTE — Telephone Encounter (Signed)
Absolutely.

## 2019-04-26 ENCOUNTER — Emergency Department (HOSPITAL_COMMUNITY)
Admission: EM | Admit: 2019-04-26 | Discharge: 2019-04-27 | Disposition: A | Discharge status: Home / Self Care | Payer: Medicare Other | Attending: Emergency Medicine | Admitting: Emergency Medicine

## 2019-04-26 ENCOUNTER — Emergency Department (HOSPITAL_COMMUNITY): Payer: Medicare Other

## 2019-04-26 ENCOUNTER — Encounter (HOSPITAL_COMMUNITY): Payer: Self-pay

## 2019-04-26 DIAGNOSIS — S42292A Other displaced fracture of upper end of left humerus, initial encounter for closed fracture: Secondary | ICD-10-CM | POA: Diagnosis not present

## 2019-04-26 DIAGNOSIS — E119 Type 2 diabetes mellitus without complications: Secondary | ICD-10-CM | POA: Diagnosis not present

## 2019-04-26 DIAGNOSIS — Y9389 Activity, other specified: Secondary | ICD-10-CM | POA: Insufficient documentation

## 2019-04-26 DIAGNOSIS — R52 Pain, unspecified: Secondary | ICD-10-CM | POA: Diagnosis not present

## 2019-04-26 DIAGNOSIS — Z7984 Long term (current) use of oral hypoglycemic drugs: Secondary | ICD-10-CM | POA: Diagnosis not present

## 2019-04-26 DIAGNOSIS — Z7982 Long term (current) use of aspirin: Secondary | ICD-10-CM | POA: Insufficient documentation

## 2019-04-26 DIAGNOSIS — I2510 Atherosclerotic heart disease of native coronary artery without angina pectoris: Secondary | ICD-10-CM | POA: Diagnosis not present

## 2019-04-26 DIAGNOSIS — Y92002 Bathroom of unspecified non-institutional (private) residence single-family (private) house as the place of occurrence of the external cause: Secondary | ICD-10-CM | POA: Insufficient documentation

## 2019-04-26 DIAGNOSIS — S42302A Unspecified fracture of shaft of humerus, left arm, initial encounter for closed fracture: Secondary | ICD-10-CM | POA: Diagnosis not present

## 2019-04-26 DIAGNOSIS — W19XXXA Unspecified fall, initial encounter: Secondary | ICD-10-CM

## 2019-04-26 DIAGNOSIS — S42201A Unspecified fracture of upper end of right humerus, initial encounter for closed fracture: Secondary | ICD-10-CM

## 2019-04-26 DIAGNOSIS — Y999 Unspecified external cause status: Secondary | ICD-10-CM | POA: Diagnosis not present

## 2019-04-26 DIAGNOSIS — I5032 Chronic diastolic (congestive) heart failure: Secondary | ICD-10-CM | POA: Insufficient documentation

## 2019-04-26 DIAGNOSIS — Z951 Presence of aortocoronary bypass graft: Secondary | ICD-10-CM | POA: Insufficient documentation

## 2019-04-26 DIAGNOSIS — S42202A Unspecified fracture of upper end of left humerus, initial encounter for closed fracture: Secondary | ICD-10-CM | POA: Diagnosis not present

## 2019-04-26 DIAGNOSIS — S4992XA Unspecified injury of left shoulder and upper arm, initial encounter: Secondary | ICD-10-CM | POA: Diagnosis present

## 2019-04-26 DIAGNOSIS — J449 Chronic obstructive pulmonary disease, unspecified: Secondary | ICD-10-CM | POA: Diagnosis not present

## 2019-04-26 DIAGNOSIS — I110 Hypertensive heart disease with heart failure: Secondary | ICD-10-CM | POA: Diagnosis not present

## 2019-04-26 DIAGNOSIS — Z79899 Other long term (current) drug therapy: Secondary | ICD-10-CM | POA: Diagnosis not present

## 2019-04-26 DIAGNOSIS — W010XXA Fall on same level from slipping, tripping and stumbling without subsequent striking against object, initial encounter: Secondary | ICD-10-CM | POA: Diagnosis not present

## 2019-04-26 DIAGNOSIS — I10 Essential (primary) hypertension: Secondary | ICD-10-CM | POA: Diagnosis not present

## 2019-04-26 DIAGNOSIS — S0990XA Unspecified injury of head, initial encounter: Secondary | ICD-10-CM | POA: Diagnosis not present

## 2019-04-26 DIAGNOSIS — R51 Headache: Secondary | ICD-10-CM | POA: Diagnosis not present

## 2019-04-26 DIAGNOSIS — I959 Hypotension, unspecified: Secondary | ICD-10-CM | POA: Diagnosis not present

## 2019-04-26 DIAGNOSIS — Z7901 Long term (current) use of anticoagulants: Secondary | ICD-10-CM | POA: Diagnosis not present

## 2019-04-26 DIAGNOSIS — S199XXA Unspecified injury of neck, initial encounter: Secondary | ICD-10-CM | POA: Diagnosis not present

## 2019-04-26 NOTE — Telephone Encounter (Signed)
Referral placed.

## 2019-04-26 NOTE — ED Triage Notes (Signed)
Pt comes via [facility] for fall when getting up to go to the bathroom, shoulder dislocation of L shoulder, did not hit head, no LOC, PTA received 200 fentanyl

## 2019-04-26 NOTE — ED Notes (Signed)
Patient transported to X-ray 

## 2019-04-27 ENCOUNTER — Emergency Department (HOSPITAL_COMMUNITY): Payer: Medicare Other

## 2019-04-27 DIAGNOSIS — S42202A Unspecified fracture of upper end of left humerus, initial encounter for closed fracture: Secondary | ICD-10-CM | POA: Diagnosis not present

## 2019-04-27 DIAGNOSIS — S199XXA Unspecified injury of neck, initial encounter: Secondary | ICD-10-CM | POA: Diagnosis not present

## 2019-04-27 DIAGNOSIS — S42302A Unspecified fracture of shaft of humerus, left arm, initial encounter for closed fracture: Secondary | ICD-10-CM | POA: Diagnosis not present

## 2019-04-27 DIAGNOSIS — S42292A Other displaced fracture of upper end of left humerus, initial encounter for closed fracture: Secondary | ICD-10-CM | POA: Diagnosis not present

## 2019-04-27 DIAGNOSIS — S0990XA Unspecified injury of head, initial encounter: Secondary | ICD-10-CM | POA: Diagnosis not present

## 2019-04-27 MED ORDER — HYDROCODONE-ACETAMINOPHEN 5-325 MG PO TABS: 1.0000 | ORAL_TABLET | ORAL | 0 refills | Status: DC | PRN

## 2019-04-27 MED ORDER — HYDROCODONE-ACETAMINOPHEN 5-325 MG PO TABS
1.0000 | ORAL_TABLET | Freq: Once | ORAL | Status: AC
  Administered 2019-04-27: 1 via ORAL
  Filled 2019-04-27: qty 1

## 2019-04-27 NOTE — ED Notes (Signed)
Patient transported to X-ray 

## 2019-04-27 NOTE — Discharge Instructions (Addendum)
Wear the sling until you see the orthopedic doctor.  Apply ice for thiry minutes at a time, four times a day.  Take acetaminophen as needed for pain, take hydrocodone-acetaminophen as needed for severe pain.

## 2019-04-27 NOTE — ED Provider Notes (Signed)
[facility] EMERGENCY DEPARTMENT Provider Note   CSN: [id number] Arrival date & time: 04/26/19  2314    History   Chief Complaint Chief Complaint  Patient presents with   Fall    HPI Lindsay Nguyen is a 83 y.o. female.   The history is provided by the patient.  She has history of hypertension, hyperlipidemia, atrial fibrillation anticoagulated on rivaroxaban, stroke, coronary artery disease, dementia, systolic heart failure and comes in following a fall at home.  She got up to go to the bathroom and lost her balance and fell.  She states that she has frequent falls.  She is complaining of pain in her left shoulder which she rates at 5/10.  She denies head injury or loss of consciousness.  Past Medical History:  Diagnosis Date   Anemia    Atrial fibrillation ([place])    persistent; on Eliquis   CAD (coronary artery disease)    2D ECHO, 11/04/2010 - EF >55%, mild-moderate mitral regurgitation, moderate tricuspid regurgitation   Dementia (HCC)    Diabetes mellitus    GERD (gastroesophageal reflux disease)    Hyperlipidemia    Hypertension    Osteopenia    Stroke [facility])    Thoracic aortic aneurysm ([place])    status post resection and grafting   Thyroid disease    hypothyroid    Patient Active Problem List   Diagnosis Date Noted   Fever 08/19/2018   Chronic diastolic CHF (congestive heart failure) ([place]) 08/19/2018   Sepsis ([place]) 08/19/2018   Weakness 08/18/2018   Medication noncompliance due to cognitive impairment    TIA (transient ischemic attack) 09/29/2017   Dementia ([place]) 09/29/2017   History of depression 11/17/2016   Bradycardia, sinus [id number]   Acute diastolic (congestive) heart failure ([place]) 10/22/2015   Chronic anticoagulation 10/22/2015   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   Atrial fibrillation, chronic [id number]   Metabolic disorder, iron [id number]   PVD (peripheral vascular disease)-  01/20/2013   Bronchitis  01/20/2013   Bronchopneumonia 04/04/2011   Myalgia 10/26/2010   PLEURAL EFFUSION, RIGHT 08/20/2010   Hx of CABG 05/01/2010   Hypothyroidism 08/20/2009   Controlled diabetes mellitus type II without complication ([place]) [id number]   HLD (hyperlipidemia) 08/20/2009   Essential hypertension 08/20/2009   GERD 08/20/2009   OSTEOPENIA 08/20/2009    Past Surgical History:  Procedure Laterality Date   CARDIAC CATHETERIZATION  06/18/2010   Recommended CABG   CARDIOVERSION N/A 09/30/2015   Procedure: CARDIOVERSION;  Surgeon: [name], MD;  Location: [place];  Service: Cardiovascular;  Laterality: N/A;   CARDIOVERSION N/A 06/05/2016   Procedure: CARDIOVERSION;  Surgeon: [name], MD;  Location: [place];  Service: Cardiovascular;  Laterality: N/A;   CHOLECYSTECTOMY     CORONARY ARTERY BYPASS GRAFT  06/18/2010   LIMA-LAD, vein-diagonal branch, vein-obtuse marginal branch, and resectioning and grafting of throacic aortic aneurysm   ELBOW SURGERY  1979   fracture left   THORACIC AORTIC ANEURYSM [place]     OB History   No obstetric history on file.      Home Medications    Prior to Admission medications   Medication Sig Start Date End Date Taking? Authorizing Provider  albuterol (PROVENTIL) (2.5 MG/3ML) 0.083% nebulizer solution Take 2.5 mg by nebulization every 2 (two) hours as needed for wheezing or shortness of breath.  05/24/16   [provider]  amLODipine (NORVASC) 5 MG tablet  Take 1 tablet (5 mg total) by mouth daily. 08/26/18   Cristal Ford, DO  aspirin EC 81 MG tablet Take 1 tablet (81 mg total) by mouth daily. 08/07/15   [name], MD  atorvastatin (LIPITOR) 40 MG tablet Take 1 tablet (40 mg total) by mouth daily at 6 PM. 08/18/18   Kilroy, Doreene Burke, PA-C  budesonide (PULMICORT) 0.25 MG/2ML nebulizer solution Take 0.25 mg by nebulization daily as needed (SOB).     [provider]  clotrimazole-betamethasone (LOTRISONE) cream Apply 1 application topically 2 (two) times daily. 04/19/19   [name], MD  dicyclomine (BENTYL) 20 MG tablet TAKE 1 TABLET(20 MG) BY MOUTH FOUR TIMES DAILY AS NEEDED FOR SPASMS 03/01/19   [name], MD  donepezil (ARICEPT) 10 MG tablet TAKE 1 TABLET(10 MG) BY MOUTH AT BEDTIME 11/17/18   [name], MD  escitalopram (LEXAPRO) 10 MG tablet TAKE 1 TABLET(10 MG) BY MOUTH DAILY 11/17/18   [name], MD  JANUVIA 100 MG tablet TAKE 1 TABLET BY MOUTH DAILY 04/07/19   [name], MD  JARDIANCE 25 MG TABS tablet TAKE 1/2 TABLET BY MOUTH DAILY 01/02/19   [name], MD  levothyroxine (SYNTHROID) 112 MCG tablet Take 0.5 tablets (56 mcg total) by mouth daily before breakfast. 08/26/18   Nita Sells, MD  loratadine (CLARITIN) 10 MG tablet Take 10 mg by mouth daily.     [provider]  losartan (COZAAR) 50 MG tablet TAKE 1 TABLET BY MOUTH DAILY(DISCONTINUE VALSARTAN) 11/17/18   [name], MD  magnesium oxide (MAG-OX) 400 MG tablet Take 400 mg by mouth daily.    [provider]  memantine (NAMENDA) 10 MG tablet TAKE 1 TABLET(10 MG) BY MOUTH TWICE DAILY 01/23/19   [name], MD  metFORMIN (GLUCOPHAGE) 500 MG tablet TAKE 2 TABLETS(1000 MG) BY MOUTH TWICE DAILY WITH A MEAL 01/23/19   [name], MD  metoCLOPramide (REGLAN) 5 MG tablet TAKE 1 TABLET(5 MG) BY MOUTH FOUR TIMES DAILY BEFORE MEALS AND AT BEDTIME Patient taking differently: Take 5 mg by mouth 3 (three) times daily.  08/17/18   [name], MD  metoprolol succinate (TOPROL-XL) 25 MG 24 hr tablet Take 1 tablet (25 mg total) by mouth daily. 08/26/18   Nita Sells, MD  metoprolol tartrate (LOPRESSOR) 25 MG tablet Take 1 tablet (25 mg total) by mouth daily. 01/20/19 04/20/19  [name], MD  nitroGLYCERIN (NITROSTAT) 0.4 MG SL tablet Place 1 tablet (0.4 mg total) under the tongue every 5 (five)  minutes as needed for chest pain (MAX 3 TABLETS). 10/26/17   [name], MD  SYNTHROID 112 MCG tablet TAKE 1/2 TABLET BY MOUTH DAILY BEFORE BREAKFAST 07/20/18   [name], MD  XARELTO 20 MG TABS tablet TAKE 1 TABLET(20 MG) BY MOUTH DAILY WITH SUPPER 05/12/18   [name], MD    Family History Family History  Problem Relation Age of Onset   Hypertension Mother    Osteoporosis Mother    Heart disease Father    Stroke Father    Alzheimer's disease Father    Heart attack Father     Social History Social History   Tobacco Use   Smoking status: Never Smoker   Smokeless tobacco: Never Used  Substance Use Topics   Alcohol use: No   Drug use: No     Allergies   Contrast media [iodinated diagnostic agents], Iodine, Shellfish allergy, and Gabapentin  Review of Systems Review of Systems  All other systems reviewed and are negative.    Physical Exam Updated Vital Signs BP (!) 144/87    Pulse 89    Temp 98.8 F (37.1 C) (Oral)    Resp 20    SpO2 97%   Physical Exam Vitals signs and nursing note reviewed.    83 year old female, resting comfortably and in no acute distress. Vital signs are significant for mildly elevated blood pressure. Oxygen saturation is 97%, which is normal. Head is normocephalic and atraumatic. PERRLA, EOMI. Oropharynx is clear. Neck is nontender without adenopathy or JVD. Back is nontender and there is no CVA tenderness. Lungs are clear without rales, wheezes, or rhonchi. Chest is nontender. Heart has regular rate and rhythm without murmur. Abdomen is soft, flat, nontender without masses or hepatosplenomegaly and peristalsis is normoactive. Extremities: Left arm is being held in a sling.  There is tenderness palpation over the region of the left humeral head.  There is also tenderness palpation over the left elbow with pain with movement at either joint.  Less severe pain is present in the mid humerus.  Remainder of  extremity exam is normal.  Neurovascular exam is intact with strong pulses, prompt capillary refill, normal sensation, normal motor function. Skin is warm and dry without rash. Neurologic: Mental status is normal, cranial nerves are intact, there are no motor or sensory deficits.  ED Treatments / Results   Radiology Ct Head Wo Contrast  Result Date: 04/27/2019 CLINICAL DATA:  83 year old female with head trauma. EXAM: CT HEAD WITHOUT CONTRAST CT CERVICAL SPINE WITHOUT CONTRAST TECHNIQUE: Multidetector CT imaging of the head and cervical spine was performed following the standard protocol without intravenous contrast. Multiplanar CT image reconstructions of the cervical spine were also generated. COMPARISON:  Head CT dated 08/15/2018 FINDINGS: CT HEAD FINDINGS Brain: There is mild age-related atrophy and chronic microvascular ischemic changes. There is no acute intracranial hemorrhage. No mass effect or midline shift. No extra-axial fluid collection. Vascular: No hyperdense vessel or unexpected calcification. Skull: Normal. Negative for fracture or focal lesion. Sinuses/Orbits: No acute finding. Other: None CT CERVICAL SPINE FINDINGS Alignment: No acute subluxation. Skull base and vertebrae: No acute fracture. Osteopenia. Soft tissues and spinal canal: No prevertebral fluid or swelling. No visible canal hematoma. Disc levels: Multilevel degenerative changes with endplate irregularity and disc space narrowing and osteophyte. Multilevel facet arthropathy. Upper chest: Negative. Other: Enlarged nodular thyroid most consistent with multinodular goiter. Ultrasound may provide better evaluation on a nonemergent basis. IMPRESSION: 1. No acute intracranial hemorrhage. 2. No acute/traumatic cervical spine pathology. Electronically Signed   By: Anner Crete M.D.   On: 04/27/2019 02:10   Ct Cervical Spine Wo Contrast  Result Date: 04/27/2019 CLINICAL DATA:  83 year old female with head trauma. EXAM: CT HEAD  WITHOUT CONTRAST CT CERVICAL SPINE WITHOUT CONTRAST TECHNIQUE: Multidetector CT imaging of the head and cervical spine was performed following the standard protocol without intravenous contrast. Multiplanar CT image reconstructions of the cervical spine were also generated. COMPARISON:  Head CT dated 08/15/2018 FINDINGS: CT HEAD FINDINGS Brain: There is mild age-related atrophy and chronic microvascular ischemic changes. There is no acute intracranial hemorrhage. No mass effect or midline shift. No extra-axial fluid collection. Vascular: No hyperdense vessel or unexpected calcification. Skull: Normal. Negative for fracture or focal lesion. Sinuses/Orbits: No acute finding. Other: None CT CERVICAL SPINE FINDINGS Alignment: No acute subluxation. Skull base and vertebrae: No acute fracture. Osteopenia. Soft tissues and spinal canal: No prevertebral  fluid or swelling. No visible canal hematoma. Disc levels: Multilevel degenerative changes with endplate irregularity and disc space narrowing and osteophyte. Multilevel facet arthropathy. Upper chest: Negative. Other: Enlarged nodular thyroid most consistent with multinodular goiter. Ultrasound may provide better evaluation on a nonemergent basis. IMPRESSION: 1. No acute intracranial hemorrhage. 2. No acute/traumatic cervical spine pathology. Electronically Signed   By: Anner Crete M.D.   On: 04/27/2019 02:10   Dg Shoulder Left  Result Date: 04/26/2019 CLINICAL DATA:  Fall EXAM: LEFT SHOULDER - 2+ VIEW COMPARISON:  None. FINDINGS: There is a fracture through the proximal left humeral shaft/humeral neck region. This is minimally displaced. No subluxation or dislocation. IMPRESSION: Proximal left humeral shaft/humeral neck fracture. Electronically Signed   By: Rolm Baptise M.D.   On: 04/26/2019 23:54   Dg Humerus Left  Result Date: 04/27/2019 CLINICAL DATA:  Left arm pain after fall. EXAM: LEFT HUMERUS - 2+ VIEW COMPARISON:  Shoulder radiograph yesterday.  FINDINGS: Mildly displaced fracture of the proximal diaphysis/humeral neck is unchanged from prior exam. There is likely nondisplaced involvement of the lateral humeral head. Minimal angulation. Distal humerus is intact. IMPRESSION: Mild displaced proximal humerus fracture involving the proximal diaphysis, neck, and lateral humeral head. Electronically Signed   By: Keith Rake M.D.   On: 04/27/2019 00:48    Procedures Procedures (including critical care time)  Medications Ordered in ED Medications  HYDROcodone-acetaminophen (NORCO/VICODIN) 5-325 MG per tablet 1 tablet (has no administration in time range)     Initial Impression / Assessment and Plan / ED Course  I have reviewed the triage vital signs and the nursing notes.  Pertinent labs & imaging results that were available during my care of the patient were reviewed by me and considered in my medical decision making (see chart for details).  Fall with injury to left arm.  Patient anticoagulated.  Shoulder x-rays showed a proximal left humeral shaft and neck fracture.  However, on exam, I am concerned about concomitant injury to the distal humerus and elbow so she will be sent back for additional x-rays.  Also, because of anticoagulated state, will check CT of head.  Old records are reviewed confirming outpatient management of PACs and all atrial fibrillation and anticoagulation on rivaroxaban.  CT of head and cervical spine showed no evidence of acute injury.  Humerus x-rays show evidence of prior elbow injury and fracture that was seen on shoulder x-ray.  She is placed in a sling.  Her son has arrived.  He is caretaker and plan was discussed with him.  She is referred to orthopedics for follow-up.  Also recommended discussed with cardiology whether her risk of bleeding from falls is sufficient to warrant stopping use of the systemic anticoagulation.  Advised on ice and elevation.  Given prescription for hydrocodone-acetaminophen for use  for severe pain.  Referred to orthopedics for follow-up.  Final Clinical Impressions(s) / ED Diagnoses   Final diagnoses:  Fall in home, initial encounter  Closed fracture of proximal end of right humerus, initial encounter  Chronic anticoagulation    ED Discharge Orders         Ordered    HYDROcodone-acetaminophen (NORCO) 5-325 MG tablet  Every 4 hours PRN,   Status:  Discontinued     04/27/19 0237    HYDROcodone-acetaminophen (NORCO) 5-325 MG tablet  Every 4 hours PRN     04/27/19 [id number]           Delora Fuel, MD 0000000 [phone]

## 2019-04-28 ENCOUNTER — Other Ambulatory Visit: Payer: Self-pay | Admitting: Family Medicine

## 2019-04-28 MED ORDER — HYDROCODONE-ACETAMINOPHEN 5-325 MG PO TABS: 1.0000 | ORAL_TABLET | ORAL | 0 refills | Status: AC | PRN

## 2019-04-28 NOTE — Telephone Encounter (Signed)
Patient's daughter called in very angry in regards to patient's [place] referral. I informed her that patient per Medicare would need a new office visit since she discharged from [facility] on 03/13/2019 for meeting her goals. Patient daughter and her son are primary caregivers for her. Her daughter had a MVA and was in ICU and now is unable to care for her mother at this time due to injuries she sustained. She was angry stating that they do not know how they will get patient here because her brother can't get her here alone because she can't stand long. With sympathy I explained to patient that unfortunately we do not do video visits and patient would have to come. I informed her that once her brother arrives with her we can assist him with getting her in a wheelchair and getting her in the office. She was still angry with this response. However there is no other work around. I offered her an appointment on Monday but she did not want to schedule. Please advise? Also patient needs a refill on her hydrocodone as she is out.

## 2019-05-01 ENCOUNTER — Ambulatory Visit: Payer: Self-pay | Admitting: Family Medicine

## 2019-05-01 DIAGNOSIS — I2510 Atherosclerotic heart disease of native coronary artery without angina pectoris: Secondary | ICD-10-CM | POA: Diagnosis not present

## 2019-05-01 DIAGNOSIS — E1151 Type 2 diabetes mellitus with diabetic peripheral angiopathy without gangrene: Secondary | ICD-10-CM | POA: Diagnosis not present

## 2019-05-01 DIAGNOSIS — I130 Hypertensive heart and chronic kidney disease with heart failure and stage 1 through stage 4 chronic kidney disease, or unspecified chronic kidney disease: Secondary | ICD-10-CM | POA: Diagnosis not present

## 2019-05-01 DIAGNOSIS — E785 Hyperlipidemia, unspecified: Secondary | ICD-10-CM | POA: Diagnosis not present

## 2019-05-01 DIAGNOSIS — I4891 Unspecified atrial fibrillation: Secondary | ICD-10-CM | POA: Diagnosis not present

## 2019-05-01 DIAGNOSIS — L89152 Pressure ulcer of sacral region, stage 2: Secondary | ICD-10-CM | POA: Diagnosis not present

## 2019-05-01 DIAGNOSIS — D6869 Other thrombophilia: Secondary | ICD-10-CM | POA: Diagnosis not present

## 2019-05-01 DIAGNOSIS — I700 Atherosclerosis of aorta: Secondary | ICD-10-CM | POA: Diagnosis not present

## 2019-05-01 DIAGNOSIS — I5032 Chronic diastolic (congestive) heart failure: Secondary | ICD-10-CM | POA: Diagnosis not present

## 2019-05-01 DIAGNOSIS — J449 Chronic obstructive pulmonary disease, unspecified: Secondary | ICD-10-CM | POA: Diagnosis not present

## 2019-05-01 DIAGNOSIS — E1122 Type 2 diabetes mellitus with diabetic chronic kidney disease: Secondary | ICD-10-CM | POA: Diagnosis not present

## 2019-05-02 DIAGNOSIS — R52 Pain, unspecified: Secondary | ICD-10-CM | POA: Diagnosis not present

## 2019-05-02 DIAGNOSIS — T07XXXA Unspecified multiple injuries, initial encounter: Secondary | ICD-10-CM | POA: Diagnosis not present

## 2019-05-02 DIAGNOSIS — M25532 Pain in left wrist: Secondary | ICD-10-CM | POA: Diagnosis not present

## 2019-05-02 DIAGNOSIS — R279 Unspecified lack of coordination: Secondary | ICD-10-CM | POA: Diagnosis not present

## 2019-05-02 DIAGNOSIS — R0902 Hypoxemia: Secondary | ICD-10-CM | POA: Diagnosis not present

## 2019-05-02 DIAGNOSIS — M25512 Pain in left shoulder: Secondary | ICD-10-CM | POA: Diagnosis not present

## 2019-05-02 DIAGNOSIS — M25522 Pain in left elbow: Secondary | ICD-10-CM | POA: Diagnosis not present

## 2019-05-02 DIAGNOSIS — Z743 Need for continuous supervision: Secondary | ICD-10-CM | POA: Diagnosis not present

## 2019-05-02 DIAGNOSIS — W19XXXA Unspecified fall, initial encounter: Secondary | ICD-10-CM | POA: Diagnosis not present

## 2019-05-04 DIAGNOSIS — Z951 Presence of aortocoronary bypass graft: Secondary | ICD-10-CM | POA: Diagnosis not present

## 2019-05-04 DIAGNOSIS — N390 Urinary tract infection, site not specified: Secondary | ICD-10-CM | POA: Diagnosis not present

## 2019-05-04 DIAGNOSIS — W19XXXD Unspecified fall, subsequent encounter: Secondary | ICD-10-CM | POA: Diagnosis not present

## 2019-05-04 DIAGNOSIS — S42452D Displaced fracture of lateral condyle of left humerus, subsequent encounter for fracture with routine healing: Secondary | ICD-10-CM | POA: Diagnosis not present

## 2019-05-04 DIAGNOSIS — E1122 Type 2 diabetes mellitus with diabetic chronic kidney disease: Secondary | ICD-10-CM | POA: Diagnosis not present

## 2019-05-04 DIAGNOSIS — I130 Hypertensive heart and chronic kidney disease with heart failure and stage 1 through stage 4 chronic kidney disease, or unspecified chronic kidney disease: Secondary | ICD-10-CM | POA: Diagnosis not present

## 2019-05-04 DIAGNOSIS — E785 Hyperlipidemia, unspecified: Secondary | ICD-10-CM | POA: Diagnosis not present

## 2019-05-04 DIAGNOSIS — G301 Alzheimer's disease with late onset: Secondary | ICD-10-CM | POA: Diagnosis not present

## 2019-05-04 DIAGNOSIS — I5032 Chronic diastolic (congestive) heart failure: Secondary | ICD-10-CM | POA: Diagnosis not present

## 2019-05-04 DIAGNOSIS — J45909 Unspecified asthma, uncomplicated: Secondary | ICD-10-CM | POA: Diagnosis not present

## 2019-05-04 DIAGNOSIS — I252 Old myocardial infarction: Secondary | ICD-10-CM | POA: Diagnosis not present

## 2019-05-04 DIAGNOSIS — D631 Anemia in chronic kidney disease: Secondary | ICD-10-CM | POA: Diagnosis not present

## 2019-05-04 DIAGNOSIS — K219 Gastro-esophageal reflux disease without esophagitis: Secondary | ICD-10-CM | POA: Diagnosis not present

## 2019-05-04 DIAGNOSIS — I480 Paroxysmal atrial fibrillation: Secondary | ICD-10-CM | POA: Diagnosis not present

## 2019-05-04 DIAGNOSIS — M8580 Other specified disorders of bone density and structure, unspecified site: Secondary | ICD-10-CM | POA: Diagnosis not present

## 2019-05-04 DIAGNOSIS — I2510 Atherosclerotic heart disease of native coronary artery without angina pectoris: Secondary | ICD-10-CM | POA: Diagnosis not present

## 2019-05-04 DIAGNOSIS — E1151 Type 2 diabetes mellitus with diabetic peripheral angiopathy without gangrene: Secondary | ICD-10-CM | POA: Diagnosis not present

## 2019-05-04 DIAGNOSIS — J449 Chronic obstructive pulmonary disease, unspecified: Secondary | ICD-10-CM | POA: Diagnosis not present

## 2019-05-04 DIAGNOSIS — Z9049 Acquired absence of other specified parts of digestive tract: Secondary | ICD-10-CM | POA: Diagnosis not present

## 2019-05-04 DIAGNOSIS — F329 Major depressive disorder, single episode, unspecified: Secondary | ICD-10-CM | POA: Diagnosis not present

## 2019-05-04 DIAGNOSIS — Z9181 History of falling: Secondary | ICD-10-CM | POA: Diagnosis not present

## 2019-05-04 DIAGNOSIS — E049 Nontoxic goiter, unspecified: Secondary | ICD-10-CM | POA: Diagnosis not present

## 2019-05-04 DIAGNOSIS — F0280 Dementia in other diseases classified elsewhere without behavioral disturbance: Secondary | ICD-10-CM | POA: Diagnosis not present

## 2019-05-04 DIAGNOSIS — N1831 Chronic kidney disease, stage 3a: Secondary | ICD-10-CM | POA: Diagnosis not present

## 2019-05-04 DIAGNOSIS — I700 Atherosclerosis of aorta: Secondary | ICD-10-CM | POA: Diagnosis not present

## 2019-05-08 DIAGNOSIS — S42452D Displaced fracture of lateral condyle of left humerus, subsequent encounter for fracture with routine healing: Secondary | ICD-10-CM | POA: Diagnosis not present

## 2019-05-08 DIAGNOSIS — F0280 Dementia in other diseases classified elsewhere without behavioral disturbance: Secondary | ICD-10-CM | POA: Diagnosis not present

## 2019-05-08 DIAGNOSIS — N390 Urinary tract infection, site not specified: Secondary | ICD-10-CM | POA: Diagnosis not present

## 2019-05-08 DIAGNOSIS — J449 Chronic obstructive pulmonary disease, unspecified: Secondary | ICD-10-CM | POA: Diagnosis not present

## 2019-05-08 DIAGNOSIS — E1151 Type 2 diabetes mellitus with diabetic peripheral angiopathy without gangrene: Secondary | ICD-10-CM | POA: Diagnosis not present

## 2019-05-08 DIAGNOSIS — G301 Alzheimer's disease with late onset: Secondary | ICD-10-CM | POA: Diagnosis not present

## 2019-05-09 DIAGNOSIS — N390 Urinary tract infection, site not specified: Secondary | ICD-10-CM | POA: Diagnosis not present

## 2019-05-09 DIAGNOSIS — F0280 Dementia in other diseases classified elsewhere without behavioral disturbance: Secondary | ICD-10-CM | POA: Diagnosis not present

## 2019-05-09 DIAGNOSIS — J449 Chronic obstructive pulmonary disease, unspecified: Secondary | ICD-10-CM | POA: Diagnosis not present

## 2019-05-09 DIAGNOSIS — G301 Alzheimer's disease with late onset: Secondary | ICD-10-CM | POA: Diagnosis not present

## 2019-05-09 DIAGNOSIS — E1151 Type 2 diabetes mellitus with diabetic peripheral angiopathy without gangrene: Secondary | ICD-10-CM | POA: Diagnosis not present

## 2019-05-09 DIAGNOSIS — S42452D Displaced fracture of lateral condyle of left humerus, subsequent encounter for fracture with routine healing: Secondary | ICD-10-CM | POA: Diagnosis not present

## 2019-05-10 DIAGNOSIS — E1151 Type 2 diabetes mellitus with diabetic peripheral angiopathy without gangrene: Secondary | ICD-10-CM | POA: Diagnosis not present

## 2019-05-10 DIAGNOSIS — J449 Chronic obstructive pulmonary disease, unspecified: Secondary | ICD-10-CM | POA: Diagnosis not present

## 2019-05-10 DIAGNOSIS — S42452D Displaced fracture of lateral condyle of left humerus, subsequent encounter for fracture with routine healing: Secondary | ICD-10-CM | POA: Diagnosis not present

## 2019-05-10 DIAGNOSIS — N390 Urinary tract infection, site not specified: Secondary | ICD-10-CM | POA: Diagnosis not present

## 2019-05-10 DIAGNOSIS — F0280 Dementia in other diseases classified elsewhere without behavioral disturbance: Secondary | ICD-10-CM | POA: Diagnosis not present

## 2019-05-10 DIAGNOSIS — G301 Alzheimer's disease with late onset: Secondary | ICD-10-CM | POA: Diagnosis not present

## 2019-05-12 DIAGNOSIS — S42452D Displaced fracture of lateral condyle of left humerus, subsequent encounter for fracture with routine healing: Secondary | ICD-10-CM | POA: Diagnosis not present

## 2019-05-12 DIAGNOSIS — J449 Chronic obstructive pulmonary disease, unspecified: Secondary | ICD-10-CM | POA: Diagnosis not present

## 2019-05-12 DIAGNOSIS — N390 Urinary tract infection, site not specified: Secondary | ICD-10-CM | POA: Diagnosis not present

## 2019-05-12 DIAGNOSIS — E1151 Type 2 diabetes mellitus with diabetic peripheral angiopathy without gangrene: Secondary | ICD-10-CM | POA: Diagnosis not present

## 2019-05-12 DIAGNOSIS — F0280 Dementia in other diseases classified elsewhere without behavioral disturbance: Secondary | ICD-10-CM | POA: Diagnosis not present

## 2019-05-12 DIAGNOSIS — G301 Alzheimer's disease with late onset: Secondary | ICD-10-CM | POA: Diagnosis not present

## 2019-05-15 DIAGNOSIS — J449 Chronic obstructive pulmonary disease, unspecified: Secondary | ICD-10-CM | POA: Diagnosis not present

## 2019-05-15 DIAGNOSIS — S42452D Displaced fracture of lateral condyle of left humerus, subsequent encounter for fracture with routine healing: Secondary | ICD-10-CM | POA: Diagnosis not present

## 2019-05-15 DIAGNOSIS — F0280 Dementia in other diseases classified elsewhere without behavioral disturbance: Secondary | ICD-10-CM | POA: Diagnosis not present

## 2019-05-15 DIAGNOSIS — E1151 Type 2 diabetes mellitus with diabetic peripheral angiopathy without gangrene: Secondary | ICD-10-CM | POA: Diagnosis not present

## 2019-05-15 DIAGNOSIS — G301 Alzheimer's disease with late onset: Secondary | ICD-10-CM | POA: Diagnosis not present

## 2019-05-15 DIAGNOSIS — N390 Urinary tract infection, site not specified: Secondary | ICD-10-CM | POA: Diagnosis not present

## 2019-05-16 ENCOUNTER — Other Ambulatory Visit: Payer: Self-pay | Admitting: Family Medicine

## 2019-05-16 DIAGNOSIS — R05 Cough: Secondary | ICD-10-CM | POA: Diagnosis not present

## 2019-05-16 DIAGNOSIS — S42309A Unspecified fracture of shaft of humerus, unspecified arm, initial encounter for closed fracture: Secondary | ICD-10-CM | POA: Diagnosis not present

## 2019-05-16 DIAGNOSIS — R532 Functional quadriplegia: Secondary | ICD-10-CM | POA: Diagnosis not present

## 2019-05-16 DIAGNOSIS — N390 Urinary tract infection, site not specified: Secondary | ICD-10-CM | POA: Diagnosis not present

## 2019-05-16 DIAGNOSIS — B952 Enterococcus as the cause of diseases classified elsewhere: Secondary | ICD-10-CM | POA: Diagnosis not present

## 2019-05-16 DIAGNOSIS — W19XXXA Unspecified fall, initial encounter: Secondary | ICD-10-CM | POA: Diagnosis not present

## 2019-05-16 DIAGNOSIS — I482 Chronic atrial fibrillation, unspecified: Secondary | ICD-10-CM | POA: Diagnosis not present

## 2019-05-16 DIAGNOSIS — Z20828 Contact with and (suspected) exposure to other viral communicable diseases: Secondary | ICD-10-CM | POA: Diagnosis not present

## 2019-05-16 DIAGNOSIS — R9431 Abnormal electrocardiogram [ECG] [EKG]: Secondary | ICD-10-CM | POA: Diagnosis not present

## 2019-05-16 DIAGNOSIS — E876 Hypokalemia: Secondary | ICD-10-CM | POA: Diagnosis not present

## 2019-05-16 DIAGNOSIS — F0390 Unspecified dementia without behavioral disturbance: Secondary | ICD-10-CM | POA: Diagnosis not present

## 2019-05-16 DIAGNOSIS — A0839 Other viral enteritis: Secondary | ICD-10-CM | POA: Diagnosis not present

## 2019-05-16 DIAGNOSIS — R197 Diarrhea, unspecified: Secondary | ICD-10-CM | POA: Diagnosis not present

## 2019-05-16 DIAGNOSIS — R531 Weakness: Secondary | ICD-10-CM | POA: Diagnosis not present

## 2019-05-16 DIAGNOSIS — L89152 Pressure ulcer of sacral region, stage 2: Secondary | ICD-10-CM | POA: Diagnosis not present

## 2019-05-17 ENCOUNTER — Other Ambulatory Visit: Payer: Self-pay | Admitting: Family Medicine

## 2019-05-17 DIAGNOSIS — J449 Chronic obstructive pulmonary disease, unspecified: Secondary | ICD-10-CM | POA: Diagnosis not present

## 2019-05-17 DIAGNOSIS — Z951 Presence of aortocoronary bypass graft: Secondary | ICD-10-CM | POA: Diagnosis not present

## 2019-05-17 DIAGNOSIS — I10 Essential (primary) hypertension: Secondary | ICD-10-CM | POA: Diagnosis present

## 2019-05-17 DIAGNOSIS — Z8673 Personal history of transient ischemic attack (TIA), and cerebral infarction without residual deficits: Secondary | ICD-10-CM | POA: Diagnosis not present

## 2019-05-17 DIAGNOSIS — I482 Chronic atrial fibrillation, unspecified: Secondary | ICD-10-CM | POA: Diagnosis present

## 2019-05-17 DIAGNOSIS — I2510 Atherosclerotic heart disease of native coronary artery without angina pectoris: Secondary | ICD-10-CM | POA: Diagnosis present

## 2019-05-17 DIAGNOSIS — E785 Hyperlipidemia, unspecified: Secondary | ICD-10-CM | POA: Diagnosis present

## 2019-05-17 DIAGNOSIS — J45909 Unspecified asthma, uncomplicated: Secondary | ICD-10-CM | POA: Diagnosis present

## 2019-05-17 DIAGNOSIS — I252 Old myocardial infarction: Secondary | ICD-10-CM | POA: Diagnosis not present

## 2019-05-17 DIAGNOSIS — R197 Diarrhea, unspecified: Secondary | ICD-10-CM | POA: Diagnosis not present

## 2019-05-17 DIAGNOSIS — Z7901 Long term (current) use of anticoagulants: Secondary | ICD-10-CM | POA: Diagnosis not present

## 2019-05-17 DIAGNOSIS — Z7984 Long term (current) use of oral hypoglycemic drugs: Secondary | ICD-10-CM | POA: Diagnosis not present

## 2019-05-17 DIAGNOSIS — F329 Major depressive disorder, single episode, unspecified: Secondary | ICD-10-CM | POA: Diagnosis present

## 2019-05-17 DIAGNOSIS — N390 Urinary tract infection, site not specified: Secondary | ICD-10-CM | POA: Diagnosis present

## 2019-05-17 DIAGNOSIS — B952 Enterococcus as the cause of diseases classified elsewhere: Secondary | ICD-10-CM | POA: Diagnosis present

## 2019-05-17 DIAGNOSIS — Z7401 Bed confinement status: Secondary | ICD-10-CM | POA: Diagnosis not present

## 2019-05-17 DIAGNOSIS — R531 Weakness: Secondary | ICD-10-CM | POA: Diagnosis not present

## 2019-05-17 DIAGNOSIS — E876 Hypokalemia: Secondary | ICD-10-CM | POA: Diagnosis present

## 2019-05-17 DIAGNOSIS — E1151 Type 2 diabetes mellitus with diabetic peripheral angiopathy without gangrene: Secondary | ICD-10-CM | POA: Diagnosis present

## 2019-05-17 DIAGNOSIS — Z955 Presence of coronary angioplasty implant and graft: Secondary | ICD-10-CM | POA: Diagnosis not present

## 2019-05-17 DIAGNOSIS — Z9981 Dependence on supplemental oxygen: Secondary | ICD-10-CM | POA: Diagnosis not present

## 2019-05-17 DIAGNOSIS — A0839 Other viral enteritis: Secondary | ICD-10-CM | POA: Diagnosis present

## 2019-05-17 DIAGNOSIS — F0390 Unspecified dementia without behavioral disturbance: Secondary | ICD-10-CM | POA: Diagnosis present

## 2019-05-17 DIAGNOSIS — M2550 Pain in unspecified joint: Secondary | ICD-10-CM | POA: Diagnosis not present

## 2019-05-17 DIAGNOSIS — Z20828 Contact with and (suspected) exposure to other viral communicable diseases: Secondary | ICD-10-CM | POA: Diagnosis present

## 2019-05-17 DIAGNOSIS — E119 Type 2 diabetes mellitus without complications: Secondary | ICD-10-CM | POA: Diagnosis not present

## 2019-05-17 DIAGNOSIS — R410 Disorientation, unspecified: Secondary | ICD-10-CM | POA: Diagnosis not present

## 2019-05-17 DIAGNOSIS — E039 Hypothyroidism, unspecified: Secondary | ICD-10-CM | POA: Diagnosis present

## 2019-05-17 DIAGNOSIS — Z79890 Hormone replacement therapy (postmenopausal): Secondary | ICD-10-CM | POA: Diagnosis not present

## 2019-05-24 DIAGNOSIS — E46 Unspecified protein-calorie malnutrition: Secondary | ICD-10-CM | POA: Diagnosis not present

## 2019-05-24 DIAGNOSIS — F0390 Unspecified dementia without behavioral disturbance: Secondary | ICD-10-CM | POA: Diagnosis not present

## 2019-05-24 DIAGNOSIS — Z9981 Dependence on supplemental oxygen: Secondary | ICD-10-CM | POA: Diagnosis not present

## 2019-05-24 DIAGNOSIS — J449 Chronic obstructive pulmonary disease, unspecified: Secondary | ICD-10-CM | POA: Diagnosis not present

## 2019-05-24 DIAGNOSIS — R197 Diarrhea, unspecified: Secondary | ICD-10-CM | POA: Diagnosis not present

## 2019-05-24 DIAGNOSIS — J9611 Chronic respiratory failure with hypoxia: Secondary | ICD-10-CM | POA: Diagnosis not present

## 2019-05-24 DIAGNOSIS — L89152 Pressure ulcer of sacral region, stage 2: Secondary | ICD-10-CM | POA: Diagnosis not present

## 2019-05-24 DIAGNOSIS — R532 Functional quadriplegia: Secondary | ICD-10-CM | POA: Diagnosis not present

## 2019-05-25 DIAGNOSIS — J449 Chronic obstructive pulmonary disease, unspecified: Secondary | ICD-10-CM | POA: Diagnosis not present

## 2019-05-25 DIAGNOSIS — N390 Urinary tract infection, site not specified: Secondary | ICD-10-CM | POA: Diagnosis not present

## 2019-05-25 DIAGNOSIS — G301 Alzheimer's disease with late onset: Secondary | ICD-10-CM | POA: Diagnosis not present

## 2019-05-25 DIAGNOSIS — E1151 Type 2 diabetes mellitus with diabetic peripheral angiopathy without gangrene: Secondary | ICD-10-CM | POA: Diagnosis not present

## 2019-05-25 DIAGNOSIS — F0280 Dementia in other diseases classified elsewhere without behavioral disturbance: Secondary | ICD-10-CM | POA: Diagnosis not present

## 2019-05-25 DIAGNOSIS — S42452D Displaced fracture of lateral condyle of left humerus, subsequent encounter for fracture with routine healing: Secondary | ICD-10-CM | POA: Diagnosis not present

## 2019-05-29 DIAGNOSIS — N390 Urinary tract infection, site not specified: Secondary | ICD-10-CM | POA: Diagnosis not present

## 2019-05-30 DIAGNOSIS — N390 Urinary tract infection, site not specified: Secondary | ICD-10-CM | POA: Diagnosis not present

## 2019-05-30 DIAGNOSIS — J9611 Chronic respiratory failure with hypoxia: Secondary | ICD-10-CM | POA: Diagnosis not present

## 2019-05-31 DIAGNOSIS — J9611 Chronic respiratory failure with hypoxia: Secondary | ICD-10-CM | POA: Diagnosis not present

## 2019-05-31 DIAGNOSIS — L89152 Pressure ulcer of sacral region, stage 2: Secondary | ICD-10-CM | POA: Diagnosis not present

## 2019-05-31 DIAGNOSIS — Z9981 Dependence on supplemental oxygen: Secondary | ICD-10-CM | POA: Diagnosis not present

## 2019-05-31 DIAGNOSIS — R197 Diarrhea, unspecified: Secondary | ICD-10-CM | POA: Diagnosis not present

## 2019-05-31 DIAGNOSIS — N390 Urinary tract infection, site not specified: Secondary | ICD-10-CM | POA: Diagnosis not present

## 2019-05-31 DIAGNOSIS — E46 Unspecified protein-calorie malnutrition: Secondary | ICD-10-CM | POA: Diagnosis not present

## 2019-06-05 DIAGNOSIS — I5032 Chronic diastolic (congestive) heart failure: Secondary | ICD-10-CM | POA: Diagnosis not present

## 2019-06-05 DIAGNOSIS — I110 Hypertensive heart disease with heart failure: Secondary | ICD-10-CM | POA: Diagnosis not present

## 2019-06-05 DIAGNOSIS — I5020 Unspecified systolic (congestive) heart failure: Secondary | ICD-10-CM | POA: Diagnosis not present

## 2019-06-05 DIAGNOSIS — E042 Nontoxic multinodular goiter: Secondary | ICD-10-CM | POA: Diagnosis not present

## 2019-06-05 DIAGNOSIS — J449 Chronic obstructive pulmonary disease, unspecified: Secondary | ICD-10-CM | POA: Diagnosis not present

## 2019-06-05 DIAGNOSIS — F0280 Dementia in other diseases classified elsewhere without behavioral disturbance: Secondary | ICD-10-CM | POA: Diagnosis not present

## 2019-06-05 DIAGNOSIS — I482 Chronic atrial fibrillation, unspecified: Secondary | ICD-10-CM | POA: Diagnosis not present

## 2019-06-05 DIAGNOSIS — L89312 Pressure ulcer of right buttock, stage 2: Secondary | ICD-10-CM | POA: Diagnosis not present

## 2019-06-05 DIAGNOSIS — E039 Hypothyroidism, unspecified: Secondary | ICD-10-CM | POA: Diagnosis not present

## 2019-06-05 DIAGNOSIS — D631 Anemia in chronic kidney disease: Secondary | ICD-10-CM | POA: Diagnosis not present

## 2019-06-05 DIAGNOSIS — J9611 Chronic respiratory failure with hypoxia: Secondary | ICD-10-CM | POA: Diagnosis not present

## 2019-06-05 DIAGNOSIS — L89322 Pressure ulcer of left buttock, stage 2: Secondary | ICD-10-CM | POA: Diagnosis not present

## 2019-06-05 DIAGNOSIS — I0981 Rheumatic heart failure: Secondary | ICD-10-CM | POA: Diagnosis not present

## 2019-06-05 DIAGNOSIS — A0811 Acute gastroenteropathy due to Norwalk agent: Secondary | ICD-10-CM | POA: Diagnosis not present

## 2019-06-05 DIAGNOSIS — I2510 Atherosclerotic heart disease of native coronary artery without angina pectoris: Secondary | ICD-10-CM | POA: Diagnosis not present

## 2019-06-05 DIAGNOSIS — E1122 Type 2 diabetes mellitus with diabetic chronic kidney disease: Secondary | ICD-10-CM | POA: Diagnosis not present

## 2019-06-05 DIAGNOSIS — S42292D Other displaced fracture of upper end of left humerus, subsequent encounter for fracture with routine healing: Secondary | ICD-10-CM | POA: Diagnosis not present

## 2019-06-05 DIAGNOSIS — N183 Chronic kidney disease, stage 3 unspecified: Secondary | ICD-10-CM | POA: Diagnosis not present

## 2019-06-05 DIAGNOSIS — F339 Major depressive disorder, recurrent, unspecified: Secondary | ICD-10-CM | POA: Diagnosis not present

## 2019-06-05 DIAGNOSIS — B952 Enterococcus as the cause of diseases classified elsewhere: Secondary | ICD-10-CM | POA: Diagnosis not present

## 2019-06-05 DIAGNOSIS — I081 Rheumatic disorders of both mitral and tricuspid valves: Secondary | ICD-10-CM | POA: Diagnosis not present

## 2019-06-05 DIAGNOSIS — E1151 Type 2 diabetes mellitus with diabetic peripheral angiopathy without gangrene: Secondary | ICD-10-CM | POA: Diagnosis not present

## 2019-06-05 DIAGNOSIS — I700 Atherosclerosis of aorta: Secondary | ICD-10-CM | POA: Diagnosis not present

## 2019-06-05 DIAGNOSIS — N390 Urinary tract infection, site not specified: Secondary | ICD-10-CM | POA: Diagnosis not present

## 2019-06-06 DIAGNOSIS — A0811 Acute gastroenteropathy due to Norwalk agent: Secondary | ICD-10-CM | POA: Diagnosis not present

## 2019-06-06 DIAGNOSIS — L89322 Pressure ulcer of left buttock, stage 2: Secondary | ICD-10-CM | POA: Diagnosis not present

## 2019-06-06 DIAGNOSIS — L89312 Pressure ulcer of right buttock, stage 2: Secondary | ICD-10-CM | POA: Diagnosis not present

## 2019-06-06 DIAGNOSIS — N390 Urinary tract infection, site not specified: Secondary | ICD-10-CM | POA: Diagnosis not present

## 2019-06-06 DIAGNOSIS — S42292D Other displaced fracture of upper end of left humerus, subsequent encounter for fracture with routine healing: Secondary | ICD-10-CM | POA: Diagnosis not present

## 2019-06-06 DIAGNOSIS — B952 Enterococcus as the cause of diseases classified elsewhere: Secondary | ICD-10-CM | POA: Diagnosis not present

## 2019-06-08 DIAGNOSIS — A0811 Acute gastroenteropathy due to Norwalk agent: Secondary | ICD-10-CM | POA: Diagnosis not present

## 2019-06-08 DIAGNOSIS — S42292D Other displaced fracture of upper end of left humerus, subsequent encounter for fracture with routine healing: Secondary | ICD-10-CM | POA: Diagnosis not present

## 2019-06-08 DIAGNOSIS — L89312 Pressure ulcer of right buttock, stage 2: Secondary | ICD-10-CM | POA: Diagnosis not present

## 2019-06-08 DIAGNOSIS — N390 Urinary tract infection, site not specified: Secondary | ICD-10-CM | POA: Diagnosis not present

## 2019-06-08 DIAGNOSIS — L89322 Pressure ulcer of left buttock, stage 2: Secondary | ICD-10-CM | POA: Diagnosis not present

## 2019-06-08 DIAGNOSIS — B952 Enterococcus as the cause of diseases classified elsewhere: Secondary | ICD-10-CM | POA: Diagnosis not present

## 2019-06-12 DIAGNOSIS — A0811 Acute gastroenteropathy due to Norwalk agent: Secondary | ICD-10-CM | POA: Diagnosis not present

## 2019-06-12 DIAGNOSIS — B952 Enterococcus as the cause of diseases classified elsewhere: Secondary | ICD-10-CM | POA: Diagnosis not present

## 2019-06-12 DIAGNOSIS — L89322 Pressure ulcer of left buttock, stage 2: Secondary | ICD-10-CM | POA: Diagnosis not present

## 2019-06-12 DIAGNOSIS — L89312 Pressure ulcer of right buttock, stage 2: Secondary | ICD-10-CM | POA: Diagnosis not present

## 2019-06-12 DIAGNOSIS — S42292D Other displaced fracture of upper end of left humerus, subsequent encounter for fracture with routine healing: Secondary | ICD-10-CM | POA: Diagnosis not present

## 2019-06-12 DIAGNOSIS — N390 Urinary tract infection, site not specified: Secondary | ICD-10-CM | POA: Diagnosis not present

## 2019-06-13 DIAGNOSIS — M79602 Pain in left arm: Secondary | ICD-10-CM | POA: Diagnosis not present

## 2019-06-13 DIAGNOSIS — L89322 Pressure ulcer of left buttock, stage 2: Secondary | ICD-10-CM | POA: Diagnosis not present

## 2019-06-13 DIAGNOSIS — B952 Enterococcus as the cause of diseases classified elsewhere: Secondary | ICD-10-CM | POA: Diagnosis not present

## 2019-06-13 DIAGNOSIS — N390 Urinary tract infection, site not specified: Secondary | ICD-10-CM | POA: Diagnosis not present

## 2019-06-13 DIAGNOSIS — S42292D Other displaced fracture of upper end of left humerus, subsequent encounter for fracture with routine healing: Secondary | ICD-10-CM | POA: Diagnosis not present

## 2019-06-13 DIAGNOSIS — L89312 Pressure ulcer of right buttock, stage 2: Secondary | ICD-10-CM | POA: Diagnosis not present

## 2019-06-13 DIAGNOSIS — A0811 Acute gastroenteropathy due to Norwalk agent: Secondary | ICD-10-CM | POA: Diagnosis not present

## 2019-06-14 DIAGNOSIS — S42292D Other displaced fracture of upper end of left humerus, subsequent encounter for fracture with routine healing: Secondary | ICD-10-CM | POA: Diagnosis not present

## 2019-06-14 DIAGNOSIS — A0811 Acute gastroenteropathy due to Norwalk agent: Secondary | ICD-10-CM | POA: Diagnosis not present

## 2019-06-14 DIAGNOSIS — L89322 Pressure ulcer of left buttock, stage 2: Secondary | ICD-10-CM | POA: Diagnosis not present

## 2019-06-14 DIAGNOSIS — L89312 Pressure ulcer of right buttock, stage 2: Secondary | ICD-10-CM | POA: Diagnosis not present

## 2019-06-14 DIAGNOSIS — N390 Urinary tract infection, site not specified: Secondary | ICD-10-CM | POA: Diagnosis not present

## 2019-06-14 DIAGNOSIS — B952 Enterococcus as the cause of diseases classified elsewhere: Secondary | ICD-10-CM | POA: Diagnosis not present

## 2019-06-15 DIAGNOSIS — A0811 Acute gastroenteropathy due to Norwalk agent: Secondary | ICD-10-CM | POA: Diagnosis not present

## 2019-06-15 DIAGNOSIS — S42292D Other displaced fracture of upper end of left humerus, subsequent encounter for fracture with routine healing: Secondary | ICD-10-CM | POA: Diagnosis not present

## 2019-06-15 DIAGNOSIS — L89322 Pressure ulcer of left buttock, stage 2: Secondary | ICD-10-CM | POA: Diagnosis not present

## 2019-06-15 DIAGNOSIS — L89312 Pressure ulcer of right buttock, stage 2: Secondary | ICD-10-CM | POA: Diagnosis not present

## 2019-06-15 DIAGNOSIS — B952 Enterococcus as the cause of diseases classified elsewhere: Secondary | ICD-10-CM | POA: Diagnosis not present

## 2019-06-15 DIAGNOSIS — N390 Urinary tract infection, site not specified: Secondary | ICD-10-CM | POA: Diagnosis not present

## 2019-06-16 DIAGNOSIS — A0811 Acute gastroenteropathy due to Norwalk agent: Secondary | ICD-10-CM | POA: Diagnosis not present

## 2019-06-16 DIAGNOSIS — N390 Urinary tract infection, site not specified: Secondary | ICD-10-CM | POA: Diagnosis not present

## 2019-06-16 DIAGNOSIS — L89322 Pressure ulcer of left buttock, stage 2: Secondary | ICD-10-CM | POA: Diagnosis not present

## 2019-06-16 DIAGNOSIS — B952 Enterococcus as the cause of diseases classified elsewhere: Secondary | ICD-10-CM | POA: Diagnosis not present

## 2019-06-16 DIAGNOSIS — L89312 Pressure ulcer of right buttock, stage 2: Secondary | ICD-10-CM | POA: Diagnosis not present

## 2019-06-16 DIAGNOSIS — S42292D Other displaced fracture of upper end of left humerus, subsequent encounter for fracture with routine healing: Secondary | ICD-10-CM | POA: Diagnosis not present

## 2019-06-19 DIAGNOSIS — N390 Urinary tract infection, site not specified: Secondary | ICD-10-CM | POA: Diagnosis not present

## 2019-06-19 DIAGNOSIS — B952 Enterococcus as the cause of diseases classified elsewhere: Secondary | ICD-10-CM | POA: Diagnosis not present

## 2019-06-19 DIAGNOSIS — S42292D Other displaced fracture of upper end of left humerus, subsequent encounter for fracture with routine healing: Secondary | ICD-10-CM | POA: Diagnosis not present

## 2019-06-19 DIAGNOSIS — A0811 Acute gastroenteropathy due to Norwalk agent: Secondary | ICD-10-CM | POA: Diagnosis not present

## 2019-06-19 DIAGNOSIS — L89312 Pressure ulcer of right buttock, stage 2: Secondary | ICD-10-CM | POA: Diagnosis not present

## 2019-06-19 DIAGNOSIS — L89322 Pressure ulcer of left buttock, stage 2: Secondary | ICD-10-CM | POA: Diagnosis not present

## 2019-06-20 DIAGNOSIS — A0811 Acute gastroenteropathy due to Norwalk agent: Secondary | ICD-10-CM | POA: Diagnosis not present

## 2019-06-20 DIAGNOSIS — L89312 Pressure ulcer of right buttock, stage 2: Secondary | ICD-10-CM | POA: Diagnosis not present

## 2019-06-20 DIAGNOSIS — S42292D Other displaced fracture of upper end of left humerus, subsequent encounter for fracture with routine healing: Secondary | ICD-10-CM | POA: Diagnosis not present

## 2019-06-20 DIAGNOSIS — L89322 Pressure ulcer of left buttock, stage 2: Secondary | ICD-10-CM | POA: Diagnosis not present

## 2019-06-20 DIAGNOSIS — N390 Urinary tract infection, site not specified: Secondary | ICD-10-CM | POA: Diagnosis not present

## 2019-06-20 DIAGNOSIS — B952 Enterococcus as the cause of diseases classified elsewhere: Secondary | ICD-10-CM | POA: Diagnosis not present

## 2019-06-21 DIAGNOSIS — S42292D Other displaced fracture of upper end of left humerus, subsequent encounter for fracture with routine healing: Secondary | ICD-10-CM | POA: Diagnosis not present

## 2019-06-21 DIAGNOSIS — N390 Urinary tract infection, site not specified: Secondary | ICD-10-CM | POA: Diagnosis not present

## 2019-06-21 DIAGNOSIS — L89312 Pressure ulcer of right buttock, stage 2: Secondary | ICD-10-CM | POA: Diagnosis not present

## 2019-06-21 DIAGNOSIS — B952 Enterococcus as the cause of diseases classified elsewhere: Secondary | ICD-10-CM | POA: Diagnosis not present

## 2019-06-21 DIAGNOSIS — L89322 Pressure ulcer of left buttock, stage 2: Secondary | ICD-10-CM | POA: Diagnosis not present

## 2019-06-21 DIAGNOSIS — A0811 Acute gastroenteropathy due to Norwalk agent: Secondary | ICD-10-CM | POA: Diagnosis not present

## 2019-06-26 ENCOUNTER — Other Ambulatory Visit (HOSPITAL_COMMUNITY)
Admission: AD | Admit: 2019-06-26 | Discharge: 2019-06-26 | Disposition: A | Discharge status: Home / Self Care | Payer: Medicare Other | Source: Other Acute Inpatient Hospital

## 2019-06-26 DIAGNOSIS — N390 Urinary tract infection, site not specified: Secondary | ICD-10-CM | POA: Diagnosis not present

## 2019-06-26 DIAGNOSIS — B952 Enterococcus as the cause of diseases classified elsewhere: Secondary | ICD-10-CM | POA: Diagnosis not present

## 2019-06-26 DIAGNOSIS — L89322 Pressure ulcer of left buttock, stage 2: Secondary | ICD-10-CM | POA: Diagnosis not present

## 2019-06-26 DIAGNOSIS — L89312 Pressure ulcer of right buttock, stage 2: Secondary | ICD-10-CM | POA: Diagnosis not present

## 2019-06-26 DIAGNOSIS — S42292D Other displaced fracture of upper end of left humerus, subsequent encounter for fracture with routine healing: Secondary | ICD-10-CM | POA: Diagnosis not present

## 2019-06-26 DIAGNOSIS — Z029 Encounter for administrative examinations, unspecified: Secondary | ICD-10-CM | POA: Diagnosis present

## 2019-06-26 DIAGNOSIS — A0811 Acute gastroenteropathy due to Norwalk agent: Secondary | ICD-10-CM | POA: Diagnosis not present

## 2019-06-26 LAB — URINALYSIS, COMPLETE (UACMP) WITH MICROSCOPIC
Bilirubin Urine: NEGATIVE
Glucose, UA: >=500 mg/dL — AB
Ketones, ur: NEGATIVE mg/dL
Nitrite: NEGATIVE
Protein, ur: 100 mg/dL — AB
Specific Gravity, Urine: 1.01 (ref 1.005–1.030)
WBC, UA: >50 WBC/hpf — ABNORMAL HIGH (ref 0–5)
pH: 6 (ref 5.0–8.0)

## 2019-06-27 DIAGNOSIS — L89312 Pressure ulcer of right buttock, stage 2: Secondary | ICD-10-CM | POA: Diagnosis not present

## 2019-06-27 DIAGNOSIS — A0811 Acute gastroenteropathy due to Norwalk agent: Secondary | ICD-10-CM | POA: Diagnosis not present

## 2019-06-27 DIAGNOSIS — N390 Urinary tract infection, site not specified: Secondary | ICD-10-CM | POA: Diagnosis not present

## 2019-06-27 DIAGNOSIS — S42292D Other displaced fracture of upper end of left humerus, subsequent encounter for fracture with routine healing: Secondary | ICD-10-CM | POA: Diagnosis not present

## 2019-06-27 DIAGNOSIS — L89322 Pressure ulcer of left buttock, stage 2: Secondary | ICD-10-CM | POA: Diagnosis not present

## 2019-06-27 DIAGNOSIS — B952 Enterococcus as the cause of diseases classified elsewhere: Secondary | ICD-10-CM | POA: Diagnosis not present

## 2019-06-29 LAB — URINE CULTURE: Culture: >=100000 — AB

## 2019-06-30 DIAGNOSIS — L89322 Pressure ulcer of left buttock, stage 2: Secondary | ICD-10-CM | POA: Diagnosis not present

## 2019-06-30 DIAGNOSIS — A0811 Acute gastroenteropathy due to Norwalk agent: Secondary | ICD-10-CM | POA: Diagnosis not present

## 2019-06-30 DIAGNOSIS — N390 Urinary tract infection, site not specified: Secondary | ICD-10-CM | POA: Diagnosis not present

## 2019-06-30 DIAGNOSIS — L89312 Pressure ulcer of right buttock, stage 2: Secondary | ICD-10-CM | POA: Diagnosis not present

## 2019-06-30 DIAGNOSIS — B952 Enterococcus as the cause of diseases classified elsewhere: Secondary | ICD-10-CM | POA: Diagnosis not present

## 2019-06-30 DIAGNOSIS — S42292D Other displaced fracture of upper end of left humerus, subsequent encounter for fracture with routine healing: Secondary | ICD-10-CM | POA: Diagnosis not present

## 2019-07-03 DIAGNOSIS — L89322 Pressure ulcer of left buttock, stage 2: Secondary | ICD-10-CM | POA: Diagnosis not present

## 2019-07-03 DIAGNOSIS — S42292D Other displaced fracture of upper end of left humerus, subsequent encounter for fracture with routine healing: Secondary | ICD-10-CM | POA: Diagnosis not present

## 2019-07-03 DIAGNOSIS — N390 Urinary tract infection, site not specified: Secondary | ICD-10-CM | POA: Diagnosis not present

## 2019-07-03 DIAGNOSIS — A0811 Acute gastroenteropathy due to Norwalk agent: Secondary | ICD-10-CM | POA: Diagnosis not present

## 2019-07-03 DIAGNOSIS — B952 Enterococcus as the cause of diseases classified elsewhere: Secondary | ICD-10-CM | POA: Diagnosis not present

## 2019-07-03 DIAGNOSIS — L89312 Pressure ulcer of right buttock, stage 2: Secondary | ICD-10-CM | POA: Diagnosis not present

## 2019-07-04 DIAGNOSIS — L89312 Pressure ulcer of right buttock, stage 2: Secondary | ICD-10-CM | POA: Diagnosis not present

## 2019-07-04 DIAGNOSIS — S42292D Other displaced fracture of upper end of left humerus, subsequent encounter for fracture with routine healing: Secondary | ICD-10-CM | POA: Diagnosis not present

## 2019-07-04 DIAGNOSIS — L89322 Pressure ulcer of left buttock, stage 2: Secondary | ICD-10-CM | POA: Diagnosis not present

## 2019-07-04 DIAGNOSIS — N390 Urinary tract infection, site not specified: Secondary | ICD-10-CM | POA: Diagnosis not present

## 2019-07-04 DIAGNOSIS — B952 Enterococcus as the cause of diseases classified elsewhere: Secondary | ICD-10-CM | POA: Diagnosis not present

## 2019-07-04 DIAGNOSIS — A0811 Acute gastroenteropathy due to Norwalk agent: Secondary | ICD-10-CM | POA: Diagnosis not present

## 2019-07-05 DIAGNOSIS — D631 Anemia in chronic kidney disease: Secondary | ICD-10-CM | POA: Diagnosis not present

## 2019-07-05 DIAGNOSIS — L89312 Pressure ulcer of right buttock, stage 2: Secondary | ICD-10-CM | POA: Diagnosis not present

## 2019-07-05 DIAGNOSIS — B952 Enterococcus as the cause of diseases classified elsewhere: Secondary | ICD-10-CM | POA: Diagnosis not present

## 2019-07-05 DIAGNOSIS — N183 Chronic kidney disease, stage 3 unspecified: Secondary | ICD-10-CM | POA: Diagnosis not present

## 2019-07-05 DIAGNOSIS — I5032 Chronic diastolic (congestive) heart failure: Secondary | ICD-10-CM | POA: Diagnosis not present

## 2019-07-05 DIAGNOSIS — S42292D Other displaced fracture of upper end of left humerus, subsequent encounter for fracture with routine healing: Secondary | ICD-10-CM | POA: Diagnosis not present

## 2019-07-05 DIAGNOSIS — L89322 Pressure ulcer of left buttock, stage 2: Secondary | ICD-10-CM | POA: Diagnosis not present

## 2019-07-05 DIAGNOSIS — I700 Atherosclerosis of aorta: Secondary | ICD-10-CM | POA: Diagnosis not present

## 2019-07-05 DIAGNOSIS — E039 Hypothyroidism, unspecified: Secondary | ICD-10-CM | POA: Diagnosis not present

## 2019-07-05 DIAGNOSIS — J449 Chronic obstructive pulmonary disease, unspecified: Secondary | ICD-10-CM | POA: Diagnosis not present

## 2019-07-05 DIAGNOSIS — I5020 Unspecified systolic (congestive) heart failure: Secondary | ICD-10-CM | POA: Diagnosis not present

## 2019-07-05 DIAGNOSIS — I081 Rheumatic disorders of both mitral and tricuspid valves: Secondary | ICD-10-CM | POA: Diagnosis not present

## 2019-07-05 DIAGNOSIS — I110 Hypertensive heart disease with heart failure: Secondary | ICD-10-CM | POA: Diagnosis not present

## 2019-07-05 DIAGNOSIS — N390 Urinary tract infection, site not specified: Secondary | ICD-10-CM | POA: Diagnosis not present

## 2019-07-05 DIAGNOSIS — E042 Nontoxic multinodular goiter: Secondary | ICD-10-CM | POA: Diagnosis not present

## 2019-07-05 DIAGNOSIS — E1122 Type 2 diabetes mellitus with diabetic chronic kidney disease: Secondary | ICD-10-CM | POA: Diagnosis not present

## 2019-07-05 DIAGNOSIS — E1151 Type 2 diabetes mellitus with diabetic peripheral angiopathy without gangrene: Secondary | ICD-10-CM | POA: Diagnosis not present

## 2019-07-05 DIAGNOSIS — J9611 Chronic respiratory failure with hypoxia: Secondary | ICD-10-CM | POA: Diagnosis not present

## 2019-07-05 DIAGNOSIS — I0981 Rheumatic heart failure: Secondary | ICD-10-CM | POA: Diagnosis not present

## 2019-07-05 DIAGNOSIS — I2510 Atherosclerotic heart disease of native coronary artery without angina pectoris: Secondary | ICD-10-CM | POA: Diagnosis not present

## 2019-07-05 DIAGNOSIS — I482 Chronic atrial fibrillation, unspecified: Secondary | ICD-10-CM | POA: Diagnosis not present

## 2019-07-05 DIAGNOSIS — A0811 Acute gastroenteropathy due to Norwalk agent: Secondary | ICD-10-CM | POA: Diagnosis not present

## 2019-07-05 DIAGNOSIS — F339 Major depressive disorder, recurrent, unspecified: Secondary | ICD-10-CM | POA: Diagnosis not present

## 2019-07-05 DIAGNOSIS — F0280 Dementia in other diseases classified elsewhere without behavioral disturbance: Secondary | ICD-10-CM | POA: Diagnosis not present

## 2019-07-06 DIAGNOSIS — L89312 Pressure ulcer of right buttock, stage 2: Secondary | ICD-10-CM | POA: Diagnosis not present

## 2019-07-06 DIAGNOSIS — S42292D Other displaced fracture of upper end of left humerus, subsequent encounter for fracture with routine healing: Secondary | ICD-10-CM | POA: Diagnosis not present

## 2019-07-06 DIAGNOSIS — B952 Enterococcus as the cause of diseases classified elsewhere: Secondary | ICD-10-CM | POA: Diagnosis not present

## 2019-07-06 DIAGNOSIS — N390 Urinary tract infection, site not specified: Secondary | ICD-10-CM | POA: Diagnosis not present

## 2019-07-06 DIAGNOSIS — A0811 Acute gastroenteropathy due to Norwalk agent: Secondary | ICD-10-CM | POA: Diagnosis not present

## 2019-07-06 DIAGNOSIS — L89322 Pressure ulcer of left buttock, stage 2: Secondary | ICD-10-CM | POA: Diagnosis not present

## 2019-07-07 DIAGNOSIS — B952 Enterococcus as the cause of diseases classified elsewhere: Secondary | ICD-10-CM | POA: Diagnosis not present

## 2019-07-07 DIAGNOSIS — A0811 Acute gastroenteropathy due to Norwalk agent: Secondary | ICD-10-CM | POA: Diagnosis not present

## 2019-07-07 DIAGNOSIS — N390 Urinary tract infection, site not specified: Secondary | ICD-10-CM | POA: Diagnosis not present

## 2019-07-07 DIAGNOSIS — L89312 Pressure ulcer of right buttock, stage 2: Secondary | ICD-10-CM | POA: Diagnosis not present

## 2019-07-07 DIAGNOSIS — L89322 Pressure ulcer of left buttock, stage 2: Secondary | ICD-10-CM | POA: Diagnosis not present

## 2019-07-07 DIAGNOSIS — S42292D Other displaced fracture of upper end of left humerus, subsequent encounter for fracture with routine healing: Secondary | ICD-10-CM | POA: Diagnosis not present

## 2019-07-10 DIAGNOSIS — L89312 Pressure ulcer of right buttock, stage 2: Secondary | ICD-10-CM | POA: Diagnosis not present

## 2019-07-10 DIAGNOSIS — L89322 Pressure ulcer of left buttock, stage 2: Secondary | ICD-10-CM | POA: Diagnosis not present

## 2019-07-10 DIAGNOSIS — A0811 Acute gastroenteropathy due to Norwalk agent: Secondary | ICD-10-CM | POA: Diagnosis not present

## 2019-07-10 DIAGNOSIS — N390 Urinary tract infection, site not specified: Secondary | ICD-10-CM | POA: Diagnosis not present

## 2019-07-10 DIAGNOSIS — S42292D Other displaced fracture of upper end of left humerus, subsequent encounter for fracture with routine healing: Secondary | ICD-10-CM | POA: Diagnosis not present

## 2019-07-10 DIAGNOSIS — B952 Enterococcus as the cause of diseases classified elsewhere: Secondary | ICD-10-CM | POA: Diagnosis not present

## 2019-07-11 DIAGNOSIS — S42292D Other displaced fracture of upper end of left humerus, subsequent encounter for fracture with routine healing: Secondary | ICD-10-CM | POA: Diagnosis not present

## 2019-07-11 DIAGNOSIS — L89312 Pressure ulcer of right buttock, stage 2: Secondary | ICD-10-CM | POA: Diagnosis not present

## 2019-07-11 DIAGNOSIS — B952 Enterococcus as the cause of diseases classified elsewhere: Secondary | ICD-10-CM | POA: Diagnosis not present

## 2019-07-11 DIAGNOSIS — L89322 Pressure ulcer of left buttock, stage 2: Secondary | ICD-10-CM | POA: Diagnosis not present

## 2019-07-11 DIAGNOSIS — N390 Urinary tract infection, site not specified: Secondary | ICD-10-CM | POA: Diagnosis not present

## 2019-07-11 DIAGNOSIS — A0811 Acute gastroenteropathy due to Norwalk agent: Secondary | ICD-10-CM | POA: Diagnosis not present

## 2019-07-13 DIAGNOSIS — B952 Enterococcus as the cause of diseases classified elsewhere: Secondary | ICD-10-CM | POA: Diagnosis not present

## 2019-07-13 DIAGNOSIS — S42292D Other displaced fracture of upper end of left humerus, subsequent encounter for fracture with routine healing: Secondary | ICD-10-CM | POA: Diagnosis not present

## 2019-07-13 DIAGNOSIS — N390 Urinary tract infection, site not specified: Secondary | ICD-10-CM | POA: Diagnosis not present

## 2019-07-13 DIAGNOSIS — L89322 Pressure ulcer of left buttock, stage 2: Secondary | ICD-10-CM | POA: Diagnosis not present

## 2019-07-13 DIAGNOSIS — A0811 Acute gastroenteropathy due to Norwalk agent: Secondary | ICD-10-CM | POA: Diagnosis not present

## 2019-07-13 DIAGNOSIS — L89312 Pressure ulcer of right buttock, stage 2: Secondary | ICD-10-CM | POA: Diagnosis not present

## 2019-07-14 DIAGNOSIS — B952 Enterococcus as the cause of diseases classified elsewhere: Secondary | ICD-10-CM | POA: Diagnosis not present

## 2019-07-14 DIAGNOSIS — N390 Urinary tract infection, site not specified: Secondary | ICD-10-CM | POA: Diagnosis not present

## 2019-07-14 DIAGNOSIS — S42292D Other displaced fracture of upper end of left humerus, subsequent encounter for fracture with routine healing: Secondary | ICD-10-CM | POA: Diagnosis not present

## 2019-07-14 DIAGNOSIS — L89322 Pressure ulcer of left buttock, stage 2: Secondary | ICD-10-CM | POA: Diagnosis not present

## 2019-07-14 DIAGNOSIS — L89312 Pressure ulcer of right buttock, stage 2: Secondary | ICD-10-CM | POA: Diagnosis not present

## 2019-07-14 DIAGNOSIS — A0811 Acute gastroenteropathy due to Norwalk agent: Secondary | ICD-10-CM | POA: Diagnosis not present

## 2019-07-17 DIAGNOSIS — B952 Enterococcus as the cause of diseases classified elsewhere: Secondary | ICD-10-CM | POA: Diagnosis not present

## 2019-07-17 DIAGNOSIS — L89322 Pressure ulcer of left buttock, stage 2: Secondary | ICD-10-CM | POA: Diagnosis not present

## 2019-07-17 DIAGNOSIS — L89312 Pressure ulcer of right buttock, stage 2: Secondary | ICD-10-CM | POA: Diagnosis not present

## 2019-07-17 DIAGNOSIS — A0811 Acute gastroenteropathy due to Norwalk agent: Secondary | ICD-10-CM | POA: Diagnosis not present

## 2019-07-17 DIAGNOSIS — N390 Urinary tract infection, site not specified: Secondary | ICD-10-CM | POA: Diagnosis not present

## 2019-07-17 DIAGNOSIS — S42292D Other displaced fracture of upper end of left humerus, subsequent encounter for fracture with routine healing: Secondary | ICD-10-CM | POA: Diagnosis not present

## 2019-07-18 DIAGNOSIS — B952 Enterococcus as the cause of diseases classified elsewhere: Secondary | ICD-10-CM | POA: Diagnosis not present

## 2019-07-18 DIAGNOSIS — L89322 Pressure ulcer of left buttock, stage 2: Secondary | ICD-10-CM | POA: Diagnosis not present

## 2019-07-18 DIAGNOSIS — L89312 Pressure ulcer of right buttock, stage 2: Secondary | ICD-10-CM | POA: Diagnosis not present

## 2019-07-18 DIAGNOSIS — N390 Urinary tract infection, site not specified: Secondary | ICD-10-CM | POA: Diagnosis not present

## 2019-07-18 DIAGNOSIS — S42292D Other displaced fracture of upper end of left humerus, subsequent encounter for fracture with routine healing: Secondary | ICD-10-CM | POA: Diagnosis not present

## 2019-07-18 DIAGNOSIS — A0811 Acute gastroenteropathy due to Norwalk agent: Secondary | ICD-10-CM | POA: Diagnosis not present

## 2019-07-19 DIAGNOSIS — N390 Urinary tract infection, site not specified: Secondary | ICD-10-CM | POA: Diagnosis not present

## 2019-07-19 DIAGNOSIS — A0811 Acute gastroenteropathy due to Norwalk agent: Secondary | ICD-10-CM | POA: Diagnosis not present

## 2019-07-19 DIAGNOSIS — L89322 Pressure ulcer of left buttock, stage 2: Secondary | ICD-10-CM | POA: Diagnosis not present

## 2019-07-19 DIAGNOSIS — S42292D Other displaced fracture of upper end of left humerus, subsequent encounter for fracture with routine healing: Secondary | ICD-10-CM | POA: Diagnosis not present

## 2019-07-19 DIAGNOSIS — L89312 Pressure ulcer of right buttock, stage 2: Secondary | ICD-10-CM | POA: Diagnosis not present

## 2019-07-19 DIAGNOSIS — B952 Enterococcus as the cause of diseases classified elsewhere: Secondary | ICD-10-CM | POA: Diagnosis not present

## 2019-07-20 DIAGNOSIS — N390 Urinary tract infection, site not specified: Secondary | ICD-10-CM | POA: Diagnosis not present

## 2019-07-20 DIAGNOSIS — L89322 Pressure ulcer of left buttock, stage 2: Secondary | ICD-10-CM | POA: Diagnosis not present

## 2019-07-20 DIAGNOSIS — B952 Enterococcus as the cause of diseases classified elsewhere: Secondary | ICD-10-CM | POA: Diagnosis not present

## 2019-07-20 DIAGNOSIS — S42292D Other displaced fracture of upper end of left humerus, subsequent encounter for fracture with routine healing: Secondary | ICD-10-CM | POA: Diagnosis not present

## 2019-07-20 DIAGNOSIS — L89312 Pressure ulcer of right buttock, stage 2: Secondary | ICD-10-CM | POA: Diagnosis not present

## 2019-07-20 DIAGNOSIS — A0811 Acute gastroenteropathy due to Norwalk agent: Secondary | ICD-10-CM | POA: Diagnosis not present

## 2019-07-23 DIAGNOSIS — L89322 Pressure ulcer of left buttock, stage 2: Secondary | ICD-10-CM | POA: Diagnosis not present

## 2019-07-23 DIAGNOSIS — N390 Urinary tract infection, site not specified: Secondary | ICD-10-CM | POA: Diagnosis not present

## 2019-07-23 DIAGNOSIS — L89312 Pressure ulcer of right buttock, stage 2: Secondary | ICD-10-CM | POA: Diagnosis not present

## 2019-07-23 DIAGNOSIS — S42292D Other displaced fracture of upper end of left humerus, subsequent encounter for fracture with routine healing: Secondary | ICD-10-CM | POA: Diagnosis not present

## 2019-07-23 DIAGNOSIS — B952 Enterococcus as the cause of diseases classified elsewhere: Secondary | ICD-10-CM | POA: Diagnosis not present

## 2019-07-23 DIAGNOSIS — A0811 Acute gastroenteropathy due to Norwalk agent: Secondary | ICD-10-CM | POA: Diagnosis not present

## 2019-07-25 DIAGNOSIS — L89312 Pressure ulcer of right buttock, stage 2: Secondary | ICD-10-CM | POA: Diagnosis not present

## 2019-07-25 DIAGNOSIS — B952 Enterococcus as the cause of diseases classified elsewhere: Secondary | ICD-10-CM | POA: Diagnosis not present

## 2019-07-25 DIAGNOSIS — A0811 Acute gastroenteropathy due to Norwalk agent: Secondary | ICD-10-CM | POA: Diagnosis not present

## 2019-07-25 DIAGNOSIS — N390 Urinary tract infection, site not specified: Secondary | ICD-10-CM | POA: Diagnosis not present

## 2019-07-25 DIAGNOSIS — L89322 Pressure ulcer of left buttock, stage 2: Secondary | ICD-10-CM | POA: Diagnosis not present

## 2019-07-25 DIAGNOSIS — S42292D Other displaced fracture of upper end of left humerus, subsequent encounter for fracture with routine healing: Secondary | ICD-10-CM | POA: Diagnosis not present

## 2019-07-26 DIAGNOSIS — A0811 Acute gastroenteropathy due to Norwalk agent: Secondary | ICD-10-CM | POA: Diagnosis not present

## 2019-07-26 DIAGNOSIS — N390 Urinary tract infection, site not specified: Secondary | ICD-10-CM | POA: Diagnosis not present

## 2019-07-26 DIAGNOSIS — L89322 Pressure ulcer of left buttock, stage 2: Secondary | ICD-10-CM | POA: Diagnosis not present

## 2019-07-26 DIAGNOSIS — S42292D Other displaced fracture of upper end of left humerus, subsequent encounter for fracture with routine healing: Secondary | ICD-10-CM | POA: Diagnosis not present

## 2019-07-26 DIAGNOSIS — B952 Enterococcus as the cause of diseases classified elsewhere: Secondary | ICD-10-CM | POA: Diagnosis not present

## 2019-07-26 DIAGNOSIS — L89312 Pressure ulcer of right buttock, stage 2: Secondary | ICD-10-CM | POA: Diagnosis not present

## 2019-07-27 DIAGNOSIS — L89312 Pressure ulcer of right buttock, stage 2: Secondary | ICD-10-CM | POA: Diagnosis not present

## 2019-07-27 DIAGNOSIS — L89322 Pressure ulcer of left buttock, stage 2: Secondary | ICD-10-CM | POA: Diagnosis not present

## 2019-07-27 DIAGNOSIS — S42292D Other displaced fracture of upper end of left humerus, subsequent encounter for fracture with routine healing: Secondary | ICD-10-CM | POA: Diagnosis not present

## 2019-07-27 DIAGNOSIS — N390 Urinary tract infection, site not specified: Secondary | ICD-10-CM | POA: Diagnosis not present

## 2019-07-27 DIAGNOSIS — A0811 Acute gastroenteropathy due to Norwalk agent: Secondary | ICD-10-CM | POA: Diagnosis not present

## 2019-07-27 DIAGNOSIS — B952 Enterococcus as the cause of diseases classified elsewhere: Secondary | ICD-10-CM | POA: Diagnosis not present

## 2019-07-31 DIAGNOSIS — A0811 Acute gastroenteropathy due to Norwalk agent: Secondary | ICD-10-CM | POA: Diagnosis not present

## 2019-07-31 DIAGNOSIS — L89322 Pressure ulcer of left buttock, stage 2: Secondary | ICD-10-CM | POA: Diagnosis not present

## 2019-07-31 DIAGNOSIS — S42292D Other displaced fracture of upper end of left humerus, subsequent encounter for fracture with routine healing: Secondary | ICD-10-CM | POA: Diagnosis not present

## 2019-07-31 DIAGNOSIS — B952 Enterococcus as the cause of diseases classified elsewhere: Secondary | ICD-10-CM | POA: Diagnosis not present

## 2019-07-31 DIAGNOSIS — L89312 Pressure ulcer of right buttock, stage 2: Secondary | ICD-10-CM | POA: Diagnosis not present

## 2019-07-31 DIAGNOSIS — N390 Urinary tract infection, site not specified: Secondary | ICD-10-CM | POA: Diagnosis not present

## 2019-08-01 DIAGNOSIS — B952 Enterococcus as the cause of diseases classified elsewhere: Secondary | ICD-10-CM | POA: Diagnosis not present

## 2019-08-01 DIAGNOSIS — L89312 Pressure ulcer of right buttock, stage 2: Secondary | ICD-10-CM | POA: Diagnosis not present

## 2019-08-01 DIAGNOSIS — N390 Urinary tract infection, site not specified: Secondary | ICD-10-CM | POA: Diagnosis not present

## 2019-08-01 DIAGNOSIS — A0811 Acute gastroenteropathy due to Norwalk agent: Secondary | ICD-10-CM | POA: Diagnosis not present

## 2019-08-01 DIAGNOSIS — L89322 Pressure ulcer of left buttock, stage 2: Secondary | ICD-10-CM | POA: Diagnosis not present

## 2019-08-01 DIAGNOSIS — S42292D Other displaced fracture of upper end of left humerus, subsequent encounter for fracture with routine healing: Secondary | ICD-10-CM | POA: Diagnosis not present

## 2019-08-02 DIAGNOSIS — S42292D Other displaced fracture of upper end of left humerus, subsequent encounter for fracture with routine healing: Secondary | ICD-10-CM | POA: Diagnosis not present

## 2019-08-02 DIAGNOSIS — B952 Enterococcus as the cause of diseases classified elsewhere: Secondary | ICD-10-CM | POA: Diagnosis not present

## 2019-08-02 DIAGNOSIS — A0811 Acute gastroenteropathy due to Norwalk agent: Secondary | ICD-10-CM | POA: Diagnosis not present

## 2019-08-02 DIAGNOSIS — L89322 Pressure ulcer of left buttock, stage 2: Secondary | ICD-10-CM | POA: Diagnosis not present

## 2019-08-02 DIAGNOSIS — L89312 Pressure ulcer of right buttock, stage 2: Secondary | ICD-10-CM | POA: Diagnosis not present

## 2019-08-02 DIAGNOSIS — N390 Urinary tract infection, site not specified: Secondary | ICD-10-CM | POA: Diagnosis not present

## 2019-08-04 DIAGNOSIS — S42292D Other displaced fracture of upper end of left humerus, subsequent encounter for fracture with routine healing: Secondary | ICD-10-CM | POA: Diagnosis not present

## 2019-08-04 DIAGNOSIS — D631 Anemia in chronic kidney disease: Secondary | ICD-10-CM | POA: Diagnosis not present

## 2019-08-04 DIAGNOSIS — N390 Urinary tract infection, site not specified: Secondary | ICD-10-CM | POA: Diagnosis not present

## 2019-08-04 DIAGNOSIS — I110 Hypertensive heart disease with heart failure: Secondary | ICD-10-CM | POA: Diagnosis not present

## 2019-08-04 DIAGNOSIS — E042 Nontoxic multinodular goiter: Secondary | ICD-10-CM | POA: Diagnosis not present

## 2019-08-04 DIAGNOSIS — B952 Enterococcus as the cause of diseases classified elsewhere: Secondary | ICD-10-CM | POA: Diagnosis not present

## 2019-08-04 DIAGNOSIS — L89322 Pressure ulcer of left buttock, stage 2: Secondary | ICD-10-CM | POA: Diagnosis not present

## 2019-08-04 DIAGNOSIS — I0981 Rheumatic heart failure: Secondary | ICD-10-CM | POA: Diagnosis not present

## 2019-08-04 DIAGNOSIS — E039 Hypothyroidism, unspecified: Secondary | ICD-10-CM | POA: Diagnosis not present

## 2019-08-04 DIAGNOSIS — I5020 Unspecified systolic (congestive) heart failure: Secondary | ICD-10-CM | POA: Diagnosis not present

## 2019-08-04 DIAGNOSIS — I700 Atherosclerosis of aorta: Secondary | ICD-10-CM | POA: Diagnosis not present

## 2019-08-04 DIAGNOSIS — I2510 Atherosclerotic heart disease of native coronary artery without angina pectoris: Secondary | ICD-10-CM | POA: Diagnosis not present

## 2019-08-04 DIAGNOSIS — N183 Chronic kidney disease, stage 3 unspecified: Secondary | ICD-10-CM | POA: Diagnosis not present

## 2019-08-04 DIAGNOSIS — I482 Chronic atrial fibrillation, unspecified: Secondary | ICD-10-CM | POA: Diagnosis not present

## 2019-08-04 DIAGNOSIS — F339 Major depressive disorder, recurrent, unspecified: Secondary | ICD-10-CM | POA: Diagnosis not present

## 2019-08-04 DIAGNOSIS — I5032 Chronic diastolic (congestive) heart failure: Secondary | ICD-10-CM | POA: Diagnosis not present

## 2019-08-04 DIAGNOSIS — E1122 Type 2 diabetes mellitus with diabetic chronic kidney disease: Secondary | ICD-10-CM | POA: Diagnosis not present

## 2019-08-04 DIAGNOSIS — L89312 Pressure ulcer of right buttock, stage 2: Secondary | ICD-10-CM | POA: Diagnosis not present

## 2019-08-04 DIAGNOSIS — E1151 Type 2 diabetes mellitus with diabetic peripheral angiopathy without gangrene: Secondary | ICD-10-CM | POA: Diagnosis not present

## 2019-08-04 DIAGNOSIS — F0280 Dementia in other diseases classified elsewhere without behavioral disturbance: Secondary | ICD-10-CM | POA: Diagnosis not present

## 2019-08-04 DIAGNOSIS — J449 Chronic obstructive pulmonary disease, unspecified: Secondary | ICD-10-CM | POA: Diagnosis not present

## 2019-08-04 DIAGNOSIS — I081 Rheumatic disorders of both mitral and tricuspid valves: Secondary | ICD-10-CM | POA: Diagnosis not present

## 2019-08-04 DIAGNOSIS — L89021 Pressure ulcer of left elbow, stage 1: Secondary | ICD-10-CM | POA: Diagnosis not present

## 2019-08-04 DIAGNOSIS — A0811 Acute gastroenteropathy due to Norwalk agent: Secondary | ICD-10-CM | POA: Diagnosis not present

## 2019-08-04 DIAGNOSIS — J9611 Chronic respiratory failure with hypoxia: Secondary | ICD-10-CM | POA: Diagnosis not present

## 2019-08-07 DIAGNOSIS — I482 Chronic atrial fibrillation, unspecified: Secondary | ICD-10-CM | POA: Diagnosis not present

## 2019-08-07 DIAGNOSIS — S42292D Other displaced fracture of upper end of left humerus, subsequent encounter for fracture with routine healing: Secondary | ICD-10-CM | POA: Diagnosis not present

## 2019-08-07 DIAGNOSIS — L89312 Pressure ulcer of right buttock, stage 2: Secondary | ICD-10-CM | POA: Diagnosis not present

## 2019-08-07 DIAGNOSIS — L89322 Pressure ulcer of left buttock, stage 2: Secondary | ICD-10-CM | POA: Diagnosis not present

## 2019-08-07 DIAGNOSIS — L89021 Pressure ulcer of left elbow, stage 1: Secondary | ICD-10-CM | POA: Diagnosis not present

## 2019-08-07 DIAGNOSIS — J449 Chronic obstructive pulmonary disease, unspecified: Secondary | ICD-10-CM | POA: Diagnosis not present

## 2019-08-08 DIAGNOSIS — L89322 Pressure ulcer of left buttock, stage 2: Secondary | ICD-10-CM | POA: Diagnosis not present

## 2019-08-08 DIAGNOSIS — I482 Chronic atrial fibrillation, unspecified: Secondary | ICD-10-CM | POA: Diagnosis not present

## 2019-08-08 DIAGNOSIS — L89312 Pressure ulcer of right buttock, stage 2: Secondary | ICD-10-CM | POA: Diagnosis not present

## 2019-08-08 DIAGNOSIS — J449 Chronic obstructive pulmonary disease, unspecified: Secondary | ICD-10-CM | POA: Diagnosis not present

## 2019-08-08 DIAGNOSIS — S42292D Other displaced fracture of upper end of left humerus, subsequent encounter for fracture with routine healing: Secondary | ICD-10-CM | POA: Diagnosis not present

## 2019-08-08 DIAGNOSIS — L89021 Pressure ulcer of left elbow, stage 1: Secondary | ICD-10-CM | POA: Diagnosis not present

## 2019-08-10 DIAGNOSIS — S42292D Other displaced fracture of upper end of left humerus, subsequent encounter for fracture with routine healing: Secondary | ICD-10-CM | POA: Diagnosis not present

## 2019-08-10 DIAGNOSIS — L89312 Pressure ulcer of right buttock, stage 2: Secondary | ICD-10-CM | POA: Diagnosis not present

## 2019-08-10 DIAGNOSIS — L89322 Pressure ulcer of left buttock, stage 2: Secondary | ICD-10-CM | POA: Diagnosis not present

## 2019-08-10 DIAGNOSIS — J449 Chronic obstructive pulmonary disease, unspecified: Secondary | ICD-10-CM | POA: Diagnosis not present

## 2019-08-10 DIAGNOSIS — L89021 Pressure ulcer of left elbow, stage 1: Secondary | ICD-10-CM | POA: Diagnosis not present

## 2019-08-10 DIAGNOSIS — I482 Chronic atrial fibrillation, unspecified: Secondary | ICD-10-CM | POA: Diagnosis not present

## 2019-08-14 DIAGNOSIS — L89312 Pressure ulcer of right buttock, stage 2: Secondary | ICD-10-CM | POA: Diagnosis not present

## 2019-08-14 DIAGNOSIS — J449 Chronic obstructive pulmonary disease, unspecified: Secondary | ICD-10-CM | POA: Diagnosis not present

## 2019-08-14 DIAGNOSIS — L89021 Pressure ulcer of left elbow, stage 1: Secondary | ICD-10-CM | POA: Diagnosis not present

## 2019-08-14 DIAGNOSIS — I482 Chronic atrial fibrillation, unspecified: Secondary | ICD-10-CM | POA: Diagnosis not present

## 2019-08-14 DIAGNOSIS — L89322 Pressure ulcer of left buttock, stage 2: Secondary | ICD-10-CM | POA: Diagnosis not present

## 2019-08-14 DIAGNOSIS — S42292D Other displaced fracture of upper end of left humerus, subsequent encounter for fracture with routine healing: Secondary | ICD-10-CM | POA: Diagnosis not present

## 2019-08-15 DIAGNOSIS — S42292D Other displaced fracture of upper end of left humerus, subsequent encounter for fracture with routine healing: Secondary | ICD-10-CM | POA: Diagnosis not present

## 2019-08-15 DIAGNOSIS — L89312 Pressure ulcer of right buttock, stage 2: Secondary | ICD-10-CM | POA: Diagnosis not present

## 2019-08-15 DIAGNOSIS — L89021 Pressure ulcer of left elbow, stage 1: Secondary | ICD-10-CM | POA: Diagnosis not present

## 2019-08-15 DIAGNOSIS — I482 Chronic atrial fibrillation, unspecified: Secondary | ICD-10-CM | POA: Diagnosis not present

## 2019-08-15 DIAGNOSIS — L89322 Pressure ulcer of left buttock, stage 2: Secondary | ICD-10-CM | POA: Diagnosis not present

## 2019-08-15 DIAGNOSIS — J449 Chronic obstructive pulmonary disease, unspecified: Secondary | ICD-10-CM | POA: Diagnosis not present

## 2019-08-16 DIAGNOSIS — L89322 Pressure ulcer of left buttock, stage 2: Secondary | ICD-10-CM | POA: Diagnosis not present

## 2019-08-16 DIAGNOSIS — I482 Chronic atrial fibrillation, unspecified: Secondary | ICD-10-CM | POA: Diagnosis not present

## 2019-08-16 DIAGNOSIS — J449 Chronic obstructive pulmonary disease, unspecified: Secondary | ICD-10-CM | POA: Diagnosis not present

## 2019-08-16 DIAGNOSIS — L89312 Pressure ulcer of right buttock, stage 2: Secondary | ICD-10-CM | POA: Diagnosis not present

## 2019-08-16 DIAGNOSIS — L89021 Pressure ulcer of left elbow, stage 1: Secondary | ICD-10-CM | POA: Diagnosis not present

## 2019-08-16 DIAGNOSIS — S42292D Other displaced fracture of upper end of left humerus, subsequent encounter for fracture with routine healing: Secondary | ICD-10-CM | POA: Diagnosis not present

## 2019-08-17 DIAGNOSIS — I482 Chronic atrial fibrillation, unspecified: Secondary | ICD-10-CM | POA: Diagnosis not present

## 2019-08-17 DIAGNOSIS — L89021 Pressure ulcer of left elbow, stage 1: Secondary | ICD-10-CM | POA: Diagnosis not present

## 2019-08-17 DIAGNOSIS — J449 Chronic obstructive pulmonary disease, unspecified: Secondary | ICD-10-CM | POA: Diagnosis not present

## 2019-08-17 DIAGNOSIS — L89322 Pressure ulcer of left buttock, stage 2: Secondary | ICD-10-CM | POA: Diagnosis not present

## 2019-08-17 DIAGNOSIS — L89312 Pressure ulcer of right buttock, stage 2: Secondary | ICD-10-CM | POA: Diagnosis not present

## 2019-08-17 DIAGNOSIS — S42292D Other displaced fracture of upper end of left humerus, subsequent encounter for fracture with routine healing: Secondary | ICD-10-CM | POA: Diagnosis not present

## 2019-08-21 DIAGNOSIS — I482 Chronic atrial fibrillation, unspecified: Secondary | ICD-10-CM | POA: Diagnosis not present

## 2019-08-21 DIAGNOSIS — S42292D Other displaced fracture of upper end of left humerus, subsequent encounter for fracture with routine healing: Secondary | ICD-10-CM | POA: Diagnosis not present

## 2019-08-21 DIAGNOSIS — L89312 Pressure ulcer of right buttock, stage 2: Secondary | ICD-10-CM | POA: Diagnosis not present

## 2019-08-21 DIAGNOSIS — L89322 Pressure ulcer of left buttock, stage 2: Secondary | ICD-10-CM | POA: Diagnosis not present

## 2019-08-21 DIAGNOSIS — J449 Chronic obstructive pulmonary disease, unspecified: Secondary | ICD-10-CM | POA: Diagnosis not present

## 2019-08-21 DIAGNOSIS — L89021 Pressure ulcer of left elbow, stage 1: Secondary | ICD-10-CM | POA: Diagnosis not present

## 2019-08-22 DIAGNOSIS — J449 Chronic obstructive pulmonary disease, unspecified: Secondary | ICD-10-CM | POA: Diagnosis not present

## 2019-08-22 DIAGNOSIS — L89312 Pressure ulcer of right buttock, stage 2: Secondary | ICD-10-CM | POA: Diagnosis not present

## 2019-08-22 DIAGNOSIS — L89021 Pressure ulcer of left elbow, stage 1: Secondary | ICD-10-CM | POA: Diagnosis not present

## 2019-08-22 DIAGNOSIS — S42292D Other displaced fracture of upper end of left humerus, subsequent encounter for fracture with routine healing: Secondary | ICD-10-CM | POA: Diagnosis not present

## 2019-08-22 DIAGNOSIS — I482 Chronic atrial fibrillation, unspecified: Secondary | ICD-10-CM | POA: Diagnosis not present

## 2019-08-22 DIAGNOSIS — L89322 Pressure ulcer of left buttock, stage 2: Secondary | ICD-10-CM | POA: Diagnosis not present

## 2019-08-23 DIAGNOSIS — J449 Chronic obstructive pulmonary disease, unspecified: Secondary | ICD-10-CM | POA: Diagnosis not present

## 2019-08-23 DIAGNOSIS — S42292D Other displaced fracture of upper end of left humerus, subsequent encounter for fracture with routine healing: Secondary | ICD-10-CM | POA: Diagnosis not present

## 2019-08-23 DIAGNOSIS — L89312 Pressure ulcer of right buttock, stage 2: Secondary | ICD-10-CM | POA: Diagnosis not present

## 2019-08-23 DIAGNOSIS — L89021 Pressure ulcer of left elbow, stage 1: Secondary | ICD-10-CM | POA: Diagnosis not present

## 2019-08-23 DIAGNOSIS — L89322 Pressure ulcer of left buttock, stage 2: Secondary | ICD-10-CM | POA: Diagnosis not present

## 2019-08-23 DIAGNOSIS — I482 Chronic atrial fibrillation, unspecified: Secondary | ICD-10-CM | POA: Diagnosis not present

## 2019-08-25 DIAGNOSIS — I482 Chronic atrial fibrillation, unspecified: Secondary | ICD-10-CM | POA: Diagnosis not present

## 2019-08-25 DIAGNOSIS — L89312 Pressure ulcer of right buttock, stage 2: Secondary | ICD-10-CM | POA: Diagnosis not present

## 2019-08-25 DIAGNOSIS — L89021 Pressure ulcer of left elbow, stage 1: Secondary | ICD-10-CM | POA: Diagnosis not present

## 2019-08-25 DIAGNOSIS — S42292D Other displaced fracture of upper end of left humerus, subsequent encounter for fracture with routine healing: Secondary | ICD-10-CM | POA: Diagnosis not present

## 2019-08-25 DIAGNOSIS — L89322 Pressure ulcer of left buttock, stage 2: Secondary | ICD-10-CM | POA: Diagnosis not present

## 2019-08-25 DIAGNOSIS — J449 Chronic obstructive pulmonary disease, unspecified: Secondary | ICD-10-CM | POA: Diagnosis not present

## 2019-08-28 DIAGNOSIS — J449 Chronic obstructive pulmonary disease, unspecified: Secondary | ICD-10-CM | POA: Diagnosis not present

## 2019-08-28 DIAGNOSIS — L89312 Pressure ulcer of right buttock, stage 2: Secondary | ICD-10-CM | POA: Diagnosis not present

## 2019-08-28 DIAGNOSIS — L89322 Pressure ulcer of left buttock, stage 2: Secondary | ICD-10-CM | POA: Diagnosis not present

## 2019-08-28 DIAGNOSIS — I482 Chronic atrial fibrillation, unspecified: Secondary | ICD-10-CM | POA: Diagnosis not present

## 2019-08-28 DIAGNOSIS — S42292D Other displaced fracture of upper end of left humerus, subsequent encounter for fracture with routine healing: Secondary | ICD-10-CM | POA: Diagnosis not present

## 2019-08-28 DIAGNOSIS — L89021 Pressure ulcer of left elbow, stage 1: Secondary | ICD-10-CM | POA: Diagnosis not present

## 2019-08-29 DIAGNOSIS — L89312 Pressure ulcer of right buttock, stage 2: Secondary | ICD-10-CM | POA: Diagnosis not present

## 2019-08-29 DIAGNOSIS — J449 Chronic obstructive pulmonary disease, unspecified: Secondary | ICD-10-CM | POA: Diagnosis not present

## 2019-08-29 DIAGNOSIS — I482 Chronic atrial fibrillation, unspecified: Secondary | ICD-10-CM | POA: Diagnosis not present

## 2019-08-29 DIAGNOSIS — S42292D Other displaced fracture of upper end of left humerus, subsequent encounter for fracture with routine healing: Secondary | ICD-10-CM | POA: Diagnosis not present

## 2019-08-29 DIAGNOSIS — L89021 Pressure ulcer of left elbow, stage 1: Secondary | ICD-10-CM | POA: Diagnosis not present

## 2019-08-29 DIAGNOSIS — L89322 Pressure ulcer of left buttock, stage 2: Secondary | ICD-10-CM | POA: Diagnosis not present

## 2019-08-30 DIAGNOSIS — L89322 Pressure ulcer of left buttock, stage 2: Secondary | ICD-10-CM | POA: Diagnosis not present

## 2019-08-30 DIAGNOSIS — R399 Unspecified symptoms and signs involving the genitourinary system: Secondary | ICD-10-CM | POA: Diagnosis not present

## 2019-08-30 DIAGNOSIS — I482 Chronic atrial fibrillation, unspecified: Secondary | ICD-10-CM | POA: Diagnosis not present

## 2019-08-30 DIAGNOSIS — L89021 Pressure ulcer of left elbow, stage 1: Secondary | ICD-10-CM | POA: Diagnosis not present

## 2019-08-30 DIAGNOSIS — J449 Chronic obstructive pulmonary disease, unspecified: Secondary | ICD-10-CM | POA: Diagnosis not present

## 2019-08-30 DIAGNOSIS — S42292D Other displaced fracture of upper end of left humerus, subsequent encounter for fracture with routine healing: Secondary | ICD-10-CM | POA: Diagnosis not present

## 2019-08-30 DIAGNOSIS — L89312 Pressure ulcer of right buttock, stage 2: Secondary | ICD-10-CM | POA: Diagnosis not present

## 2019-08-30 DIAGNOSIS — N390 Urinary tract infection, site not specified: Secondary | ICD-10-CM | POA: Diagnosis not present

## 2019-08-31 DIAGNOSIS — I482 Chronic atrial fibrillation, unspecified: Secondary | ICD-10-CM | POA: Diagnosis not present

## 2019-08-31 DIAGNOSIS — L89322 Pressure ulcer of left buttock, stage 2: Secondary | ICD-10-CM | POA: Diagnosis not present

## 2019-08-31 DIAGNOSIS — S42292D Other displaced fracture of upper end of left humerus, subsequent encounter for fracture with routine healing: Secondary | ICD-10-CM | POA: Diagnosis not present

## 2019-08-31 DIAGNOSIS — L89312 Pressure ulcer of right buttock, stage 2: Secondary | ICD-10-CM | POA: Diagnosis not present

## 2019-08-31 DIAGNOSIS — L89021 Pressure ulcer of left elbow, stage 1: Secondary | ICD-10-CM | POA: Diagnosis not present

## 2019-08-31 DIAGNOSIS — J449 Chronic obstructive pulmonary disease, unspecified: Secondary | ICD-10-CM | POA: Diagnosis not present

## 2019-09-03 DIAGNOSIS — L89322 Pressure ulcer of left buttock, stage 2: Secondary | ICD-10-CM | POA: Diagnosis not present

## 2019-09-03 DIAGNOSIS — J9611 Chronic respiratory failure with hypoxia: Secondary | ICD-10-CM | POA: Diagnosis not present

## 2019-09-03 DIAGNOSIS — I5020 Unspecified systolic (congestive) heart failure: Secondary | ICD-10-CM | POA: Diagnosis not present

## 2019-09-03 DIAGNOSIS — S42292D Other displaced fracture of upper end of left humerus, subsequent encounter for fracture with routine healing: Secondary | ICD-10-CM | POA: Diagnosis not present

## 2019-09-03 DIAGNOSIS — E1151 Type 2 diabetes mellitus with diabetic peripheral angiopathy without gangrene: Secondary | ICD-10-CM | POA: Diagnosis not present

## 2019-09-03 DIAGNOSIS — A0811 Acute gastroenteropathy due to Norwalk agent: Secondary | ICD-10-CM | POA: Diagnosis not present

## 2019-09-03 DIAGNOSIS — I700 Atherosclerosis of aorta: Secondary | ICD-10-CM | POA: Diagnosis not present

## 2019-09-03 DIAGNOSIS — I482 Chronic atrial fibrillation, unspecified: Secondary | ICD-10-CM | POA: Diagnosis not present

## 2019-09-03 DIAGNOSIS — L89312 Pressure ulcer of right buttock, stage 2: Secondary | ICD-10-CM | POA: Diagnosis not present

## 2019-09-03 DIAGNOSIS — F0280 Dementia in other diseases classified elsewhere without behavioral disturbance: Secondary | ICD-10-CM | POA: Diagnosis not present

## 2019-09-03 DIAGNOSIS — B952 Enterococcus as the cause of diseases classified elsewhere: Secondary | ICD-10-CM | POA: Diagnosis not present

## 2019-09-03 DIAGNOSIS — I110 Hypertensive heart disease with heart failure: Secondary | ICD-10-CM | POA: Diagnosis not present

## 2019-09-03 DIAGNOSIS — N390 Urinary tract infection, site not specified: Secondary | ICD-10-CM | POA: Diagnosis not present

## 2019-09-03 DIAGNOSIS — F339 Major depressive disorder, recurrent, unspecified: Secondary | ICD-10-CM | POA: Diagnosis not present

## 2019-09-03 DIAGNOSIS — N183 Chronic kidney disease, stage 3 unspecified: Secondary | ICD-10-CM | POA: Diagnosis not present

## 2019-09-03 DIAGNOSIS — I0981 Rheumatic heart failure: Secondary | ICD-10-CM | POA: Diagnosis not present

## 2019-09-03 DIAGNOSIS — J449 Chronic obstructive pulmonary disease, unspecified: Secondary | ICD-10-CM | POA: Diagnosis not present

## 2019-09-03 DIAGNOSIS — L89021 Pressure ulcer of left elbow, stage 1: Secondary | ICD-10-CM | POA: Diagnosis not present

## 2019-09-03 DIAGNOSIS — E039 Hypothyroidism, unspecified: Secondary | ICD-10-CM | POA: Diagnosis not present

## 2019-09-03 DIAGNOSIS — I081 Rheumatic disorders of both mitral and tricuspid valves: Secondary | ICD-10-CM | POA: Diagnosis not present

## 2019-09-03 DIAGNOSIS — E042 Nontoxic multinodular goiter: Secondary | ICD-10-CM | POA: Diagnosis not present

## 2019-09-03 DIAGNOSIS — E1122 Type 2 diabetes mellitus with diabetic chronic kidney disease: Secondary | ICD-10-CM | POA: Diagnosis not present

## 2019-09-03 DIAGNOSIS — I2510 Atherosclerotic heart disease of native coronary artery without angina pectoris: Secondary | ICD-10-CM | POA: Diagnosis not present

## 2019-09-03 DIAGNOSIS — I5032 Chronic diastolic (congestive) heart failure: Secondary | ICD-10-CM | POA: Diagnosis not present

## 2019-09-03 DIAGNOSIS — D631 Anemia in chronic kidney disease: Secondary | ICD-10-CM | POA: Diagnosis not present

## 2019-09-04 DIAGNOSIS — S42292D Other displaced fracture of upper end of left humerus, subsequent encounter for fracture with routine healing: Secondary | ICD-10-CM | POA: Diagnosis not present

## 2019-09-04 DIAGNOSIS — L89322 Pressure ulcer of left buttock, stage 2: Secondary | ICD-10-CM | POA: Diagnosis not present

## 2019-09-04 DIAGNOSIS — J449 Chronic obstructive pulmonary disease, unspecified: Secondary | ICD-10-CM | POA: Diagnosis not present

## 2019-09-04 DIAGNOSIS — I482 Chronic atrial fibrillation, unspecified: Secondary | ICD-10-CM | POA: Diagnosis not present

## 2019-09-04 DIAGNOSIS — L89312 Pressure ulcer of right buttock, stage 2: Secondary | ICD-10-CM | POA: Diagnosis not present

## 2019-09-04 DIAGNOSIS — L89021 Pressure ulcer of left elbow, stage 1: Secondary | ICD-10-CM | POA: Diagnosis not present

## 2019-09-05 DIAGNOSIS — I482 Chronic atrial fibrillation, unspecified: Secondary | ICD-10-CM | POA: Diagnosis not present

## 2019-09-05 DIAGNOSIS — L89322 Pressure ulcer of left buttock, stage 2: Secondary | ICD-10-CM | POA: Diagnosis not present

## 2019-09-05 DIAGNOSIS — L89021 Pressure ulcer of left elbow, stage 1: Secondary | ICD-10-CM | POA: Diagnosis not present

## 2019-09-05 DIAGNOSIS — J449 Chronic obstructive pulmonary disease, unspecified: Secondary | ICD-10-CM | POA: Diagnosis not present

## 2019-09-05 DIAGNOSIS — S42292D Other displaced fracture of upper end of left humerus, subsequent encounter for fracture with routine healing: Secondary | ICD-10-CM | POA: Diagnosis not present

## 2019-09-05 DIAGNOSIS — L89312 Pressure ulcer of right buttock, stage 2: Secondary | ICD-10-CM | POA: Diagnosis not present

## 2019-09-06 DIAGNOSIS — J449 Chronic obstructive pulmonary disease, unspecified: Secondary | ICD-10-CM | POA: Diagnosis not present

## 2019-09-06 DIAGNOSIS — L89312 Pressure ulcer of right buttock, stage 2: Secondary | ICD-10-CM | POA: Diagnosis not present

## 2019-09-06 DIAGNOSIS — L89021 Pressure ulcer of left elbow, stage 1: Secondary | ICD-10-CM | POA: Diagnosis not present

## 2019-09-06 DIAGNOSIS — L89322 Pressure ulcer of left buttock, stage 2: Secondary | ICD-10-CM | POA: Diagnosis not present

## 2019-09-06 DIAGNOSIS — I482 Chronic atrial fibrillation, unspecified: Secondary | ICD-10-CM | POA: Diagnosis not present

## 2019-09-06 DIAGNOSIS — S42292D Other displaced fracture of upper end of left humerus, subsequent encounter for fracture with routine healing: Secondary | ICD-10-CM | POA: Diagnosis not present

## 2019-09-07 DIAGNOSIS — S42292D Other displaced fracture of upper end of left humerus, subsequent encounter for fracture with routine healing: Secondary | ICD-10-CM | POA: Diagnosis not present

## 2019-09-07 DIAGNOSIS — L89021 Pressure ulcer of left elbow, stage 1: Secondary | ICD-10-CM | POA: Diagnosis not present

## 2019-09-07 DIAGNOSIS — I482 Chronic atrial fibrillation, unspecified: Secondary | ICD-10-CM | POA: Diagnosis not present

## 2019-09-07 DIAGNOSIS — L89322 Pressure ulcer of left buttock, stage 2: Secondary | ICD-10-CM | POA: Diagnosis not present

## 2019-09-07 DIAGNOSIS — L89312 Pressure ulcer of right buttock, stage 2: Secondary | ICD-10-CM | POA: Diagnosis not present

## 2019-09-07 DIAGNOSIS — J449 Chronic obstructive pulmonary disease, unspecified: Secondary | ICD-10-CM | POA: Diagnosis not present

## 2019-09-11 DIAGNOSIS — J449 Chronic obstructive pulmonary disease, unspecified: Secondary | ICD-10-CM | POA: Diagnosis not present

## 2019-09-11 DIAGNOSIS — L89312 Pressure ulcer of right buttock, stage 2: Secondary | ICD-10-CM | POA: Diagnosis not present

## 2019-09-11 DIAGNOSIS — L89322 Pressure ulcer of left buttock, stage 2: Secondary | ICD-10-CM | POA: Diagnosis not present

## 2019-09-11 DIAGNOSIS — L89021 Pressure ulcer of left elbow, stage 1: Secondary | ICD-10-CM | POA: Diagnosis not present

## 2019-09-11 DIAGNOSIS — S42292D Other displaced fracture of upper end of left humerus, subsequent encounter for fracture with routine healing: Secondary | ICD-10-CM | POA: Diagnosis not present

## 2019-09-11 DIAGNOSIS — I482 Chronic atrial fibrillation, unspecified: Secondary | ICD-10-CM | POA: Diagnosis not present

## 2019-09-12 DIAGNOSIS — L89312 Pressure ulcer of right buttock, stage 2: Secondary | ICD-10-CM | POA: Diagnosis not present

## 2019-09-12 DIAGNOSIS — L89322 Pressure ulcer of left buttock, stage 2: Secondary | ICD-10-CM | POA: Diagnosis not present

## 2019-09-12 DIAGNOSIS — I482 Chronic atrial fibrillation, unspecified: Secondary | ICD-10-CM | POA: Diagnosis not present

## 2019-09-12 DIAGNOSIS — L89021 Pressure ulcer of left elbow, stage 1: Secondary | ICD-10-CM | POA: Diagnosis not present

## 2019-09-12 DIAGNOSIS — S42292D Other displaced fracture of upper end of left humerus, subsequent encounter for fracture with routine healing: Secondary | ICD-10-CM | POA: Diagnosis not present

## 2019-09-12 DIAGNOSIS — J449 Chronic obstructive pulmonary disease, unspecified: Secondary | ICD-10-CM | POA: Diagnosis not present

## 2019-09-13 DIAGNOSIS — S42292D Other displaced fracture of upper end of left humerus, subsequent encounter for fracture with routine healing: Secondary | ICD-10-CM | POA: Diagnosis not present

## 2019-09-13 DIAGNOSIS — J449 Chronic obstructive pulmonary disease, unspecified: Secondary | ICD-10-CM | POA: Diagnosis not present

## 2019-09-13 DIAGNOSIS — L89021 Pressure ulcer of left elbow, stage 1: Secondary | ICD-10-CM | POA: Diagnosis not present

## 2019-09-13 DIAGNOSIS — I482 Chronic atrial fibrillation, unspecified: Secondary | ICD-10-CM | POA: Diagnosis not present

## 2019-09-13 DIAGNOSIS — L89312 Pressure ulcer of right buttock, stage 2: Secondary | ICD-10-CM | POA: Diagnosis not present

## 2019-09-13 DIAGNOSIS — L89322 Pressure ulcer of left buttock, stage 2: Secondary | ICD-10-CM | POA: Diagnosis not present

## 2019-09-18 DIAGNOSIS — J449 Chronic obstructive pulmonary disease, unspecified: Secondary | ICD-10-CM | POA: Diagnosis not present

## 2019-09-18 DIAGNOSIS — L89312 Pressure ulcer of right buttock, stage 2: Secondary | ICD-10-CM | POA: Diagnosis not present

## 2019-09-18 DIAGNOSIS — S42292D Other displaced fracture of upper end of left humerus, subsequent encounter for fracture with routine healing: Secondary | ICD-10-CM | POA: Diagnosis not present

## 2019-09-18 DIAGNOSIS — L89021 Pressure ulcer of left elbow, stage 1: Secondary | ICD-10-CM | POA: Diagnosis not present

## 2019-09-18 DIAGNOSIS — L89322 Pressure ulcer of left buttock, stage 2: Secondary | ICD-10-CM | POA: Diagnosis not present

## 2019-09-18 DIAGNOSIS — I482 Chronic atrial fibrillation, unspecified: Secondary | ICD-10-CM | POA: Diagnosis not present

## 2019-09-22 DIAGNOSIS — S42292D Other displaced fracture of upper end of left humerus, subsequent encounter for fracture with routine healing: Secondary | ICD-10-CM | POA: Diagnosis not present

## 2019-09-22 DIAGNOSIS — L89322 Pressure ulcer of left buttock, stage 2: Secondary | ICD-10-CM | POA: Diagnosis not present

## 2019-09-22 DIAGNOSIS — L89021 Pressure ulcer of left elbow, stage 1: Secondary | ICD-10-CM | POA: Diagnosis not present

## 2019-09-22 DIAGNOSIS — J449 Chronic obstructive pulmonary disease, unspecified: Secondary | ICD-10-CM | POA: Diagnosis not present

## 2019-09-22 DIAGNOSIS — I482 Chronic atrial fibrillation, unspecified: Secondary | ICD-10-CM | POA: Diagnosis not present

## 2019-09-22 DIAGNOSIS — L89312 Pressure ulcer of right buttock, stage 2: Secondary | ICD-10-CM | POA: Diagnosis not present

## 2019-09-26 ENCOUNTER — Telehealth (INDEPENDENT_AMBULATORY_CARE_PROVIDER_SITE_OTHER): Payer: Medicare Other | Admitting: Cardiology

## 2019-09-26 ENCOUNTER — Encounter: Payer: Self-pay | Admitting: Cardiology

## 2019-09-26 VITALS — BP 128/78 | HR 78 | Ht 69.0 in

## 2019-09-26 DIAGNOSIS — I5032 Chronic diastolic (congestive) heart failure: Secondary | ICD-10-CM | POA: Diagnosis not present

## 2019-09-26 DIAGNOSIS — L89322 Pressure ulcer of left buttock, stage 2: Secondary | ICD-10-CM | POA: Diagnosis not present

## 2019-09-26 DIAGNOSIS — G459 Transient cerebral ischemic attack, unspecified: Secondary | ICD-10-CM

## 2019-09-26 DIAGNOSIS — L89312 Pressure ulcer of right buttock, stage 2: Secondary | ICD-10-CM | POA: Diagnosis not present

## 2019-09-26 DIAGNOSIS — R531 Weakness: Secondary | ICD-10-CM

## 2019-09-26 DIAGNOSIS — Z7901 Long term (current) use of anticoagulants: Secondary | ICD-10-CM

## 2019-09-26 DIAGNOSIS — Z794 Long term (current) use of insulin: Secondary | ICD-10-CM

## 2019-09-26 DIAGNOSIS — S42292D Other displaced fracture of upper end of left humerus, subsequent encounter for fracture with routine healing: Secondary | ICD-10-CM | POA: Diagnosis not present

## 2019-09-26 DIAGNOSIS — E119 Type 2 diabetes mellitus without complications: Secondary | ICD-10-CM

## 2019-09-26 DIAGNOSIS — I10 Essential (primary) hypertension: Secondary | ICD-10-CM

## 2019-09-26 DIAGNOSIS — L89021 Pressure ulcer of left elbow, stage 1: Secondary | ICD-10-CM | POA: Diagnosis not present

## 2019-09-26 DIAGNOSIS — I482 Chronic atrial fibrillation, unspecified: Secondary | ICD-10-CM | POA: Diagnosis not present

## 2019-09-26 DIAGNOSIS — Z951 Presence of aortocoronary bypass graft: Secondary | ICD-10-CM

## 2019-09-26 DIAGNOSIS — J449 Chronic obstructive pulmonary disease, unspecified: Secondary | ICD-10-CM | POA: Diagnosis not present

## 2019-09-26 NOTE — Progress Notes (Signed)
Virtual Visit via Telephone Note   This visit type was conducted due to national recommendations for restrictions regarding the COVID-19 Pandemic (e.g. social distancing) in an effort to limit this patient's exposure and mitigate transmission in our community.  Due to her co-morbid illnesses, this patient is at least at moderate risk for complications without adequate follow up.  This format is felt to be most appropriate for this patient at this time.  The patient did not have access to video technology/had technical difficulties with video requiring transitioning to audio format only (telephone).  All issues noted in this document were discussed and addressed.  No physical exam could be performed with this format.  Please refer to the patient's chart for her  consent to telehealth for [facility].   Date:  09/26/2019   ID:  Lindsay Nguyen, DOB 1936/01/06, MRN [id number]  Patient Location: Home Provider Location: Home  PCP:  [name], MD  Cardiologist:  [name], MD  Electrophysiologist:  None   Evaluation Performed:  Follow-Up Visit  Chief Complaint:  weakness  History of Present Illness:    Lindsay Nguyen is a 84 y.o. female with a history of CAD, chronic atrial fibrillation on Xarelto, and Alzheimer's type dementia.  Please see previous office notes for complete details if needed.  The patient's son [name] was an Therapist, sports on unit 2000 at [employer].  Her daughter Santiago Glad is a Designer, jewellery in [place].  The patient was contacted today for routine follow-up.  I spoke with her son [name] on the phone.  He tells me that his mother is essentially bedbound now.  He expresses frustration, it appears that the patient does not want to participate fully in physical therapy or mobilization.  Physical therapy is going to sign off.  From a cardiac standpoint the patient did have 1 or 2 episodes of chest pain that were nitrate responsive but she has not had any in a while.  The  patient does not have symptoms concerning for COVID-19 infection (fever, chills, cough, or new shortness of breath).    Past Medical History:  Diagnosis Date  . Anemia   . Atrial fibrillation ([place])    persistent; on Eliquis  . CAD (coronary artery disease)    2D ECHO, 11/04/2010 - EF >55%, mild-moderate mitral regurgitation, moderate tricuspid regurgitation  . Dementia ([place])   . Diabetes mellitus   . GERD (gastroesophageal reflux disease)   . Hyperlipidemia   . Hypertension   . Osteopenia   . Stroke ([place])   . Thoracic aortic aneurysm (HCC)    status post resection and grafting  . Thyroid disease    hypothyroid   Past Surgical History:  Procedure Laterality Date  . CARDIAC CATHETERIZATION  06/18/2010   Recommended CABG  . CARDIOVERSION N/A 09/30/2015   Procedure: CARDIOVERSION;  Surgeon: [name], MD;  Location[facility] ENDOSCOPY;  Service: Cardiovascular;  Laterality: N/A;  . CARDIOVERSION N/A 06/05/2016   Procedure: CARDIOVERSION;  Surgeon: [name], MD;  Location: [facility] ENDOSCOPY;  Service: Cardiovascular;  Laterality: N/A;  . CHOLECYSTECTOMY    . CORONARY ARTERY BYPASS GRAFT  06/18/2010   LIMA-LAD, vein-diagonal branch, vein-obtuse marginal branch, and resectioning and grafting of throacic aortic aneurysm  . ELBOW SURGERY  1979   fracture left  . THORACIC AORTIC ANEURYSM REPAIR    . TONSILLECTOMY  1945  . TUBAL LIGATION  1971     Current Meds  Medication Sig  . albuterol (PROVENTIL) (2.5 MG/3ML) 0.083%  nebulizer solution Take 2.5 mg by nebulization every 2 (two) hours as needed for wheezing or shortness of breath.   Marland Kitchen amLODipine (NORVASC) 5 MG tablet Take 1 tablet (5 mg total) by mouth daily.  Marland Kitchen aspirin EC 81 MG tablet Take 1 tablet (81 mg total) by mouth daily.  Marland Kitchen atorvastatin (LIPITOR) 40 MG tablet Take 1 tablet (40 mg total) by mouth daily at 6 PM.  . budesonide (PULMICORT) 0.25 MG/2ML nebulizer solution Take 0.25 mg by nebulization daily as needed (SOB).   .  clotrimazole-betamethasone (LOTRISONE) cream Apply 1 application topically 2 (two) times daily.  Marland Kitchen dicyclomine (BENTYL) 20 MG tablet TAKE 1 TABLET(20 MG) BY MOUTH FOUR TIMES DAILY AS NEEDED FOR SPASMS  . donepezil (ARICEPT) 10 MG tablet TAKE 1 TABLET(10 MG) BY MOUTH AT BEDTIME  . escitalopram (LEXAPRO) 10 MG tablet TAKE 1 TABLET(10 MG) BY MOUTH DAILY  . HYDROcodone-acetaminophen (NORCO) 5-325 MG tablet Take 1 tablet by mouth every 4 (four) hours as needed for moderate pain.  Marland Kitchen JANUVIA 100 MG tablet TAKE 1 TABLET BY MOUTH DAILY  . JARDIANCE 25 MG TABS tablet TAKE 1/2 TABLET BY MOUTH DAILY  . levothyroxine (SYNTHROID) 112 MCG tablet Take 0.5 tablets (56 mcg total) by mouth daily before breakfast.  . loratadine (CLARITIN) 10 MG tablet Take 10 mg by mouth daily.   Marland Kitchen losartan (COZAAR) 50 MG tablet TAKE 1 TABLET BY MOUTH DAILY(DISCONTINUE VALSARTAN)  . magnesium oxide (MAG-OX) 400 MG tablet Take 400 mg by mouth daily.  . memantine (NAMENDA) 10 MG tablet TAKE 1 TABLET(10 MG) BY MOUTH TWICE DAILY  . metFORMIN (GLUCOPHAGE) 500 MG tablet TAKE 2 TABLETS(1000 MG) BY MOUTH TWICE DAILY WITH A MEAL  . metoCLOPramide (REGLAN) 5 MG tablet TAKE 1 TABLET(5 MG) BY MOUTH FOUR TIMES DAILY BEFORE MEALS AND AT BEDTIME (Patient taking differently: Take 5 mg by mouth 3 (three) times daily. )  . metoprolol succinate (TOPROL-XL) 25 MG 24 hr tablet Take 1 tablet (25 mg total) by mouth daily.  . nitroGLYCERIN (NITROSTAT) 0.4 MG SL tablet Place 1 tablet (0.4 mg total) under the tongue every 5 (five) minutes as needed for chest pain (MAX 3 TABLETS).  . SYNTHROID 112 MCG tablet TAKE 1/2 TABLET BY MOUTH DAILY BEFORE BREAKFAST  . XARELTO 20 MG TABS tablet TAKE 1 TABLET(20 MG) BY MOUTH DAILY WITH SUPPER     Allergies:   Contrast media [iodinated diagnostic agents], Iodine, Shellfish allergy, and Gabapentin   Social History   Tobacco Use  . Smoking status: Never Smoker  . Smokeless tobacco: Never Used  Substance Use Topics    . Alcohol use: No  . Drug use: No     Family Hx: The patient's family history includes Alzheimer's disease in her father; Heart attack in her father; Heart disease in her father; Hypertension in her mother; Osteoporosis in her mother; Stroke in her father.  ROS:   Please see the history of present illness.    All other systems reviewed and are negative.   Prior CV studies:   The following studies were reviewed today: Echo 02/21/2018   Labs/Other Tests and Data Reviewed:    EKG:  An ECG dated 09/14/2018 was personally reviewed today and demonstrated:  AF with VR 75, LBBB  Recent Labs: 01/13/2019: ALT 10; BUN 20; Creat 1.02; Hemoglobin 12.8; Platelets 298; Potassium 4.0; Sodium 136   Recent Lipid Panel Lab Results  Component Value Date/Time   CHOL 127 11/26/2017 10:46 AM   TRIG 186 (H) 11/26/2017  10:46 AM   HDL 32 (L) 11/26/2017 10:46 AM   CHOLHDL 4.0 11/26/2017 10:46 AM   LDLCALC 70 11/26/2017 10:46 AM   LDLDIRECT 103.6 08/21/2009 08:46 AM    Wt Readings from Last 3 Encounters:  01/13/19 184 lb (83.5 kg)  09/19/18 191 lb (86.6 kg)  09/13/18 188 lb (85.3 kg)     Objective:    Vital Signs:  BP 128/78   Pulse 78   Ht 5\' 9"  (1.753 m)   BMI 27.17 kg/m    VITAL SIGNS:  reviewed  ASSESSMENT & PLAN:    Weakness The family reports the patient is basically bed bound now.  Palliative Care has been involved.   CAF (paroxysmal atrial fibrillation) (HCC) Rate controlled- she has been on low dose Toprol (bradycardia on BID Lopressor)  Hx of CABG Hx of CABG x 3 with thoracic aneurysm repair 2011.  Some chest pain that could be angina but her sone does not think it warrants further Rx at this time.   Acute diastolic (congestive) heart failure (HCC) History of diastolic CHF in 0000000- echoJuly 2019- EF 60-65%, moderate LVH  Chronic anticoagulation Eliquis- CHA2Ds2VASc=7  History of depression Hx of depression and mild cognitive deficit on Lexapro and  Aricept  Plan:  Supportive care at this point.  Virtual f/u in 6 months.  The have changed PCP and I will request recent labs.   COVID-19 Education: The signs and symptoms of COVID-19 were discussed with the patient and how to seek care for testing (follow up with PCP or arrange E-visit).  The importance of social distancing was discussed today.  Time:   Today, I have spent 10 minutes with the patient with telehealth technology discussing the above problems.     Medication Adjustments/Labs and Tests Ordered: Current medicines are reviewed at length with the patient today.  Concerns regarding medicines are outlined above.   Tests Ordered: No orders of the defined types were placed in this encounter.   Medication Changes: No orders of the defined types were placed in this encounter.   Follow Up:  Virtual Visit  6 months Dr Gwenlyn Found  Signed, [name], PA-C  09/26/2019 3:37 PM    [place]

## 2019-09-26 NOTE — Patient Instructions (Signed)
Medication Instructions:  Your physician recommends that you continue on your current medications as directed. Please refer to the Current Medication list given to you today.  *If you need a refill on your cardiac medications before your next appointment, please call your pharmacy*   Follow-Up: At [facility], you and your health needs are our priority.  As part of our continuing mission to provide you with exceptional heart care, we have created designated Provider Care Teams.  These Care Teams include your primary Cardiologist (physician) and Advanced Practice Providers (APPs -  Physician Assistants and Nurse Practitioners) who all work together to provide you with the care you need, when you need it.  Your next appointment:   6 month(s)  The format for your next appointment:   Virtual Visit   Provider:   [name], MD  Other Instructions Please call our office at [phone] two months ahead of time to schedule your follow-up appointment.

## 2019-09-26 NOTE — Addendum Note (Signed)
Addended by: Therisa Doyne on: 09/26/2019 03:50 PM   Modules accepted: Orders

## 2019-09-28 DIAGNOSIS — I482 Chronic atrial fibrillation, unspecified: Secondary | ICD-10-CM | POA: Diagnosis not present

## 2019-09-28 DIAGNOSIS — L89312 Pressure ulcer of right buttock, stage 2: Secondary | ICD-10-CM | POA: Diagnosis not present

## 2019-09-28 DIAGNOSIS — J449 Chronic obstructive pulmonary disease, unspecified: Secondary | ICD-10-CM | POA: Diagnosis not present

## 2019-09-28 DIAGNOSIS — S42292D Other displaced fracture of upper end of left humerus, subsequent encounter for fracture with routine healing: Secondary | ICD-10-CM | POA: Diagnosis not present

## 2019-09-28 DIAGNOSIS — L89021 Pressure ulcer of left elbow, stage 1: Secondary | ICD-10-CM | POA: Diagnosis not present

## 2019-09-28 DIAGNOSIS — L89322 Pressure ulcer of left buttock, stage 2: Secondary | ICD-10-CM | POA: Diagnosis not present

## 2019-09-29 DIAGNOSIS — I712 Thoracic aortic aneurysm, without rupture: Secondary | ICD-10-CM | POA: Diagnosis not present

## 2019-09-29 DIAGNOSIS — L89152 Pressure ulcer of sacral region, stage 2: Secondary | ICD-10-CM | POA: Diagnosis not present

## 2019-09-29 DIAGNOSIS — I700 Atherosclerosis of aorta: Secondary | ICD-10-CM | POA: Diagnosis not present

## 2019-09-29 DIAGNOSIS — E1122 Type 2 diabetes mellitus with diabetic chronic kidney disease: Secondary | ICD-10-CM | POA: Diagnosis not present

## 2019-09-29 DIAGNOSIS — I5032 Chronic diastolic (congestive) heart failure: Secondary | ICD-10-CM | POA: Diagnosis not present

## 2019-09-29 DIAGNOSIS — I4891 Unspecified atrial fibrillation: Secondary | ICD-10-CM | POA: Diagnosis not present

## 2019-09-29 DIAGNOSIS — D6869 Other thrombophilia: Secondary | ICD-10-CM | POA: Diagnosis not present

## 2019-09-29 DIAGNOSIS — J449 Chronic obstructive pulmonary disease, unspecified: Secondary | ICD-10-CM | POA: Diagnosis not present

## 2019-09-29 DIAGNOSIS — E46 Unspecified protein-calorie malnutrition: Secondary | ICD-10-CM | POA: Diagnosis not present

## 2019-09-29 DIAGNOSIS — N183 Chronic kidney disease, stage 3 unspecified: Secondary | ICD-10-CM | POA: Diagnosis not present

## 2019-09-29 DIAGNOSIS — E1151 Type 2 diabetes mellitus with diabetic peripheral angiopathy without gangrene: Secondary | ICD-10-CM | POA: Diagnosis not present

## 2019-09-29 DIAGNOSIS — J9611 Chronic respiratory failure with hypoxia: Secondary | ICD-10-CM | POA: Diagnosis not present

## 2019-09-30 DIAGNOSIS — L89312 Pressure ulcer of right buttock, stage 2: Secondary | ICD-10-CM | POA: Diagnosis not present

## 2019-09-30 DIAGNOSIS — S42292D Other displaced fracture of upper end of left humerus, subsequent encounter for fracture with routine healing: Secondary | ICD-10-CM | POA: Diagnosis not present

## 2019-09-30 DIAGNOSIS — L89322 Pressure ulcer of left buttock, stage 2: Secondary | ICD-10-CM | POA: Diagnosis not present

## 2019-09-30 DIAGNOSIS — I482 Chronic atrial fibrillation, unspecified: Secondary | ICD-10-CM | POA: Diagnosis not present

## 2019-09-30 DIAGNOSIS — L89021 Pressure ulcer of left elbow, stage 1: Secondary | ICD-10-CM | POA: Diagnosis not present

## 2019-09-30 DIAGNOSIS — J449 Chronic obstructive pulmonary disease, unspecified: Secondary | ICD-10-CM | POA: Diagnosis not present

## 2019-10-03 DIAGNOSIS — J9611 Chronic respiratory failure with hypoxia: Secondary | ICD-10-CM | POA: Diagnosis not present

## 2019-10-03 DIAGNOSIS — E46 Unspecified protein-calorie malnutrition: Secondary | ICD-10-CM | POA: Diagnosis not present

## 2019-10-03 DIAGNOSIS — F339 Major depressive disorder, recurrent, unspecified: Secondary | ICD-10-CM | POA: Diagnosis not present

## 2019-10-03 DIAGNOSIS — D631 Anemia in chronic kidney disease: Secondary | ICD-10-CM | POA: Diagnosis not present

## 2019-10-03 DIAGNOSIS — I0981 Rheumatic heart failure: Secondary | ICD-10-CM | POA: Diagnosis not present

## 2019-10-03 DIAGNOSIS — I700 Atherosclerosis of aorta: Secondary | ICD-10-CM | POA: Diagnosis not present

## 2019-10-03 DIAGNOSIS — F0280 Dementia in other diseases classified elsewhere without behavioral disturbance: Secondary | ICD-10-CM | POA: Diagnosis not present

## 2019-10-03 DIAGNOSIS — Z87440 Personal history of urinary (tract) infections: Secondary | ICD-10-CM | POA: Diagnosis not present

## 2019-10-03 DIAGNOSIS — S42292D Other displaced fracture of upper end of left humerus, subsequent encounter for fracture with routine healing: Secondary | ICD-10-CM | POA: Diagnosis not present

## 2019-10-03 DIAGNOSIS — E039 Hypothyroidism, unspecified: Secondary | ICD-10-CM | POA: Diagnosis not present

## 2019-10-03 DIAGNOSIS — I482 Chronic atrial fibrillation, unspecified: Secondary | ICD-10-CM | POA: Diagnosis not present

## 2019-10-03 DIAGNOSIS — E1151 Type 2 diabetes mellitus with diabetic peripheral angiopathy without gangrene: Secondary | ICD-10-CM | POA: Diagnosis not present

## 2019-10-03 DIAGNOSIS — G8929 Other chronic pain: Secondary | ICD-10-CM | POA: Diagnosis not present

## 2019-10-03 DIAGNOSIS — J449 Chronic obstructive pulmonary disease, unspecified: Secondary | ICD-10-CM | POA: Diagnosis not present

## 2019-10-03 DIAGNOSIS — I081 Rheumatic disorders of both mitral and tricuspid valves: Secondary | ICD-10-CM | POA: Diagnosis not present

## 2019-10-03 DIAGNOSIS — E042 Nontoxic multinodular goiter: Secondary | ICD-10-CM | POA: Diagnosis not present

## 2019-10-03 DIAGNOSIS — E1122 Type 2 diabetes mellitus with diabetic chronic kidney disease: Secondary | ICD-10-CM | POA: Diagnosis not present

## 2019-10-03 DIAGNOSIS — N183 Chronic kidney disease, stage 3 unspecified: Secondary | ICD-10-CM | POA: Diagnosis not present

## 2019-10-03 DIAGNOSIS — I110 Hypertensive heart disease with heart failure: Secondary | ICD-10-CM | POA: Diagnosis not present

## 2019-10-03 DIAGNOSIS — Z466 Encounter for fitting and adjustment of urinary device: Secondary | ICD-10-CM | POA: Diagnosis not present

## 2019-10-03 DIAGNOSIS — I5032 Chronic diastolic (congestive) heart failure: Secondary | ICD-10-CM | POA: Diagnosis not present

## 2019-10-03 DIAGNOSIS — I5020 Unspecified systolic (congestive) heart failure: Secondary | ICD-10-CM | POA: Diagnosis not present

## 2019-10-03 DIAGNOSIS — I2510 Atherosclerotic heart disease of native coronary artery without angina pectoris: Secondary | ICD-10-CM | POA: Diagnosis not present

## 2019-10-03 DIAGNOSIS — A0811 Acute gastroenteropathy due to Norwalk agent: Secondary | ICD-10-CM | POA: Diagnosis not present

## 2019-10-09 ENCOUNTER — Other Ambulatory Visit: Payer: Self-pay | Admitting: Family Medicine

## 2019-10-23 DIAGNOSIS — S42292D Other displaced fracture of upper end of left humerus, subsequent encounter for fracture with routine healing: Secondary | ICD-10-CM | POA: Diagnosis not present

## 2019-10-23 DIAGNOSIS — J9611 Chronic respiratory failure with hypoxia: Secondary | ICD-10-CM | POA: Diagnosis not present

## 2019-10-23 DIAGNOSIS — Z466 Encounter for fitting and adjustment of urinary device: Secondary | ICD-10-CM | POA: Diagnosis not present

## 2019-10-23 DIAGNOSIS — I2510 Atherosclerotic heart disease of native coronary artery without angina pectoris: Secondary | ICD-10-CM | POA: Diagnosis not present

## 2019-10-23 DIAGNOSIS — I482 Chronic atrial fibrillation, unspecified: Secondary | ICD-10-CM | POA: Diagnosis not present

## 2019-10-23 DIAGNOSIS — J449 Chronic obstructive pulmonary disease, unspecified: Secondary | ICD-10-CM | POA: Diagnosis not present

## 2019-11-02 DIAGNOSIS — I5020 Unspecified systolic (congestive) heart failure: Secondary | ICD-10-CM | POA: Diagnosis not present

## 2019-11-02 DIAGNOSIS — I110 Hypertensive heart disease with heart failure: Secondary | ICD-10-CM | POA: Diagnosis not present

## 2019-11-02 DIAGNOSIS — D631 Anemia in chronic kidney disease: Secondary | ICD-10-CM | POA: Diagnosis not present

## 2019-11-02 DIAGNOSIS — E1122 Type 2 diabetes mellitus with diabetic chronic kidney disease: Secondary | ICD-10-CM | POA: Diagnosis not present

## 2019-11-02 DIAGNOSIS — Z466 Encounter for fitting and adjustment of urinary device: Secondary | ICD-10-CM | POA: Diagnosis not present

## 2019-11-02 DIAGNOSIS — A0811 Acute gastroenteropathy due to Norwalk agent: Secondary | ICD-10-CM | POA: Diagnosis not present

## 2019-11-02 DIAGNOSIS — J449 Chronic obstructive pulmonary disease, unspecified: Secondary | ICD-10-CM | POA: Diagnosis not present

## 2019-11-02 DIAGNOSIS — E042 Nontoxic multinodular goiter: Secondary | ICD-10-CM | POA: Diagnosis not present

## 2019-11-02 DIAGNOSIS — E039 Hypothyroidism, unspecified: Secondary | ICD-10-CM | POA: Diagnosis not present

## 2019-11-02 DIAGNOSIS — I5032 Chronic diastolic (congestive) heart failure: Secondary | ICD-10-CM | POA: Diagnosis not present

## 2019-11-02 DIAGNOSIS — I0981 Rheumatic heart failure: Secondary | ICD-10-CM | POA: Diagnosis not present

## 2019-11-02 DIAGNOSIS — Z87440 Personal history of urinary (tract) infections: Secondary | ICD-10-CM | POA: Diagnosis not present

## 2019-11-02 DIAGNOSIS — E1151 Type 2 diabetes mellitus with diabetic peripheral angiopathy without gangrene: Secondary | ICD-10-CM | POA: Diagnosis not present

## 2019-11-02 DIAGNOSIS — F339 Major depressive disorder, recurrent, unspecified: Secondary | ICD-10-CM | POA: Diagnosis not present

## 2019-11-02 DIAGNOSIS — I081 Rheumatic disorders of both mitral and tricuspid valves: Secondary | ICD-10-CM | POA: Diagnosis not present

## 2019-11-02 DIAGNOSIS — I482 Chronic atrial fibrillation, unspecified: Secondary | ICD-10-CM | POA: Diagnosis not present

## 2019-11-02 DIAGNOSIS — G8929 Other chronic pain: Secondary | ICD-10-CM | POA: Diagnosis not present

## 2019-11-02 DIAGNOSIS — N183 Chronic kidney disease, stage 3 unspecified: Secondary | ICD-10-CM | POA: Diagnosis not present

## 2019-11-02 DIAGNOSIS — E46 Unspecified protein-calorie malnutrition: Secondary | ICD-10-CM | POA: Diagnosis not present

## 2019-11-02 DIAGNOSIS — I2510 Atherosclerotic heart disease of native coronary artery without angina pectoris: Secondary | ICD-10-CM | POA: Diagnosis not present

## 2019-11-02 DIAGNOSIS — F0280 Dementia in other diseases classified elsewhere without behavioral disturbance: Secondary | ICD-10-CM | POA: Diagnosis not present

## 2019-11-02 DIAGNOSIS — J9611 Chronic respiratory failure with hypoxia: Secondary | ICD-10-CM | POA: Diagnosis not present

## 2019-11-02 DIAGNOSIS — I700 Atherosclerosis of aorta: Secondary | ICD-10-CM | POA: Diagnosis not present

## 2019-11-02 DIAGNOSIS — S42292D Other displaced fracture of upper end of left humerus, subsequent encounter for fracture with routine healing: Secondary | ICD-10-CM | POA: Diagnosis not present

## 2019-11-06 ENCOUNTER — Other Ambulatory Visit: Payer: Self-pay | Admitting: Cardiology

## 2019-11-16 ENCOUNTER — Other Ambulatory Visit: Payer: Self-pay | Admitting: Family Medicine

## 2019-11-27 DIAGNOSIS — N390 Urinary tract infection, site not specified: Secondary | ICD-10-CM | POA: Diagnosis not present

## 2019-11-28 DIAGNOSIS — N390 Urinary tract infection, site not specified: Secondary | ICD-10-CM | POA: Diagnosis not present

## 2019-11-29 DIAGNOSIS — I482 Chronic atrial fibrillation, unspecified: Secondary | ICD-10-CM | POA: Diagnosis not present

## 2019-11-29 DIAGNOSIS — I2510 Atherosclerotic heart disease of native coronary artery without angina pectoris: Secondary | ICD-10-CM | POA: Diagnosis not present

## 2019-11-29 DIAGNOSIS — S42292D Other displaced fracture of upper end of left humerus, subsequent encounter for fracture with routine healing: Secondary | ICD-10-CM | POA: Diagnosis not present

## 2019-11-29 DIAGNOSIS — Z466 Encounter for fitting and adjustment of urinary device: Secondary | ICD-10-CM | POA: Diagnosis not present

## 2019-11-29 DIAGNOSIS — J9611 Chronic respiratory failure with hypoxia: Secondary | ICD-10-CM | POA: Diagnosis not present

## 2019-11-29 DIAGNOSIS — J449 Chronic obstructive pulmonary disease, unspecified: Secondary | ICD-10-CM | POA: Diagnosis not present

## 2019-12-07 DIAGNOSIS — E119 Type 2 diabetes mellitus without complications: Secondary | ICD-10-CM | POA: Diagnosis not present

## 2019-12-07 DIAGNOSIS — K219 Gastro-esophageal reflux disease without esophagitis: Secondary | ICD-10-CM | POA: Diagnosis not present

## 2019-12-07 DIAGNOSIS — G301 Alzheimer's disease with late onset: Secondary | ICD-10-CM | POA: Diagnosis not present

## 2019-12-07 DIAGNOSIS — Z87440 Personal history of urinary (tract) infections: Secondary | ICD-10-CM | POA: Diagnosis not present

## 2019-12-07 DIAGNOSIS — F0281 Dementia in other diseases classified elsewhere with behavioral disturbance: Secondary | ICD-10-CM | POA: Diagnosis not present

## 2019-12-07 DIAGNOSIS — I712 Thoracic aortic aneurysm, without rupture: Secondary | ICD-10-CM | POA: Diagnosis not present

## 2019-12-07 DIAGNOSIS — I482 Chronic atrial fibrillation, unspecified: Secondary | ICD-10-CM | POA: Diagnosis not present

## 2019-12-07 DIAGNOSIS — Z8673 Personal history of transient ischemic attack (TIA), and cerebral infarction without residual deficits: Secondary | ICD-10-CM | POA: Diagnosis not present

## 2019-12-07 DIAGNOSIS — I2510 Atherosclerotic heart disease of native coronary artery without angina pectoris: Secondary | ICD-10-CM | POA: Diagnosis not present

## 2019-12-07 DIAGNOSIS — R532 Functional quadriplegia: Secondary | ICD-10-CM | POA: Diagnosis not present

## 2019-12-07 DIAGNOSIS — F0390 Unspecified dementia without behavioral disturbance: Secondary | ICD-10-CM | POA: Diagnosis not present

## 2019-12-07 DIAGNOSIS — I10 Essential (primary) hypertension: Secondary | ICD-10-CM | POA: Diagnosis not present

## 2019-12-07 DIAGNOSIS — E039 Hypothyroidism, unspecified: Secondary | ICD-10-CM | POA: Diagnosis not present

## 2019-12-10 DIAGNOSIS — E119 Type 2 diabetes mellitus without complications: Secondary | ICD-10-CM | POA: Diagnosis not present

## 2019-12-10 DIAGNOSIS — I2510 Atherosclerotic heart disease of native coronary artery without angina pectoris: Secondary | ICD-10-CM | POA: Diagnosis not present

## 2019-12-10 DIAGNOSIS — F0281 Dementia in other diseases classified elsewhere with behavioral disturbance: Secondary | ICD-10-CM | POA: Diagnosis not present

## 2019-12-10 DIAGNOSIS — Z87440 Personal history of urinary (tract) infections: Secondary | ICD-10-CM | POA: Diagnosis not present

## 2019-12-10 DIAGNOSIS — G301 Alzheimer's disease with late onset: Secondary | ICD-10-CM | POA: Diagnosis not present

## 2019-12-10 DIAGNOSIS — I482 Chronic atrial fibrillation, unspecified: Secondary | ICD-10-CM | POA: Diagnosis not present

## 2019-12-11 DIAGNOSIS — I2510 Atherosclerotic heart disease of native coronary artery without angina pectoris: Secondary | ICD-10-CM | POA: Diagnosis not present

## 2019-12-11 DIAGNOSIS — G301 Alzheimer's disease with late onset: Secondary | ICD-10-CM | POA: Diagnosis not present

## 2019-12-11 DIAGNOSIS — E119 Type 2 diabetes mellitus without complications: Secondary | ICD-10-CM | POA: Diagnosis not present

## 2019-12-11 DIAGNOSIS — I482 Chronic atrial fibrillation, unspecified: Secondary | ICD-10-CM | POA: Diagnosis not present

## 2019-12-11 DIAGNOSIS — Z87440 Personal history of urinary (tract) infections: Secondary | ICD-10-CM | POA: Diagnosis not present

## 2019-12-11 DIAGNOSIS — F0281 Dementia in other diseases classified elsewhere with behavioral disturbance: Secondary | ICD-10-CM | POA: Diagnosis not present

## 2019-12-13 DIAGNOSIS — E119 Type 2 diabetes mellitus without complications: Secondary | ICD-10-CM | POA: Diagnosis not present

## 2019-12-13 DIAGNOSIS — Z87440 Personal history of urinary (tract) infections: Secondary | ICD-10-CM | POA: Diagnosis not present

## 2019-12-13 DIAGNOSIS — I2510 Atherosclerotic heart disease of native coronary artery without angina pectoris: Secondary | ICD-10-CM | POA: Diagnosis not present

## 2019-12-13 DIAGNOSIS — I482 Chronic atrial fibrillation, unspecified: Secondary | ICD-10-CM | POA: Diagnosis not present

## 2019-12-13 DIAGNOSIS — F0281 Dementia in other diseases classified elsewhere with behavioral disturbance: Secondary | ICD-10-CM | POA: Diagnosis not present

## 2019-12-13 DIAGNOSIS — G301 Alzheimer's disease with late onset: Secondary | ICD-10-CM | POA: Diagnosis not present

## 2019-12-14 DIAGNOSIS — F0281 Dementia in other diseases classified elsewhere with behavioral disturbance: Secondary | ICD-10-CM | POA: Diagnosis not present

## 2019-12-14 DIAGNOSIS — I482 Chronic atrial fibrillation, unspecified: Secondary | ICD-10-CM | POA: Diagnosis not present

## 2019-12-14 DIAGNOSIS — I2510 Atherosclerotic heart disease of native coronary artery without angina pectoris: Secondary | ICD-10-CM | POA: Diagnosis not present

## 2019-12-14 DIAGNOSIS — Z87440 Personal history of urinary (tract) infections: Secondary | ICD-10-CM | POA: Diagnosis not present

## 2019-12-14 DIAGNOSIS — E119 Type 2 diabetes mellitus without complications: Secondary | ICD-10-CM | POA: Diagnosis not present

## 2019-12-14 DIAGNOSIS — G301 Alzheimer's disease with late onset: Secondary | ICD-10-CM | POA: Diagnosis not present

## 2019-12-15 DIAGNOSIS — Z87440 Personal history of urinary (tract) infections: Secondary | ICD-10-CM | POA: Diagnosis not present

## 2019-12-15 DIAGNOSIS — I2510 Atherosclerotic heart disease of native coronary artery without angina pectoris: Secondary | ICD-10-CM | POA: Diagnosis not present

## 2019-12-15 DIAGNOSIS — I482 Chronic atrial fibrillation, unspecified: Secondary | ICD-10-CM | POA: Diagnosis not present

## 2019-12-15 DIAGNOSIS — G301 Alzheimer's disease with late onset: Secondary | ICD-10-CM | POA: Diagnosis not present

## 2019-12-15 DIAGNOSIS — E119 Type 2 diabetes mellitus without complications: Secondary | ICD-10-CM | POA: Diagnosis not present

## 2019-12-15 DIAGNOSIS — F0281 Dementia in other diseases classified elsewhere with behavioral disturbance: Secondary | ICD-10-CM | POA: Diagnosis not present

## 2019-12-18 DIAGNOSIS — F0281 Dementia in other diseases classified elsewhere with behavioral disturbance: Secondary | ICD-10-CM | POA: Diagnosis not present

## 2019-12-18 DIAGNOSIS — E119 Type 2 diabetes mellitus without complications: Secondary | ICD-10-CM | POA: Diagnosis not present

## 2019-12-18 DIAGNOSIS — I482 Chronic atrial fibrillation, unspecified: Secondary | ICD-10-CM | POA: Diagnosis not present

## 2019-12-18 DIAGNOSIS — I2510 Atherosclerotic heart disease of native coronary artery without angina pectoris: Secondary | ICD-10-CM | POA: Diagnosis not present

## 2019-12-18 DIAGNOSIS — G301 Alzheimer's disease with late onset: Secondary | ICD-10-CM | POA: Diagnosis not present

## 2019-12-18 DIAGNOSIS — Z87440 Personal history of urinary (tract) infections: Secondary | ICD-10-CM | POA: Diagnosis not present

## 2019-12-20 DIAGNOSIS — I2510 Atherosclerotic heart disease of native coronary artery without angina pectoris: Secondary | ICD-10-CM | POA: Diagnosis not present

## 2019-12-20 DIAGNOSIS — F0281 Dementia in other diseases classified elsewhere with behavioral disturbance: Secondary | ICD-10-CM | POA: Diagnosis not present

## 2019-12-20 DIAGNOSIS — G301 Alzheimer's disease with late onset: Secondary | ICD-10-CM | POA: Diagnosis not present

## 2019-12-20 DIAGNOSIS — Z87440 Personal history of urinary (tract) infections: Secondary | ICD-10-CM | POA: Diagnosis not present

## 2019-12-20 DIAGNOSIS — I482 Chronic atrial fibrillation, unspecified: Secondary | ICD-10-CM | POA: Diagnosis not present

## 2019-12-20 DIAGNOSIS — E119 Type 2 diabetes mellitus without complications: Secondary | ICD-10-CM | POA: Diagnosis not present

## 2019-12-22 DIAGNOSIS — E119 Type 2 diabetes mellitus without complications: Secondary | ICD-10-CM | POA: Diagnosis not present

## 2019-12-22 DIAGNOSIS — G301 Alzheimer's disease with late onset: Secondary | ICD-10-CM | POA: Diagnosis not present

## 2019-12-22 DIAGNOSIS — I2510 Atherosclerotic heart disease of native coronary artery without angina pectoris: Secondary | ICD-10-CM | POA: Diagnosis not present

## 2019-12-22 DIAGNOSIS — Z87440 Personal history of urinary (tract) infections: Secondary | ICD-10-CM | POA: Diagnosis not present

## 2019-12-22 DIAGNOSIS — F0281 Dementia in other diseases classified elsewhere with behavioral disturbance: Secondary | ICD-10-CM | POA: Diagnosis not present

## 2019-12-22 DIAGNOSIS — I482 Chronic atrial fibrillation, unspecified: Secondary | ICD-10-CM | POA: Diagnosis not present

## 2019-12-25 DIAGNOSIS — I482 Chronic atrial fibrillation, unspecified: Secondary | ICD-10-CM | POA: Diagnosis not present

## 2019-12-25 DIAGNOSIS — F0281 Dementia in other diseases classified elsewhere with behavioral disturbance: Secondary | ICD-10-CM | POA: Diagnosis not present

## 2019-12-25 DIAGNOSIS — E119 Type 2 diabetes mellitus without complications: Secondary | ICD-10-CM | POA: Diagnosis not present

## 2019-12-25 DIAGNOSIS — G301 Alzheimer's disease with late onset: Secondary | ICD-10-CM | POA: Diagnosis not present

## 2019-12-25 DIAGNOSIS — Z87440 Personal history of urinary (tract) infections: Secondary | ICD-10-CM | POA: Diagnosis not present

## 2019-12-25 DIAGNOSIS — I2510 Atherosclerotic heart disease of native coronary artery without angina pectoris: Secondary | ICD-10-CM | POA: Diagnosis not present

## 2019-12-27 DIAGNOSIS — E119 Type 2 diabetes mellitus without complications: Secondary | ICD-10-CM | POA: Diagnosis not present

## 2019-12-27 DIAGNOSIS — G301 Alzheimer's disease with late onset: Secondary | ICD-10-CM | POA: Diagnosis not present

## 2019-12-27 DIAGNOSIS — F0281 Dementia in other diseases classified elsewhere with behavioral disturbance: Secondary | ICD-10-CM | POA: Diagnosis not present

## 2019-12-27 DIAGNOSIS — I2510 Atherosclerotic heart disease of native coronary artery without angina pectoris: Secondary | ICD-10-CM | POA: Diagnosis not present

## 2019-12-27 DIAGNOSIS — I482 Chronic atrial fibrillation, unspecified: Secondary | ICD-10-CM | POA: Diagnosis not present

## 2019-12-27 DIAGNOSIS — Z87440 Personal history of urinary (tract) infections: Secondary | ICD-10-CM | POA: Diagnosis not present

## 2019-12-29 DIAGNOSIS — I482 Chronic atrial fibrillation, unspecified: Secondary | ICD-10-CM | POA: Diagnosis not present

## 2019-12-29 DIAGNOSIS — I2510 Atherosclerotic heart disease of native coronary artery without angina pectoris: Secondary | ICD-10-CM | POA: Diagnosis not present

## 2019-12-29 DIAGNOSIS — Z87440 Personal history of urinary (tract) infections: Secondary | ICD-10-CM | POA: Diagnosis not present

## 2019-12-29 DIAGNOSIS — G301 Alzheimer's disease with late onset: Secondary | ICD-10-CM | POA: Diagnosis not present

## 2019-12-29 DIAGNOSIS — E119 Type 2 diabetes mellitus without complications: Secondary | ICD-10-CM | POA: Diagnosis not present

## 2019-12-29 DIAGNOSIS — F0281 Dementia in other diseases classified elsewhere with behavioral disturbance: Secondary | ICD-10-CM | POA: Diagnosis not present

## 2019-12-31 DIAGNOSIS — I2510 Atherosclerotic heart disease of native coronary artery without angina pectoris: Secondary | ICD-10-CM | POA: Diagnosis not present

## 2019-12-31 DIAGNOSIS — Z87440 Personal history of urinary (tract) infections: Secondary | ICD-10-CM | POA: Diagnosis not present

## 2019-12-31 DIAGNOSIS — F0281 Dementia in other diseases classified elsewhere with behavioral disturbance: Secondary | ICD-10-CM | POA: Diagnosis not present

## 2019-12-31 DIAGNOSIS — E119 Type 2 diabetes mellitus without complications: Secondary | ICD-10-CM | POA: Diagnosis not present

## 2019-12-31 DIAGNOSIS — G301 Alzheimer's disease with late onset: Secondary | ICD-10-CM | POA: Diagnosis not present

## 2019-12-31 DIAGNOSIS — I482 Chronic atrial fibrillation, unspecified: Secondary | ICD-10-CM | POA: Diagnosis not present

## 2020-01-01 DIAGNOSIS — I2510 Atherosclerotic heart disease of native coronary artery without angina pectoris: Secondary | ICD-10-CM | POA: Diagnosis not present

## 2020-01-01 DIAGNOSIS — G301 Alzheimer's disease with late onset: Secondary | ICD-10-CM | POA: Diagnosis not present

## 2020-01-01 DIAGNOSIS — F0281 Dementia in other diseases classified elsewhere with behavioral disturbance: Secondary | ICD-10-CM | POA: Diagnosis not present

## 2020-01-01 DIAGNOSIS — E119 Type 2 diabetes mellitus without complications: Secondary | ICD-10-CM | POA: Diagnosis not present

## 2020-01-01 DIAGNOSIS — I482 Chronic atrial fibrillation, unspecified: Secondary | ICD-10-CM | POA: Diagnosis not present

## 2020-01-01 DIAGNOSIS — Z87440 Personal history of urinary (tract) infections: Secondary | ICD-10-CM | POA: Diagnosis not present

## 2020-01-10 ENCOUNTER — Other Ambulatory Visit: Payer: Self-pay | Admitting: Family Medicine

## 2020-02-01 ENCOUNTER — Other Ambulatory Visit: Payer: Self-pay | Admitting: Cardiovascular Disease

## 2020-02-01 DIAGNOSIS — I482 Chronic atrial fibrillation, unspecified: Secondary | ICD-10-CM | POA: Diagnosis not present

## 2020-02-01 DIAGNOSIS — K219 Gastro-esophageal reflux disease without esophagitis: Secondary | ICD-10-CM | POA: Diagnosis not present

## 2020-02-01 DIAGNOSIS — I2510 Atherosclerotic heart disease of native coronary artery without angina pectoris: Secondary | ICD-10-CM | POA: Diagnosis not present

## 2020-02-01 DIAGNOSIS — Z8673 Personal history of transient ischemic attack (TIA), and cerebral infarction without residual deficits: Secondary | ICD-10-CM | POA: Diagnosis not present

## 2020-02-01 DIAGNOSIS — I712 Thoracic aortic aneurysm, without rupture: Secondary | ICD-10-CM | POA: Diagnosis not present

## 2020-02-01 DIAGNOSIS — E119 Type 2 diabetes mellitus without complications: Secondary | ICD-10-CM | POA: Diagnosis not present

## 2020-02-01 DIAGNOSIS — F0281 Dementia in other diseases classified elsewhere with behavioral disturbance: Secondary | ICD-10-CM | POA: Diagnosis not present

## 2020-02-01 DIAGNOSIS — I10 Essential (primary) hypertension: Secondary | ICD-10-CM | POA: Diagnosis not present

## 2020-02-01 DIAGNOSIS — G301 Alzheimer's disease with late onset: Secondary | ICD-10-CM | POA: Diagnosis not present

## 2020-02-01 DIAGNOSIS — E039 Hypothyroidism, unspecified: Secondary | ICD-10-CM | POA: Diagnosis not present

## 2020-02-01 DIAGNOSIS — Z87440 Personal history of urinary (tract) infections: Secondary | ICD-10-CM | POA: Diagnosis not present

## 2020-02-02 DIAGNOSIS — Z87440 Personal history of urinary (tract) infections: Secondary | ICD-10-CM | POA: Diagnosis not present

## 2020-02-02 DIAGNOSIS — E119 Type 2 diabetes mellitus without complications: Secondary | ICD-10-CM | POA: Diagnosis not present

## 2020-02-02 DIAGNOSIS — G301 Alzheimer's disease with late onset: Secondary | ICD-10-CM | POA: Diagnosis not present

## 2020-02-02 DIAGNOSIS — I2510 Atherosclerotic heart disease of native coronary artery without angina pectoris: Secondary | ICD-10-CM | POA: Diagnosis not present

## 2020-02-02 DIAGNOSIS — I482 Chronic atrial fibrillation, unspecified: Secondary | ICD-10-CM | POA: Diagnosis not present

## 2020-02-02 DIAGNOSIS — F0281 Dementia in other diseases classified elsewhere with behavioral disturbance: Secondary | ICD-10-CM | POA: Diagnosis not present

## 2020-02-06 DIAGNOSIS — I482 Chronic atrial fibrillation, unspecified: Secondary | ICD-10-CM | POA: Diagnosis not present

## 2020-02-06 DIAGNOSIS — G301 Alzheimer's disease with late onset: Secondary | ICD-10-CM | POA: Diagnosis not present

## 2020-02-06 DIAGNOSIS — Z87440 Personal history of urinary (tract) infections: Secondary | ICD-10-CM | POA: Diagnosis not present

## 2020-02-06 DIAGNOSIS — I2510 Atherosclerotic heart disease of native coronary artery without angina pectoris: Secondary | ICD-10-CM | POA: Diagnosis not present

## 2020-02-06 DIAGNOSIS — F0281 Dementia in other diseases classified elsewhere with behavioral disturbance: Secondary | ICD-10-CM | POA: Diagnosis not present

## 2020-02-06 DIAGNOSIS — E119 Type 2 diabetes mellitus without complications: Secondary | ICD-10-CM | POA: Diagnosis not present

## 2020-02-07 DIAGNOSIS — Z87440 Personal history of urinary (tract) infections: Secondary | ICD-10-CM | POA: Diagnosis not present

## 2020-02-07 DIAGNOSIS — E119 Type 2 diabetes mellitus without complications: Secondary | ICD-10-CM | POA: Diagnosis not present

## 2020-02-07 DIAGNOSIS — I2510 Atherosclerotic heart disease of native coronary artery without angina pectoris: Secondary | ICD-10-CM | POA: Diagnosis not present

## 2020-02-07 DIAGNOSIS — F0281 Dementia in other diseases classified elsewhere with behavioral disturbance: Secondary | ICD-10-CM | POA: Diagnosis not present

## 2020-02-07 DIAGNOSIS — G301 Alzheimer's disease with late onset: Secondary | ICD-10-CM | POA: Diagnosis not present

## 2020-02-07 DIAGNOSIS — I482 Chronic atrial fibrillation, unspecified: Secondary | ICD-10-CM | POA: Diagnosis not present

## 2020-02-08 DIAGNOSIS — I2510 Atherosclerotic heart disease of native coronary artery without angina pectoris: Secondary | ICD-10-CM | POA: Diagnosis not present

## 2020-02-08 DIAGNOSIS — I482 Chronic atrial fibrillation, unspecified: Secondary | ICD-10-CM | POA: Diagnosis not present

## 2020-02-08 DIAGNOSIS — Z87440 Personal history of urinary (tract) infections: Secondary | ICD-10-CM | POA: Diagnosis not present

## 2020-02-08 DIAGNOSIS — F0281 Dementia in other diseases classified elsewhere with behavioral disturbance: Secondary | ICD-10-CM | POA: Diagnosis not present

## 2020-02-08 DIAGNOSIS — E119 Type 2 diabetes mellitus without complications: Secondary | ICD-10-CM | POA: Diagnosis not present

## 2020-02-08 DIAGNOSIS — G301 Alzheimer's disease with late onset: Secondary | ICD-10-CM | POA: Diagnosis not present

## 2020-02-09 DIAGNOSIS — E119 Type 2 diabetes mellitus without complications: Secondary | ICD-10-CM | POA: Diagnosis not present

## 2020-02-09 DIAGNOSIS — I2510 Atherosclerotic heart disease of native coronary artery without angina pectoris: Secondary | ICD-10-CM | POA: Diagnosis not present

## 2020-02-09 DIAGNOSIS — Z87440 Personal history of urinary (tract) infections: Secondary | ICD-10-CM | POA: Diagnosis not present

## 2020-02-09 DIAGNOSIS — F0281 Dementia in other diseases classified elsewhere with behavioral disturbance: Secondary | ICD-10-CM | POA: Diagnosis not present

## 2020-02-09 DIAGNOSIS — I482 Chronic atrial fibrillation, unspecified: Secondary | ICD-10-CM | POA: Diagnosis not present

## 2020-02-09 DIAGNOSIS — G301 Alzheimer's disease with late onset: Secondary | ICD-10-CM | POA: Diagnosis not present

## 2020-02-12 DIAGNOSIS — Z87440 Personal history of urinary (tract) infections: Secondary | ICD-10-CM | POA: Diagnosis not present

## 2020-02-12 DIAGNOSIS — G301 Alzheimer's disease with late onset: Secondary | ICD-10-CM | POA: Diagnosis not present

## 2020-02-12 DIAGNOSIS — E119 Type 2 diabetes mellitus without complications: Secondary | ICD-10-CM | POA: Diagnosis not present

## 2020-02-12 DIAGNOSIS — F0281 Dementia in other diseases classified elsewhere with behavioral disturbance: Secondary | ICD-10-CM | POA: Diagnosis not present

## 2020-02-12 DIAGNOSIS — I482 Chronic atrial fibrillation, unspecified: Secondary | ICD-10-CM | POA: Diagnosis not present

## 2020-02-12 DIAGNOSIS — I2510 Atherosclerotic heart disease of native coronary artery without angina pectoris: Secondary | ICD-10-CM | POA: Diagnosis not present

## 2020-02-13 DIAGNOSIS — Z87440 Personal history of urinary (tract) infections: Secondary | ICD-10-CM | POA: Diagnosis not present

## 2020-02-13 DIAGNOSIS — F0281 Dementia in other diseases classified elsewhere with behavioral disturbance: Secondary | ICD-10-CM | POA: Diagnosis not present

## 2020-02-13 DIAGNOSIS — I482 Chronic atrial fibrillation, unspecified: Secondary | ICD-10-CM | POA: Diagnosis not present

## 2020-02-13 DIAGNOSIS — I2510 Atherosclerotic heart disease of native coronary artery without angina pectoris: Secondary | ICD-10-CM | POA: Diagnosis not present

## 2020-02-13 DIAGNOSIS — G301 Alzheimer's disease with late onset: Secondary | ICD-10-CM | POA: Diagnosis not present

## 2020-02-13 DIAGNOSIS — E119 Type 2 diabetes mellitus without complications: Secondary | ICD-10-CM | POA: Diagnosis not present

## 2020-02-14 DIAGNOSIS — E119 Type 2 diabetes mellitus without complications: Secondary | ICD-10-CM | POA: Diagnosis not present

## 2020-02-14 DIAGNOSIS — I482 Chronic atrial fibrillation, unspecified: Secondary | ICD-10-CM | POA: Diagnosis not present

## 2020-02-14 DIAGNOSIS — F0281 Dementia in other diseases classified elsewhere with behavioral disturbance: Secondary | ICD-10-CM | POA: Diagnosis not present

## 2020-02-14 DIAGNOSIS — Z87440 Personal history of urinary (tract) infections: Secondary | ICD-10-CM | POA: Diagnosis not present

## 2020-02-14 DIAGNOSIS — G301 Alzheimer's disease with late onset: Secondary | ICD-10-CM | POA: Diagnosis not present

## 2020-02-14 DIAGNOSIS — I2510 Atherosclerotic heart disease of native coronary artery without angina pectoris: Secondary | ICD-10-CM | POA: Diagnosis not present

## 2020-02-16 DIAGNOSIS — E119 Type 2 diabetes mellitus without complications: Secondary | ICD-10-CM | POA: Diagnosis not present

## 2020-02-16 DIAGNOSIS — I2510 Atherosclerotic heart disease of native coronary artery without angina pectoris: Secondary | ICD-10-CM | POA: Diagnosis not present

## 2020-02-16 DIAGNOSIS — I482 Chronic atrial fibrillation, unspecified: Secondary | ICD-10-CM | POA: Diagnosis not present

## 2020-02-16 DIAGNOSIS — Z87440 Personal history of urinary (tract) infections: Secondary | ICD-10-CM | POA: Diagnosis not present

## 2020-02-16 DIAGNOSIS — G301 Alzheimer's disease with late onset: Secondary | ICD-10-CM | POA: Diagnosis not present

## 2020-02-16 DIAGNOSIS — F0281 Dementia in other diseases classified elsewhere with behavioral disturbance: Secondary | ICD-10-CM | POA: Diagnosis not present

## 2020-02-19 DIAGNOSIS — F0281 Dementia in other diseases classified elsewhere with behavioral disturbance: Secondary | ICD-10-CM | POA: Diagnosis not present

## 2020-02-19 DIAGNOSIS — I2510 Atherosclerotic heart disease of native coronary artery without angina pectoris: Secondary | ICD-10-CM | POA: Diagnosis not present

## 2020-02-19 DIAGNOSIS — E119 Type 2 diabetes mellitus without complications: Secondary | ICD-10-CM | POA: Diagnosis not present

## 2020-02-19 DIAGNOSIS — G301 Alzheimer's disease with late onset: Secondary | ICD-10-CM | POA: Diagnosis not present

## 2020-02-19 DIAGNOSIS — Z87440 Personal history of urinary (tract) infections: Secondary | ICD-10-CM | POA: Diagnosis not present

## 2020-02-19 DIAGNOSIS — I482 Chronic atrial fibrillation, unspecified: Secondary | ICD-10-CM | POA: Diagnosis not present

## 2020-02-20 DIAGNOSIS — G301 Alzheimer's disease with late onset: Secondary | ICD-10-CM | POA: Diagnosis not present

## 2020-02-20 DIAGNOSIS — Z87440 Personal history of urinary (tract) infections: Secondary | ICD-10-CM | POA: Diagnosis not present

## 2020-02-20 DIAGNOSIS — F0281 Dementia in other diseases classified elsewhere with behavioral disturbance: Secondary | ICD-10-CM | POA: Diagnosis not present

## 2020-02-20 DIAGNOSIS — I2510 Atherosclerotic heart disease of native coronary artery without angina pectoris: Secondary | ICD-10-CM | POA: Diagnosis not present

## 2020-02-20 DIAGNOSIS — E119 Type 2 diabetes mellitus without complications: Secondary | ICD-10-CM | POA: Diagnosis not present

## 2020-02-20 DIAGNOSIS — I482 Chronic atrial fibrillation, unspecified: Secondary | ICD-10-CM | POA: Diagnosis not present

## 2020-02-21 DIAGNOSIS — G301 Alzheimer's disease with late onset: Secondary | ICD-10-CM | POA: Diagnosis not present

## 2020-02-21 DIAGNOSIS — Z87440 Personal history of urinary (tract) infections: Secondary | ICD-10-CM | POA: Diagnosis not present

## 2020-02-21 DIAGNOSIS — I2510 Atherosclerotic heart disease of native coronary artery without angina pectoris: Secondary | ICD-10-CM | POA: Diagnosis not present

## 2020-02-21 DIAGNOSIS — F0281 Dementia in other diseases classified elsewhere with behavioral disturbance: Secondary | ICD-10-CM | POA: Diagnosis not present

## 2020-02-21 DIAGNOSIS — I482 Chronic atrial fibrillation, unspecified: Secondary | ICD-10-CM | POA: Diagnosis not present

## 2020-02-21 DIAGNOSIS — E119 Type 2 diabetes mellitus without complications: Secondary | ICD-10-CM | POA: Diagnosis not present

## 2020-02-22 DIAGNOSIS — I2510 Atherosclerotic heart disease of native coronary artery without angina pectoris: Secondary | ICD-10-CM | POA: Diagnosis not present

## 2020-02-22 DIAGNOSIS — Z87440 Personal history of urinary (tract) infections: Secondary | ICD-10-CM | POA: Diagnosis not present

## 2020-02-22 DIAGNOSIS — G301 Alzheimer's disease with late onset: Secondary | ICD-10-CM | POA: Diagnosis not present

## 2020-02-22 DIAGNOSIS — I482 Chronic atrial fibrillation, unspecified: Secondary | ICD-10-CM | POA: Diagnosis not present

## 2020-02-22 DIAGNOSIS — E119 Type 2 diabetes mellitus without complications: Secondary | ICD-10-CM | POA: Diagnosis not present

## 2020-02-22 DIAGNOSIS — F0281 Dementia in other diseases classified elsewhere with behavioral disturbance: Secondary | ICD-10-CM | POA: Diagnosis not present

## 2020-02-23 DIAGNOSIS — E119 Type 2 diabetes mellitus without complications: Secondary | ICD-10-CM | POA: Diagnosis not present

## 2020-02-23 DIAGNOSIS — G301 Alzheimer's disease with late onset: Secondary | ICD-10-CM | POA: Diagnosis not present

## 2020-02-23 DIAGNOSIS — I482 Chronic atrial fibrillation, unspecified: Secondary | ICD-10-CM | POA: Diagnosis not present

## 2020-02-23 DIAGNOSIS — Z87440 Personal history of urinary (tract) infections: Secondary | ICD-10-CM | POA: Diagnosis not present

## 2020-02-23 DIAGNOSIS — F0281 Dementia in other diseases classified elsewhere with behavioral disturbance: Secondary | ICD-10-CM | POA: Diagnosis not present

## 2020-02-23 DIAGNOSIS — I2510 Atherosclerotic heart disease of native coronary artery without angina pectoris: Secondary | ICD-10-CM | POA: Diagnosis not present

## 2020-02-24 DIAGNOSIS — E119 Type 2 diabetes mellitus without complications: Secondary | ICD-10-CM | POA: Diagnosis not present

## 2020-02-24 DIAGNOSIS — G301 Alzheimer's disease with late onset: Secondary | ICD-10-CM | POA: Diagnosis not present

## 2020-02-24 DIAGNOSIS — I482 Chronic atrial fibrillation, unspecified: Secondary | ICD-10-CM | POA: Diagnosis not present

## 2020-02-24 DIAGNOSIS — F0281 Dementia in other diseases classified elsewhere with behavioral disturbance: Secondary | ICD-10-CM | POA: Diagnosis not present

## 2020-02-24 DIAGNOSIS — Z87440 Personal history of urinary (tract) infections: Secondary | ICD-10-CM | POA: Diagnosis not present

## 2020-02-24 DIAGNOSIS — I2510 Atherosclerotic heart disease of native coronary artery without angina pectoris: Secondary | ICD-10-CM | POA: Diagnosis not present

## 2020-02-25 DIAGNOSIS — G301 Alzheimer's disease with late onset: Secondary | ICD-10-CM | POA: Diagnosis not present

## 2020-02-25 DIAGNOSIS — F0281 Dementia in other diseases classified elsewhere with behavioral disturbance: Secondary | ICD-10-CM | POA: Diagnosis not present

## 2020-02-25 DIAGNOSIS — I482 Chronic atrial fibrillation, unspecified: Secondary | ICD-10-CM | POA: Diagnosis not present

## 2020-02-25 DIAGNOSIS — I2510 Atherosclerotic heart disease of native coronary artery without angina pectoris: Secondary | ICD-10-CM | POA: Diagnosis not present

## 2020-02-25 DIAGNOSIS — E119 Type 2 diabetes mellitus without complications: Secondary | ICD-10-CM | POA: Diagnosis not present

## 2020-02-25 DIAGNOSIS — Z87440 Personal history of urinary (tract) infections: Secondary | ICD-10-CM | POA: Diagnosis not present

## 2020-02-26 DIAGNOSIS — I2510 Atherosclerotic heart disease of native coronary artery without angina pectoris: Secondary | ICD-10-CM | POA: Diagnosis not present

## 2020-02-26 DIAGNOSIS — E119 Type 2 diabetes mellitus without complications: Secondary | ICD-10-CM | POA: Diagnosis not present

## 2020-02-26 DIAGNOSIS — Z87440 Personal history of urinary (tract) infections: Secondary | ICD-10-CM | POA: Diagnosis not present

## 2020-02-26 DIAGNOSIS — I482 Chronic atrial fibrillation, unspecified: Secondary | ICD-10-CM | POA: Diagnosis not present

## 2020-02-26 DIAGNOSIS — G301 Alzheimer's disease with late onset: Secondary | ICD-10-CM | POA: Diagnosis not present

## 2020-02-26 DIAGNOSIS — F0281 Dementia in other diseases classified elsewhere with behavioral disturbance: Secondary | ICD-10-CM | POA: Diagnosis not present

## 2020-02-27 DIAGNOSIS — I2510 Atherosclerotic heart disease of native coronary artery without angina pectoris: Secondary | ICD-10-CM | POA: Diagnosis not present

## 2020-02-27 DIAGNOSIS — I482 Chronic atrial fibrillation, unspecified: Secondary | ICD-10-CM | POA: Diagnosis not present

## 2020-02-27 DIAGNOSIS — F0281 Dementia in other diseases classified elsewhere with behavioral disturbance: Secondary | ICD-10-CM | POA: Diagnosis not present

## 2020-02-27 DIAGNOSIS — Z87440 Personal history of urinary (tract) infections: Secondary | ICD-10-CM | POA: Diagnosis not present

## 2020-02-27 DIAGNOSIS — G301 Alzheimer's disease with late onset: Secondary | ICD-10-CM | POA: Diagnosis not present

## 2020-02-27 DIAGNOSIS — E119 Type 2 diabetes mellitus without complications: Secondary | ICD-10-CM | POA: Diagnosis not present

## 2020-02-28 DIAGNOSIS — E119 Type 2 diabetes mellitus without complications: Secondary | ICD-10-CM | POA: Diagnosis not present

## 2020-02-28 DIAGNOSIS — G301 Alzheimer's disease with late onset: Secondary | ICD-10-CM | POA: Diagnosis not present

## 2020-02-28 DIAGNOSIS — I482 Chronic atrial fibrillation, unspecified: Secondary | ICD-10-CM | POA: Diagnosis not present

## 2020-02-28 DIAGNOSIS — Z87440 Personal history of urinary (tract) infections: Secondary | ICD-10-CM | POA: Diagnosis not present

## 2020-02-28 DIAGNOSIS — I2510 Atherosclerotic heart disease of native coronary artery without angina pectoris: Secondary | ICD-10-CM | POA: Diagnosis not present

## 2020-02-28 DIAGNOSIS — F0281 Dementia in other diseases classified elsewhere with behavioral disturbance: Secondary | ICD-10-CM | POA: Diagnosis not present

## 2020-02-29 DIAGNOSIS — Z87440 Personal history of urinary (tract) infections: Secondary | ICD-10-CM | POA: Diagnosis not present

## 2020-02-29 DIAGNOSIS — E119 Type 2 diabetes mellitus without complications: Secondary | ICD-10-CM | POA: Diagnosis not present

## 2020-02-29 DIAGNOSIS — I482 Chronic atrial fibrillation, unspecified: Secondary | ICD-10-CM | POA: Diagnosis not present

## 2020-02-29 DIAGNOSIS — F0281 Dementia in other diseases classified elsewhere with behavioral disturbance: Secondary | ICD-10-CM | POA: Diagnosis not present

## 2020-02-29 DIAGNOSIS — G301 Alzheimer's disease with late onset: Secondary | ICD-10-CM | POA: Diagnosis not present

## 2020-02-29 DIAGNOSIS — I2510 Atherosclerotic heart disease of native coronary artery without angina pectoris: Secondary | ICD-10-CM | POA: Diagnosis not present

## 2020-03-01 DIAGNOSIS — F0281 Dementia in other diseases classified elsewhere with behavioral disturbance: Secondary | ICD-10-CM | POA: Diagnosis not present

## 2020-03-01 DIAGNOSIS — I482 Chronic atrial fibrillation, unspecified: Secondary | ICD-10-CM | POA: Diagnosis not present

## 2020-03-01 DIAGNOSIS — G301 Alzheimer's disease with late onset: Secondary | ICD-10-CM | POA: Diagnosis not present

## 2020-03-01 DIAGNOSIS — E119 Type 2 diabetes mellitus without complications: Secondary | ICD-10-CM | POA: Diagnosis not present

## 2020-03-01 DIAGNOSIS — Z87440 Personal history of urinary (tract) infections: Secondary | ICD-10-CM | POA: Diagnosis not present

## 2020-03-01 DIAGNOSIS — I2510 Atherosclerotic heart disease of native coronary artery without angina pectoris: Secondary | ICD-10-CM | POA: Diagnosis not present

## 2020-03-02 DIAGNOSIS — I2510 Atherosclerotic heart disease of native coronary artery without angina pectoris: Secondary | ICD-10-CM | POA: Diagnosis not present

## 2020-03-02 DIAGNOSIS — G301 Alzheimer's disease with late onset: Secondary | ICD-10-CM | POA: Diagnosis not present

## 2020-03-02 DIAGNOSIS — F0281 Dementia in other diseases classified elsewhere with behavioral disturbance: Secondary | ICD-10-CM | POA: Diagnosis not present

## 2020-03-02 DIAGNOSIS — I482 Chronic atrial fibrillation, unspecified: Secondary | ICD-10-CM | POA: Diagnosis not present

## 2020-03-02 DIAGNOSIS — Z87440 Personal history of urinary (tract) infections: Secondary | ICD-10-CM | POA: Diagnosis not present

## 2020-03-02 DIAGNOSIS — E119 Type 2 diabetes mellitus without complications: Secondary | ICD-10-CM | POA: Diagnosis not present

## 2020-03-03 DIAGNOSIS — G301 Alzheimer's disease with late onset: Secondary | ICD-10-CM | POA: Diagnosis not present

## 2020-03-03 DIAGNOSIS — Z8673 Personal history of transient ischemic attack (TIA), and cerebral infarction without residual deficits: Secondary | ICD-10-CM | POA: Diagnosis not present

## 2020-03-03 DIAGNOSIS — I10 Essential (primary) hypertension: Secondary | ICD-10-CM | POA: Diagnosis not present

## 2020-03-03 DIAGNOSIS — E039 Hypothyroidism, unspecified: Secondary | ICD-10-CM | POA: Diagnosis not present

## 2020-03-03 DIAGNOSIS — I482 Chronic atrial fibrillation, unspecified: Secondary | ICD-10-CM | POA: Diagnosis not present

## 2020-03-03 DIAGNOSIS — I712 Thoracic aortic aneurysm, without rupture: Secondary | ICD-10-CM | POA: Diagnosis not present

## 2020-03-03 DIAGNOSIS — E119 Type 2 diabetes mellitus without complications: Secondary | ICD-10-CM | POA: Diagnosis not present

## 2020-03-03 DIAGNOSIS — F0281 Dementia in other diseases classified elsewhere with behavioral disturbance: Secondary | ICD-10-CM | POA: Diagnosis not present

## 2020-03-03 DIAGNOSIS — Z87440 Personal history of urinary (tract) infections: Secondary | ICD-10-CM | POA: Diagnosis not present

## 2020-03-03 DIAGNOSIS — I2510 Atherosclerotic heart disease of native coronary artery without angina pectoris: Secondary | ICD-10-CM | POA: Diagnosis not present

## 2020-03-03 DIAGNOSIS — K219 Gastro-esophageal reflux disease without esophagitis: Secondary | ICD-10-CM | POA: Diagnosis not present

## 2020-03-04 DIAGNOSIS — G301 Alzheimer's disease with late onset: Secondary | ICD-10-CM | POA: Diagnosis not present

## 2020-03-04 DIAGNOSIS — I2510 Atherosclerotic heart disease of native coronary artery without angina pectoris: Secondary | ICD-10-CM | POA: Diagnosis not present

## 2020-03-04 DIAGNOSIS — I482 Chronic atrial fibrillation, unspecified: Secondary | ICD-10-CM | POA: Diagnosis not present

## 2020-03-04 DIAGNOSIS — Z87440 Personal history of urinary (tract) infections: Secondary | ICD-10-CM | POA: Diagnosis not present

## 2020-03-04 DIAGNOSIS — F0281 Dementia in other diseases classified elsewhere with behavioral disturbance: Secondary | ICD-10-CM | POA: Diagnosis not present

## 2020-03-04 DIAGNOSIS — E119 Type 2 diabetes mellitus without complications: Secondary | ICD-10-CM | POA: Diagnosis not present

## 2020-03-05 DIAGNOSIS — E119 Type 2 diabetes mellitus without complications: Secondary | ICD-10-CM | POA: Diagnosis not present

## 2020-03-05 DIAGNOSIS — I2510 Atherosclerotic heart disease of native coronary artery without angina pectoris: Secondary | ICD-10-CM | POA: Diagnosis not present

## 2020-03-05 DIAGNOSIS — G301 Alzheimer's disease with late onset: Secondary | ICD-10-CM | POA: Diagnosis not present

## 2020-03-05 DIAGNOSIS — I482 Chronic atrial fibrillation, unspecified: Secondary | ICD-10-CM | POA: Diagnosis not present

## 2020-03-05 DIAGNOSIS — F0281 Dementia in other diseases classified elsewhere with behavioral disturbance: Secondary | ICD-10-CM | POA: Diagnosis not present

## 2020-03-05 DIAGNOSIS — Z87440 Personal history of urinary (tract) infections: Secondary | ICD-10-CM | POA: Diagnosis not present

## 2020-03-06 DIAGNOSIS — Z87440 Personal history of urinary (tract) infections: Secondary | ICD-10-CM | POA: Diagnosis not present

## 2020-03-06 DIAGNOSIS — F0281 Dementia in other diseases classified elsewhere with behavioral disturbance: Secondary | ICD-10-CM | POA: Diagnosis not present

## 2020-03-06 DIAGNOSIS — I2510 Atherosclerotic heart disease of native coronary artery without angina pectoris: Secondary | ICD-10-CM | POA: Diagnosis not present

## 2020-03-06 DIAGNOSIS — E119 Type 2 diabetes mellitus without complications: Secondary | ICD-10-CM | POA: Diagnosis not present

## 2020-03-06 DIAGNOSIS — I482 Chronic atrial fibrillation, unspecified: Secondary | ICD-10-CM | POA: Diagnosis not present

## 2020-03-06 DIAGNOSIS — G301 Alzheimer's disease with late onset: Secondary | ICD-10-CM | POA: Diagnosis not present

## 2020-03-07 DIAGNOSIS — E119 Type 2 diabetes mellitus without complications: Secondary | ICD-10-CM | POA: Diagnosis not present

## 2020-03-07 DIAGNOSIS — I482 Chronic atrial fibrillation, unspecified: Secondary | ICD-10-CM | POA: Diagnosis not present

## 2020-03-07 DIAGNOSIS — F0281 Dementia in other diseases classified elsewhere with behavioral disturbance: Secondary | ICD-10-CM | POA: Diagnosis not present

## 2020-03-07 DIAGNOSIS — G301 Alzheimer's disease with late onset: Secondary | ICD-10-CM | POA: Diagnosis not present

## 2020-03-07 DIAGNOSIS — I2510 Atherosclerotic heart disease of native coronary artery without angina pectoris: Secondary | ICD-10-CM | POA: Diagnosis not present

## 2020-03-07 DIAGNOSIS — Z87440 Personal history of urinary (tract) infections: Secondary | ICD-10-CM | POA: Diagnosis not present

## 2020-03-08 DIAGNOSIS — I2510 Atherosclerotic heart disease of native coronary artery without angina pectoris: Secondary | ICD-10-CM | POA: Diagnosis not present

## 2020-03-08 DIAGNOSIS — I482 Chronic atrial fibrillation, unspecified: Secondary | ICD-10-CM | POA: Diagnosis not present

## 2020-03-08 DIAGNOSIS — G301 Alzheimer's disease with late onset: Secondary | ICD-10-CM | POA: Diagnosis not present

## 2020-03-08 DIAGNOSIS — F0281 Dementia in other diseases classified elsewhere with behavioral disturbance: Secondary | ICD-10-CM | POA: Diagnosis not present

## 2020-03-08 DIAGNOSIS — E119 Type 2 diabetes mellitus without complications: Secondary | ICD-10-CM | POA: Diagnosis not present

## 2020-03-08 DIAGNOSIS — Z87440 Personal history of urinary (tract) infections: Secondary | ICD-10-CM | POA: Diagnosis not present

## 2020-03-09 DIAGNOSIS — Z87440 Personal history of urinary (tract) infections: Secondary | ICD-10-CM | POA: Diagnosis not present

## 2020-03-09 DIAGNOSIS — G301 Alzheimer's disease with late onset: Secondary | ICD-10-CM | POA: Diagnosis not present

## 2020-03-09 DIAGNOSIS — I482 Chronic atrial fibrillation, unspecified: Secondary | ICD-10-CM | POA: Diagnosis not present

## 2020-03-09 DIAGNOSIS — I2510 Atherosclerotic heart disease of native coronary artery without angina pectoris: Secondary | ICD-10-CM | POA: Diagnosis not present

## 2020-03-09 DIAGNOSIS — E119 Type 2 diabetes mellitus without complications: Secondary | ICD-10-CM | POA: Diagnosis not present

## 2020-03-09 DIAGNOSIS — F0281 Dementia in other diseases classified elsewhere with behavioral disturbance: Secondary | ICD-10-CM | POA: Diagnosis not present

## 2020-04-03 DEATH — deceased
